# Patient Record
Sex: Male | Born: 1965
Health system: Southern US, Community
[De-identification: ages and names within clinical notes are randomized; demographics above are authoritative.]

## PROBLEM LIST (undated history)

## (undated) ENCOUNTER — Ambulatory Visit (HOSPITAL_COMMUNITY): Source: Home / Self Care

## (undated) DIAGNOSIS — I429 Cardiomyopathy, unspecified: Secondary | ICD-10-CM

## (undated) DIAGNOSIS — I219 Acute myocardial infarction, unspecified: Secondary | ICD-10-CM

## (undated) DIAGNOSIS — I671 Cerebral aneurysm, nonruptured: Secondary | ICD-10-CM

## (undated) DIAGNOSIS — I340 Nonrheumatic mitral (valve) insufficiency: Secondary | ICD-10-CM

## (undated) DIAGNOSIS — I428 Other cardiomyopathies: Secondary | ICD-10-CM

## (undated) DIAGNOSIS — R220 Localized swelling, mass and lump, head: Secondary | ICD-10-CM

## (undated) DIAGNOSIS — Q2112 Patent foramen ovale: Secondary | ICD-10-CM

## (undated) DIAGNOSIS — I639 Cerebral infarction, unspecified: Secondary | ICD-10-CM

## (undated) DIAGNOSIS — Q211 Atrial septal defect: Secondary | ICD-10-CM

## (undated) DIAGNOSIS — I63512 Cerebral infarction due to unspecified occlusion or stenosis of left middle cerebral artery: Secondary | ICD-10-CM

## (undated) DIAGNOSIS — Z720 Tobacco use: Secondary | ICD-10-CM

## (undated) DIAGNOSIS — R011 Cardiac murmur, unspecified: Secondary | ICD-10-CM

## (undated) DIAGNOSIS — I10 Essential (primary) hypertension: Secondary | ICD-10-CM

## (undated) DIAGNOSIS — I509 Heart failure, unspecified: Secondary | ICD-10-CM

## (undated) DIAGNOSIS — N182 Chronic kidney disease, stage 2 (mild): Secondary | ICD-10-CM

## (undated) HISTORY — PX: EXTERNAL EAR SURGERY: SHX627

## (undated) HISTORY — DX: Essential (primary) hypertension: I10

## (undated) HISTORY — DX: Cardiac murmur, unspecified: R01.1

## (undated) HISTORY — DX: Acute myocardial infarction, unspecified: I21.9

---

## 1997-11-13 ENCOUNTER — Emergency Department (HOSPITAL_COMMUNITY): Admission: EM | Admit: 1997-11-13 | Discharge: 1997-11-13 | Payer: Self-pay | Admitting: Emergency Medicine

## 1997-11-16 ENCOUNTER — Emergency Department (HOSPITAL_COMMUNITY): Admission: EM | Admit: 1997-11-16 | Discharge: 1997-11-16 | Payer: Self-pay | Admitting: *Deleted

## 1998-07-16 ENCOUNTER — Encounter: Payer: Self-pay | Admitting: Emergency Medicine

## 1998-07-16 ENCOUNTER — Emergency Department (HOSPITAL_COMMUNITY): Admission: EM | Admit: 1998-07-16 | Discharge: 1998-07-16 | Payer: Self-pay | Admitting: Emergency Medicine

## 2003-03-18 ENCOUNTER — Emergency Department (HOSPITAL_COMMUNITY): Admission: EM | Admit: 2003-03-18 | Discharge: 2003-03-18 | Payer: Self-pay | Admitting: *Deleted

## 2003-03-18 ENCOUNTER — Encounter: Payer: Self-pay | Admitting: *Deleted

## 2005-11-24 ENCOUNTER — Emergency Department (HOSPITAL_COMMUNITY): Admission: EM | Admit: 2005-11-24 | Discharge: 2005-11-24 | Payer: Self-pay | Admitting: Emergency Medicine

## 2013-04-12 ENCOUNTER — Encounter (HOSPITAL_COMMUNITY): Payer: Self-pay | Admitting: Emergency Medicine

## 2013-04-12 ENCOUNTER — Emergency Department (HOSPITAL_COMMUNITY)
Admission: EM | Admit: 2013-04-12 | Discharge: 2013-04-12 | Disposition: A | Discharge status: Home / Self Care | Payer: Self-pay | Attending: Emergency Medicine | Admitting: Emergency Medicine

## 2013-04-12 DIAGNOSIS — F17200 Nicotine dependence, unspecified, uncomplicated: Secondary | ICD-10-CM | POA: Insufficient documentation

## 2013-04-12 DIAGNOSIS — L0291 Cutaneous abscess, unspecified: Secondary | ICD-10-CM

## 2013-04-12 DIAGNOSIS — L0231 Cutaneous abscess of buttock: Secondary | ICD-10-CM | POA: Insufficient documentation

## 2013-04-12 MED ORDER — OXYCODONE-ACETAMINOPHEN 5-325 MG PO TABS: 1.0000 | ORAL_TABLET | Freq: Four times a day (QID) | ORAL | Status: DC | PRN

## 2013-04-12 MED ORDER — OXYCODONE-ACETAMINOPHEN 5-325 MG PO TABS
2.0000 | ORAL_TABLET | Freq: Once | ORAL | Status: AC
  Administered 2013-04-12: 2 via ORAL
  Filled 2013-04-12: qty 2

## 2013-04-12 MED ORDER — CLINDAMYCIN HCL 300 MG PO CAPS
300.0000 mg | ORAL_CAPSULE | Freq: Once | ORAL | Status: AC
  Administered 2013-04-12: 300 mg via ORAL
  Filled 2013-04-12 (×2): qty 1

## 2013-04-12 MED ORDER — CLINDAMYCIN HCL 150 MG PO CAPS: 150.0000 mg | ORAL_CAPSULE | Freq: Four times a day (QID) | ORAL | Status: DC

## 2013-04-12 MED ORDER — ONDANSETRON 4 MG PO TBDP
4.0000 mg | ORAL_TABLET | Freq: Once | ORAL | Status: AC
  Administered 2013-04-12: 4 mg via ORAL
  Filled 2013-04-12: qty 1

## 2013-04-12 NOTE — ED Provider Notes (Signed)
CSN: [id number]     Arrival date & time 04/12/13  1431 History   First MD Initiated Contact with Patient 04/12/13 1733     Chief Complaint  Patient presents with  . Abscess   (Consider location/radiation/quality/duration/timing/severity/associated sxs/prior Treatment) HPI  Lillard J Medlen is a 47 y.o.male without any significant PMH presents to the ER with complaints of abscess to left glutaeus.  He has had it for 1 week and delayed presentation because he does not know what it is or how he got it. The abscess is open and actively draining blood and puss. He says he is otherwise healthy without diabetes or HIV. He denies having fevers, nausea, vomiting, diarrhea.    History reviewed. No pertinent past medical history. History reviewed. No pertinent past surgical history. History reviewed. No pertinent family history. History  Substance Use Topics  . Smoking status: Current Every Day Smoker  . Smokeless tobacco: Not on file  . Alcohol Use: No    Review of Systems The patient denies anorexia, fever, weight loss,, vision loss, decreased hearing, hoarseness, chest pain, syncope, dyspnea on exertion, peripheral edema, balance deficits, hemoptysis, abdominal pain, melena, hematochezia, severe indigestion/heartburn, hematuria, incontinence, genital sores, muscle weakness, suspicious skin lesions, transient blindness, difficulty walking, depression, unusual weight change, abnormal bleeding, enlarged lymph nodes, angioedema, and breast masses.  Allergies  Review of patient's allergies indicates no known allergies.  Home Medications  No current outpatient prescriptions on file. BP 149/91  Pulse 85  Temp(Src) 98.3 F (36.8 C)  Resp 18  Ht 5\' 11"  (1.803 m)  Wt 160 lb (72.576 kg)  BMI 22.33 kg/m2  SpO2 100% Physical Exam  Nursing note and vitals reviewed. Constitutional: He appears well-developed and well-nourished. No distress.  HENT:  Head: Normocephalic and atraumatic.  Eyes:  Pupils are equal, round, and reactive to light.  Neck: Normal range of motion. Neck supple.  Cardiovascular: Normal rate and regular rhythm.   Pulmonary/Chest: Effort normal.  Abdominal: Soft.  Genitourinary:     Neurological: He is alert.  Skin: Skin is warm and dry.    ED Course  Procedures (including critical care time) Labs Review Labs Reviewed - No data to display Imaging Review No results found.  EKG Interpretation   None       MDM   1. Abscess    Puss expressed through open abscess. Will give Rx for clindamycin and percocet.  Patient is not having symptoms of being a diabetic, weight loss, fevers/chills, urinary frequency. He is not febrile.  47 y.o.Daud J Hendricks's evaluation in the Emergency Department is complete. It has been determined that no acute conditions requiring further emergency intervention are present at this time. The patient/guardian have been advised of the diagnosis and plan. We have discussed signs and symptoms that warrant return to the ED, such as changes or worsening in symptoms.  Vital signs are stable at discharge. Filed Vitals:   04/12/13 1727  BP: 149/91  Pulse: 85  Temp:   Resp:     Patient/guardian has voiced understanding and agreed to follow-up with the PCP or specialist.     Dorthula Matas, PA-C 04/12/13 1818

## 2013-04-12 NOTE — ED Notes (Signed)
Per pt sts abscess in rectal area that is draining and bleeding.

## 2013-04-16 NOTE — ED Provider Notes (Signed)
Medical screening examination/treatment/procedure(s) were performed by non-physician practitioner and as supervising physician I was immediately available for consultation/collaboration.  EKG Interpretation   None        Eiko Mcgowen, MD 04/16/13 0909 

## 2014-01-15 ENCOUNTER — Telehealth: Payer: Self-pay

## 2014-09-26 ENCOUNTER — Encounter (HOSPITAL_COMMUNITY): Payer: Self-pay | Admitting: *Deleted

## 2014-09-26 ENCOUNTER — Emergency Department (INDEPENDENT_AMBULATORY_CARE_PROVIDER_SITE_OTHER)
Admission: EM | Admit: 2014-09-26 | Discharge: 2014-09-26 | Disposition: A | Discharge status: Home / Self Care | Payer: Self-pay | Source: Home / Self Care | Attending: Family Medicine | Admitting: Family Medicine

## 2014-09-26 DIAGNOSIS — L0231 Cutaneous abscess of buttock: Secondary | ICD-10-CM

## 2014-09-26 MED ORDER — SULFAMETHOXAZOLE-TRIMETHOPRIM 800-160 MG PO TABS: 1.0000 | ORAL_TABLET | Freq: Two times a day (BID) | ORAL | Status: AC

## 2014-09-26 NOTE — Discharge Instructions (Signed)
Warm tub soak 1-2 times a day, leave packing in place, return on sun for removal. Take antibiotic.

## 2014-09-26 NOTE — ED Notes (Signed)
Pt is here with complaints of boil on buttocks.

## 2014-09-26 NOTE — ED Provider Notes (Signed)
CSN: [id number]     Arrival date & time 09/26/14  1018 History   First MD Initiated Contact with Patient 09/26/14 1035     Chief Complaint  Patient presents with  . Recurrent Skin Infections   (Consider location/radiation/quality/duration/timing/severity/associated sxs/prior Treatment) Patient is a 49 y.o. male presenting with abscess. The history is provided by the patient and a friend.  Abscess Location:  Ano-genital Ano-genital abscess location:  L buttock Abscess quality: fluctuance, induration and painful   Red streaking: no   Duration:  5 days Progression:  Worsening Pain details:    Quality:  Throbbing   Progression:  Worsening Chronicity:  Chronic Associated symptoms: fever and nausea     History reviewed. No pertinent past medical history. Past Surgical History  Procedure Laterality Date  . External ear surgery     History reviewed. No pertinent family history. History  Substance Use Topics  . Smoking status: Current Every Day Smoker  . Smokeless tobacco: Not on file  . Alcohol Use: No    Review of Systems  Constitutional: Positive for fever.  Gastrointestinal: Positive for nausea.  Skin: Positive for rash.    Allergies  Review of patient's allergies indicates no known allergies.  Home Medications   Prior to Admission medications   Medication Sig Start Date End Date Taking? Authorizing Provider  clindamycin (CLEOCIN) 150 MG capsule Take 1 capsule (150 mg total) by mouth every 6 (six) hours. 04/12/13   Marlon Pel, PA-C  oxyCODONE-acetaminophen (PERCOCET/ROXICET) 5-325 MG per tablet Take 1 tablet by mouth every 6 (six) hours as needed for severe pain. 04/12/13   Tiffany Neva Seat, PA-C  sulfamethoxazole-trimethoprim (BACTRIM DS,SEPTRA DS) 800-160 MG per tablet Take 1 tablet by mouth 2 (two) times daily. 09/26/14 10/03/14  Linna Hoff, MD   BP 135/88 mmHg  Pulse 99  Temp(Src) 98.6 F (37 C) (Oral)  Resp 16  SpO2 100% Physical Exam  Constitutional: He  appears well-developed and well-nourished. He appears distressed.  Neurological: He is alert.  Skin: Skin is warm and dry.  Tender left gluteal abscess  Nursing note and vitals reviewed.   ED Course  INCISION AND DRAINAGE Date/Time: 09/26/2014 11:08 AM Performed by: Linna Hoff Authorized by: Bradd Canary D Consent: Verbal consent obtained. Consent given by: patient Type: abscess Body area: anogenital Location details: perianal Local anesthetic: topical anesthetic Patient sedated: no Scalpel size: 11 Incision type: single straight Complexity: simple Drainage: purulent Drainage amount: copious Wound treatment: drain placed Packing material: 1/4 in iodoform gauze Patient tolerance: Patient tolerated the procedure well with no immediate complications Comments: Culture obtained.   (including critical care time) Labs Review Labs Reviewed  CULTURE, ROUTINE-ABSCESS    Imaging Review No results found.   MDM   1. Abscess, gluteal, left    Left gluteal abscess i+d with packing.    Linna Hoff, MD 09/26/14 1116

## 2014-09-28 ENCOUNTER — Emergency Department (INDEPENDENT_AMBULATORY_CARE_PROVIDER_SITE_OTHER)
Admission: EM | Admit: 2014-09-28 | Discharge: 2014-09-28 | Disposition: A | Discharge status: Home / Self Care | Payer: Self-pay | Source: Home / Self Care

## 2014-09-28 ENCOUNTER — Encounter (HOSPITAL_COMMUNITY): Payer: Self-pay | Admitting: *Deleted

## 2014-09-28 DIAGNOSIS — Z09 Encounter for follow-up examination after completed treatment for conditions other than malignant neoplasm: Secondary | ICD-10-CM

## 2014-09-28 NOTE — ED Notes (Signed)
Pt presents for follow-up S/P I&D buttock abscess 2 days ago.  States packing fell out.  Feels area is getting less swollen.  Has been taking abx as directed.

## 2014-09-28 NOTE — Discharge Instructions (Signed)
Continue care and treatment of incision as you have been.  It should heal nicely on its own.  If you develop any fever or foul smelling draining return for further evaluation.  Dressing Change A dressing is a material placed over wounds. It keeps the wound clean, dry, and protected from further injury. This provides an environment that favors wound healing.  BEFORE YOU BEGIN  Get your supplies together. Things you may need include:  Saline solution.  Flexible gauze dressing.  Medicated cream.  Tape.  Gloves.  Abdominal dressing pads.  Gauze squares.  Plastic bags.  Take pain medicine 30 minutes before the dressing change if you need it.  Take a shower before you do the first dressing change of the day. Use plastic wrap or a plastic bag to prevent the dressing from getting wet. REMOVING YOUR OLD DRESSING   Wash your hands with soap and water. Dry your hands with a clean towel.  Put on your gloves.  Remove any tape.  Carefully remove the old dressing. If the dressing sticks, you may dampen it with warm water to loosen it, or follow your caregiver's specific directions.  Remove any gauze or packing tape that is in your wound.  Take off your gloves.  Put the gloves, tape, gauze, or any packing tape into a plastic bag. CHANGING YOUR DRESSING  Open the supplies.  Take the cap off the saline solution.  Open the gauze package so that the gauze remains on the inside of the package.  Put on your gloves.  Clean your wound as told by your caregiver.  If you have been told to keep your wound dry, follow those instructions.  Your caregiver may tell you to do one or more of the following:  Pick up the gauze. Pour the saline solution over the gauze. Squeeze out the extra saline solution.  Put medicated cream or other medicine on your wound if you have been told to do so.  Put the solution soaked gauze only in your wound, not on the skin around it.  Pack your wound  loosely or as told by your caregiver.  Put dry gauze on your wound.  Put abdominal dressing pads over the dry gauze if your wet gauze soaks through.  Tape the abdominal dressing pads in place so they will not fall off. Do not wrap the tape completely around the affected part (arm, leg, abdomen).  Wrap the dressing pads with a flexible gauze dressing to secure it in place.  Take off your gloves. Put them in the plastic bag with the old dressing. Tie the bag shut and throw it away.  Keep the dressing clean and dry until your next dressing change.  Wash your hands. SEEK MEDICAL CARE IF:  Your skin around the wound looks red.  Your wound feels more tender or sore.  You see pus in the wound.  Your wound smells bad.  You have a fever.  Your skin around the wound has a rash that itches and burns.  You see black or yellow skin in your wound that was not there before.  You feel nauseous, throw up, and feel very tired. Document Released: 06/30/2004 Document Revised: 08/15/2011 Document Reviewed: 04/04/2011 [facility] Patient Information 2015 [place], [place]. This information is not intended to replace advice given to you by your health care provider. Make sure you discuss any questions you have with your health care provider.

## 2014-09-28 NOTE — ED Provider Notes (Signed)
CSN: [id number]     Arrival date & time 09/28/14  3202 History   None    Chief Complaint  Patient presents with  . Follow-up   (Consider location/radiation/quality/duration/timing/severity/associated sxs/prior Treatment)  HPI   The patient is a male presenting today for wound recheck following an incision and drainage of an abscess on his buttock 2 days ago by Dr. Penni Bombard. Patient states he's been taking his antibiotic does not believe he has had any fevers but occasionally says he gets the sweats. He states it feels "much better. Has been keeping some 4 x 4's between his gluteal cleft for any drainage as denied any further pus since initial drainage. The patient states the wound packing came out on its own.  History reviewed. No pertinent past medical history. Past Surgical History  Procedure Laterality Date  . External ear surgery     No family history on file. History  Substance Use Topics  . Smoking status: Current Every Day Smoker  . Smokeless tobacco: Not on file  . Alcohol Use: No    Review of Systems  Constitutional: Positive for diaphoresis. Negative for fever and fatigue.  HENT: Negative.   Eyes: Negative.   Respiratory: Negative.   Cardiovascular: Negative.   Gastrointestinal: Negative.   Endocrine: Negative.   Genitourinary: Negative.   Musculoskeletal: Negative.   Skin: Positive for wound. Negative for color change, pallor and rash.  Allergic/Immunologic: Negative.   Neurological: Negative.   Hematological: Negative.   Psychiatric/Behavioral: Negative.     Allergies  Review of patient's allergies indicates no known allergies.  Home Medications   Prior to Admission medications   Medication Sig Start Date End Date Taking? Authorizing Provider  sulfamethoxazole-trimethoprim (BACTRIM DS,SEPTRA DS) 800-160 MG per tablet Take 1 tablet by mouth 2 (two) times daily. 09/26/14 10/03/14 Yes Linna Hoff, MD  clindamycin (CLEOCIN) 150 MG capsule Take 1 capsule (150  mg total) by mouth every 6 (six) hours. 04/12/13   Marlon Pel, PA-C  oxyCODONE-acetaminophen (PERCOCET/ROXICET) 5-325 MG per tablet Take 1 tablet by mouth every 6 (six) hours as needed for severe pain. 04/12/13   Tiffany Neva Seat, PA-C   BP 120/81 mmHg  Pulse 114  Temp(Src) 98.4 F (36.9 C) (Oral)  Resp 16  SpO2 99%   Physical Exam  Constitutional: He appears well-developed and well-nourished. No distress.  Cardiovascular: Normal rate, normal heart sounds and intact distal pulses.  Exam reveals no gallop and no friction rub.   No murmur heard. Pulmonary/Chest: Effort normal and breath sounds normal. No respiratory distress. He has no wheezes. He has no rales. He exhibits no tenderness.  Skin: He is not diaphoretic.  Incisional area is healing nicely. Area is nonfluctuant and firm. Unable to express any purulent drainage.  A small amount of serosanguineous drainage still present and there is no evidence of wound packing present. No erythema or heat noticed in surrounding healing incision.  Nursing note and vitals reviewed.   ED Course  Procedures (including critical care time) Labs Review Labs Reviewed - No data to display  Imaging Review No results found.   MDM   1. Encounter for recheck of abscess following incision and drainage    The patient is to continue as he is doing with wound care and management. Patient advised return should any signs or symptoms of fever and infection develop. The patient verbalizes understanding and agrees to plan of care.        Servando Salina, NP 09/28/14 1134

## 2014-09-30 LAB — CULTURE, ROUTINE-ABSCESS: Special Requests: NORMAL

## 2015-09-01 ENCOUNTER — Encounter (HOSPITAL_COMMUNITY): Payer: Self-pay | Admitting: Emergency Medicine

## 2015-09-01 ENCOUNTER — Emergency Department (HOSPITAL_COMMUNITY)
Admission: EM | Admit: 2015-09-01 | Discharge: 2015-09-01 | Disposition: A | Discharge status: Home / Self Care | Payer: Self-pay | Attending: Emergency Medicine | Admitting: Emergency Medicine

## 2015-09-01 DIAGNOSIS — L0291 Cutaneous abscess, unspecified: Secondary | ICD-10-CM

## 2015-09-01 DIAGNOSIS — F17200 Nicotine dependence, unspecified, uncomplicated: Secondary | ICD-10-CM | POA: Insufficient documentation

## 2015-09-01 DIAGNOSIS — L0231 Cutaneous abscess of buttock: Secondary | ICD-10-CM | POA: Insufficient documentation

## 2015-09-01 DIAGNOSIS — Z792 Long term (current) use of antibiotics: Secondary | ICD-10-CM | POA: Insufficient documentation

## 2015-09-01 MED ORDER — SULFAMETHOXAZOLE-TRIMETHOPRIM 800-160 MG PO TABS: 1.0000 | ORAL_TABLET | Freq: Two times a day (BID) | ORAL | Status: AC

## 2015-09-01 MED ORDER — LIDOCAINE HCL (PF) 1 % IJ SOLN
20.0000 mL | Freq: Once | INTRAMUSCULAR | Status: AC
  Administered 2015-09-01: 20 mL via INTRADERMAL
  Filled 2015-09-01: qty 20

## 2015-09-01 NOTE — ED Notes (Signed)
Pt states "I have a boil on my butt, last time they squeezed it out, its been going on about a week".

## 2015-09-01 NOTE — ED Provider Notes (Signed)
CSN: [id number]     Arrival date & time 09/01/15  1603 History  By signing my name below, I, David [name], attest that this documentation has been prepared under the direction and in the presence of [name], PA-C.  Electronically Signed: [name], ED Scribe 09/01/2015 at 4:58 PM.  Chief Complaint  Patient presents with  . Abscess    The history is provided by the patient. No language interpreter was used.    HPI Comments: David [name] is a 50 y.o. male who presents to the Emergency Department complaining of gradual onset, abscess on his left buttock, onset about one week ago. Pt reports associated pain to the area. The pain is worsened when he sits or puts pressure on the area. No other worsening or alleviating factors noted. Pt denies fever, chills, nausea, vomiting, abdominal pain, drainage, or any other pertinent symptoms.   History reviewed. No pertinent past medical history. Past Surgical History  Procedure Laterality Date  . External ear surgery     No family history on file. Social History  Substance Use Topics  . Smoking status: Current Every Day Smoker  . Smokeless tobacco: None  . Alcohol Use: No      Review of Systems  Constitutional: Negative for fever and chills.  Gastrointestinal: Negative for nausea, vomiting and abdominal pain.  Skin:       Abscess    Allergies  Review of patient's allergies indicates no known allergies.  Home Medications   Prior to Admission medications   Medication Sig Start Date End Date Taking? Authorizing Provider  clindamycin (CLEOCIN) 150 MG capsule Take 1 capsule (150 mg total) by mouth every 6 (six) hours. 04/12/13   Marlon Pel, PA-C  oxyCODONE-acetaminophen (PERCOCET/ROXICET) 5-325 MG per tablet Take 1 tablet by mouth every 6 (six) hours as needed for severe pain. 04/12/13   Tiffany Neva Seat, PA-C    BP 157/89 mmHg  Pulse 88  Temp(Src) 99.1 F (37.3 C) (Oral)  Resp 18  SpO2 97% Physical Exam   Constitutional: He is oriented to person, place, and time. He appears well-developed and well-nourished. No distress.  HENT:  Head: Normocephalic and atraumatic.  Right Ear: External ear normal.  Left Ear: External ear normal.  Nose: Nose normal.  Eyes: Conjunctivae and EOM are normal. Right eye exhibits no discharge. Left eye exhibits no discharge. No scleral icterus.  Neck: Normal range of motion. Neck supple.  Cardiovascular: Normal rate and regular rhythm.   Pulmonary/Chest: Effort normal and breath sounds normal. No respiratory distress.  Genitourinary:     2 cm area of fluctuance to left buttock with surrounding induration.  Musculoskeletal: Normal range of motion. He exhibits no edema or tenderness.  Neurological: He is alert and oriented to person, place, and time.  Skin: Skin is warm and dry. He is not diaphoretic.  Psychiatric: He has a normal mood and affect. His behavior is normal.  Nursing note and vitals reviewed.   ED Course  Procedures (including critical care time)  DIAGNOSTIC STUDIES: Oxygen Saturation is 97% on RA, normal by my interpretation.    COORDINATION OF CARE: 4:35 PM-Discussed treatment plan which includes incision and drainage with pt at bedside and pt agreed to plan.   INCISION AND DRAINAGE Performed by: Dorise Hiss. Deontez Klinke, PA-C. Consent: verbal consent obtained. Risks and benefits: risks, benefits and alternatives were discussed Sterile Prep and Drape Type: abscess Body area: left buttock Anesthesia:  Local anesthetic: lidocaine 1% without epinephrine Anesthetic total: 20 ml Incision:  11 blade Complexity: complex Blunt dissection to breakup loculations Drainage: purulent  Drainage amount: moderate  Flushed with copious amount of sterile saline Patient tolerance: patient tolerated the procedure well with no immediate complications.   Labs Review Labs Reviewed - No data to display  Imaging Review No results found.    EKG  Interpretation None      MDM   Final diagnoses:  Abscess    50 year old male presents with abscess to his left buttock x 1 week. Reports pain. Denies drainage. Patient is afebrile. Vital signs stable. On exam, patient has a 2 cm area of fluctuance with surrounding induration to his left buttock. Incision and drainage performed in the ED, which the patient tolerated well. Will discharge with bactrim. Advised to use warm compresses. Patient to follow-up in 2 days for wound recheck. Return precautions discussed. Patient verbalizes his understanding and is in agreement with plan.  BP 157/89 mmHg  Pulse 88  Temp(Src) 99.1 F (37.3 C) (Oral)  Resp 18  SpO2 97%  I personally performed the services described in this documentation, which was scribed in my presence. The recorded information has been reviewed and is accurate.    [name], PA-C 09/01/15 1701  [name], MD 09/01/15 2018

## 2015-09-01 NOTE — Discharge Instructions (Signed)
1. Medications: bactrim, usual home medications 2. Treatment: rest, drink plenty of fluids, use warm compresses 3. Follow Up: please followup with this department in 2 days for wound recheck and for discussion of your diagnoses and further evaluation after today's visit; if you do not have a primary care doctor use the phone number listed in your discharge paperwork to find one; please return to the ER for fever, chills, increased pain/redness/swelling, new or worsening symptoms   Abscess An abscess (boil or furuncle) is an infected area on or under the skin. This area is filled with yellowish-white fluid (pus) and other material (debris). HOME CARE   Only take medicines as told by your doctor.  If you were given antibiotic medicine, take it as directed. Finish the medicine even if you start to feel better.  If gauze is used, follow your doctor's directions for changing the gauze.  To avoid spreading the infection:  Keep your abscess covered with a bandage.  Wash your hands well.  Do not share personal care items, towels, or whirlpools with others.  Avoid skin contact with others.  Keep your skin and clothes clean around the abscess.  Keep all doctor visits as told. GET HELP RIGHT AWAY IF:   You have more pain, puffiness (swelling), or redness in the wound site.  You have more fluid or blood coming from the wound site.  You have muscle aches, chills, or you feel sick.  You have a fever. MAKE SURE YOU:   Understand these instructions.  Will watch your condition.  Will get help right away if you are not doing well or get worse.   This information is not intended to replace advice given to you by your health care provider. Make sure you discuss any questions you have with your health care provider.   Document Released: 11/09/2007 Document Revised: 11/22/2011 Document Reviewed: 08/06/2011 Elsevier Interactive Patient Education [employer]

## 2016-08-27 ENCOUNTER — Encounter (HOSPITAL_COMMUNITY): Payer: Self-pay | Admitting: Emergency Medicine

## 2016-08-27 ENCOUNTER — Emergency Department (HOSPITAL_COMMUNITY)
Admission: EM | Admit: 2016-08-27 | Discharge: 2016-08-27 | Disposition: A | Discharge status: Home / Self Care | Payer: BLUE CROSS/BLUE SHIELD | Attending: Emergency Medicine | Admitting: Emergency Medicine

## 2016-08-27 DIAGNOSIS — Z79899 Other long term (current) drug therapy: Secondary | ICD-10-CM | POA: Diagnosis not present

## 2016-08-27 DIAGNOSIS — M791 Myalgia: Secondary | ICD-10-CM | POA: Diagnosis present

## 2016-08-27 DIAGNOSIS — F17200 Nicotine dependence, unspecified, uncomplicated: Secondary | ICD-10-CM | POA: Diagnosis not present

## 2016-08-27 DIAGNOSIS — L0501 Pilonidal cyst with abscess: Secondary | ICD-10-CM | POA: Diagnosis not present

## 2016-08-27 MED ORDER — HYDROCODONE-ACETAMINOPHEN 5-325 MG PO TABS: 1.0000 | ORAL_TABLET | ORAL | 0 refills | Status: DC | PRN

## 2016-08-27 MED ORDER — LIDOCAINE HCL 2 % IJ SOLN
10.0000 mL | Freq: Once | INTRAMUSCULAR | Status: AC
  Administered 2016-08-27: 200 mg
  Filled 2016-08-27: qty 20

## 2016-08-27 MED ORDER — CEPHALEXIN 500 MG PO CAPS: 500.0000 mg | ORAL_CAPSULE | Freq: Four times a day (QID) | ORAL | 0 refills | Status: DC

## 2016-08-27 NOTE — ED Triage Notes (Signed)
Onset 2-3 days ago developed a "boil" left buttocks. States pain increasing along with size. Currently 7/10 achy sore.

## 2016-08-27 NOTE — ED Notes (Signed)
Declined W/C at D/C and was escorted to lobby by RN. 

## 2016-08-27 NOTE — ED Triage Notes (Signed)
PT provided with warm blankets but refused.

## 2016-08-27 NOTE — ED Provider Notes (Signed)
[facility]EMERGENCY DEPT Provider Note   CSN: [id number] Arrival date & time: 08/27/16  0945   By signing my name below, I, Marnette Burgess Long, attest that this documentation has been prepared under the direction and in the presence of Burgess Amor, PA-C. Electronically Signed: Marnette Burgess Long, Scribe. 08/27/2016. 11:06 AM.   History   Chief Complaint Chief Complaint  Patient presents with  . Recurrent Skin Infections   The history is provided by the patient and medical records. No language interpreter was used.   HPI Comments: David [name] is a 51 y.o. male with no pertinent PMHx, who presents to the Emergency Department complaining of a moderate, gradually worsening area of 7/10, aching pain with swelling to the upper left buttocks onset about three to four days ago. He reports he has had this type of "boil" before in a similar area that was drained at that time. Per chart review, he has three past I&D's for abscesses in  the same area on 04/12/13, 09/26/14, and 09/01/15. Pt states pain is exacerbated while sitting and with palpation and direct pressure. He notes he is quite anxious at the moment as hospitals scare him. He tried soap, water, and epsom salt at home with no relief of the swelling. Denies fever, chills, drainage from the area. Pt is a current every day smoker. Pt does not currently have a PCP.   History reviewed. No pertinent past medical history.  There are no active problems to display for this patient.  Past Surgical History:  Procedure Laterality Date  . EXTERNAL EAR SURGERY      Home Medications    Prior to Admission medications   Medication Sig Start Date End Date Taking? Authorizing Provider  cephALEXin (KEFLEX) 500 MG capsule Take 1 capsule (500 mg total) by mouth 4 (four) times daily. 08/27/16   Burgess Amor, PA-C  clindamycin (CLEOCIN) 150 MG capsule Take 1 capsule (150 mg total) by mouth every 6 (six) hours. 04/12/13   Marlon Pel, PA-C  HYDROcodone-acetaminophen  (NORCO/VICODIN) 5-325 MG tablet Take 1 tablet by mouth every 4 (four) hours as needed for moderate pain. 08/27/16   Burgess Amor, PA-C  oxyCODONE-acetaminophen (PERCOCET/ROXICET) 5-325 MG per tablet Take 1 tablet by mouth every 6 (six) hours as needed for severe pain. 04/12/13   Marlon Pel, PA-C    Family History No family history on file.  Social History Social History  Substance Use Topics  . Smoking status: Current Every Day Smoker  . Smokeless tobacco: Never Used  . Alcohol use No     Allergies   Patient has no known allergies.   Review of Systems Review of Systems  Constitutional: Negative for chills and fever.  HENT: Negative.   Gastrointestinal: Negative for abdominal pain, nausea and vomiting.  Musculoskeletal: Negative for myalgias.  Skin:       Boil to left buttocks  Neurological: Negative for dizziness.     Physical Exam Updated Vital Signs BP (!) 147/75 (BP Location: Left Arm)   Pulse 69   Temp 98.3 F (36.8 C)   Resp 17   Ht 5\' 10"  (1.778 m)   Wt 77.1 kg   SpO2 100%   BMI 24.39 kg/m   Physical Exam  Constitutional: He is oriented to person, place, and time. He appears well-developed and well-nourished.  HENT:  Head: Normocephalic.  Eyes: Conjunctivae are normal.  Cardiovascular: Normal rate.   Pulmonary/Chest: Effort normal.  Abdominal: He exhibits no distension.  Musculoskeletal: Normal range of motion.  Neurological: He is alert and oriented to person, place, and time.  Skin: Skin is warm and dry.  2cm fluctuant abscess at his left upper buttock near the midline. There is no surrounding erythema or red streaking. No drainage.   Psychiatric: He has a normal mood and affect.  Nursing note and vitals reviewed.   ED Treatments / Results  DIAGNOSTIC STUDIES:  Oxygen Saturation is 99% on RA, normal by my interpretation.    COORDINATION OF CARE:  11:02 AM Discussed treatment plan with pt at bedside including I&D and pt agreed to  plan.  Labs (all labs ordered are listed, but only abnormal results are displayed) Labs Reviewed - No data to display  EKG  EKG Interpretation None       Radiology No results found.  Procedures Procedures  INCISION AND DRAINAGE PROCEDURE NOTE: Patient identification was confirmed and verbal consent was obtained. This procedure was performed by Burgess Amor, PA-C at 11:25 AM. Site: Upper left buttock near midline Sterile procedures observed with betadine Needle size: 25gauge Anesthetic used (type and amt): 2% Lidocaine without epi, 2cc Blade size: 11 Drainage: Copious amount of purulent drainage Complexity: Complex Site anesthetized, incision made over site, wound drained and explored loculations, rinsed with copious amounts of normal saline, covered with dry, sterile dressing.  Pt tolerated procedure well without complications.  Instructions for care discussed verbally and pt provided with additional written instructions for homecare and f/u.   Medications Ordered in ED Medications  lidocaine (XYLOCAINE) 2 % (with pres) injection 200 mg (200 mg Other Given 08/27/16 1111)     Initial Impression / Assessment and Plan / ED Course  I have reviewed the triage vital signs and the nursing notes.  Pertinent labs & imaging results that were available during my care of the patient were reviewed by me and considered in my medical decision making (see chart for details).     Patient with skin abscess. Incision and drainage performed in the ED today.  Abscess was not large enough to warrant packing or drain placement. Wound recheck in 2 days. Supportive care and return precautions discussed.  Pt sent home with keflex, hydrocodone. The patient appears reasonably screened and/or stabilized for discharge and I doubt any other emergent medical condition requiring further screening, evaluation, or treatment in the ED prior to discharge.    Final Clinical Impressions(s) / ED Diagnoses    Final diagnoses:  Pilonidal cyst with abscess    New Prescriptions Discharge Medication List as of 08/27/2016 11:57 AM    START taking these medications   Details  cephALEXin (KEFLEX) 500 MG capsule Take 1 capsule (500 mg total) by mouth 4 (four) times daily., Starting Sat 08/27/2016, Print    HYDROcodone-acetaminophen (NORCO/VICODIN) 5-325 MG tablet Take 1 tablet by mouth every 4 (four) hours as needed for moderate pain., Starting Sat 08/27/2016, Print       I personally performed the services described in this documentation, which was scribed in my presence. The recorded information has been reviewed and is accurate.     Burgess Amor, PA-C 08/27/16 1230    Canary Brim Tegeler, MD 08/27/16 2019

## 2016-08-27 NOTE — Discharge Instructions (Signed)
Continue to do a warm water soak at least twice daily for 10 minutes to keep this site open and draining.  Complete your entire course of antibiotics.  Get rechecked for any new or worsening symptoms.  You may take the hydrocodone prescribed for pain relief.  This will make you drowsy - do not drive within 4 hours of taking this medication.

## 2016-12-04 ENCOUNTER — Emergency Department (HOSPITAL_COMMUNITY)
Admission: EM | Admit: 2016-12-04 | Discharge: 2016-12-04 | Disposition: A | Discharge status: Home / Self Care | Payer: BLUE CROSS/BLUE SHIELD | Attending: Emergency Medicine | Admitting: Emergency Medicine

## 2016-12-04 ENCOUNTER — Encounter (HOSPITAL_COMMUNITY): Payer: Self-pay | Admitting: *Deleted

## 2016-12-04 DIAGNOSIS — L84 Corns and callosities: Secondary | ICD-10-CM | POA: Insufficient documentation

## 2016-12-04 DIAGNOSIS — M79671 Pain in right foot: Secondary | ICD-10-CM | POA: Insufficient documentation

## 2016-12-04 NOTE — ED Provider Notes (Signed)
[facility] Provider Note    By signing my name below, I, Earmon Phoenix, attest that this documentation has been prepared under the direction and in the presence of Graciella Freer, PA-C. Electronically Signed: Earmon Phoenix, ED Scribe. 12/04/16. 10:21 AM.    History   Chief Complaint Chief Complaint  Patient presents with  . Foot Pain    The history is provided by the patient and medical records. No language interpreter was used.    David [name] is a 51 y.o. male who presents to the Emergency Department complaining of lateral right foot pain that began several weeks ago. He states he used to wear steel toed boots that started his pain. He was told by a shoe salesman that he needed to wear wider shoes. He states he recently switched to wearing wide width shoes yesterday so he is unsure of any relief from that. He has been taking a previously prescribed pain reliever with moderate relief. He denies any trauma or injury. Walking and bearing weight increases the pain He denies numbness, tingling or weakness of the right foot, leg or toes, bruising, wounds, numbness, tingling or weakness. He denies trauma, injury or fall.    History reviewed. No pertinent past medical history.  There are no active problems to display for this patient.   Past Surgical History:  Procedure Laterality Date  . EXTERNAL EAR SURGERY         Home Medications    Prior to Admission medications   Medication Sig Start Date End Date Taking? Authorizing Provider  cephALEXin (KEFLEX) 500 MG capsule Take 1 capsule (500 mg total) by mouth 4 (four) times daily. 08/27/16   Burgess Amor, PA-C  clindamycin (CLEOCIN) 150 MG capsule Take 1 capsule (150 mg total) by mouth every 6 (six) hours. 04/12/13   Marlon Pel, PA-C  HYDROcodone-acetaminophen (NORCO/VICODIN) 5-325 MG tablet Take 1 tablet by mouth every 4 (four) hours as needed for moderate pain. 08/27/16   Burgess Amor, PA-C  oxyCODONE-acetaminophen  (PERCOCET/ROXICET) 5-325 MG per tablet Take 1 tablet by mouth every 6 (six) hours as needed for severe pain. 04/12/13   Marlon Pel, PA-C    Family History History reviewed. No pertinent family history.  Social History Social History  Substance Use Topics  . Smoking status: Current Every Day Smoker  . Smokeless tobacco: Never Used  . Alcohol use No     Allergies   Patient has no known allergies.   Review of Systems Review of Systems  Musculoskeletal: Positive for arthralgias.  Skin: Negative for color change and wound.  Neurological: Negative for weakness and numbness.     Physical Exam Updated Vital Signs BP (!) 154/93 (BP Location: Right Arm)   Pulse 71   Temp 98.4 F (36.9 C) (Oral)   Resp 18   SpO2 100%   Physical Exam  Constitutional: He appears well-developed and well-nourished.  Appears anxious but in no acute distress.   HENT:  Head: Normocephalic and atraumatic.  Eyes: Conjunctivae and EOM are normal. Right eye exhibits no discharge. Left eye exhibits no discharge. No scleral icterus.  Cardiovascular:  Dorsalis Pedis pulses 2+ bilaterally.  Pulmonary/Chest: Effort normal.  Musculoskeletal:  Full ROM of right ankle and toes of right foot. TTP to the lateral aspect just below 5th digit where there is an obvious callused area. Tenderness palpation overlying the callused area of the lateral foot. No surrounding warmth or erythema.  Neurological: He is alert.  Skin: Skin is warm and dry. Capillary refill  takes less than 2 seconds.  Right foot and ankle has no dusky appearance and is not cool to touch. No open wounds, abrasions or lacerations.   Psychiatric: He has a normal mood and affect. His speech is normal and behavior is normal.  Nursing note and vitals reviewed.    ED Treatments / Results  DIAGNOSTIC STUDIES: Oxygen Saturation is 100% on RA, normal by my interpretation.   COORDINATION OF CARE: 10:13 AM- Will refer to podiatry. Pt verbalizes  understanding and agrees to plan.  Medications - No data to display  Labs (all labs ordered are listed, but only abnormal results are displayed) Labs Reviewed - No data to display  EKG  EKG Interpretation None       Radiology No results found.  Procedures Procedures (including critical care time)  Medications Ordered in ED Medications - No data to display   Initial Impression / Assessment and Plan / ED Course  I have reviewed the triage vital signs and the nursing notes.  Pertinent labs & imaging results that were available during my care of the patient were reviewed by me and considered in my medical decision making (see chart for details).     51 year old male who presents with right foot pain. This pain has been ongoing for a few weeks. Patient is afebrile, non-toxic appearing, sitting comfortably on examination table. Vital signs reviewed. Patient is slightly hypertensive, likely secondary to pain. Patient is neurovascularly intact. Pain likely coming from the callus that is located on the lateral aspect of his foot. No evidence of deformity or crepitus. History/physical exam are not concerning for any fracture or dislocation. Last physical exam or not concerning for an acute arterial embolism or vascular cause of pain. Will plan to send patient to podiatry for evaluation of the callus and possible removal. Plan discussed with patient. Patient agrees with plan. Instructed patient to establish primary care doctor for evaluation of blood pressure. Strict return precautions discussed. Patient's breasts understanding and agreement plan.   Final Clinical Impressions(s) / ED Diagnoses   Final diagnoses:  Foot pain, right  Foot callus    New Prescriptions New Prescriptions   No medications on file    I personally performed the services described in this documentation, which was scribed in my presence. The recorded information has been reviewed and is accurate.       Maxwell Caul, PA-C 12/04/16 1028    Doug Sou, MD 12/04/16 1740

## 2016-12-04 NOTE — Discharge Instructions (Signed)
You can take Tylenol or Ibuprofen as directed for pain.  Continue wearing the wider shoes until you're able to be seen by the doctor.  Call the foot doctor and arrange for an appointment to be seen.  Return to the emergency department for any worsening pain, redness/swelling of the foot, fever or any other worsening or concerning symptoms.

## 2016-12-04 NOTE — ED Triage Notes (Signed)
Pt reports having knot on right side of foot. Has been seen for it already and was told he needed wider shoes. Pt still has pain, increases when walking.

## 2016-12-09 ENCOUNTER — Encounter (HOSPITAL_COMMUNITY): Payer: Self-pay | Admitting: Emergency Medicine

## 2016-12-09 ENCOUNTER — Emergency Department (HOSPITAL_COMMUNITY): Payer: BLUE CROSS/BLUE SHIELD

## 2016-12-09 ENCOUNTER — Emergency Department (HOSPITAL_COMMUNITY)
Admission: EM | Admit: 2016-12-09 | Discharge: 2016-12-09 | Disposition: A | Discharge status: Home / Self Care | Payer: BLUE CROSS/BLUE SHIELD | Attending: Emergency Medicine | Admitting: Emergency Medicine

## 2016-12-09 DIAGNOSIS — L84 Corns and callosities: Secondary | ICD-10-CM | POA: Diagnosis not present

## 2016-12-09 DIAGNOSIS — L03115 Cellulitis of right lower limb: Secondary | ICD-10-CM | POA: Diagnosis not present

## 2016-12-09 DIAGNOSIS — F17200 Nicotine dependence, unspecified, uncomplicated: Secondary | ICD-10-CM | POA: Diagnosis not present

## 2016-12-09 DIAGNOSIS — M79671 Pain in right foot: Secondary | ICD-10-CM | POA: Diagnosis present

## 2016-12-09 LAB — CBC WITH DIFFERENTIAL/PLATELET
BASOS PCT: 0 %
Basophils Absolute: 0 10*3/uL (ref 0.0–0.1)
EOS ABS: 0.3 10*3/uL (ref 0.0–0.7)
EOS PCT: 4 %
HCT: 43.5 % (ref 39.0–52.0)
HEMOGLOBIN: 14.6 g/dL (ref 13.0–17.0)
Lymphocytes Relative: 33 %
Lymphs Abs: 2.6 10*3/uL (ref 0.7–4.0)
MCH: 31.6 pg (ref 26.0–34.0)
MCHC: 33.6 g/dL (ref 30.0–36.0)
MCV: 94.2 fL (ref 78.0–100.0)
Monocytes Absolute: 0.6 10*3/uL (ref 0.1–1.0)
Monocytes Relative: 7 %
Neutro Abs: 4.3 10*3/uL (ref 1.7–7.7)
Neutrophils Relative %: 56 %
PLATELETS: 247 10*3/uL (ref 150–400)
RBC: 4.62 MIL/uL (ref 4.22–5.81)
RDW: 12.7 % (ref 11.5–15.5)
WBC: 7.8 10*3/uL (ref 4.0–10.5)

## 2016-12-09 LAB — BASIC METABOLIC PANEL
Anion gap: 11 (ref 5–15)
BUN: 11 mg/dL (ref 6–20)
CALCIUM: 8.9 mg/dL (ref 8.9–10.3)
CHLORIDE: 96 mmol/L — AB (ref 101–111)
CO2: 28 mmol/L (ref 22–32)
Creatinine, Ser: 1.09 mg/dL (ref 0.61–1.24)
GFR calc Af Amer: >60 mL/min (ref >60)
Glucose, Bld: 96 mg/dL (ref 65–99)
Potassium: 3.2 mmol/L — ABNORMAL LOW (ref 3.5–5.1)
Sodium: 135 mmol/L (ref 135–145)

## 2016-12-09 MED ORDER — SULFAMETHOXAZOLE-TRIMETHOPRIM 800-160 MG PO TABS: 1.0000 | ORAL_TABLET | Freq: Two times a day (BID) | ORAL | 0 refills | Status: DC

## 2016-12-09 MED ORDER — SULFAMETHOXAZOLE-TRIMETHOPRIM 800-160 MG PO TABS
1.0000 | ORAL_TABLET | Freq: Once | ORAL | Status: AC
  Administered 2016-12-09: 1 via ORAL
  Filled 2016-12-09: qty 1

## 2016-12-09 MED ORDER — HYDROCODONE-ACETAMINOPHEN 5-325 MG PO TABS
1.0000 | ORAL_TABLET | Freq: Once | ORAL | Status: AC
  Administered 2016-12-09: 1 via ORAL
  Filled 2016-12-09: qty 1

## 2016-12-09 MED ORDER — HYDROCODONE-ACETAMINOPHEN 5-325 MG PO TABS: ORAL_TABLET | ORAL | 0 refills | Status: DC

## 2016-12-09 NOTE — ED Provider Notes (Signed)
[facility] Provider Note   CSN: [id number] Arrival date & time: 12/09/16  1216     History   Chief Complaint Chief Complaint  Patient presents with  . Foot Pain    HPI   Blood pressure 138/86, pulse 84, temperature 98.8 F (37.1 C), temperature source Oral, resp. rate 17, height 5\' 11"  (1.803 m), weight 74.8 kg (165 lb), SpO2 100 %.  David [name] is a 51 y.o. male complaining of persistent pain to right foot. He was seen approximately one week ago and diagnosed with callus. He set up an appointment with a podiatrist but they can't see him until he end of the month. He'll continue using over-the-counter callus removal pads. He states the pain has worsened recently. He denies fever, chills, recent trauma, swelling, streaking up the leg. He does note and erythema around the callus which is new for him. He states that there has been discharge from the area but cannot describe it.  History reviewed. No pertinent past medical history.  There are no active problems to display for this patient.   Past Surgical History:  Procedure Laterality Date  . EXTERNAL EAR SURGERY         Home Medications    Prior to Admission medications   Medication Sig Start Date End Date Taking? Authorizing Provider  HYDROcodone-acetaminophen (NORCO/VICODIN) 5-325 MG tablet Take 1-2 tablets by mouth every 6 hours as needed for pain and/or cough. 12/09/16   Deforrest Bogle, Joni Reining, PA-C  sulfamethoxazole-trimethoprim (BACTRIM DS) 800-160 MG tablet Take 1 tablet by mouth 2 (two) times daily. 12/09/16   Eliette Drumwright, Mardella Layman    Family History History reviewed. No pertinent family history.  Social History Social History  Substance Use Topics  . Smoking status: Current Every Day Smoker  . Smokeless tobacco: Never Used  . Alcohol use No     Allergies   Patient has no known allergies.   Review of Systems Review of Systems  A complete review of systems was obtained and all systems are  negative except as noted in the HPI and PMH.    Physical Exam Updated Vital Signs BP 123/75 (BP Location: Left Arm)   Pulse 68   Temp 98.8 F (37.1 C) (Oral)   Resp 18   Ht 5\' 11"  (1.803 m)   Wt 74.8 kg (165 lb)   SpO2 100%   BMI 23.01 kg/m   Physical Exam  Constitutional: He is oriented to person, place, and time. He appears well-developed and well-nourished. No distress.  HENT:  Head: Normocephalic and atraumatic.  Mouth/Throat: Oropharynx is clear and moist.  Eyes: Conjunctivae and EOM are normal. Pupils are equal, round, and reactive to light.  Neck: Normal range of motion.  Cardiovascular: Normal rate, regular rhythm and intact distal pulses.   Pulmonary/Chest: Effort normal and breath sounds normal.  Abdominal: Soft. There is no tenderness.  Musculoskeletal: Normal range of motion.  Neurological: He is alert and oriented to person, place, and time.  Skin: He is not diaphoretic.  Right foot with callus on the ulnar aspect of the fifth MTP. There is discharge from the center. Trace surrounding cellulitis no edema or streaking up the leg  Psychiatric: He has a normal mood and affect.  Nursing note and vitals reviewed.    ED Treatments / Results  Labs (all labs ordered are listed, but only abnormal results are displayed) Labs Reviewed  BASIC METABOLIC PANEL - Abnormal; Notable for the following:       Result Value  Potassium 3.2 (*)    Chloride 96 (*)    All other components within normal limits  CBC WITH DIFFERENTIAL/PLATELET    EKG  EKG Interpretation None       Radiology Dg Foot Complete Right  Result Date: 12/09/2016 CLINICAL DATA:  51 year old male with lateral foot pain and swelling for the past 2- 3 weeks EXAM: RIGHT FOOT COMPLETE - 3+ VIEW COMPARISON:  None. FINDINGS: Focal soft tissue swelling lateral to the fifth MTP joint. There is a solitary lucency just deep to the skin surface which is best seen on the obliques view and likely represents a  small skin ulceration. No radiopaque retained foreign body. No evidence of fracture or malalignment. No conventional radiographic evidence of osteomyelitis. IMPRESSION: Focal soft tissue swelling lateral to the fifth MTP joint with an associated focal skin ulceration or eschar. No evidence of underlying osseous involvement. Electronically Signed   By: Malachy Moan M.D.   On: 12/09/2016 15:07    Procedures Procedures (including critical care time)  Medications Ordered in ED Medications  HYDROcodone-acetaminophen (NORCO/VICODIN) 5-325 MG per tablet 1 tablet (1 tablet Oral Given 12/09/16 1313)  sulfamethoxazole-trimethoprim (BACTRIM DS,SEPTRA DS) 800-160 MG per tablet 1 tablet (1 tablet Oral Given 12/09/16 1536)     Initial Impression / Assessment and Plan / ED Course  I have reviewed the triage vital signs and the nursing notes.  Pertinent labs & imaging results that were available during my care of the patient were reviewed by me and considered in my medical decision making (see chart for details).    Vitals:   12/09/16 1219 12/09/16 1532  BP: 138/86 123/75  Pulse: 84 68  Resp: 17 18  Temp: 98.8 F (37.1 C)   TempSrc: Oral   SpO2: 100% 100%  Weight: 74.8 kg (165 lb)   Height: 5\' 11"  (1.803 m)     Medications  HYDROcodone-acetaminophen (NORCO/VICODIN) 5-325 MG per tablet 1 tablet (1 tablet Oral Given 12/09/16 1313)  sulfamethoxazole-trimethoprim (BACTRIM DS,SEPTRA DS) 800-160 MG per tablet 1 tablet (1 tablet Oral Given 12/09/16 1536)    David [name] is 51 y.o. male presenting with Callus to right great foot on the ulnar side he's been using a callus remover and there is some discharge from the center. There is a very scant amount of trace surrounding cellulitis. Patient will be started on Bactrim. Blood work reassuring with no leukocytosis. He will be given crutches and advised to follow very closely with orthopedist with had an extensive discussion of return precautions and  patient verbalizes understanding  Counseled patient on smoking cessation for approximately 10 min.  Evaluation does not show pathology that would require ongoing emergent intervention or inpatient treatment. Pt is hemodynamically stable and mentating appropriately. Discussed findings and plan with patient/guardian, who agrees with care plan. All questions answered. Return precautions discussed and outpatient follow up given.      Final Clinical Impressions(s) / ED Diagnoses   Final diagnoses:  Cellulitis of right lower extremity  Callus of foot    New Prescriptions New Prescriptions   HYDROCODONE-ACETAMINOPHEN (NORCO/VICODIN) 5-325 MG TABLET    Take 1-2 tablets by mouth every 6 hours as needed for pain and/or cough.   SULFAMETHOXAZOLE-TRIMETHOPRIM (BACTRIM DS) 800-160 MG TABLET    Take 1 tablet by mouth 2 (two) times daily.     Kaylyn Lim 12/09/16 1557    Loren Racer, MD 12/13/16 [phone]

## 2016-12-09 NOTE — ED Notes (Signed)
Pt staets he understands instructions. Home stable with crutches.

## 2016-12-09 NOTE — Discharge Instructions (Addendum)
Clean the area with soap and water in the morning and night, cover with a nonadherent dressing.  Do not soak the area, do not apply any over-the-counter medications to the area. Do not use rubbing alcohol or hydrogen peroxide. Take your antibiotics as directed. Follow with the podiatrist as soon as possible. If you have any worsening signs of infection including warmth, worsening discharge, swelling return to the emergency department immediately for recheck.

## 2016-12-09 NOTE — ED Notes (Signed)
Non adherent pad applied to R lateral foot and secured with kirlix wrap

## 2016-12-09 NOTE — Progress Notes (Signed)
Orthopedic Tech Progress Note Patient Details:  WOODLEY LANDT [birth year] [id number]  Ortho Devices Type of Ortho Device: Crutches Ortho Device/Splint Interventions: Application   Saul Fordyce 12/09/2016, 3:48 PM

## 2016-12-09 NOTE — ED Triage Notes (Signed)
Pt here for foot pain from  calluses on foot

## 2016-12-28 ENCOUNTER — Ambulatory Visit: Payer: BLUE CROSS/BLUE SHIELD | Admitting: Podiatry

## 2017-01-05 ENCOUNTER — Ambulatory Visit (INDEPENDENT_AMBULATORY_CARE_PROVIDER_SITE_OTHER): Payer: BLUE CROSS/BLUE SHIELD | Admitting: Podiatry

## 2017-01-05 ENCOUNTER — Encounter: Payer: Self-pay | Admitting: Podiatry

## 2017-01-05 VITALS — BP 154/82 | HR 71 | Resp 18

## 2017-01-05 DIAGNOSIS — M21621 Bunionette of right foot: Secondary | ICD-10-CM | POA: Diagnosis not present

## 2017-01-05 DIAGNOSIS — Q828 Other specified congenital malformations of skin: Secondary | ICD-10-CM | POA: Diagnosis not present

## 2017-01-05 DIAGNOSIS — B351 Tinea unguium: Secondary | ICD-10-CM | POA: Diagnosis not present

## 2017-01-05 DIAGNOSIS — L84 Corns and callosities: Secondary | ICD-10-CM | POA: Insufficient documentation

## 2017-01-05 DIAGNOSIS — S90859A Superficial foreign body, unspecified foot, initial encounter: Secondary | ICD-10-CM | POA: Insufficient documentation

## 2017-01-05 DIAGNOSIS — M21622 Bunionette of left foot: Secondary | ICD-10-CM

## 2017-01-05 DIAGNOSIS — S90851A Superficial foreign body, right foot, initial encounter: Secondary | ICD-10-CM

## 2017-01-05 NOTE — Progress Notes (Signed)
   Subjective:    Patient ID: David [name], male    DOB: [birth year], 51 y.o.   MRN: [id number]  HPI  David [name] Presents the office for painful calcinosis of the right foot and he points to submetatarsal 5. He states he is on the emergency room a couple times for this. He has placed on antibiotics which did help earlier in July. He states the areas painful pressure in shoes. Denies that any form. He is unsure if he is actually done a knot. He states that over the area that was concerned for infection and the emergency room is still swollen and still gets pain to the area. Denies any redness or red streaks denies any drainage or open sores the area. He also states his nails are thick and discolored and is requesting treatment options for nail fungus..   Review of Systems  Skin: Positive for color change.  All other systems reviewed and are negative.      Objective:   Physical Exam General: AAO x3, NAD; appears to be nervous and states that he is  Dermatological: Small annular hyperkeratotic lesion right submetatarsal 5. Upon debridement there is no definitive open ulceration, drainage or any signs of infection. There is no evidence of foreign body identified however does appear to be deep lesion there is swelling around the area. Is no erythema or increase in warmth. There is no fluctuance or crepitus. There is no malodor. Small hyperkeratotic lesion second metatarsal of the left foot without any ulceration, swelling or signs of infection. Nails are very discolored, dystrophic, hypertrophic with yellow-brown discoloration. No pain in the nails no surrounding redness or drainage.  Vascular: Dorsalis Pedis artery and Posterior Tibial artery pedal pulses are 2/4 bilateral with immedate capillary fill time. There is no pain with calf compression, swelling, warmth, erythema.   Neruologic: Grossly intact via light touch bilateral. Protective threshold with Semmes Wienstein monofilament intact to all  pedal sites bilateral.   Musculoskeletal: Taylor's bunions are present bilaterally. There is tenderness right symmetric 5 on the hyperkeratotic lesion. No other areas of tenderness. Muscular strength 5/5 in all groups tested bilateral.  Gait: Unassisted, Nonantalgic.     Assessment & Plan:  51 year old male with right foot porokeratosis, rule out foreign body; Taylor's bunions; onychomycosis -Treatment options discussed including all alternatives, risks, and complications -Etiology of symptoms were discussed -Sharply  debrided hyperkeratotic lesions 2 without, occasions or bleeding submetatarsal 5. Upon debridement right side unable to identify a foreign body or any signs of infection or other does continue to be swelling of pain to the area. He's had infections this area. Previous x-rays were reviewed which were negative. We will order an ultrasound to rule out any underlying foreign body to this area. Spoke with David James, RN and ultrasounds is to be ordered today.  -Offloading for the tailors bunions -Compound cream for onychomycosis sent to [facility] pharmacy.  -RTC after ultrasound.  -We'll hold off on further antibiotic as this point.  David [name], DPM

## 2017-01-05 NOTE — Patient Instructions (Signed)
I have ordered an ultrasound of your right foot. If you don't hear from anyone within 1 week about scheduling this, please give [place] a call at [phone] or sooner if you need anything.  I have ordered an ointment for your toenail fungus. A pharmacy from [place] will call you and have it mailed to your house.   Have a good rest of your week.

## 2017-01-06 ENCOUNTER — Other Ambulatory Visit: Payer: Self-pay

## 2017-01-06 DIAGNOSIS — M21621 Bunionette of right foot: Secondary | ICD-10-CM | POA: Insufficient documentation

## 2017-01-06 DIAGNOSIS — M21622 Bunionette of left foot: Secondary | ICD-10-CM

## 2017-01-06 DIAGNOSIS — B351 Tinea unguium: Secondary | ICD-10-CM | POA: Insufficient documentation

## 2017-01-06 MED ORDER — NONFORMULARY OR COMPOUNDED ITEM: 1.0000 g | Freq: Every day | 11 refills | Status: DC

## 2017-01-09 ENCOUNTER — Telehealth: Payer: Self-pay | Admitting: *Deleted

## 2017-01-09 NOTE — Telephone Encounter (Signed)
Faxed [place] orders to [facility].

## 2017-01-11 ENCOUNTER — Other Ambulatory Visit: Payer: BLUE CROSS/BLUE SHIELD

## 2017-01-12 ENCOUNTER — Ambulatory Visit
Admission: RE | Admit: 2017-01-12 | Discharge: 2017-01-12 | Disposition: A | Discharge status: Home / Self Care | Payer: BLUE CROSS/BLUE SHIELD | Source: Ambulatory Visit | Attending: Podiatry | Admitting: Podiatry

## 2017-01-12 DIAGNOSIS — S90851A Superficial foreign body, right foot, initial encounter: Secondary | ICD-10-CM

## 2017-01-18 ENCOUNTER — Telehealth: Payer: Self-pay | Admitting: Podiatry

## 2017-01-18 NOTE — Telephone Encounter (Signed)
Left vm for pt to call office to schedule appt °

## 2017-01-25 ENCOUNTER — Telehealth: Payer: Self-pay | Admitting: Podiatry

## 2017-01-25 NOTE — Telephone Encounter (Signed)
Left another vm for pt to call office to schedule appt.David [name]

## 2017-02-13 ENCOUNTER — Ambulatory Visit (INDEPENDENT_AMBULATORY_CARE_PROVIDER_SITE_OTHER): Payer: BLUE CROSS/BLUE SHIELD | Admitting: Podiatry

## 2017-02-13 ENCOUNTER — Encounter: Payer: Self-pay | Admitting: Podiatry

## 2017-02-13 ENCOUNTER — Ambulatory Visit (INDEPENDENT_AMBULATORY_CARE_PROVIDER_SITE_OTHER): Payer: BLUE CROSS/BLUE SHIELD

## 2017-02-13 DIAGNOSIS — L02619 Cutaneous abscess of unspecified foot: Secondary | ICD-10-CM | POA: Diagnosis not present

## 2017-02-13 DIAGNOSIS — L03119 Cellulitis of unspecified part of limb: Secondary | ICD-10-CM | POA: Diagnosis not present

## 2017-02-13 DIAGNOSIS — M21629 Bunionette of unspecified foot: Secondary | ICD-10-CM | POA: Diagnosis not present

## 2017-02-13 DIAGNOSIS — Q828 Other specified congenital malformations of skin: Secondary | ICD-10-CM | POA: Diagnosis not present

## 2017-02-16 NOTE — Progress Notes (Signed)
Subjective: Saabir presents the office today for follow-up evaluation of swelling, callus the right foot as well as his left foot. He had an ultrasound in his right foot he presents today for follow-up evaluation of this. He states that his foot feels "much better" than it did after last appointment he is very thankful. He is also stated the swelling to the right foot has almost resolved. He is able to wear regular shoe. Denies any recent injury or trauma. Denies any systemic complaints such as fevers, chills, nausea, vomiting. No acute changes since last appointment, and no other complaints at this time.   Objective: AAO x3, NAD DP/PT pulses palpable bilaterally, CRT less than 3 seconds Hyperkeratotic lesion bilateral submetatarsal 5. After debridement there is no underlying ulceration, drainage or any signs of infection. On the right foot there is no evidence of foreign body. There is no smoking swelling to the area there is no erythema or increase in warmth. There is no pain to the area today. There is no fluctuation or crepitation. There is no clinical signs of infection present. Tailors bunion present.  Dry skin is present bilaterally No open lesions or pre-ulcerative lesions.  No pain with calf compression, swelling, warmth, erythema  Assessment: Symptomatic hyperkeratotic lesions  Plan: -All treatment options discussed with the patient including all alternatives, risks, complications.  -Ultrasound results were discussed with the patient. Ultrasounds concerning for possible gas in the soft tissue and the right foot there is no abscess. Clinically there is no evidence of any infection of his foot. I debrided a hyperkeratotic lesion sooner any complications or bleeding. -Continue moisturizer to the feet daily. -Follow-up as needed. -Patient encouraged to call the office with any questions, concerns, change in symptoms.   Ovid Curd, DPM

## 2017-04-01 ENCOUNTER — Encounter (HOSPITAL_COMMUNITY): Payer: Self-pay | Admitting: *Deleted

## 2017-04-01 ENCOUNTER — Emergency Department (HOSPITAL_COMMUNITY)
Admission: EM | Admit: 2017-04-01 | Discharge: 2017-04-01 | Disposition: A | Discharge status: Home / Self Care | Payer: BLUE CROSS/BLUE SHIELD | Attending: Emergency Medicine | Admitting: Emergency Medicine

## 2017-04-01 DIAGNOSIS — R110 Nausea: Secondary | ICD-10-CM | POA: Insufficient documentation

## 2017-04-01 DIAGNOSIS — F17210 Nicotine dependence, cigarettes, uncomplicated: Secondary | ICD-10-CM | POA: Insufficient documentation

## 2017-04-01 DIAGNOSIS — K5900 Constipation, unspecified: Secondary | ICD-10-CM | POA: Diagnosis not present

## 2017-04-01 DIAGNOSIS — R1030 Lower abdominal pain, unspecified: Secondary | ICD-10-CM | POA: Insufficient documentation

## 2017-04-01 LAB — URINALYSIS, ROUTINE W REFLEX MICROSCOPIC
BILIRUBIN URINE: NEGATIVE
Glucose, UA: NEGATIVE mg/dL
Hgb urine dipstick: NEGATIVE
Ketones, ur: 5 mg/dL — AB
Leukocytes, UA: NEGATIVE
NITRITE: NEGATIVE
Protein, ur: NEGATIVE mg/dL
SPECIFIC GRAVITY, URINE: 1.024 (ref 1.005–1.030)
pH: 7 (ref 5.0–8.0)

## 2017-04-01 LAB — COMPREHENSIVE METABOLIC PANEL
ALBUMIN: 4 g/dL (ref 3.5–5.0)
ALT: 9 U/L — ABNORMAL LOW (ref 17–63)
AST: 18 U/L (ref 15–41)
Alkaline Phosphatase: 73 U/L (ref 38–126)
Anion gap: 8 (ref 5–15)
BUN: 7 mg/dL (ref 6–20)
CALCIUM: 9.1 mg/dL (ref 8.9–10.3)
CO2: 28 mmol/L (ref 22–32)
Chloride: 103 mmol/L (ref 101–111)
Creatinine, Ser: 1.18 mg/dL (ref 0.61–1.24)
GFR calc non Af Amer: >60 mL/min (ref >60)
GLUCOSE: 97 mg/dL (ref 65–99)
POTASSIUM: 3.7 mmol/L (ref 3.5–5.1)
SODIUM: 139 mmol/L (ref 135–145)
Total Bilirubin: 1 mg/dL (ref 0.3–1.2)
Total Protein: 7.2 g/dL (ref 6.5–8.1)

## 2017-04-01 LAB — LIPASE, BLOOD: Lipase: 21 U/L (ref 11–51)

## 2017-04-01 LAB — CBC
HEMATOCRIT: 44.5 % (ref 39.0–52.0)
HEMOGLOBIN: 15.1 g/dL (ref 13.0–17.0)
MCH: 32.1 pg (ref 26.0–34.0)
MCHC: 33.9 g/dL (ref 30.0–36.0)
MCV: 94.5 fL (ref 78.0–100.0)
Platelets: 205 10*3/uL (ref 150–400)
RBC: 4.71 MIL/uL (ref 4.22–5.81)
RDW: 12.6 % (ref 11.5–15.5)
WBC: 7.3 10*3/uL (ref 4.0–10.5)

## 2017-04-01 MED ORDER — ONDANSETRON 4 MG PO TBDP
4.0000 mg | ORAL_TABLET | Freq: Once | ORAL | Status: AC
  Administered 2017-04-01: 4 mg via ORAL
  Filled 2017-04-01: qty 1

## 2017-04-01 MED ORDER — POLYETHYLENE GLYCOL 3350 17 G PO PACK: 17.0000 g | PACK | Freq: Every day | ORAL | 0 refills | Status: DC

## 2017-04-01 NOTE — ED Provider Notes (Signed)
[facility] EMERGENCY DEPARTMENT Provider Note   CSN: [id number] Arrival date & time: 04/01/17  1202     History   Chief Complaint Chief Complaint  Patient presents with  . Abdominal Pain    HPI David [name] is a 51 y.o. male.  HPI Patient presents with lower abdominal pain.  Began around 5 days ago.  Dull.  Has had some nausea with a decreased appetite.  Also has been constipated.  No dysuria.  No fevers.  No sick contacts.  Has not really had symptoms like this before but does have a history of constipation.  Pain is dull.  Not initially worse with eating.   History reviewed. No pertinent past medical history.  Patient Active Problem List   Diagnosis Date Noted  . Tailor's bunion of both feet 01/06/2017  . Onychomycosis 01/06/2017  . Foot callus 01/05/2017  . Foreign body in foot 01/05/2017    Past Surgical History:  Procedure Laterality Date  . EXTERNAL EAR SURGERY         Home Medications    Prior to Admission medications   Medication Sig Start Date End Date Taking? Authorizing Provider  NONFORMULARY OR COMPOUNDED ITEM Apply 1-2 g topically daily. [employer] Nail lacquer: Fluconazole 2%, Terbinafine 1%, DMSO 01/06/17   Vivi Barrack, DPM    Family History No family history on file.  Social History Social History  Substance Use Topics  . Smoking status: Current Every Day Smoker    Packs/day: 0.50    Types: Cigarettes  . Smokeless tobacco: Never Used  . Alcohol use No     Allergies   Patient has no known allergies.   Review of Systems Review of Systems  Constitutional: Positive for appetite change. Negative for chills and fatigue.  HENT: Negative for congestion.   Respiratory: Negative for choking.   Cardiovascular: Negative for chest pain.  Gastrointestinal: Positive for abdominal pain, constipation, nausea and vomiting.  Genitourinary: Negative for hematuria and penile pain.  Musculoskeletal: Negative for back pain.    Neurological: Negative for seizures.  Hematological: Negative for adenopathy.     Physical Exam Updated Vital Signs BP (!) 162/90   Pulse 77   Temp 98.3 F (36.8 C) (Oral)   Resp 14   SpO2 97%   Physical Exam  Constitutional: He appears well-developed.  HENT:  Head: Atraumatic.  Eyes: Pupils are equal, round, and reactive to light.  Neck: Neck supple.  Cardiovascular: Normal rate.   Pulmonary/Chest: Effort normal.  Abdominal: Soft. No hernia.  Mild suprapubic tenderness without rebound or guarding.  Musculoskeletal: He exhibits no edema.  Neurological: He is alert.  Skin: Skin is warm. Capillary refill takes less than 2 seconds.     ED Treatments / Results  Labs (all labs ordered are listed, but only abnormal results are displayed) Labs Reviewed  COMPREHENSIVE METABOLIC PANEL - Abnormal; Notable for the following:       Result Value   ALT 9 (*)    All other components within normal limits  LIPASE, BLOOD  CBC  URINALYSIS, ROUTINE W REFLEX MICROSCOPIC    EKG  EKG Interpretation None       Radiology No results found.  Procedures Procedures (including critical care time)  Medications Ordered in ED Medications  ondansetron (ZOFRAN-ODT) disintegrating tablet 4 mg (4 mg Oral Given 04/01/17 1446)     Initial Impression / Assessment and Plan / ED Course  I have reviewed the triage vital signs and the nursing notes.  Pertinent labs & imaging results that were available during my care of the patient were reviewed by me and considered in my medical decision making (see chart for details).     Patient with abdominal pain.  Has had some constipation with it.  Lab work reassuring.  Urine still pending however.  Likely be able to discharge after it.  Discussed with patient that this may be more than the constipation but with labs reassuring is reasonable to give a trial of some laxatives and see if it will help.  Care turned over to Dr. Jodi MourningZavitz.  Final Clinical  Impressions(s) / ED Diagnoses   Final diagnoses:  Lower abdominal pain  Constipation, unspecified constipation type    New Prescriptions New Prescriptions   No medications on file     Benjiman CorePickering, Gwenyth Dingee, MD 04/01/17 [phone]

## 2017-04-01 NOTE — Discharge Instructions (Addendum)
If you were given medicines take as directed.  If you are on coumadin or contraceptives realize their levels and effectiveness is altered by many different medicines.  If you have any reaction (rash, tongues swelling, other) to the medicines stop taking and see a physician.    If your blood pressure was elevated in the ER make sure you follow up for management with a primary doctor or return for chest pain, shortness of breath or stroke symptoms.  Please follow up as directed and return to the ER or see a physician for new or worsening symptoms.  Thank you. Vitals:   04/01/17 1445 04/01/17 1500 04/01/17 1530 04/01/17 1615  BP: (!) 150/80 (!) 158/93 (!) 162/90 (!) 153/91  Pulse: 69 63 77 62  Resp:      Temp:      TempSrc:      SpO2: 99% 98% 97% 99%

## 2017-04-01 NOTE — ED Notes (Signed)
Pt given ginger ale to do PO fluid challenge once he feels like nausea medications has helped.

## 2017-04-01 NOTE — ED Triage Notes (Signed)
Pt c/o peri umbilical pain onset x 5 days, pt c/o nausea, pt reports no appetite, pt constipated, pt vomited once today, A&O x4

## 2017-09-02 IMAGING — US US EXTREM LOW*R* LIMITED
1 series · 14 of 22 positions shown · non-contrast
Comparison: Radiographs 12/09/2016

CLINICAL DATA: Lateral soft tissue swelling of the right foot for 2
months.

EXAM:
ULTRASOUND right LOWER EXTREMITY LIMITED
TECHNIQUE: Ultrasound examination of the lower extremity soft tissues was
performed in the area of clinical concern.

[Series 1: us extrem low*right* limited · 0.05mm/px · 14 of 22 slices shown]
[im 1/22]
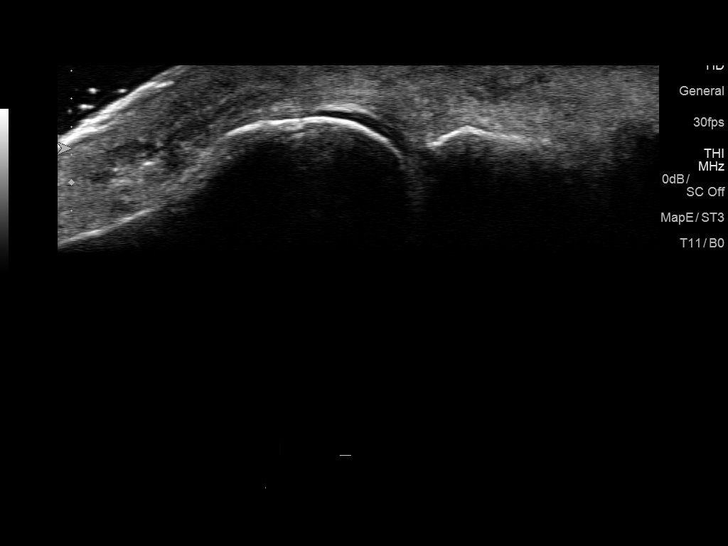
[im 3/22]
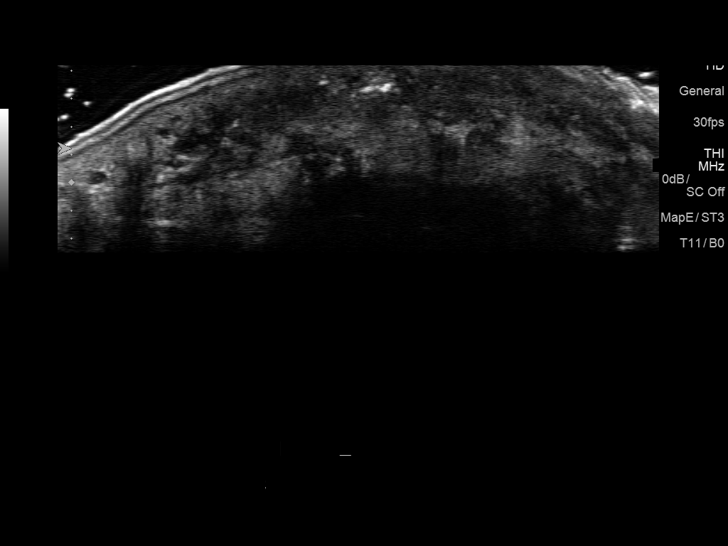
[im 4/22]
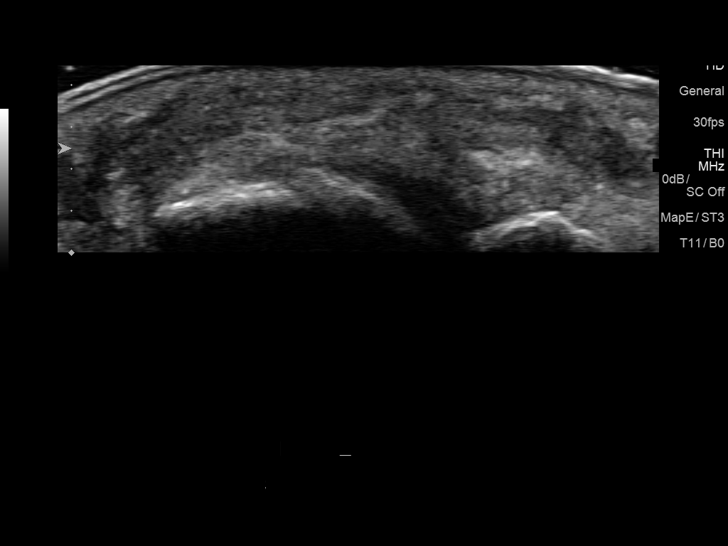
[im 6/22]
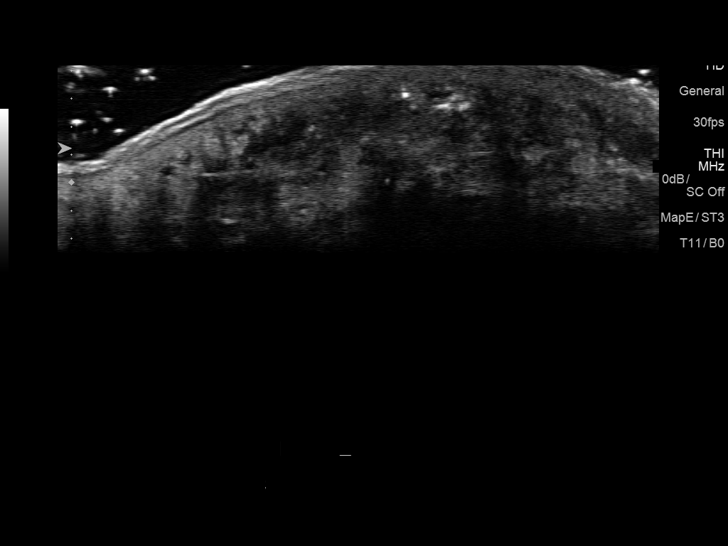
[im 8/22]
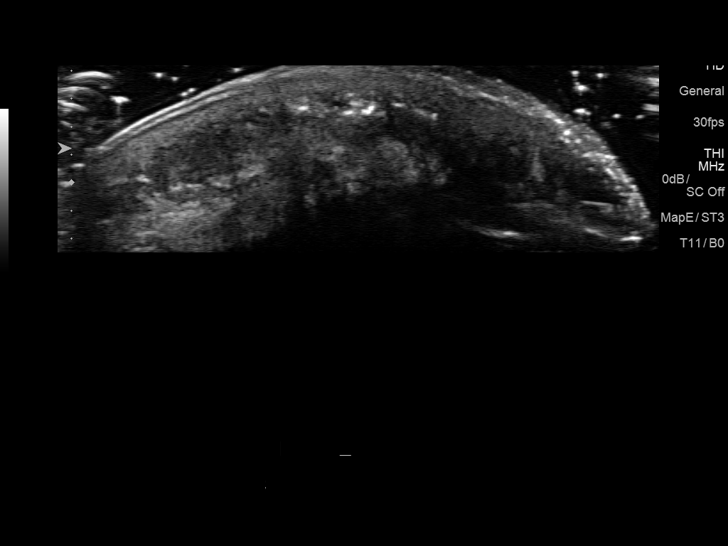
[im 9/22]
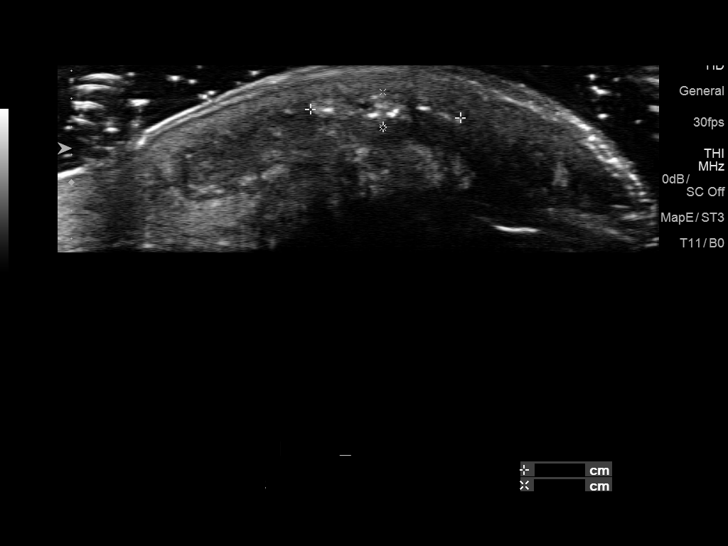
[im 11/22]
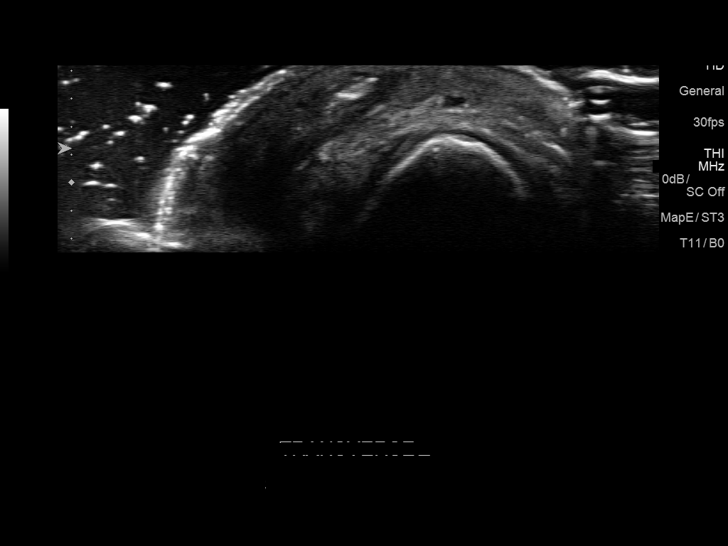
[im 12/22]
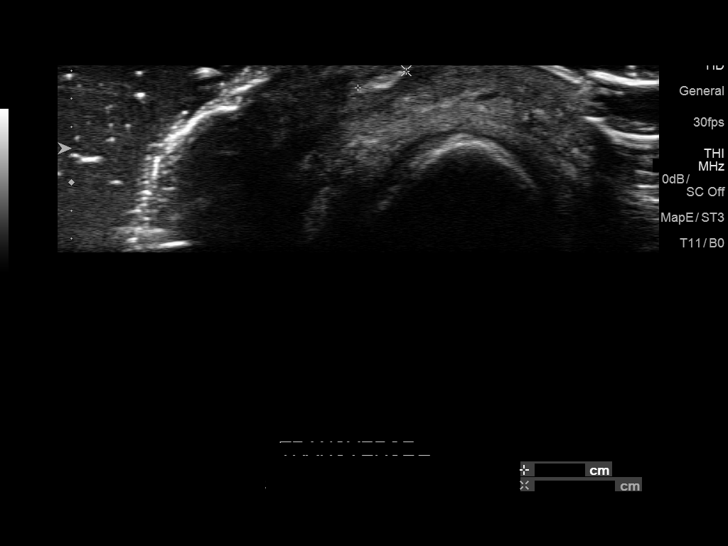
[im 14/22]
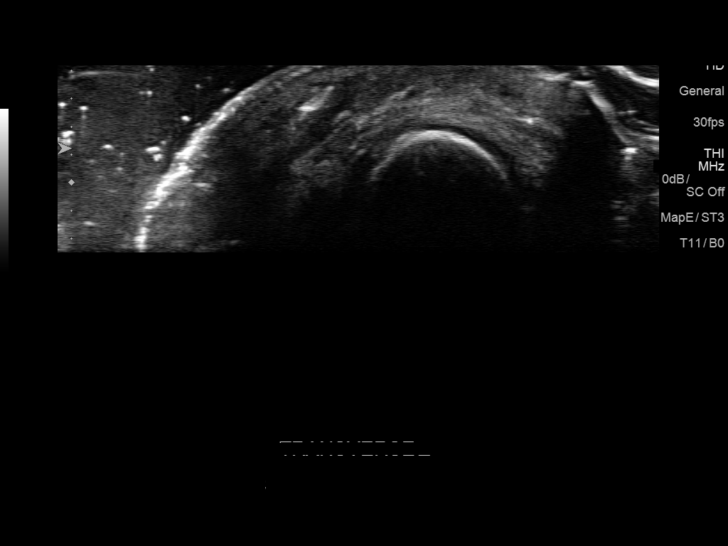
[im 15/22]
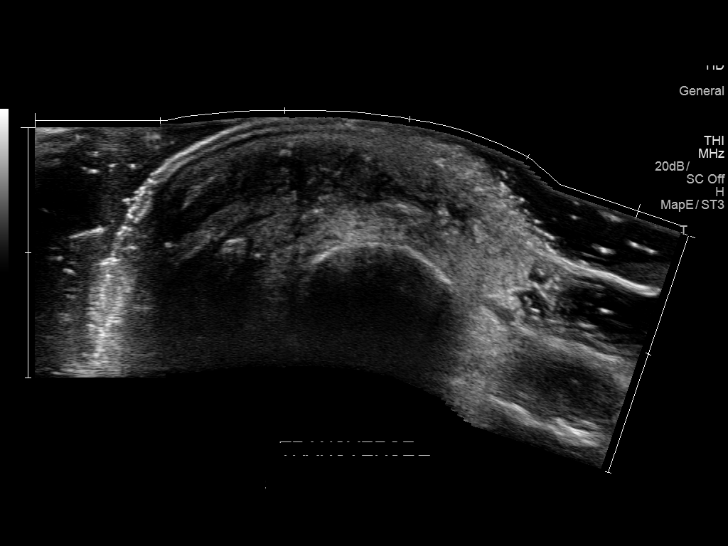
[im 17/22]
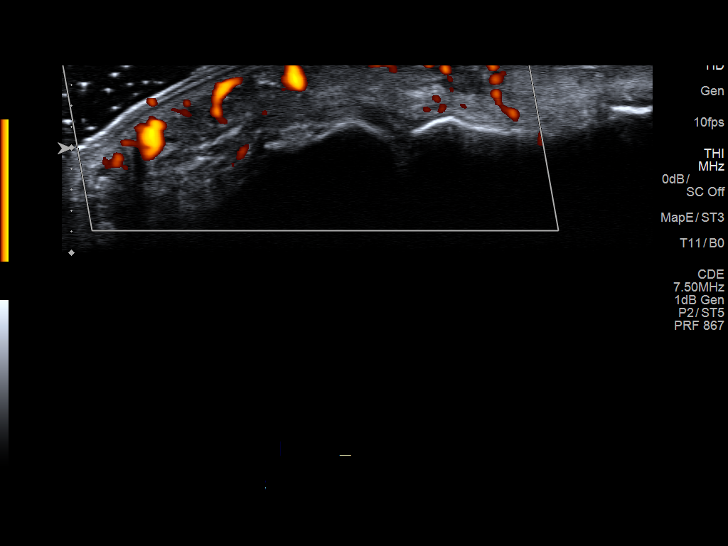
[im 19/22]
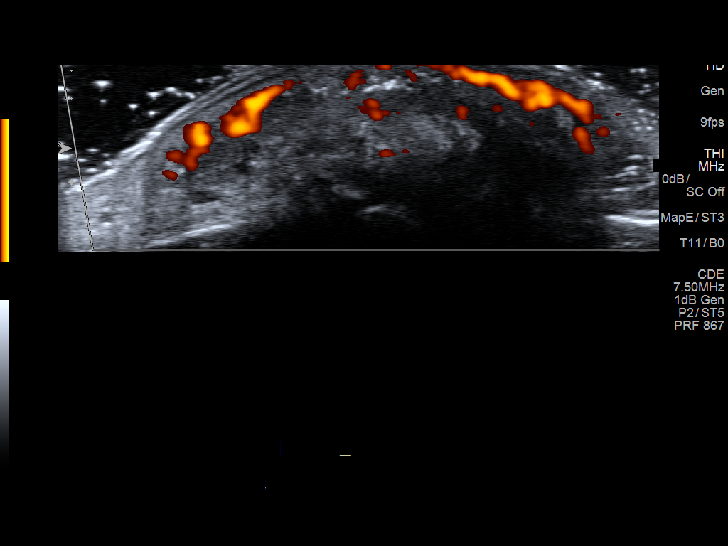
[im 20/22]
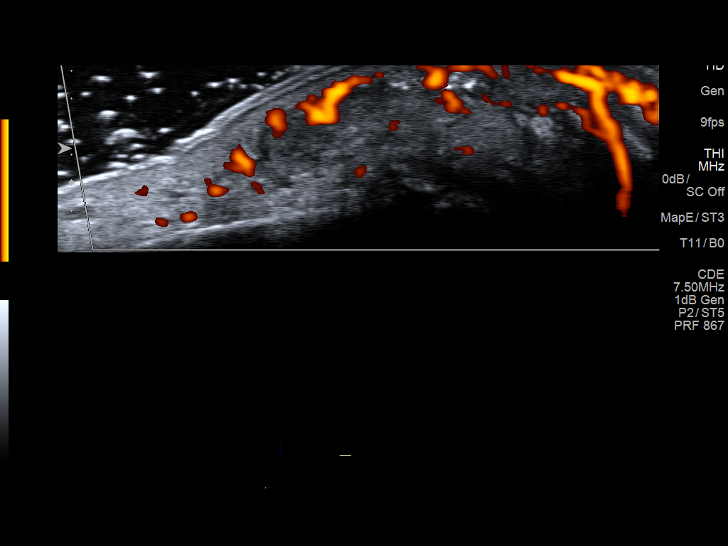
[im 22/22]
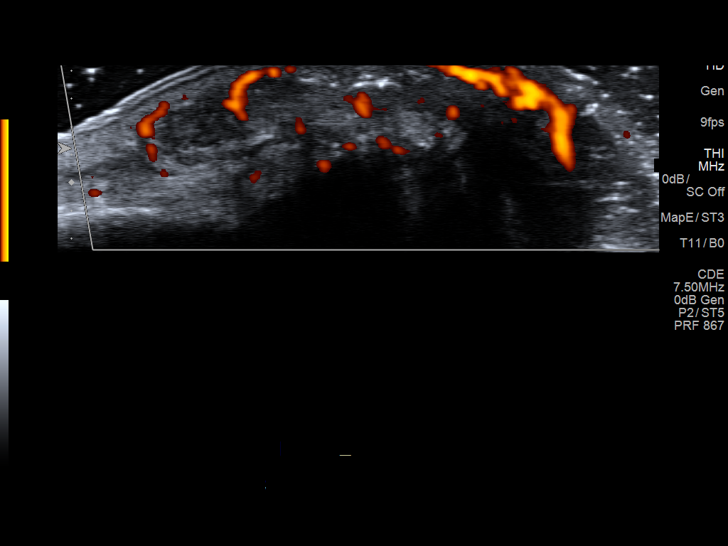

[14 of 22 positions shown; findings below may reference images not displayed]

FINDINGS: Diffuse soft tissue swelling/ edema is noted along the lateral
aspect of the fifth metatarsal and fifth MTP joint. Central
echogenic foci are noted which could reflect air/gas. No discrete
drainable fluid collection to suggest an abscess. Increased blood
flow this area suggesting inflammation/infection. No discrete
definable foreign body.

No obvious joint effusion at the fifth MTP joint.
IMPRESSION: Diffuse soft tissue swelling/edema and probable gas in the soft
tissues but no discrete drainable fluid collection.

## 2017-09-09 ENCOUNTER — Encounter (HOSPITAL_COMMUNITY): Payer: Self-pay | Admitting: Emergency Medicine

## 2017-09-09 ENCOUNTER — Emergency Department (HOSPITAL_COMMUNITY)
Admission: EM | Admit: 2017-09-09 | Discharge: 2017-09-09 | Disposition: A | Discharge status: Home / Self Care | Payer: BLUE CROSS/BLUE SHIELD | Attending: Emergency Medicine | Admitting: Emergency Medicine

## 2017-09-09 ENCOUNTER — Other Ambulatory Visit: Payer: Self-pay

## 2017-09-09 DIAGNOSIS — Z79899 Other long term (current) drug therapy: Secondary | ICD-10-CM | POA: Insufficient documentation

## 2017-09-09 DIAGNOSIS — R1033 Periumbilical pain: Secondary | ICD-10-CM

## 2017-09-09 DIAGNOSIS — F17210 Nicotine dependence, cigarettes, uncomplicated: Secondary | ICD-10-CM | POA: Insufficient documentation

## 2017-09-09 LAB — URINALYSIS, ROUTINE W REFLEX MICROSCOPIC
BACTERIA UA: NONE SEEN
BILIRUBIN URINE: NEGATIVE
Glucose, UA: NEGATIVE mg/dL
KETONES UR: 5 mg/dL — AB
LEUKOCYTES UA: NEGATIVE
Nitrite: NEGATIVE
Protein, ur: NEGATIVE mg/dL
Specific Gravity, Urine: 1.027 (ref 1.005–1.030)
pH: 5 (ref 5.0–8.0)

## 2017-09-09 LAB — COMPREHENSIVE METABOLIC PANEL
ALT: 10 U/L — AB (ref 17–63)
AST: 17 U/L (ref 15–41)
Albumin: 3.9 g/dL (ref 3.5–5.0)
Alkaline Phosphatase: 70 U/L (ref 38–126)
Anion gap: 10 (ref 5–15)
BUN: 16 mg/dL (ref 6–20)
CHLORIDE: 102 mmol/L (ref 101–111)
CO2: 26 mmol/L (ref 22–32)
CREATININE: 1.22 mg/dL (ref 0.61–1.24)
Calcium: 8.9 mg/dL (ref 8.9–10.3)
Glucose, Bld: 94 mg/dL (ref 65–99)
POTASSIUM: 3.5 mmol/L (ref 3.5–5.1)
SODIUM: 138 mmol/L (ref 135–145)
Total Bilirubin: 1.1 mg/dL (ref 0.3–1.2)
Total Protein: 7.4 g/dL (ref 6.5–8.1)

## 2017-09-09 LAB — CBC
HEMATOCRIT: 46.2 % (ref 39.0–52.0)
Hemoglobin: 15.7 g/dL (ref 13.0–17.0)
MCH: 32 pg (ref 26.0–34.0)
MCHC: 34 g/dL (ref 30.0–36.0)
MCV: 94.3 fL (ref 78.0–100.0)
PLATELETS: 248 10*3/uL (ref 150–400)
RBC: 4.9 MIL/uL (ref 4.22–5.81)
RDW: 12.1 % (ref 11.5–15.5)
WBC: 8.4 10*3/uL (ref 4.0–10.5)

## 2017-09-09 LAB — LIPASE, BLOOD: LIPASE: 31 U/L (ref 11–51)

## 2017-09-09 MED ORDER — GI COCKTAIL ~~LOC~~
30.0000 mL | Freq: Once | ORAL | Status: AC
  Administered 2017-09-09: 30 mL via ORAL
  Filled 2017-09-09: qty 30

## 2017-09-09 MED ORDER — ONDANSETRON 4 MG PO TBDP: 4.0000 mg | ORAL_TABLET | Freq: Three times a day (TID) | ORAL | 0 refills | Status: DC | PRN

## 2017-09-09 NOTE — ED Provider Notes (Signed)
I have personally seen and examined the patient. I have reviewed the documentation on PMH/FH/Soc Hx. I have discussed the plan of care with the resident and patient.  I have reviewed and agree with the resident's documentation. Please see associated encounter note.  Briefly, the patient is a 52 y.o. male here with mid abd pain, intermittent. With N/V/D. Subjective fevers. No sick contact, suspicious food intake. abd benign. Labs reassuring. Tolerating PO challenge. No indication for advance imaging at this time. The patient appears reasonably screened and/or stabilized for discharge and I doubt any other medical condition or other [facility] requiring further screening, evaluation, or treatment in the ED at this time prior to discharge.    EKG Interpretation None         Horrace Hanak, Amadeo Garnet, MD 09/09/17 [phone]

## 2017-09-09 NOTE — ED Triage Notes (Signed)
Pt. Stated, David [name] had stomach pain for 3-4 days . IT comes and goes.

## 2017-09-09 NOTE — Discharge Instructions (Addendum)
For nausea, you can take one Zofran tablet up to every 8 hours as needed. For abdominal pain, you can take ibuprofen or Tylenol as needed. It is important to stay well-hydrated. Try taking small sips every 15 minutes rather than large gulps.

## 2017-09-09 NOTE — ED Provider Notes (Signed)
[facility] EMERGENCY DEPARTMENT Provider Note   CSN: [id number] Arrival date & time: 09/09/17  1320  History   Chief Complaint Chief Complaint  Patient presents with  . Abdominal Pain    HPI David [name] is a 52 y.o. male presenting with abdominal pain. No significant PMH.   HPI Patient presents with abd pain x3-4d. States that pain is located in middle of his abd. Pain is sharp and comes and goes. Endorses N/V/D, however is able to tolerate PO. Last episode of vomiting yesterday, last episode of diarrhea 2d ago. Has taken Alka Seltzer and Rolaids with no improvement in sx. Pain has been keeping him up at night. Endorses subjective fevers. Denies sick contacts. Denies recent abx use, travel/camping, suspicious foods. Cannot identify any alleviating or worsening factors. States that he is currently hungry.    History reviewed. No pertinent past medical history.  Patient Active Problem List   Diagnosis Date Noted  . Tailor's bunion of both feet 01/06/2017  . Onychomycosis 01/06/2017  . Foot callus 01/05/2017  . Foreign body in foot 01/05/2017    Past Surgical History:  Procedure Laterality Date  . EXTERNAL EAR SURGERY        Home Medications    Prior to Admission medications   Medication Sig Start Date End Date Taking? Authorizing Provider  NONFORMULARY OR COMPOUNDED ITEM Apply 1-2 g topically daily. [employer] Nail lacquer: Fluconazole 2%, Terbinafine 1%, DMSO 01/06/17   Vivi Barrack, DPM  ondansetron (ZOFRAN-ODT) 4 MG disintegrating tablet Take 1 tablet (4 mg total) by mouth every 8 (eight) hours as needed for nausea or vomiting. 09/09/17   Marquette Saa, MD  polyethylene glycol [facility] Ethelene Hal) packet Take 17 g by mouth daily. 04/01/17   Blane Ohara, MD    Family History No family history on file.  Social History Social History   Tobacco Use  . Smoking status: Current Every Day Smoker    Packs/day: 0.50    Types:  Cigarettes  . Smokeless tobacco: Never Used  Substance Use Topics  . Alcohol use: No  . Drug use: Yes    Types: Marijuana     Allergies   Patient has no known allergies.   Review of Systems Review of Systems  Constitutional: Positive for fever (Subjective).  Respiratory: Negative for shortness of breath.   Cardiovascular: Negative for chest pain.  Gastrointestinal: Positive for abdominal pain, diarrhea, nausea and vomiting. Negative for constipation.  Genitourinary: Negative for dysuria and hematuria.  Musculoskeletal: Negative for back pain.  Neurological: Negative for dizziness and headaches.    Physical Exam Updated Vital Signs BP (!) 147/90   Pulse 67   Temp 98.2 F (36.8 C) (Oral)   Resp 16   Ht 5\' 11"  (1.803 m)   Wt 78 kg (172 lb)   SpO2 99%   BMI 23.99 kg/m   Physical Exam  Constitutional: He is oriented to person, place, and time. He appears well-developed and well-nourished. He does not appear ill. No distress.  HENT:  Head: Normocephalic and atraumatic.  Mouth/Throat: Oropharynx is clear and moist. No oropharyngeal exudate.  Eyes: Pupils are equal, round, and reactive to light. EOM are normal. No scleral icterus.  Cardiovascular: Normal rate, regular rhythm, normal heart sounds and intact distal pulses.  No murmur heard. Pulmonary/Chest: Effort normal and breath sounds normal. No respiratory distress. He has no wheezes.  Abdominal: Soft. Normal appearance and bowel sounds are normal. There is no tenderness. There is no CVA  tenderness.  Neurological: He is alert and oriented to person, place, and time.  Skin: Skin is warm and dry.  Psychiatric: He has a normal mood and affect. His behavior is normal.    ED Treatments / Results  Labs (all labs ordered are listed, but only abnormal results are displayed) Labs Reviewed  COMPREHENSIVE METABOLIC PANEL - Abnormal; Notable for the following components:      Result Value   ALT 10 (*)    All other components  within normal limits  URINALYSIS, ROUTINE W REFLEX MICROSCOPIC - Abnormal; Notable for the following components:   Hgb urine dipstick SMALL (*)    Ketones, ur 5 (*)    Squamous Epithelial / LPF 0-5 (*)    All other components within normal limits  LIPASE, BLOOD  CBC    EKG None  Radiology No results found.  Procedures Procedures (including critical care time)  Medications Ordered in ED Medications  gi cocktail (Maalox,Lidocaine,Donnatal) (30 mLs Oral Given 09/09/17 1721)     Initial Impression / Assessment and Plan / ED Course  I have reviewed the triage vital signs and the nursing notes.  Pertinent labs & imaging results that were available during my care of the patient were reviewed by me and considered in my medical decision making (see chart for details).     1701 Most likely viral etiology. Less likely appendicitis as no WBC and no TTP. Lipase WNL so less likely pancreatitis. UA without signs of infection, so less likely UTI. Small blood which could be suggestive of nephrolithiasis however periumbilical location of pain not typical of nephrolithiasis, and denies hematuria. Will give GI cocktail and PO challenge.   1733 Patient received GI cocktail and says he is feeling better. Will give PO challenge and okay for discharge if tolerates.   1742 Patient tolerated fluid well. Asking to go home. Stable for discharge.   Final Clinical Impressions(s) / ED Diagnoses   Final diagnoses:  Periumbilical abdominal pain    ED Discharge Orders        Ordered    ondansetron (ZOFRAN-ODT) 4 MG disintegrating tablet  Every 8 hours PRN     09/09/17 1735     Tarri Abernethy, MD, MPH PGY-3 [facility] Pager [phone]    Marquette Saa, MD 09/09/17 1743

## 2017-09-09 NOTE — ED Notes (Signed)
Pt states that he has had multiple episodes of vomiting for the past couple of days; pt states that he has not vomited today

## 2017-09-12 ENCOUNTER — Observation Stay (HOSPITAL_COMMUNITY)
Admission: EM | Admit: 2017-09-12 | Discharge: 2017-09-13 | Disposition: A | Discharge status: Home / Self Care | Payer: BLUE CROSS/BLUE SHIELD | Attending: Surgery | Admitting: Surgery

## 2017-09-12 ENCOUNTER — Encounter (HOSPITAL_COMMUNITY): Payer: Self-pay

## 2017-09-12 ENCOUNTER — Emergency Department (HOSPITAL_COMMUNITY): Payer: BLUE CROSS/BLUE SHIELD

## 2017-09-12 DIAGNOSIS — R1084 Generalized abdominal pain: Secondary | ICD-10-CM | POA: Diagnosis present

## 2017-09-12 DIAGNOSIS — F17210 Nicotine dependence, cigarettes, uncomplicated: Secondary | ICD-10-CM | POA: Insufficient documentation

## 2017-09-12 DIAGNOSIS — K561 Intussusception: Secondary | ICD-10-CM | POA: Diagnosis not present

## 2017-09-12 LAB — URINALYSIS, ROUTINE W REFLEX MICROSCOPIC
BILIRUBIN URINE: NEGATIVE
Bacteria, UA: NONE SEEN
GLUCOSE, UA: NEGATIVE mg/dL
Ketones, ur: NEGATIVE mg/dL
LEUKOCYTES UA: NEGATIVE
NITRITE: NEGATIVE
PH: 5 (ref 5.0–8.0)
Protein, ur: NEGATIVE mg/dL
SPECIFIC GRAVITY, URINE: 1.028 (ref 1.005–1.030)

## 2017-09-12 LAB — COMPREHENSIVE METABOLIC PANEL
ALK PHOS: 74 U/L (ref 38–126)
ALT: 9 U/L — AB (ref 17–63)
ANION GAP: 9 (ref 5–15)
AST: 22 U/L (ref 15–41)
Albumin: 4.2 g/dL (ref 3.5–5.0)
BILIRUBIN TOTAL: 0.9 mg/dL (ref 0.3–1.2)
BUN: 12 mg/dL (ref 6–20)
CALCIUM: 9.3 mg/dL (ref 8.9–10.3)
CO2: 23 mmol/L (ref 22–32)
Chloride: 104 mmol/L (ref 101–111)
Creatinine, Ser: 1.2 mg/dL (ref 0.61–1.24)
Glucose, Bld: 100 mg/dL — ABNORMAL HIGH (ref 65–99)
Potassium: 3.7 mmol/L (ref 3.5–5.1)
Sodium: 136 mmol/L (ref 135–145)
TOTAL PROTEIN: 6.8 g/dL (ref 6.5–8.1)

## 2017-09-12 LAB — CBC
HCT: 47.8 % (ref 39.0–52.0)
Hemoglobin: 16.4 g/dL (ref 13.0–17.0)
MCH: 32.2 pg (ref 26.0–34.0)
MCHC: 34.3 g/dL (ref 30.0–36.0)
MCV: 93.9 fL (ref 78.0–100.0)
PLATELETS: 241 10*3/uL (ref 150–400)
RBC: 5.09 MIL/uL (ref 4.22–5.81)
RDW: 12 % (ref 11.5–15.5)
WBC: 6.1 10*3/uL (ref 4.0–10.5)

## 2017-09-12 LAB — LIPASE, BLOOD: Lipase: 23 U/L (ref 11–51)

## 2017-09-12 MED ORDER — ENOXAPARIN SODIUM 40 MG/0.4ML ~~LOC~~ SOLN
40.0000 mg | SUBCUTANEOUS | Status: DC
  Administered 2017-09-12: 40 mg via SUBCUTANEOUS
  Filled 2017-09-12: qty 0.4

## 2017-09-12 MED ORDER — KCL IN DEXTROSE-NACL 20-5-0.45 MEQ/L-%-% IV SOLN
INTRAVENOUS | Status: DC
  Administered 2017-09-12 – 2017-09-13 (×3): via INTRAVENOUS
  Filled 2017-09-12 (×4): qty 1000

## 2017-09-12 MED ORDER — MORPHINE SULFATE (PF) 4 MG/ML IV SOLN
2.0000 mg | Freq: Once | INTRAVENOUS | Status: AC
  Administered 2017-09-12: 2 mg via INTRAVENOUS
  Filled 2017-09-12: qty 1

## 2017-09-12 MED ORDER — DIPHENHYDRAMINE HCL 25 MG PO CAPS: 25.0000 mg | ORAL_CAPSULE | Freq: Four times a day (QID) | ORAL | Status: DC | PRN

## 2017-09-12 MED ORDER — ZOLPIDEM TARTRATE 5 MG PO TABS: 5.0000 mg | ORAL_TABLET | Freq: Every evening | ORAL | Status: DC | PRN

## 2017-09-12 MED ORDER — SODIUM CHLORIDE 0.9 % IV BOLUS
1000.0000 mL | Freq: Once | INTRAVENOUS | Status: AC
  Administered 2017-09-12: 1000 mL via INTRAVENOUS

## 2017-09-12 MED ORDER — ONDANSETRON 4 MG PO TBDP: 4.0000 mg | ORAL_TABLET | Freq: Four times a day (QID) | ORAL | Status: DC | PRN

## 2017-09-12 MED ORDER — IOPAMIDOL (ISOVUE-300) INJECTION 61%
100.0000 mL | Freq: Once | INTRAVENOUS | Status: AC
  Administered 2017-09-12: 100 mL via INTRAVENOUS

## 2017-09-12 MED ORDER — HYDRALAZINE HCL 20 MG/ML IJ SOLN: 10.0000 mg | INTRAMUSCULAR | Status: DC | PRN

## 2017-09-12 MED ORDER — DIPHENHYDRAMINE HCL 50 MG/ML IJ SOLN: 25.0000 mg | Freq: Four times a day (QID) | INTRAMUSCULAR | Status: DC | PRN

## 2017-09-12 MED ORDER — ONDANSETRON HCL 4 MG/2ML IJ SOLN: 4.0000 mg | Freq: Four times a day (QID) | INTRAMUSCULAR | Status: DC | PRN

## 2017-09-12 MED ORDER — ACETAMINOPHEN 325 MG PO TABS
650.0000 mg | ORAL_TABLET | Freq: Four times a day (QID) | ORAL | Status: DC | PRN
Start: 2017-09-12 — End: 2017-09-13

## 2017-09-12 MED ORDER — PANTOPRAZOLE SODIUM 40 MG IV SOLR
40.0000 mg | Freq: Every day | INTRAVENOUS | Status: DC
  Administered 2017-09-12: 40 mg via INTRAVENOUS
  Filled 2017-09-12: qty 40

## 2017-09-12 MED ORDER — TRAMADOL HCL 50 MG PO TABS: 50.0000 mg | ORAL_TABLET | Freq: Four times a day (QID) | ORAL | Status: DC | PRN

## 2017-09-12 MED ORDER — SIMETHICONE 80 MG PO CHEW
40.0000 mg | CHEWABLE_TABLET | Freq: Four times a day (QID) | ORAL | Status: DC | PRN
  Filled 2017-09-12: qty 1

## 2017-09-12 MED ORDER — METOPROLOL TARTRATE 5 MG/5ML IV SOLN: 5.0000 mg | Freq: Four times a day (QID) | INTRAVENOUS | Status: DC | PRN

## 2017-09-12 MED ORDER — NICOTINE 21 MG/24HR TD PT24
21.0000 mg | MEDICATED_PATCH | Freq: Every day | TRANSDERMAL | Status: DC
  Filled 2017-09-12: qty 1

## 2017-09-12 MED ORDER — ACETAMINOPHEN 650 MG RE SUPP: 650.0000 mg | Freq: Four times a day (QID) | RECTAL | Status: DC | PRN

## 2017-09-12 MED ORDER — IOPAMIDOL (ISOVUE-300) INJECTION 61%
INTRAVENOUS | Status: AC
  Filled 2017-09-12: qty 100

## 2017-09-12 MED ORDER — MORPHINE SULFATE (PF) 4 MG/ML IV SOLN
1.0000 mg | INTRAVENOUS | Status: DC | PRN
  Administered 2017-09-12: 2 mg via INTRAVENOUS
  Filled 2017-09-12: qty 1

## 2017-09-12 NOTE — ED Triage Notes (Signed)
Patient complains of abdominal cramping and pain with intermittent vomiting and diarrhea x 1 week. Patient alert and oriented, no vomiting on arrival. Reports decreased appetite with same

## 2017-09-12 NOTE — ED Provider Notes (Signed)
[facility] EMERGENCY DEPARTMENT Provider Note   CSN: [id number] Arrival date & time: 09/12/17  0631     History   Chief Complaint Chief Complaint  Patient presents with  . Abdominal Pain    HPI David [name] is a 52 y.o. male presenting to the ED for subsumed visit regarding intermittent abdominal cramping times 1 week.  Patient was evaluated in this ED on 09/09/2017 for similar complaint, diagnosed with viral gastroenteritis and discharge with symptomatic management.  Patient presents today for persistent symptoms which include generalized intermittent abdominal cramping.  States the pain is severe at times, and worse at night which causes him to sweat.  Patient states he has had intermittent nonbilious nonbloody emesis throughout the week, and is actively vomiting today.  States he has not had diarrhea in a few days, and has had some small formed bowel movements yesterday.  Has been taking OTC antacid medications for symptoms, and states the Zofran he was prescribed has not provided any relief of nausea or vomiting.  Denies history of abdominal surgeries.  Denies fever, chills, urinary symptoms, or other complaints.  The history is provided by the patient.    History reviewed. No pertinent past medical history.  Patient Active Problem List   Diagnosis Date Noted  . Intussusception of jejunum (HCC) 09/12/2017  . Tailor's bunion of both feet 01/06/2017  . Onychomycosis 01/06/2017  . Foot callus 01/05/2017  . Foreign body in foot 01/05/2017    Past Surgical History:  Procedure Laterality Date  . EXTERNAL EAR SURGERY          Home Medications    Prior to Admission medications   Medication Sig Start Date End Date Taking? Authorizing Provider  NONFORMULARY OR COMPOUNDED ITEM Apply 1-2 g topically daily. [employer] Nail lacquer: Fluconazole 2%, Terbinafine 1%, DMSO 01/06/17   Vivi Barrack, DPM  ondansetron (ZOFRAN-ODT) 4 MG disintegrating tablet Take 1  tablet (4 mg total) by mouth every 8 (eight) hours as needed for nausea or vomiting. 09/09/17   Marquette Saa, MD  polyethylene glycol [facility] / Ethelene Hal) packet Take 17 g by mouth daily. 04/01/17   Blane Ohara, MD    Family History History reviewed. No pertinent family history.  Social History Social History   Tobacco Use  . Smoking status: Current Every Day Smoker    Packs/day: 0.50    Types: Cigarettes  . Smokeless tobacco: Never Used  Substance Use Topics  . Alcohol use: No  . Drug use: Yes    Types: Marijuana     Allergies   Patient has no known allergies.   Review of Systems Review of Systems  Constitutional: Positive for appetite change. Negative for chills and fever.  Gastrointestinal: Positive for abdominal pain, nausea and vomiting. Negative for blood in stool and constipation.  Genitourinary: Negative for dysuria and frequency.  All other systems reviewed and are negative.    Physical Exam Updated Vital Signs BP (!) 145/90   Pulse 63   Temp 98.6 F (37 C) (Oral)   Resp 19   Ht 5\' 11"  (1.803 m)   Wt 77.1 kg (170 lb)   SpO2 96%   BMI 23.71 kg/m   Physical Exam  Constitutional: He appears well-developed and well-nourished.  Non-toxic appearance. He does not appear ill. No distress.  HENT:  Head: Normocephalic and atraumatic.  Mouth/Throat: Oropharynx is clear and moist.  Eyes: Conjunctivae are normal.  Cardiovascular: Normal rate, regular rhythm and intact distal pulses.  Pulmonary/Chest:  Effort normal and breath sounds normal. No respiratory distress.  Abdominal: Soft. Normal appearance and bowel sounds are normal. He exhibits no distension and no mass. There is generalized tenderness. There is no rebound.  Neurological: He is alert.  Skin: Skin is warm.  Psychiatric: He has a normal mood and affect. His behavior is normal.  Nursing note and vitals reviewed.    ED Treatments / Results  Labs (all labs ordered are listed, but only  abnormal results are displayed) Labs Reviewed  COMPREHENSIVE METABOLIC PANEL - Abnormal; Notable for the following components:      Result Value   Glucose, Bld 100 (*)    ALT 9 (*)    All other components within normal limits  URINALYSIS, ROUTINE W REFLEX MICROSCOPIC - Abnormal; Notable for the following components:   Color, Urine AMBER (*)    APPearance HAZY (*)    Hgb urine dipstick SMALL (*)    Squamous Epithelial / LPF 0-5 (*)    All other components within normal limits  LIPASE, BLOOD  CBC    EKG None  Radiology Ct Abdomen Pelvis W Contrast  Result Date: 09/12/2017 CLINICAL DATA:  Acute generalized abdominal pain. EXAM: CT ABDOMEN AND PELVIS WITH CONTRAST TECHNIQUE: Multidetector CT imaging of the abdomen and pelvis was performed using the standard protocol following bolus administration of intravenous contrast. CONTRAST:  ISOVUE-300 IOPAMIDOL (ISOVUE-300) INJECTION 61% COMPARISON:  None. FINDINGS: Lower chest: No acute abnormality. Hepatobiliary: No focal liver abnormality is seen. No gallstones, gallbladder wall thickening, or biliary dilatation. Pancreas: Unremarkable. No pancreatic ductal dilatation or surrounding inflammatory changes. Spleen: Normal in size without focal abnormality. Adrenals/Urinary Tract: Adrenal glands appear normal. Right renal cyst is noted. No hydronephrosis or renal obstruction is noted. No renal or ureteral calculi are noted. Urinary bladder is unremarkable. Stomach/Bowel: The stomach appears normal. The appendix appears normal. There is no evidence of bowel obstruction or inflammation. Probable jejunal intussusception is noted in left upper quadrant measuring 4 cm in length. Vascular/Lymphatic: No significant vascular findings are present. No enlarged abdominal or pelvic lymph nodes. Reproductive: Prostate is unremarkable. Other: No abdominal wall hernia or abnormality. No abdominopelvic ascites. Musculoskeletal: No acute or significant osseous  findings. IMPRESSION: Jejunal intussusception is noted in the left upper quadrant measuring 4 cm in length. No definite bowel dilatation or inflammation is noted. Electronically Signed   By: Lupita Raider, M.D.   On: 09/12/2017 12:12    Procedures Procedures (including critical care time)  Medications Ordered in ED Medications  iopamidol (ISOVUE-300) 61 % injection (has no administration in time range)  morphine 4 MG/ML injection 1-2 mg (has no administration in time range)  sodium chloride 0.9 % bolus 1,000 mL (0 mLs Intravenous Stopped 09/12/17 1442)  morphine 4 MG/ML injection 2 mg (2 mg Intravenous Given 09/12/17 1046)  iopamidol (ISOVUE-300) 61 % injection 100 mL (100 mLs Intravenous Contrast Given 09/12/17 1145)     Initial Impression / Assessment and Plan / ED Course  I have reviewed the triage vital signs and the nursing notes.  Pertinent labs & imaging results that were available during my care of the patient were reviewed by me and considered in my medical decision making (see chart for details).  Clinical Course as of Sep 12 1448  Tue Sep 12, 2017  1405 Gen surgery to admit   [JR]    Clinical Course User Index [JR] Daaiel Starlin, [place], PA-C    Patient presenting to the ED for 1 week of abdominal  pain with nausea vomiting and diarrhea.  Patient was evaluated in the ED on 09/09/2017, and diagnosed with viral gastroenteritis, discharged with symptomatic management.  Patient states his symptoms persisted but the past few days.  Nausea was unrelieved by Zofran.  Abdominal pain worse at night and is intermittent.  On exam, abdomen is soft, however with diffuse tenderness.  Afebrile, hemodynamically stable.  Normal white blood cell count.  CMP unremarkable.  UA negative for infection.  Given patient's persistent symptoms without improvement and tenderness on exam, CT ordered.   CT with findings showing jejunal intussusception in the left upper quadrant measuring about 4 cm.  General  surgery consulted, who evaluated patient and will admit for medical management and observation.  Patient aware and agreeable to plan.  The patient appears reasonably stabilized for admission considering the current resources, flow, and capabilities available in the ED at this time, and I doubt any other [facility] requiring further screening and/or treatment in the ED prior to admission.  Final Clinical Impressions(s) / ED Diagnoses   Final diagnoses:  Intussusception of jejunum [facility])    ED Discharge Orders    None       Nicholi Ghuman, [place] N, PA-C 09/12/17 1450    Linwood Dibbles, MD 09/13/17 [phone]

## 2017-09-12 NOTE — H&P (Signed)
David [name] 03/22/1966  [id number].    Chief Complaint/Reason for Consult: Jejunal intussusception  HPI:  This is an otherwise healthy 52 year old black male who presented to the emergency department approximately 4 days ago secondary to epigastric abdominal pain.  He was having some nausea and vomiting at that time.  Upon initial evaluation, he was felt to have gastroenteritis and was discharged home with Zofran as needed for nausea.  He states he has been essentially unable to eat much in the way of solid food for the last week.  He has been able to  drink some liquids.  However, he is still intermittently having abdominal pain with nausea and vomiting.  He is passing some flatus.  He last had a bowel movement around 3 days ago.  He admits to subjective fevers with sweating but nothing definitive.  Nothing has made his pain or other symptoms any better.  He presents back to the emergency department today for further evaluation.  Today he was found to have normal labs.  He did have a CT scan of his abdomen and pelvis which revealed a jejunal intussusception.  We have been asked to evaluate the patient for further recommendations.  He currently is not having any abdominal pain at this time.  His last episode of emesis was around 0600 a.m. this morning.  He denies any blood in his vomitus or his stool.  ROS: ROS : Please see HPI, otherwise all other systems have been reviewed and are negative.  He does wear Band-Aids over his left ear as he does not like the appearance of this year.  He has had plastic surgery before.  History reviewed. No pertinent family history.  History reviewed. No pertinent past medical history.  Past Surgical History:  Procedure Laterality Date  . EXTERNAL EAR SURGERY      Social History:  reports that he has been smoking cigarettes.  He has been smoking about 0.50 packs per day. He has never used smokeless tobacco. He reports that he has current or past drug  history. Drug: Marijuana. He reports that he does not drink alcohol.  Allergies: No Known Allergies   (Not in a hospital admission)   Physical Exam: Blood pressure (!) 163/96, pulse 86, temperature 98.6 F (37 C), temperature source Oral, resp. rate 17, height _0  (1.803 m), weight 77.1 kg (170 lb), SpO2 94 %. General: pleasant, WD, WN black male who is laying in bed in NAD HEENT: head is normocephalic, atraumatic.  Sclera are noninjected.  PERRL.  Ears and nose without any masses or lesions.  Left ear with band-aids present.  Mouth is pink and moist Heart: regular, rate, and rhythm.  Normal s1,s2. No obvious murmurs, gallops, or rubs noted.  Palpable radial and pedal pulses bilaterally Lungs: CTAB, no wheezes, rhonchi, or rales noted.  Respiratory effort nonlabored Abd: soft, NT, ND, +BS, no masses, hernias, or organomegaly MS: all 4 extremities are symmetrical with no cyanosis, clubbing, or edema. Skin: warm and dry with no masses, lesions, or rashes Psych: A&Ox3 with an appropriate affect.   Results for orders placed or performed during the hospital encounter of 09/12/17 (from the past 48 hour(s))  Lipase, blood     Status: None   Collection Time: 09/12/17  7:28 AM  Result Value Ref Range   Lipase 23 11 - 51 U/L    Comment: Performed at [facility], [place] [address], [place], [place] [address]  Comprehensive metabolic panel  Status: Abnormal   Collection Time: 09/12/17  7:28 AM  Result Value Ref Range   Sodium 136 135 - 145 mmol/L   Potassium 3.7 3.5 - 5.1 mmol/L   Chloride 104 101 - 111 mmol/L   CO2 23 22 - 32 mmol/L   Glucose, Bld 100 (H) 65 - 99 mg/dL   BUN 12 6 - 20 mg/dL   Creatinine, Ser 1.20 0.61 - 1.24 mg/dL   Calcium 9.3 8.9 - 10.3 mg/dL   Total Protein 6.8 6.5 - 8.1 g/dL   Albumin 4.2 3.5 - 5.0 g/dL   AST 22 15 - 41 U/L   ALT 9 (L) 17 - 63 U/L   Alkaline Phosphatase 74 38 - 126 U/L   Total Bilirubin 0.9 0.3 - 1.2 mg/dL   GFR calc non Af Amer >60 >60  mL/min   GFR calc Af Amer >60 >60 mL/min    Comment: (NOTE) The eGFR has been calculated using the CKD EPI equation. This calculation has not been validated in all clinical situations. eGFR's persistently <60 mL/min signify possible Chronic Kidney Disease.    Anion gap 9 5 - 15    Comment: Performed at [facility] [address] [place] [address]  CBC     Status: None   Collection Time: 09/12/17  7:28 AM  Result Value Ref Range   WBC 6.1 4.0 - 10.5 K/uL   RBC 5.09 4.22 - 5.81 MIL/uL   Hemoglobin 16.4 13.0 - 17.0 g/dL   HCT 47.8 39.0 - 52.0 %   MCV 93.9 78.0 - 100.0 fL   MCH 32.2 26.0 - 34.0 pg   MCHC 34.3 30.0 - 36.0 g/dL   RDW 12.0 11.5 - 15.5 %   Platelets 241 150 - 400 K/uL    Comment: Performed at [facility], [place] [address], [place], [place] [address]  Urinalysis, Routine w reflex microscopic     Status: Abnormal   Collection Time: 09/12/17  7:28 AM  Result Value Ref Range   Color, Urine AMBER (A) YELLOW    Comment: BIOCHEMICALS MAY BE AFFECTED BY COLOR   APPearance HAZY (A) CLEAR   Specific Gravity, Urine 1.028 1.005 - 1.030   pH 5.0 5.0 - 8.0   Glucose, UA NEGATIVE NEGATIVE mg/dL   Hgb urine dipstick SMALL (A) NEGATIVE   Bilirubin Urine NEGATIVE NEGATIVE   Ketones, ur NEGATIVE NEGATIVE mg/dL   Protein, ur NEGATIVE NEGATIVE mg/dL   Nitrite NEGATIVE NEGATIVE   Leukocytes, UA NEGATIVE NEGATIVE   RBC / HPF 0-5 0 - 5 RBC/hpf   WBC, UA 0-5 0 - 5 WBC/hpf   Bacteria, UA NONE SEEN NONE SEEN   Squamous Epithelial / LPF 0-5 (A) NONE SEEN   Mucus PRESENT    Hyaline Casts, UA PRESENT     Comment: Performed at [facility], [place] [address], [place], [place] [address]   Ct Abdomen Pelvis W Contrast  Result Date: 09/12/2017 CLINICAL DATA:  Acute generalized abdominal pain. EXAM: CT ABDOMEN AND PELVIS WITH CONTRAST TECHNIQUE: Multidetector CT imaging of the abdomen and pelvis was performed using the standard protocol following bolus administration of  intravenous contrast. CONTRAST:  162m ISOVUE-300 IOPAMIDOL (ISOVUE-300) INJECTION 61% COMPARISON:  None. FINDINGS: Lower chest: No acute abnormality. Hepatobiliary: No focal liver abnormality is seen. No gallstones, gallbladder wall thickening, or biliary dilatation. Pancreas: Unremarkable. No pancreatic ductal dilatation or surrounding inflammatory changes. Spleen: Normal in size without focal abnormality. Adrenals/Urinary Tract: Adrenal glands  appear normal. Right renal cyst is noted. No hydronephrosis or renal obstruction is noted. No renal or ureteral calculi are noted. Urinary bladder is unremarkable. Stomach/Bowel: The stomach appears normal. The appendix appears normal. There is no evidence of bowel obstruction or inflammation. Probable jejunal intussusception is noted in left upper quadrant measuring 4 cm in length. Vascular/Lymphatic: No significant vascular findings are present. No enlarged abdominal or pelvic lymph nodes. Reproductive: Prostate is unremarkable. Other: No abdominal wall hernia or abnormality. No abdominopelvic ascites. Musculoskeletal: No acute or significant osseous findings. IMPRESSION: Jejunal intussusception is noted in the left upper quadrant measuring 4 cm in length. No definite bowel dilatation or inflammation is noted. Electronically Signed   By: Marijo Conception, M.D.   On: 09/12/2017 12:12      Assessment/Plan Jejunal intussusception The patient has been having intermittent abdominal symptoms such as pain, nausea, and vomiting for the last week.  He has had minimal oral intake secondary to the symptoms.  His symptoms are currently improving; however, because of the previous symptoms we will plan on admission for observation.  He will be started on ice chips and sips of clear liquids.  If he tolerates this then we will advance his diet as he tolerates with the hope of avoiding surgical intervention.  However if he is unable to tolerate a diet, he still may require surgical  intervention while he is here.    Tobacco abuse Nicotine patch   FEN - IVFs 125cc/hr/sips and chips VTE - Lovenox/SCDs ID - none  Henreitta Cea, [facility] 09/12/2017, 1:36 PM Pager: [phone]

## 2017-09-12 NOTE — ED Notes (Signed)
ED Provider at bedside. 

## 2017-09-13 ENCOUNTER — Encounter (HOSPITAL_COMMUNITY): Payer: Self-pay | Admitting: *Deleted

## 2017-09-13 ENCOUNTER — Other Ambulatory Visit: Payer: Self-pay

## 2017-09-13 LAB — BASIC METABOLIC PANEL
Anion gap: 9 (ref 5–15)
BUN: 9 mg/dL (ref 6–20)
CO2: 24 mmol/L (ref 22–32)
CREATININE: 1.08 mg/dL (ref 0.61–1.24)
Calcium: 8.6 mg/dL — ABNORMAL LOW (ref 8.9–10.3)
Chloride: 102 mmol/L (ref 101–111)
Glucose, Bld: 95 mg/dL (ref 65–99)
POTASSIUM: 3.9 mmol/L (ref 3.5–5.1)
SODIUM: 135 mmol/L (ref 135–145)

## 2017-09-13 LAB — CBC
HEMATOCRIT: 43.2 % (ref 39.0–52.0)
Hemoglobin: 14.5 g/dL (ref 13.0–17.0)
MCH: 31.4 pg (ref 26.0–34.0)
MCHC: 33.6 g/dL (ref 30.0–36.0)
MCV: 93.5 fL (ref 78.0–100.0)
PLATELETS: 223 10*3/uL (ref 150–400)
RBC: 4.62 MIL/uL (ref 4.22–5.81)
RDW: 12.1 % (ref 11.5–15.5)
WBC: 5.8 10*3/uL (ref 4.0–10.5)

## 2017-09-13 LAB — HIV ANTIBODY (ROUTINE TESTING W REFLEX): HIV SCREEN 4TH GENERATION: NONREACTIVE

## 2017-09-13 MED ORDER — ACETAMINOPHEN 325 MG PO TABS: 650.0000 mg | ORAL_TABLET | Freq: Four times a day (QID) | ORAL | Status: DC | PRN

## 2017-09-13 NOTE — Progress Notes (Signed)
Patient given discharge instructions. Patient verbalized understanding and left unit in stable condition.  

## 2017-09-13 NOTE — Progress Notes (Signed)
[facility] Progress Note     Subjective: CC:  Denies abdominal pain. Denies nausea or vomiting since yesterday morning. Tolerated clears for dinner/breakfast without recurrence of symptoms. Endorses large amounts of flatus but denies BM in 3-4 days.   Objective: Vital signs in last 24 hours: Temp:  [98.6 F (37 C)] 98.6 F (37 C) (04/10 0512) Pulse Rate:  [55-105] 66 (04/10 0512) Resp:  [16-21] 18 (04/10 0512) BP: (117-177)/(73-102) 117/80 (04/10 0512) SpO2:  [94 %-100 %] 100 % (04/10 0512) Weight:  [78 kg (172 lb)] 78 kg (172 lb) (04/10 0512) Last BM Date: 09/10/17(Pt don't know exaclty)  Intake/Output from previous day: 04/09 0701 - 04/10 0700 In: 1104.2 [I.V.:1104.2] Out: -  Intake/Output this shift: No intake/output data recorded.  PE: Gen:  Alert, NAD, pleasant Card:  Regular rate and rhythm, pedal pulses 2+ BL Pulm:  Normal effort, clear to auscultation bilaterally Abd: Soft, non-tender, non-distended, bowel sounds present in all 4 quadrants Skin: warm and dry, no rashes  Psych: A&Ox3   Lab Results:  Recent Labs    09/12/17 0728 09/13/17 0535  WBC 6.1 5.8  HGB 16.4 14.5  HCT 47.8 43.2  PLT 241 223   BMET Recent Labs    09/12/17 0728 09/13/17 0535  NA 136 135  K 3.7 3.9  CL 104 102  CO2 23 24  GLUCOSE 100* 95  BUN 12 9  CREATININE 1.20 1.08  CALCIUM 9.3 8.6*   PT/INR No results for input(s): LABPROT, INR in the last 72 hours. CMP     Component Value Date/Time   NA 135 09/13/2017 0535   K 3.9 09/13/2017 0535   CL 102 09/13/2017 0535   CO2 24 09/13/2017 0535   GLUCOSE 95 09/13/2017 0535   BUN 9 09/13/2017 0535   CREATININE 1.08 09/13/2017 0535   CALCIUM 8.6 (L) 09/13/2017 0535   PROT 6.8 09/12/2017 0728   ALBUMIN 4.2 09/12/2017 0728   AST 22 09/12/2017 0728   ALT 9 (L) 09/12/2017 0728   ALKPHOS 74 09/12/2017 0728   BILITOT 0.9 09/12/2017 0728   GFRNONAA >60 09/13/2017 0535   GFRAA >60 09/13/2017 0535   Lipase      Component Value Date/Time   LIPASE 23 09/12/2017 0728       Studies/Results: Ct Abdomen Pelvis W Contrast  Result Date: 09/12/2017 CLINICAL DATA:  Acute generalized abdominal pain. EXAM: CT ABDOMEN AND PELVIS WITH CONTRAST TECHNIQUE: Multidetector CT imaging of the abdomen and pelvis was performed using the standard protocol following bolus administration of intravenous contrast. CONTRAST:  ISOVUE-300 IOPAMIDOL (ISOVUE-300) INJECTION 61% COMPARISON:  None. FINDINGS: Lower chest: No acute abnormality. Hepatobiliary: No focal liver abnormality is seen. No gallstones, gallbladder wall thickening, or biliary dilatation. Pancreas: Unremarkable. No pancreatic ductal dilatation or surrounding inflammatory changes. Spleen: Normal in size without focal abnormality. Adrenals/Urinary Tract: Adrenal glands appear normal. Right renal cyst is noted. No hydronephrosis or renal obstruction is noted. No renal or ureteral calculi are noted. Urinary bladder is unremarkable. Stomach/Bowel: The stomach appears normal. The appendix appears normal. There is no evidence of bowel obstruction or inflammation. Probable jejunal intussusception is noted in left upper quadrant measuring 4 cm in length. Vascular/Lymphatic: No significant vascular findings are present. No enlarged abdominal or pelvic lymph nodes. Reproductive: Prostate is unremarkable. Other: No abdominal wall hernia or abnormality. No abdominopelvic ascites. Musculoskeletal: No acute or significant osseous findings. IMPRESSION: Jejunal intussusception is noted in the left upper quadrant measuring 4 cm in length. No definite  bowel dilatation or inflammation is noted. Electronically Signed   By: Lupita Raider, M.D.   On: 09/12/2017 12:12    Anti-infectives: Anti-infectives (From admission, onward)   None     Assessment/Plan Abdominal pain - resolved  Nausea/Vomiting - resolved  Abnormal CT scan of the abdomen suggesting jejunal intussusception  -  abdominal pain, nausea, vomiting resolved. Tolerating clear liquids.  - advance diet as tolerated and re-check this afternoon; if patient symptoms remain improved he may be able to be discharged home this afternoon. If symptoms recur, may have to discuss role of diagnostic laparoscopy or further workup.  FEN: Clear liquids, advance to SOFT VTE: SCD's, Lovenox ID: none   LOS: 0 days    David [name] , [facility] 09/13/2017, 9:29 AM Pager: [phone] Consults: [phone] Mon-Fri 7:00 am-4:30 pm Sat-Sun 7:00 am-11:30 am

## 2017-09-20 NOTE — Discharge Summary (Signed)
[facility] Discharge Summary   Patient ID: MYKAH BARDIN MRN: [id number] DOB/AGE: [birth year] 52 y.o.  Admit date: 09/12/2017 Discharge date: 09/20/2017  Discharge Diagnosis Patient Active Problem List   Diagnosis Date Noted  . Intussusception of jejunum (HCC) 09/12/2017  . Tailor's bunion of both feet 01/06/2017  . Onychomycosis 01/06/2017  . Foot callus 01/05/2017  . Foreign body in foot 01/05/2017    Consultants N/A   Imaging: CT ABD PELVIS (09/12/17) - IMPRESSION: Jejunal intussusception is noted in the left upper quadrant measuring 4 cm in length. No definite bowel dilatation or inflammation is noted.  Procedures none  Hospital Course:  52 y/o AA male who presented to [facility] with a 4-day history of epigastric abdominal pain associated with vomiting. ED workup revealed normal labs but a CT scan as above concerning for possible intussusception. The patients symptoms started to improved in the ED but he was admitted to the hospital overnight for observation and judicious advancement of diet. On hospital day #1 the patients pain and nausea resolved, he tolerated a liquid followed by a solid diet, vitals were stable, mobilizing, and stable for discharge home.   Allergies as of 09/13/2017   No Known Allergies     Medication List    TAKE these medications   acetaminophen 325 MG tablet Commonly known as:  TYLENOL Take 2 tablets (650 mg total) by mouth every 6 (six) hours as needed for mild pain (or temp > 100).         Signed: Hosie Spangle, [facility] 09/20/2017, 3:44 PM Pager: [phone] Consults: [phone] Mon-Fri 7:00 am-4:30 pm Sat-Sun 7:00 am-11:30 am

## 2018-01-02 ENCOUNTER — Other Ambulatory Visit: Payer: Self-pay

## 2018-01-02 ENCOUNTER — Encounter (HOSPITAL_COMMUNITY): Payer: Self-pay | Admitting: Emergency Medicine

## 2018-01-02 ENCOUNTER — Emergency Department (HOSPITAL_COMMUNITY): Payer: BLUE CROSS/BLUE SHIELD

## 2018-01-02 ENCOUNTER — Emergency Department (HOSPITAL_COMMUNITY)
Admission: EM | Admit: 2018-01-02 | Discharge: 2018-01-02 | Disposition: A | Discharge status: Home / Self Care | Payer: BLUE CROSS/BLUE SHIELD | Attending: Emergency Medicine | Admitting: Emergency Medicine

## 2018-01-02 DIAGNOSIS — R1033 Periumbilical pain: Secondary | ICD-10-CM | POA: Diagnosis present

## 2018-01-02 DIAGNOSIS — F17210 Nicotine dependence, cigarettes, uncomplicated: Secondary | ICD-10-CM | POA: Diagnosis not present

## 2018-01-02 DIAGNOSIS — Z79899 Other long term (current) drug therapy: Secondary | ICD-10-CM | POA: Insufficient documentation

## 2018-01-02 LAB — COMPREHENSIVE METABOLIC PANEL
ALBUMIN: 4.4 g/dL (ref 3.5–5.0)
ALT: 9 U/L (ref 0–44)
AST: 21 U/L (ref 15–41)
Alkaline Phosphatase: 75 U/L (ref 38–126)
Anion gap: 12 (ref 5–15)
BUN: 17 mg/dL (ref 6–20)
CO2: 25 mmol/L (ref 22–32)
CREATININE: 1.34 mg/dL — AB (ref 0.61–1.24)
Calcium: 9.3 mg/dL (ref 8.9–10.3)
Chloride: 101 mmol/L (ref 98–111)
GFR calc Af Amer: >60 mL/min (ref >60)
GFR, EST NON AFRICAN AMERICAN: 59 mL/min — AB (ref >60)
GLUCOSE: 104 mg/dL — AB (ref 70–99)
POTASSIUM: 4 mmol/L (ref 3.5–5.1)
SODIUM: 138 mmol/L (ref 135–145)
Total Bilirubin: 1.4 mg/dL — ABNORMAL HIGH (ref 0.3–1.2)
Total Protein: 7.5 g/dL (ref 6.5–8.1)

## 2018-01-02 LAB — URINALYSIS, ROUTINE W REFLEX MICROSCOPIC
Bilirubin Urine: NEGATIVE
Glucose, UA: NEGATIVE mg/dL
Ketones, ur: 20 mg/dL — AB
LEUKOCYTES UA: NEGATIVE
Nitrite: NEGATIVE
PROTEIN: 30 mg/dL — AB
Specific Gravity, Urine: 1.03 (ref 1.005–1.030)
pH: 5 (ref 5.0–8.0)

## 2018-01-02 LAB — CBC
HEMATOCRIT: 44.5 % (ref 39.0–52.0)
Hemoglobin: 15.2 g/dL (ref 13.0–17.0)
MCH: 31.9 pg (ref 26.0–34.0)
MCHC: 34.2 g/dL (ref 30.0–36.0)
MCV: 93.5 fL (ref 78.0–100.0)
PLATELETS: 250 10*3/uL (ref 150–400)
RBC: 4.76 MIL/uL (ref 4.22–5.81)
RDW: 11.7 % (ref 11.5–15.5)
WBC: 6.3 10*3/uL (ref 4.0–10.5)

## 2018-01-02 LAB — LIPASE, BLOOD: LIPASE: 31 U/L (ref 11–51)

## 2018-01-02 MED ORDER — OXYCODONE-ACETAMINOPHEN 5-325 MG PO TABS
1.0000 | ORAL_TABLET | Freq: Once | ORAL | Status: AC
  Administered 2018-01-02: 1 via ORAL
  Filled 2018-01-02: qty 1

## 2018-01-02 MED ORDER — OXYCODONE-ACETAMINOPHEN 5-325 MG PO TABS: 1.0000 | ORAL_TABLET | Freq: Four times a day (QID) | ORAL | 0 refills | Status: DC | PRN

## 2018-01-02 MED ORDER — ONDANSETRON HCL 4 MG PO TABS
4.0000 mg | ORAL_TABLET | Freq: Four times a day (QID) | ORAL | 0 refills | Status: DC
Start: 2018-01-02 — End: 2019-08-14

## 2018-01-02 MED ORDER — IOHEXOL 300 MG/ML  SOLN
100.0000 mL | Freq: Once | INTRAMUSCULAR | Status: AC | PRN
  Administered 2018-01-02: 100 mL via INTRAVENOUS

## 2018-01-02 MED ORDER — ONDANSETRON 4 MG PO TBDP
4.0000 mg | ORAL_TABLET | Freq: Once | ORAL | Status: AC
  Administered 2018-01-02: 4 mg via ORAL
  Filled 2018-01-02: qty 1

## 2018-01-02 NOTE — ED Notes (Signed)
Patient transported to CT 

## 2018-01-02 NOTE — ED Notes (Signed)
Pt verbalizes understanding of d/c instructions. Pt received prescriptions. Pt ambulatory at d/c with all belongings and with family.   

## 2018-01-02 NOTE — ED Notes (Signed)
ED Provider at bedside. 

## 2018-01-02 NOTE — ED Provider Notes (Signed)
[name] EMERGENCY DEPARTMENT Provider Note   CSN: [id number] Arrival date & time: 01/02/18  1624     History   Chief Complaint Chief Complaint  Patient presents with  . Abdominal Pain    HPI [name] is a 52 y.o. male.  HPI   52 year old male presents today with complaints of abdominal pain. Patient notes waxing and waning abdominal pain over the last week. He notes this is periumbilical. Patient notes some diarrhea and constipation over the last week. Denies any nausea or vomiting. He denies any fever. Patient notes this feels similar to previous episodes of abdominal pain and history of intussusception.   History reviewed. No pertinent past medical history.  Patient Active Problem List   Diagnosis Date Noted  . Intussusception of jejunum (HCC) 09/12/2017  . Tailor's bunion of both feet 01/06/2017  . Onychomycosis 01/06/2017  . Foot callus 01/05/2017  . Foreign body in foot 01/05/2017    Past Surgical History:  Procedure Laterality Date  . EXTERNAL EAR SURGERY          Home Medications    Prior to Admission medications   Medication Sig Start Date End Date Taking? Authorizing Provider  acetaminophen (TYLENOL) 325 MG tablet Take 2 tablets (650 mg total) by mouth every 6 (six) hours as needed for mild pain (or temp > 100). 09/13/17  Yes Simaan, Francine Graven, PA-C  ibuprofen (ADVIL,MOTRIN) 200 MG tablet Take 200 mg by mouth every 8 (eight) hours as needed for mild pain or moderate pain.   Yes [provider]  ondansetron (ZOFRAN) 4 MG tablet Take 1 tablet (4 mg total) by mouth every 6 (six) hours. 01/02/18   Camillo Quadros, Tinnie Gens, PA-C  oxyCODONE-acetaminophen (PERCOCET/ROXICET) 5-325 MG tablet Take 1 tablet by mouth every 6 (six) hours as needed for severe pain. 01/02/18   Eyvonne Mechanic, PA-C    Family History History reviewed. No pertinent family history.  Social History Social History   Tobacco Use  . Smoking status: Current  Every Day Smoker    Packs/day: 0.50    Types: Cigarettes  . Smokeless tobacco: Never Used  Substance Use Topics  . Alcohol use: No  . Drug use: Yes    Types: Marijuana     Allergies   Patient has no known allergies.   Review of Systems Review of Systems  All other systems reviewed and are negative.   Physical Exam Updated Vital Signs BP (!) 150/73 (BP Location: Left Arm)   Pulse 73   Temp 98.3 F (36.8 C) (Oral)   Resp 16   Ht 5\' 11"  (1.803 m)   Wt 77.1 kg (170 lb)   SpO2 98%   BMI 23.71 kg/m   Physical Exam  Constitutional: He is oriented to person, place, and time. He appears well-developed and well-nourished.  HENT:  Head: Normocephalic and atraumatic.  Eyes: Pupils are equal, round, and reactive to light. Conjunctivae are normal. Right eye exhibits no discharge. Left eye exhibits no discharge. No scleral icterus.  Neck: Normal range of motion. No JVD present. No tracheal deviation present.  Pulmonary/Chest: Effort normal. No stridor.  Abdominal:  Tenderness palpation of periumbilical region remainder of abdomen soft nontender no rebound or guarding  Neurological: He is alert and oriented to person, place, and time. Coordination normal.  Psychiatric: He has a normal mood and affect. His behavior is normal. Judgment and thought content normal.  Nursing note and vitals reviewed.   ED Treatments / Results  Labs (all  labs ordered are listed, but only abnormal results are displayed) Labs Reviewed  COMPREHENSIVE METABOLIC PANEL - Abnormal; Notable for the following components:      Result Value   Glucose, Bld 104 (*)    Creatinine, Ser 1.34 (*)    Total Bilirubin 1.4 (*)    GFR calc non Af Amer 59 (*)    All other components within normal limits  URINALYSIS, ROUTINE W REFLEX MICROSCOPIC - Abnormal; Notable for the following components:   APPearance HAZY (*)    Hgb urine dipstick SMALL (*)    Ketones, ur 20 (*)    Protein, ur 30 (*)    Bacteria, UA RARE (*)     All other components within normal limits  LIPASE, BLOOD  CBC    EKG None  Radiology Ct Abdomen Pelvis W Contrast  Result Date: 01/02/2018 CLINICAL DATA:  Abdominal pain, vomiting EXAM: CT ABDOMEN AND PELVIS WITH CONTRAST TECHNIQUE: Multidetector CT imaging of the abdomen and pelvis was performed using the standard protocol following bolus administration of intravenous contrast. CONTRAST:  OMNIPAQUE IOHEXOL 300 MG/ML  SOLN COMPARISON:  09/12/2017 FINDINGS: Lower chest: Lung bases are clear. Hepatobiliary: Liver is within normal limits. Gallbladder is unremarkable. No intrahepatic or extrahepatic ductal dilatation. Pancreas: Within normal limits. Spleen: Within normal limits. Adrenals/Urinary Tract: Adrenal glands are within normal limits. 14 mm right lower pole renal cyst, benign (Bosniak I). Left kidney is within normal limits. No hydronephrosis. Bladder is mildly thick-walled although underdistended. Stomach/Bowel: Stomach is within normal limits. No evidence of bowel obstruction. Appendix is not discretely visualized.  No evidence of appendicitis. No colonic wall thickening or inflammatory changes. Vascular/Lymphatic: No evidence of abdominal aortic aneurysm. No suspicious abdominopelvic lymphadenopathy. Reproductive: Prostate is unremarkable. Other: No abdominopelvic ascites. Musculoskeletal: Mild degenerative changes of the lumbar spine. IMPRESSION: No evidence of bowel obstruction or appendicitis. No colonic wall thickening or inflammatory changes. No CT findings to account for the patient's abdominal pain. Electronically Signed   By: Charline Bills M.D.   On: 01/02/2018 19:55    Procedures Procedures (including critical care time)  Medications Ordered in ED Medications  iohexol (OMNIPAQUE) 300 MG/ML solution 100 mL (100 mLs Intravenous Contrast Given 01/02/18 1942)  oxyCODONE-acetaminophen (PERCOCET/ROXICET) 5-325 MG per tablet 1 tablet (1 tablet Oral Given 01/02/18 2058)    ondansetron (ZOFRAN-ODT) disintegrating tablet 4 mg (4 mg Oral Given 01/02/18 2058)     Initial Impression / Assessment and Plan / ED Course  I have reviewed the triage vital signs and the nursing notes.  Pertinent labs & imaging results that were available during my care of the patient were reviewed by me and considered in my medical decision making (see chart for details).     Labs: UA, lipase, CMP, CBC   Imaging: CT abd /pelvis with contrast   Consults:  Therapeutics: Percocet, Zofran  Discharge Meds: Zofran, Percocet  Assessment/Plan: 52 year old male presents today with complaints of abdominal pain.  He is very reassuring work-up with no elevation white count reassuring CT.  Patient does have a history of intussusception, low suspicion for this today.  She will be given short course of pain medication, encouraged follow-up with gastroneurology if symptoms persist return immediately if they worsen.  He verbalized understanding and agreement to today's plan had no further questions or concerns.    Final Clinical Impressions(s) / ED Diagnoses   Final diagnoses:  Periumbilical abdominal pain    ED Discharge Orders        Ordered  oxyCODONE-acetaminophen (PERCOCET/ROXICET) 5-325 MG tablet  Every 6 hours PRN     01/02/18 2118    ondansetron (ZOFRAN) 4 MG tablet  Every 6 hours     01/02/18 2118       Eyvonne Mechanic, PA-C 01/03/18 2136    Arby Barrette, MD 01/08/18 [phone]

## 2018-01-02 NOTE — ED Triage Notes (Signed)
Pt states he has been having abd pain for several days. Pt states he vomited a small amount of blood this morning. Only one episode.

## 2018-01-02 NOTE — Discharge Instructions (Addendum)
Please read attached information. If you experience any new or worsening signs or symptoms please return to the emergency room for evaluation. Please follow-up with your primary care provider or specialist as discussed. Please use medication prescribed only as directed and discontinue taking if you have any concerning signs or symptoms.   °

## 2018-05-13 ENCOUNTER — Emergency Department (HOSPITAL_COMMUNITY)
Admission: EM | Admit: 2018-05-13 | Discharge: 2018-05-13 | Disposition: A | Discharge status: Home / Self Care | Payer: Self-pay | Attending: Emergency Medicine | Admitting: Emergency Medicine

## 2018-05-13 ENCOUNTER — Emergency Department (HOSPITAL_COMMUNITY): Payer: Self-pay

## 2018-05-13 ENCOUNTER — Encounter (HOSPITAL_COMMUNITY): Payer: Self-pay | Admitting: Emergency Medicine

## 2018-05-13 ENCOUNTER — Other Ambulatory Visit: Payer: Self-pay

## 2018-05-13 DIAGNOSIS — K610 Anal abscess: Secondary | ICD-10-CM

## 2018-05-13 DIAGNOSIS — F1290 Cannabis use, unspecified, uncomplicated: Secondary | ICD-10-CM | POA: Insufficient documentation

## 2018-05-13 DIAGNOSIS — F17210 Nicotine dependence, cigarettes, uncomplicated: Secondary | ICD-10-CM | POA: Insufficient documentation

## 2018-05-13 DIAGNOSIS — K611 Rectal abscess: Secondary | ICD-10-CM | POA: Insufficient documentation

## 2018-05-13 LAB — CBC WITH DIFFERENTIAL/PLATELET
Abs Immature Granulocytes: 0.04 10*3/uL (ref 0.00–0.07)
Basophils Absolute: 0 10*3/uL (ref 0.0–0.1)
Basophils Relative: 0 %
Eosinophils Absolute: 0.3 10*3/uL (ref 0.0–0.5)
Eosinophils Relative: 2 %
HCT: 46.4 % (ref 39.0–52.0)
Hemoglobin: 15 g/dL (ref 13.0–17.0)
Immature Granulocytes: 0 %
Lymphocytes Relative: 18 %
Lymphs Abs: 2.2 10*3/uL (ref 0.7–4.0)
MCH: 30.9 pg (ref 26.0–34.0)
MCHC: 32.3 g/dL (ref 30.0–36.0)
MCV: 95.5 fL (ref 80.0–100.0)
Monocytes Absolute: 0.9 10*3/uL (ref 0.1–1.0)
Monocytes Relative: 8 %
Neutro Abs: 8.6 10*3/uL — ABNORMAL HIGH (ref 1.7–7.7)
Neutrophils Relative %: 72 %
Platelets: 235 10*3/uL (ref 150–400)
RBC: 4.86 MIL/uL (ref 4.22–5.81)
RDW: 11.9 % (ref 11.5–15.5)
WBC: 12.1 10*3/uL — ABNORMAL HIGH (ref 4.0–10.5)
nRBC: 0 % (ref 0.0–0.2)

## 2018-05-13 LAB — I-STAT CHEM 8, ED
BUN: 7 mg/dL (ref 6–20)
Calcium, Ion: 1.1 mmol/L — ABNORMAL LOW (ref 1.15–1.40)
Chloride: 107 mmol/L (ref 98–111)
Creatinine, Ser: 0.9 mg/dL (ref 0.61–1.24)
Glucose, Bld: 100 mg/dL — ABNORMAL HIGH (ref 70–99)
HCT: 47 % (ref 39.0–52.0)
Hemoglobin: 16 g/dL (ref 13.0–17.0)
Potassium: 3.6 mmol/L (ref 3.5–5.1)
Sodium: 140 mmol/L (ref 135–145)
TCO2: 25 mmol/L (ref 22–32)

## 2018-05-13 MED ORDER — LIDOCAINE-EPINEPHRINE (PF) 2 %-1:200000 IJ SOLN
10.0000 mL | Freq: Once | INTRAMUSCULAR | Status: AC
  Administered 2018-05-13: 10 mL via INTRADERMAL
  Filled 2018-05-13: qty 10

## 2018-05-13 MED ORDER — IOHEXOL 300 MG/ML  SOLN: 75.0000 mL | Freq: Once | INTRAMUSCULAR | Status: DC | PRN

## 2018-05-13 MED ORDER — SODIUM CHLORIDE 0.9 % IV BOLUS: 1000.0000 mL | Freq: Once | INTRAVENOUS | Status: DC

## 2018-05-13 MED ORDER — IOHEXOL 300 MG/ML  SOLN
100.0000 mL | Freq: Once | INTRAMUSCULAR | Status: AC
Start: 2018-05-13 — End: 2018-05-13
  Administered 2018-05-13: 100 mL via INTRAVENOUS

## 2018-05-13 MED ORDER — IBUPROFEN 600 MG PO TABS: 600.0000 mg | ORAL_TABLET | Freq: Four times a day (QID) | ORAL | 0 refills | Status: DC | PRN

## 2018-05-13 NOTE — Discharge Instructions (Addendum)
Keep wound clean and dry. Apply warm compresses throughout the day. You can take a sitz bath as well.  Talk to the pharmacist about how to take a sitz bath.  Alternate 600 mg of ibuprofen and [phone] mg of Tylenol every 3 hours as needed for pain. Do not exceed 4000 mg of Tylenol daily.  Followup with [facility] in 2 days for wound recheck if your symptoms are not improving.  Return to emergency department for emergent changing or worsening symptoms such as fevers, vomiting, abdominal pain, pain with bowel movements per

## 2018-05-13 NOTE — ED Notes (Signed)
Patient transported to CT 

## 2018-05-13 NOTE — ED Provider Notes (Signed)
[facility] EMERGENCY DEPARTMENT Provider Note   CSN: [id number] Arrival date & time: 05/13/18  0102     History   Chief Complaint Chief Complaint  Patient presents with  . Abscess    HPI [name] is a 52 y.o. male presents today for evaluation of acute onset, progressively worsening area of swelling and tenderness to the left buttock for 3 days.  Reports pain is localized to the left buttock, does not radiate.  Denies pain with bowel movements, abdominal pain, nausea, vomiting, testicular pain or swelling, scrotal swelling, or urinary symptoms.  He has been applying a topical balm and onion without relief.  Ports he has experienced this in the past and has required I&D.  The history is provided by the patient.    History reviewed. No pertinent past medical history.  Patient Active Problem List   Diagnosis Date Noted  . Intussusception of jejunum (HCC) 09/12/2017  . Tailor's bunion of both feet 01/06/2017  . Onychomycosis 01/06/2017  . Foot callus 01/05/2017  . Foreign body in foot 01/05/2017    Past Surgical History:  Procedure Laterality Date  . EXTERNAL EAR SURGERY          Home Medications    Prior to Admission medications   Medication Sig Start Date End Date Taking? Authorizing Provider  acetaminophen (TYLENOL) 325 MG tablet Take 2 tablets (650 mg total) by mouth every 6 (six) hours as needed for mild pain (or temp > 100). 09/13/17   Adam Phenix, PA-C  ibuprofen (ADVIL,MOTRIN) 600 MG tablet Take 1 tablet (600 mg total) by mouth every 6 (six) hours as needed. 05/13/18   Sharad Vaneaton A, PA-C  ondansetron (ZOFRAN) 4 MG tablet Take 1 tablet (4 mg total) by mouth every 6 (six) hours. 01/02/18   Hedges, Tinnie Gens, PA-C  oxyCODONE-acetaminophen (PERCOCET/ROXICET) 5-325 MG tablet Take 1 tablet by mouth every 6 (six) hours as needed for severe pain. 01/02/18   Eyvonne Mechanic, PA-C    Family History No family history on file.  Social  History Social History   Tobacco Use  . Smoking status: Current Every Day Smoker    Packs/day: 0.50    Types: Cigarettes  . Smokeless tobacco: Never Used  Substance Use Topics  . Alcohol use: No  . Drug use: Yes    Types: Marijuana     Allergies   Patient has no known allergies.   Review of Systems Review of Systems  Gastrointestinal: Negative for abdominal pain, constipation, diarrhea, nausea and vomiting.  Skin: Positive for color change.     Physical Exam Updated Vital Signs BP 110/90 (BP Location: Right Arm)   Pulse 99   Temp 98.3 F (36.8 C) (Oral)   Resp 20   Ht 5\' 11"  (1.803 m)   Wt 74.8 kg   SpO2 100%   BMI 23.01 kg/m   Physical Exam  Constitutional: He appears well-developed and well-nourished. No distress.  HENT:  Head: Normocephalic and atraumatic.  Eyes: Conjunctivae are normal. Right eye exhibits no discharge. Left eye exhibits no discharge.  Neck: No JVD present. No tracheal deviation present.  Cardiovascular: Normal rate.  Pulmonary/Chest: Effort normal.  Abdominal: He exhibits no distension.  Genitourinary:     Genitourinary Comments: Examination performed in the presence of chaperone.  There is a 4 cm area of fluctuance and tenderness in the perianal region on the left. No drainage.   Musculoskeletal: He exhibits no edema.  Neurological: He is alert.  Skin: No  erythema.  Psychiatric: He has a normal mood and affect. His behavior is normal.  Nursing note and vitals reviewed.    ED Treatments / Results  Labs (all labs ordered are listed, but only abnormal results are displayed) Labs Reviewed  CBC WITH DIFFERENTIAL/PLATELET - Abnormal; Notable for the following components:      Result Value   WBC 12.1 (*)    Neutro Abs 8.6 (*)    All other components within normal limits  I-STAT CHEM 8, ED - Abnormal; Notable for the following components:   Glucose, Bld 100 (*)    Calcium, Ion 1.10 (*)    All other components within normal limits     EKG None  Radiology Ct Pelvis W Contrast  Result Date: 05/13/2018 CLINICAL DATA:  Anorectal pain and abscess. EXAM: CT PELVIS WITH CONTRAST TECHNIQUE: Multidetector CT imaging of the pelvis was performed using the standard protocol following the bolus administration of intravenous contrast. CONTRAST:  OMNIPAQUE IOHEXOL 300 MG/ML  SOLN COMPARISON:  01/02/2018 FINDINGS: Urinary Tract: Unremarkable urinary bladder. Bowel: Rim enhancing fluid collection is seen in the left perianal soft tissues which measures 3.0 x 1.7 cm, and is consistent with a perianal abscess. This is new since previous study. No other abscess identified. Vascular/Lymphatic: No pathologically enlarged lymph nodes or other significant abnormality. Reproductive:  Unremarkable prostate and seminal vesicles. Other: None. Musculoskeletal: No significant abnormality identified. IMPRESSION: 3.0 cm left-sided perianal abscess. Electronically Signed   By: Myles Rosenthal M.D.   On: 05/13/2018 09:46    Procedures .Marland KitchenIncision and Drainage Date/Time: 05/13/2018 11:27 AM Performed by: Jeanie Sewer, PA-C Authorized by: Jeanie Sewer, PA-C   Consent:    Consent obtained:  Verbal   Consent given by:  Patient   Risks discussed:  Bleeding, incomplete drainage, pain and damage to other organs   Alternatives discussed:  No treatment Universal protocol:    Procedure explained and questions answered to patient or proxy's satisfaction: yes     Relevant documents present and verified: yes     Test results available and properly labeled: yes     Imaging studies available: yes     Required blood products, implants, devices, and special equipment available: yes     Site/side marked: yes     Immediately prior to procedure a time out was called: yes     Patient identity confirmed:  Verbally with patient Location:    Type:  Abscess   Size:  3cm   Location:  Anogenital   Anogenital location:  Perianal Pre-procedure details:    Skin  preparation:  Betadine Anesthesia (see MAR for exact dosages):    Anesthesia method:  Local infiltration   Local anesthetic:  Lidocaine 2% WITH epi Procedure type:    Complexity:  Complex Procedure details:    Needle aspiration: no     Incision types:  Single straight   Incision depth:  Subcutaneous   Scalpel blade:  11   Wound management:  Probed and deloculated, irrigated with saline and extensive cleaning   Drainage:  Purulent   Drainage amount:  Copious   Wound treatment:  Wound left open   Packing materials:  None Post-procedure details:    Patient tolerance of procedure:  Tolerated well, no immediate complications   (including critical care time)  Medications Ordered in ED Medications  iohexol (OMNIPAQUE) 300 MG/ML solution 75 mL (has no administration in time range)  iohexol (OMNIPAQUE) 300 MG/ML solution 100 mL (100 mLs Intravenous Contrast Given 05/13/18  0800)  lidocaine-EPINEPHrine (XYLOCAINE W/EPI) 2 %-1:200000 (PF) injection 10 mL (10 mLs Intradermal Given 05/13/18 1114)     Initial Impression / Assessment and Plan / ED Course  I have reviewed the triage vital signs and the nursing notes.  Pertinent labs & imaging results that were available during my care of the patient were reviewed by me and considered in my medical decision making (see chart for details).    Patient presenting for evaluation of left perianal abscess.  He is afebrile, vital signs are stable.  He is nontoxic in appearance.  Given the proximity of the abscess, we will obtain lab work and CT of the pelvis to determine the extent of the abscess and rule out fistula.  Labs reviewed by me showed nonspecific leukocytosis, no metabolic derangements or renal insufficiency.  CT of the pelvis shows a 3 cm left-sided perianal abscess with no associated complication.  No evidence of Fournier's gangrene.  Patient consented to I&D in the ED which he tolerated without difficulty.  Copious purulent drainage expressed.   Abscess was not large enough to warrant packing or drain,  wound recheck in 2 days. Encouraged home warm soaks/sitz bath and flushing.  Mild signs of cellulitis is surrounding skin.  No antibiotic therapy is indicated.  Recommend follow-up with general surgery if symptoms persist as he has been seen multiple times for recurrent abscesses per discussed indications for return to the ED sooner. Pt verbalized understanding of and agreement with plan and is safe for discharge home at this time.    Final Clinical Impressions(s) / ED Diagnoses   Final diagnoses:  Perianal abscess    ED Discharge Orders         Ordered    ibuprofen (ADVIL,MOTRIN) 600 MG tablet  Every 6 hours PRN     05/13/18 1125           Jeanie Sewer, PA-C 05/13/18 1135    [name], MD 05/15/18 1818

## 2018-05-13 NOTE — ED Triage Notes (Signed)
Pt presents with abscess to buttock x 3 days.

## 2019-08-09 ENCOUNTER — Inpatient Hospital Stay (HOSPITAL_COMMUNITY)
Admission: EM | Admit: 2019-08-09 | Discharge: 2019-08-14 | DRG: 065 | Disposition: A | Discharge status: Home / Self Care | Payer: Managed Care, Other (non HMO) | Attending: Internal Medicine | Admitting: Internal Medicine

## 2019-08-09 ENCOUNTER — Emergency Department (HOSPITAL_COMMUNITY): Payer: Managed Care, Other (non HMO)

## 2019-08-09 ENCOUNTER — Other Ambulatory Visit: Payer: Self-pay

## 2019-08-09 ENCOUNTER — Encounter (HOSPITAL_COMMUNITY): Payer: Self-pay | Admitting: Emergency Medicine

## 2019-08-09 DIAGNOSIS — G8191 Hemiplegia, unspecified affecting right dominant side: Secondary | ICD-10-CM | POA: Diagnosis present

## 2019-08-09 DIAGNOSIS — M7989 Other specified soft tissue disorders: Secondary | ICD-10-CM | POA: Diagnosis present

## 2019-08-09 DIAGNOSIS — Q211 Atrial septal defect: Secondary | ICD-10-CM | POA: Diagnosis not present

## 2019-08-09 DIAGNOSIS — Z20822 Contact with and (suspected) exposure to covid-19: Secondary | ICD-10-CM | POA: Diagnosis present

## 2019-08-09 DIAGNOSIS — R4701 Aphasia: Secondary | ICD-10-CM | POA: Diagnosis present

## 2019-08-09 DIAGNOSIS — Z823 Family history of stroke: Secondary | ICD-10-CM | POA: Diagnosis not present

## 2019-08-09 DIAGNOSIS — Z79899 Other long term (current) drug therapy: Secondary | ICD-10-CM

## 2019-08-09 DIAGNOSIS — F1290 Cannabis use, unspecified, uncomplicated: Secondary | ICD-10-CM | POA: Diagnosis present

## 2019-08-09 DIAGNOSIS — I639 Cerebral infarction, unspecified: Secondary | ICD-10-CM | POA: Diagnosis not present

## 2019-08-09 DIAGNOSIS — I340 Nonrheumatic mitral (valve) insufficiency: Secondary | ICD-10-CM | POA: Diagnosis present

## 2019-08-09 DIAGNOSIS — G9389 Other specified disorders of brain: Secondary | ICD-10-CM | POA: Diagnosis not present

## 2019-08-09 DIAGNOSIS — I63412 Cerebral infarction due to embolism of left middle cerebral artery: Principal | ICD-10-CM | POA: Diagnosis present

## 2019-08-09 DIAGNOSIS — I428 Other cardiomyopathies: Secondary | ICD-10-CM | POA: Diagnosis present

## 2019-08-09 DIAGNOSIS — I472 Ventricular tachycardia: Secondary | ICD-10-CM | POA: Diagnosis not present

## 2019-08-09 DIAGNOSIS — F17210 Nicotine dependence, cigarettes, uncomplicated: Secondary | ICD-10-CM | POA: Diagnosis present

## 2019-08-09 DIAGNOSIS — R471 Dysarthria and anarthria: Secondary | ICD-10-CM | POA: Diagnosis present

## 2019-08-09 DIAGNOSIS — R29703 NIHSS score 3: Secondary | ICD-10-CM | POA: Diagnosis present

## 2019-08-09 DIAGNOSIS — I110 Hypertensive heart disease with heart failure: Secondary | ICD-10-CM | POA: Diagnosis present

## 2019-08-09 DIAGNOSIS — Z8249 Family history of ischemic heart disease and other diseases of the circulatory system: Secondary | ICD-10-CM

## 2019-08-09 DIAGNOSIS — R29810 Facial weakness: Secondary | ICD-10-CM | POA: Diagnosis present

## 2019-08-09 DIAGNOSIS — Z716 Tobacco abuse counseling: Secondary | ICD-10-CM | POA: Diagnosis not present

## 2019-08-09 DIAGNOSIS — I429 Cardiomyopathy, unspecified: Secondary | ICD-10-CM | POA: Diagnosis not present

## 2019-08-09 DIAGNOSIS — I420 Dilated cardiomyopathy: Secondary | ICD-10-CM | POA: Diagnosis not present

## 2019-08-09 DIAGNOSIS — I5022 Chronic systolic (congestive) heart failure: Secondary | ICD-10-CM | POA: Diagnosis present

## 2019-08-09 DIAGNOSIS — I672 Cerebral atherosclerosis: Secondary | ICD-10-CM | POA: Diagnosis present

## 2019-08-09 DIAGNOSIS — Z8673 Personal history of transient ischemic attack (TIA), and cerebral infarction without residual deficits: Secondary | ICD-10-CM | POA: Diagnosis not present

## 2019-08-09 DIAGNOSIS — I671 Cerebral aneurysm, nonruptured: Secondary | ICD-10-CM | POA: Diagnosis present

## 2019-08-09 DIAGNOSIS — I5021 Acute systolic (congestive) heart failure: Secondary | ICD-10-CM | POA: Diagnosis not present

## 2019-08-09 DIAGNOSIS — I252 Old myocardial infarction: Secondary | ICD-10-CM

## 2019-08-09 DIAGNOSIS — Z720 Tobacco use: Secondary | ICD-10-CM | POA: Diagnosis not present

## 2019-08-09 DIAGNOSIS — E785 Hyperlipidemia, unspecified: Secondary | ICD-10-CM | POA: Diagnosis present

## 2019-08-09 HISTORY — DX: Cerebral aneurysm, nonruptured: I67.1

## 2019-08-09 HISTORY — DX: Localized swelling, mass and lump, head: R22.0

## 2019-08-09 HISTORY — DX: Nonrheumatic mitral (valve) insufficiency: I34.0

## 2019-08-09 HISTORY — DX: Cardiomyopathy, unspecified: I42.9

## 2019-08-09 HISTORY — DX: Cerebral infarction due to unspecified occlusion or stenosis of left middle cerebral artery: I63.512

## 2019-08-09 HISTORY — DX: Tobacco use: Z72.0

## 2019-08-09 LAB — HIV ANTIBODY (ROUTINE TESTING W REFLEX): HIV Screen 4th Generation wRfx: NONREACTIVE

## 2019-08-09 LAB — DIFFERENTIAL
Abs Immature Granulocytes: 0.02 10*3/uL (ref 0.00–0.07)
Basophils Absolute: 0 10*3/uL (ref 0.0–0.1)
Basophils Relative: 0 %
Eosinophils Absolute: 0.2 10*3/uL (ref 0.0–0.5)
Eosinophils Relative: 2 %
Immature Granulocytes: 0 %
Lymphocytes Relative: 28 %
Lymphs Abs: 2 10*3/uL (ref 0.7–4.0)
Monocytes Absolute: 0.6 10*3/uL (ref 0.1–1.0)
Monocytes Relative: 9 %
Neutro Abs: 4.3 10*3/uL (ref 1.7–7.7)
Neutrophils Relative %: 61 %

## 2019-08-09 LAB — COMPREHENSIVE METABOLIC PANEL
ALT: 13 U/L (ref 0–44)
AST: 23 U/L (ref 15–41)
Albumin: 4 g/dL (ref 3.5–5.0)
Alkaline Phosphatase: 84 U/L (ref 38–126)
Anion gap: 9 (ref 5–15)
BUN: 10 mg/dL (ref 6–20)
CO2: 26 mmol/L (ref 22–32)
Calcium: 9 mg/dL (ref 8.9–10.3)
Chloride: 104 mmol/L (ref 98–111)
Creatinine, Ser: 1.19 mg/dL (ref 0.61–1.24)
GFR calc Af Amer: >60 mL/min (ref >60)
GFR calc non Af Amer: >60 mL/min (ref >60)
Glucose, Bld: 122 mg/dL — ABNORMAL HIGH (ref 70–99)
Potassium: 3.6 mmol/L (ref 3.5–5.1)
Sodium: 139 mmol/L (ref 135–145)
Total Bilirubin: 0.9 mg/dL (ref 0.3–1.2)
Total Protein: 7.3 g/dL (ref 6.5–8.1)

## 2019-08-09 LAB — URINALYSIS, ROUTINE W REFLEX MICROSCOPIC
Bacteria, UA: NONE SEEN
Bilirubin Urine: NEGATIVE
Glucose, UA: NEGATIVE mg/dL
Hgb urine dipstick: NEGATIVE
Ketones, ur: NEGATIVE mg/dL
Nitrite: NEGATIVE
Protein, ur: NEGATIVE mg/dL
Specific Gravity, Urine: 1.034 — ABNORMAL HIGH (ref 1.005–1.030)
pH: 6 (ref 5.0–8.0)

## 2019-08-09 LAB — CBC
HCT: 42.7 % (ref 39.0–52.0)
Hemoglobin: 14.6 g/dL (ref 13.0–17.0)
MCH: 32.2 pg (ref 26.0–34.0)
MCHC: 34.2 g/dL (ref 30.0–36.0)
MCV: 94.1 fL (ref 80.0–100.0)
Platelets: 213 10*3/uL (ref 150–400)
RBC: 4.54 MIL/uL (ref 4.22–5.81)
RDW: 12.2 % (ref 11.5–15.5)
WBC: 7.1 10*3/uL (ref 4.0–10.5)
nRBC: 0 % (ref 0.0–0.2)

## 2019-08-09 LAB — RAPID URINE DRUG SCREEN, HOSP PERFORMED
Amphetamines: NOT DETECTED
Barbiturates: NOT DETECTED
Benzodiazepines: NOT DETECTED
Cocaine: NOT DETECTED
Opiates: NOT DETECTED
Tetrahydrocannabinol: POSITIVE — AB

## 2019-08-09 LAB — I-STAT CHEM 8, ED
BUN: 11 mg/dL (ref 6–20)
Calcium, Ion: 1.09 mmol/L — ABNORMAL LOW (ref 1.15–1.40)
Chloride: 103 mmol/L (ref 98–111)
Creatinine, Ser: 1.1 mg/dL (ref 0.61–1.24)
Glucose, Bld: 118 mg/dL — ABNORMAL HIGH (ref 70–99)
HCT: 43 % (ref 39.0–52.0)
Hemoglobin: 14.6 g/dL (ref 13.0–17.0)
Potassium: 3.5 mmol/L (ref 3.5–5.1)
Sodium: 141 mmol/L (ref 135–145)
TCO2: 27 mmol/L (ref 22–32)

## 2019-08-09 LAB — APTT: aPTT: 24 seconds (ref 24–36)

## 2019-08-09 LAB — PROTIME-INR
INR: 1.1 (ref 0.8–1.2)
Prothrombin Time: 14.1 seconds (ref 11.4–15.2)

## 2019-08-09 LAB — ETHANOL: Alcohol, Ethyl (B): <10 mg/dL (ref <10)

## 2019-08-09 MED ORDER — CLOPIDOGREL BISULFATE 75 MG PO TABS
75.0000 mg | ORAL_TABLET | Freq: Every day | ORAL | Status: DC
  Administered 2019-08-09 – 2019-08-12 (×4): 75 mg via ORAL
  Filled 2019-08-09 (×4): qty 1

## 2019-08-09 MED ORDER — ATORVASTATIN CALCIUM 40 MG PO TABS
40.0000 mg | ORAL_TABLET | Freq: Every day | ORAL | Status: DC
  Administered 2019-08-10: 40 mg via ORAL
  Filled 2019-08-09: qty 1

## 2019-08-09 MED ORDER — ACETAMINOPHEN 650 MG RE SUPP: 650.0000 mg | RECTAL | Status: DC | PRN

## 2019-08-09 MED ORDER — ENOXAPARIN SODIUM 40 MG/0.4ML ~~LOC~~ SOLN
40.0000 mg | SUBCUTANEOUS | Status: DC
  Administered 2019-08-09 – 2019-08-11 (×3): 40 mg via SUBCUTANEOUS
  Filled 2019-08-09 (×3): qty 0.4

## 2019-08-09 MED ORDER — SENNOSIDES-DOCUSATE SODIUM 8.6-50 MG PO TABS: 1.0000 | ORAL_TABLET | Freq: Every evening | ORAL | Status: DC | PRN

## 2019-08-09 MED ORDER — STROKE: EARLY STAGES OF RECOVERY BOOK
Freq: Once | Status: AC
  Filled 2019-08-09: qty 1

## 2019-08-09 MED ORDER — ACETAMINOPHEN 325 MG PO TABS: 650.0000 mg | ORAL_TABLET | ORAL | Status: DC | PRN

## 2019-08-09 MED ORDER — ASPIRIN EC 81 MG PO TBEC
81.0000 mg | DELAYED_RELEASE_TABLET | Freq: Every day | ORAL | Status: DC
  Administered 2019-08-09 – 2019-08-12 (×4): 81 mg via ORAL
  Filled 2019-08-09 (×4): qty 1

## 2019-08-09 MED ORDER — ACETAMINOPHEN 160 MG/5ML PO SOLN: 650.0000 mg | ORAL | Status: DC | PRN

## 2019-08-09 MED ORDER — IOHEXOL 350 MG/ML SOLN
80.0000 mL | Freq: Once | INTRAVENOUS | Status: AC | PRN
  Administered 2019-08-09: 80 mL via INTRAVENOUS

## 2019-08-09 NOTE — Consult Note (Addendum)
NEURO HOSPITALIST  CONSULT   Requesting Physician: Dr. Rhunette Croft    Chief Complaint:  Slurred speech. Right facial droop   History obtained from:  Patient   / EMS  HPI:                                                                                                                                         David [name] is an 54 y.o. male  With no known PMH presented to [facility] ED as a code stroke for slurred speech and  Facial droop.    per EMS patient was last seen normal last night when he went to bed. He woke up and went to work. Came home and wife noted facial droop and slurred speech. EMS was called. Per patient he woke up this morning feeling completely normal, but he can't verbalize when he felt different or what happened. Denies numbness, tingling visual changes ETOH. Endorses smoking 2 packs of cigarettes per day, and  Smoking marijuana. Denies HTN, DM or any previous strokes, blood thinners.  ED course:  CTH: negative for hemorrhage; ASPECTS 9, acute/subacute infarction in the mid left frontal lobe and left frontal operculum.  BP: 180/110 BG: 149 CTA: no LVO; left  ICA 32mm annular    Date last known well:08/08/2019 Time last known well: 2100 tPA Given: {no; outside of TPA window Modified Rankin: Rankin Score=0 NIHSS: 3    History reviewed. No pertinent past medical history.  Past Surgical History:  Procedure Laterality Date  . EXTERNAL EAR SURGERY      History reviewed. No pertinent family history.      Social History:  reports that he has been smoking cigarettes. He has been smoking about 0.50 packs per day. He has never used smokeless tobacco. He reports current drug use. Drug: Marijuana. He reports that he does not drink alcohol.  Allergies: No Known Allergies  Medications:                                                                                                                           No current  facility-administered medications for this encounter.   Current Outpatient Medications  Medication Sig Dispense Refill  . acetaminophen (TYLENOL) 325 MG tablet Take 2 tablets (650 mg total) by mouth every 6 (six) hours as needed for mild pain (or temp > 100).    [name] ibuprofen (ADVIL,MOTRIN) 600 MG tablet Take 1 tablet (600 mg total) by mouth every 6 (six) hours as needed. 30 tablet 0  . ondansetron (ZOFRAN) 4 MG tablet Take 1 tablet (4 mg total) by mouth every 6 (six) hours. 12 tablet 0  . oxyCODONE-acetaminophen (PERCOCET/ROXICET) 5-325 MG tablet Take 1 tablet by mouth every 6 (six) hours as needed for severe pain. 3 tablet 0     ROS:                                                                                                                                       ROS was performed and is negative except as noted in HPI    General Examination:                                                                                                      There were no vitals taken for this visit.  Physical Exam  Constitutional: Appears well-developed and well-nourished.  Psych: Affect appropriate to situation Eyes: Normal external eye and conjunctiva. HENT: Normocephalic, no lesions, without obvious abnormality. Patch over left ear  Musculoskeletal-no joint tenderness, deformity or swelling Cardiovascular: Normal rate and regular rhythm.  Respiratory: Effort normal, non-labored breathing saturations WNL GI: Soft.  No distension. There is no tenderness.  Skin: WDI  Neurological Examination Mental Status: Alert, oriented name ( goes by middle name Awanda Mink), age/ month, thought content appropriate.   Some aphasia noted ( word finding difficulty).   Able to follow commands without difficulty. Cranial Nerves: TD:VVOHYW fields grossly normal, blinks to threat bilaterally, 50% of the time would state incorrect number of fingers I was holding up. III,IV, VI: ptosis not present, extra-ocular motions  intact bilaterally, pupils equal, round, reactive to light and accommodation V,VII: smile symmetric, facial light touch sensation normal bilaterally VIII: hearing normal bilaterally IX,X: uvula rises midline XI: bilateral shoulder shrug XII: midline tongue extension Motor: Right : Upper extremity   5/5  Left:     Upper extremity   5/5  Lower extremity   5/5   Lower extremity   5/5 Tone and bulk:normal tone throughout; no atrophy noted Sensory:  light touch intact throughout, bilaterally Deep Tendon Reflexes: 2+ and symmetric  biceps and patella Cerebellar: No ataxia noted Gait:deferred   Lab Results: Basic Metabolic Panel: Recent Labs  Lab 08/09/19 1738  NA 141  K 3.5  CL 103  GLUCOSE 118*  BUN 11  CREATININE 1.10    CBC: Recent Labs  Lab 08/09/19 1738  HGB 14.6  HCT 43.0    Imaging: CT Code Stroke CTA Neck W/WO contrast  Result Date: 08/09/2019 CLINICAL DATA:  Stroke code. Focal neuro deficit, greater than 6 hours, stroke suspected. EXAM: CT ANGIOGRAPHY HEAD AND NECK CT PERFUSION BRAIN TECHNIQUE: Multidetector CT imaging of the head and neck was performed using the standard protocol during bolus administration of intravenous contrast. Multiplanar CT image reconstructions and MIPs were obtained to evaluate the vascular anatomy. Carotid stenosis measurements (when applicable) are obtained utilizing NASCET criteria, using the distal internal carotid diameter as the denominator. Multiphase CT imaging of the brain was performed following IV bolus contrast injection. Subsequent parametric perfusion maps were calculated using RAPID software. CONTRAST:  Administered contrast not known at this time COMPARISON:  Noncontrast head CT performed earlier the same day. FINDINGS: CTA NECK FINDINGS Aortic arch: Standard aortic branching. No significant innominate or proximal subclavian artery stenosis Right carotid system: CCA and ICA patent within the neck without stenosis. Mild mixed plaque  within the carotid bulb. Left carotid system: CCA and ICA patent within the neck without stenosis. Vertebral arteries: Codominant. Patent within the neck without stenosis Skeleton: No acute bony abnormality. Cervical spondylosis with multilevel posterior disc osteophytes, uncovertebral and facet hypertrophy. Other neck: No neck mass or cervical lymphadenopathy. Upper chest: No consolidation within the imaged lung apices. Bullous emphysema. Review of the MIP images confirms the above findings CTA HEAD FINDINGS Anterior circulation: The intracranial internal carotid arteries are patent without high-grade stenosis. 7 mm annular is arising from the cavernous left ICA (series 5, image 124). The M1 right middle cerebral artery is patent without significant stenosis. No right M2 proximal branch occlusion or high-grade proximal stenosis is identified. The M1 left middle cerebral artery is patent without significant stenosis. No definite M2 proximal branch occlusion is identified. There is a high-grade focal stenosis within a proximal inferior division left M2 branch (series 10, image 12). There is also a high-grade stenosis within a mid to distal superior division M2 branch (series 10, image 12). The anterior cerebral arteries are patent without high-grade proximal stenosis. Posterior circulation: The intracranial right vertebral artery is dominant and patent without significant stenosis. The intracranial left vertebral artery is developmentally diminutive and markedly irregular, but patent. The basilar artery is patent without significant stenosis. The bilateral posterior cerebral arteries are patent without significant proximal stenosis. Posterior communicating arteries are poorly delineated and may be hypoplastic or absent bilaterally. Venous sinuses: Within limitations of contrast timing, no convincing thrombus. Anatomic variants: As described Review of the MIP images confirms the above findings CT Brain Perfusion  Findings: ASPECTS: 9 CBF (<30%) Volume: 19mL Perfusion (Tmax>6.0s) volume: 30mL Mismatch Volume: 94mL IMPRESSION: CTA neck: 1. The bilateral common and internal carotid arteries are patent within the neck without significant stenosis. 2. The vertebral arteries are patent within the neck without stenosis. CTA head: 1. No intracranial large vessel occlusion. No definite proximal M2 left MCA branch occlusion is identified. 2. There is a high-grade stenosis within an inferior division proximal left MCA M2 branch. Additional high-grade stenosis within the mid to distal superior division left MCA M2 branch. 3. 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 CT perfusion head: The perfusion software  identifies no core infarct. The perfusion software identifies no critically hypoperfused parenchyma utilizing a Tmax>6 seconds threshold. However, there were clear changes of ischemic infarction within the mid left frontal lobe and left frontal operculum on noncontrast head CT performed immediately prior. Of note, there is a 9 mL region of hypoperfused parenchyma in this region when utilizing a Tmax > 4 seconds threshold. Electronically Signed   By: Jackey Loge DO   On: 08/09/2019 18:12   CT HEAD CODE STROKE WO CONTRAST  Addendum Date: 08/09/2019   ADDENDUM REPORT: 08/09/2019 17:57 ADDENDUM: These results were called by telephone at the time of interpretation on 08/09/2019 at 5:54 pm to provider Dr. Laurence Slate, who verbally acknowledged these results. Electronically Signed   By: Jackey Loge DO   On: 08/09/2019 17:57   Result Date: 08/09/2019 CLINICAL DATA:  Code stroke.  Aphasia EXAM: CT HEAD WITHOUT CONTRAST TECHNIQUE: Contiguous axial images were obtained from the base of the skull through the vertex without intravenous contrast. COMPARISON:  No pertinent prior studies available for comparison. FINDINGS: Brain: There is no evidence of acute intracranial hemorrhage. There is abnormal hypodensity consistent with acute/early  subacute infarction involving the mid left frontal lobe and left frontal operculum. There is no significant mass effect. No evidence of intracranial mass. No midline shift or extra-axial fluid collection. Vascular: No hyperdense vessel is identified. Skull: Normal. Negative for fracture or focal lesion. Sinuses/Orbits: Visualized orbits demonstrate no acute abnormality. Paranasal sinus disease. Most notably, there is near complete opacification left maxillary sinus. Right maxillary sinus mucous retention cyst. Scattered opacification of bilateral ethmoid air cells. No significant mastoid effusion. Other: There is a cutaneous/subcutaneous mass along the lateral aspect of the left temporal bone and involving the external ear which measures 7.0 x 2.3 cm in transaxial dimensions, likely reflecting malignancy (series 3, image 9) ASPECTS (Alberta Stroke Program Early CT Score) - Ganglionic level infarction (caudate, lentiform nuclei, internal capsule, insula, M1-M3 cortex): Seven - Supraganglionic infarction (M4-M6 cortex): 2 (point adducted for involvement of the left M5 territory) Total score (0-10 with 10 being normal):  9 IMPRESSION: Acute/early subacute ischemic infarction within the mid left frontal lobe and left frontal operculum. No evidence of hemorrhagic conversion. No significant mass effect. ASPECTS 9 Electronically Signed: By: Jackey Loge DO On: 08/09/2019 17:48       Valentina Lucks, MSN, NP-C [facility] [phone]  08/09/2019, 5:48 PM   Attending physician note to follow with Assessment and plan .   Assessment: 54 y.o. male with no significant PMH presented to [facility] ED as a code stroke for slurred speech and facial droop. LSN was 2100 last night. TPA not given d/t patient presenting outside of the window. Patient was not a candidate for thrombectomy given CTA did not show and LVO. Patient has an established infarction in the mid left frontal lobe and left frontal operculum. Needs  admission for Full stroke work-up.  Will consult neur IR non- emergently for left ICA 71mm  Stroke Risk Factors - smoking   Left frontal acute ischemic infarction Intracranial atherosclerotic disease Left ICA aneurysm  Etiology: Atheroembolic versus cardioembolic  Recommendations: -- BP goal : Permissive HTN upto 220/110 mmHg (for 24-48 post admission )  --MRI Brain  --Echocardiogram -- start ASA and Plavix -- High intensity Statin if LDL > 70  -- HgbA1c, fasting lipid panel -- PT consult, OT consult, Speech consult --Telemetry monitoring --Frequent neuro checks --Stroke swallow screen    --please page stroke NP  Or  PA  Or MD from 8am -4 pm  as this patient from this time will be  followed by the stroke.   You can look them up on [email or web address]  Password [email or web address]  NEUROHOSPITALIST ADDENDUM Performed a face to face diagnostic evaluation.   I have reviewed the contents of history and physical exam as documented by PA/ARNP/Resident and agree with above documentation.  I have discussed and formulated the above plan as documented. Edits to the note have been made as needed.  54 year old male presents with aphasia:  last known normal was yesterday.  Arrived as a code stroke as he was LVO positive-for aphasia.  On arrival, NIH stroke scale of 3 for mild aphasia, dysarthria and facial droop.  No visual or sensory neglect.  CT head showed acute left frontal infarct, CTA negative for LVO however did show focal stenosis in the left M2 as on the right ACA.    Start patient on dual antiplatelet for intracranial atherosclerotic disease as well as acute stroke with low NIH stroke scale. May possibly need TEE if TTE is negative.  Stroke team to follow  Georgiana Spinner Geoffrey Mankin MD [facility] [phone]   If 7pm to 7am, please call on call as listed on AMION.

## 2019-08-09 NOTE — ED Triage Notes (Signed)
Per [facility] pt LSW 2100 last night. Pt went to work, did not talk to anyone today. Pt has R facial droop and expressive aphasia.

## 2019-08-09 NOTE — ED Provider Notes (Signed)
[facility] EMERGENCY DEPARTMENT Provider Note   CSN: [id number] Arrival date & time: 08/09/19  1740     History Chief Complaint  Patient presents with  . Code Stroke    David [name] is a 54 y.o. male.  The history is provided by the patient, the EMS personnel and medical records.   The patient is a 54 year old male with a past medical history of jejunal intussusception who presents to the ED for code stroke.  Patient's last known normal was 2100 on 08/08/2019, today his wife noticed right facial droop and slurred speech after he came home from work, the patient is unable to express what he believes his symptoms may have started.  His symptoms are constant, symptoms are moderate, symptoms are unchanged, nothing makes them worse or better.  He denies any weakness in his arms or legs, any numbness, visual changes, no fever or chills, headaches, chest pain, shortness of breath.          History reviewed. No pertinent past medical history.  Patient Active Problem List   Diagnosis Date Noted  . Tobacco abuse 08/10/2019  . Acute ischemic stroke (HCC) 08/09/2019  . Intussusception of jejunum (HCC) 09/12/2017  . Tailor's bunion of both feet 01/06/2017  . Onychomycosis 01/06/2017  . Foot callus 01/05/2017  . Foreign body in foot 01/05/2017    Past Surgical History:  Procedure Laterality Date  . EXTERNAL EAR SURGERY         No family history on file.  Social History   Tobacco Use  . Smoking status: Current Every Day Smoker    Packs/day: 0.50    Types: Cigarettes  . Smokeless tobacco: Never Used  Substance Use Topics  . Alcohol use: No  . Drug use: Yes    Types: Marijuana    Home Medications Prior to Admission medications   Medication Sig Start Date End Date Taking? Authorizing Provider  acetaminophen (TYLENOL) 325 MG tablet Take 2 tablets (650 mg total) by mouth every 6 (six) hours as needed for mild pain (or temp > 100). 09/13/17   Adam Phenix, PA-C  ibuprofen (ADVIL,MOTRIN) 600 MG tablet Take 1 tablet (600 mg total) by mouth every 6 (six) hours as needed. 05/13/18   Fawze, Mina A, PA-C  ondansetron (ZOFRAN) 4 MG tablet Take 1 tablet (4 mg total) by mouth every 6 (six) hours. 01/02/18   Hedges, Tinnie Gens, PA-C  oxyCODONE-acetaminophen (PERCOCET/ROXICET) 5-325 MG tablet Take 1 tablet by mouth every 6 (six) hours as needed for severe pain. 01/02/18   Eyvonne Mechanic, PA-C    Allergies    Patient has no known allergies.  Review of Systems   Review of Systems  Constitutional: Negative for chills and fever.  Eyes: Negative for visual disturbance.  Cardiovascular: Negative for chest pain.  Neurological: Positive for facial asymmetry and speech difficulty. Negative for weakness and numbness.  All other systems reviewed and are negative.   Physical Exam Updated Vital Signs There were no vitals taken for this visit.  Physical Exam Vitals and nursing note reviewed.  Constitutional:      Appearance: He is well-developed. He is not toxic-appearing.  HENT:     Head: Normocephalic and atraumatic.     Mouth/Throat:     Mouth: Mucous membranes are moist.     Pharynx: Oropharynx is clear.  Eyes:     Extraocular Movements: Extraocular movements intact.     Conjunctiva/sclera: Conjunctivae normal.     Pupils: Pupils are equal,  round, and reactive to light.  Cardiovascular:     Rate and Rhythm: Normal rate and regular rhythm.     Pulses: Normal pulses.     Heart sounds: No murmur.  Pulmonary:     Effort: Pulmonary effort is normal. No respiratory distress.     Breath sounds: Normal breath sounds.  Abdominal:     Palpations: Abdomen is soft.     Tenderness: There is no abdominal tenderness.  Musculoskeletal:     Cervical back: Neck supple. No rigidity.     Right lower leg: No edema.     Left lower leg: No edema.  Skin:    General: Skin is warm and dry.  Neurological:     Mental Status: He is alert and oriented to  person, place, and time.     GCS: GCS eye subscore is 4. GCS verbal subscore is 5. GCS motor subscore is 6.     Cranial Nerves: Cranial nerve deficit (right facial droop, left tongue deviation) and facial asymmetry present.     Sensory: No sensory deficit.     Motor: No weakness or pronator drift.     Coordination: Finger-Nose-Finger Test normal.     Deep Tendon Reflexes: Reflexes normal.     ED Results / Procedures / Treatments   Labs (all labs ordered are listed, but only abnormal results are displayed) Labs Reviewed  COMPREHENSIVE METABOLIC PANEL - Abnormal; Notable for the following components:      Result Value   Glucose, Bld 122 (*)    All other components within normal limits  RAPID URINE DRUG SCREEN, HOSP PERFORMED - Abnormal; Notable for the following components:   Tetrahydrocannabinol POSITIVE (*)    All other components within normal limits  URINALYSIS, ROUTINE W REFLEX MICROSCOPIC - Abnormal; Notable for the following components:   Color, Urine STRAW (*)    Specific Gravity, Urine 1.034 (*)    Leukocytes,Ua TRACE (*)    All other components within normal limits  LIPID PANEL - Abnormal; Notable for the following components:   HDL 34 (*)    All other components within normal limits  I-STAT CHEM 8, ED - Abnormal; Notable for the following components:   Glucose, Bld 118 (*)    Calcium, Ion 1.09 (*)    All other components within normal limits  SARS CORONAVIRUS 2 (TAT 6-24 HRS)  CULTURE, BLOOD (ROUTINE X 2)  CULTURE, BLOOD (ROUTINE X 2)  ETHANOL  CBC  DIFFERENTIAL  APTT  PROTIME-INR  HIV ANTIBODY (ROUTINE TESTING W REFLEX)  HEMOGLOBIN A1C    EKG None  Radiology CT Code Stroke CTA Head W/WO contrast  Addendum Date: 08/09/2019   ADDENDUM REPORT: 08/09/2019 18:31 ADDENDUM: Please note there is also a moderate to severe stenosis within the A1 right anterior cerebral artery (series 8, image 21) (series 9, image 17). These results were called by telephone at the  time of interpretation on 08/09/2019 at 6:31 pm to provider Dr. Laurence Slate, who verbally acknowledged these results. Electronically Signed   By: Jackey Loge DO   On: 08/09/2019 18:31   Result Date: 08/09/2019 CLINICAL DATA:  Stroke code. EXAM: CT ANGIOGRAPHY HEAD TECHNIQUE: Multidetector CT imaging of the head was performed using the standard protocol during bolus administration of intravenous contrast. Multiplanar CT image reconstructions and MIPs were obtained to evaluate the vascular anatomy. CONTRAST:  31mL OMNIPAQUE IOHEXOL 350 MG/ML SOLN COMPARISON:  Noncontrast head CT performed earlier the same day. FINDINGS: Please refer to full dictation of the CTA  head/neck and CT perfusion under the CT angiogram neck examination. IMPRESSION: Please refer to full dictation of the CTA head/neck and CT perfusion under the CT angiogram neck examination. Electronically Signed: By: Kellie Simmering DO On: 08/09/2019 18:18   CT Code Stroke CTA Neck W/WO contrast  Addendum Date: 08/09/2019   ADDENDUM REPORT: 08/09/2019 22:14 ADDENDUM: Please note there is also a moderate to severe stenosis within the A1 right anterior cerebral artery (series 8, image 21) (series 9, image 17). These results were called by telephone at the time of interpretation on 08/09/2019 at 6:31 pm to provider Dr. Lorraine Lax, who verbally acknowledged these results. Electronically Signed   By: Kellie Simmering DO   On: 08/09/2019 22:14   Result Date: 08/09/2019 CLINICAL DATA:  Stroke code. Focal neuro deficit, greater than 6 hours, stroke suspected. EXAM: CT ANGIOGRAPHY HEAD AND NECK CT PERFUSION BRAIN TECHNIQUE: Multidetector CT imaging of the head and neck was performed using the standard protocol during bolus administration of intravenous contrast. Multiplanar CT image reconstructions and MIPs were obtained to evaluate the vascular anatomy. Carotid stenosis measurements (when applicable) are obtained utilizing NASCET criteria, using the distal internal carotid diameter as  the denominator. Multiphase CT imaging of the brain was performed following IV bolus contrast injection. Subsequent parametric perfusion maps were calculated using RAPID software. CONTRAST:  Administered contrast not known at this time COMPARISON:  Noncontrast head CT performed earlier the same day. FINDINGS: CTA NECK FINDINGS Aortic arch: Standard aortic branching. No significant innominate or proximal subclavian artery stenosis Right carotid system: CCA and ICA patent within the neck without stenosis. Mild mixed plaque within the carotid bulb. Left carotid system: CCA and ICA patent within the neck without stenosis. Vertebral arteries: Codominant. Patent within the neck without stenosis Skeleton: No acute bony abnormality. Cervical spondylosis with multilevel posterior disc osteophytes, uncovertebral and facet hypertrophy. Other neck: No neck mass or cervical lymphadenopathy. Upper chest: No consolidation within the imaged lung apices. Bullous emphysema. Review of the MIP images confirms the above findings CTA HEAD FINDINGS Anterior circulation: The intracranial internal carotid arteries are patent without high-grade stenosis. 7 mm annular is arising from the cavernous left ICA (series 5, image 124). The M1 right middle cerebral artery is patent without significant stenosis. No right M2 proximal branch occlusion or high-grade proximal stenosis is identified. The M1 left middle cerebral artery is patent without significant stenosis. No definite M2 proximal branch occlusion is identified. There is a high-grade focal stenosis within a proximal inferior division left M2 branch (series 10, image 12). There is also a high-grade stenosis within a mid to distal superior division M2 branch (series 10, image 12). The anterior cerebral arteries are patent without high-grade proximal stenosis. Posterior circulation: The intracranial right vertebral artery is dominant and patent without significant stenosis. The intracranial  left vertebral artery is developmentally diminutive and markedly irregular, but patent. The basilar artery is patent without significant stenosis. The bilateral posterior cerebral arteries are patent without significant proximal stenosis. Posterior communicating arteries are poorly delineated and may be hypoplastic or absent bilaterally. Venous sinuses: Within limitations of contrast timing, no convincing thrombus. Anatomic variants: As described Review of the MIP images confirms the above findings CT Brain Perfusion Findings: ASPECTS: 9 CBF (<30%) Volume: 54mL Perfusion (Tmax>6.0s) volume: 87mL Mismatch Volume: 44mL IMPRESSION: CTA neck: 1. The bilateral common and internal carotid arteries are patent within the neck without significant stenosis. 2. The vertebral arteries are patent within the neck without stenosis. CTA head: 1. No intracranial  large vessel occlusion. No definite proximal M2 left MCA branch occlusion is identified. 2. There is a high-grade stenosis within an inferior division proximal left MCA M2 branch. Additional high-grade stenosis within the mid to distal superior division left MCA M2 branch. 3. 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 CT perfusion head: The perfusion software identifies no core infarct. The perfusion software identifies no critically hypoperfused parenchyma utilizing a Tmax>6 seconds threshold. However, there were clear changes of ischemic infarction within the mid left frontal lobe and left frontal operculum on noncontrast head CT performed immediately prior. Of note, there is a 9 mL region of hypoperfused parenchyma in this region when utilizing a Tmax > 4 seconds threshold. Electronically Signed: By: Jackey Loge DO On: 08/09/2019 18:12   CT Code Stroke Cerebral Perfusion with contrast  Result Date: 08/09/2019 CLINICAL DATA:  Stroke code. EXAM: CT PERFUSION BRAIN TECHNIQUE: Multiphase CT imaging of the brain was performed following IV bolus contrast injection.  Subsequent parametric perfusion maps were calculated using RAPID software. CONTRAST:  83mL OMNIPAQUE IOHEXOL 350 MG/ML SOLN COMPARISON:  Noncontrast head CT performed earlier the same day. FINDINGS: Please refer to complete dictation for the CTA head/neck and CT perfusion under the CTA neck dictation. IMPRESSION: Please refer to complete dictation for the CTA head/neck and CT perfusion under the CTA neck dictation. Electronically Signed   By: Jackey Loge DO   On: 08/09/2019 18:19   CT HEAD CODE STROKE WO CONTRAST  Addendum Date: 08/09/2019   ADDENDUM REPORT: 08/09/2019 22:25 ADDENDUM: Findings omitted from the impression. 7.0 x 2.3 cm cutaneous/subcutaneous soft tissue mass along the lateral aspect of the left temporal bone, involving the left external ear, highly suspicious for malignancy. This finding was discussed with Dr. Laurence Slate at the time of the original dictation at 5:54 p.m. on 08/09/19. Electronically Signed   By: Jackey Loge DO   On: 08/09/2019 22:25   Addendum Date: 08/09/2019   ADDENDUM REPORT: 08/09/2019 17:57 ADDENDUM: These results were called by telephone at the time of interpretation on 08/09/2019 at 5:54 pm to provider Dr. Laurence Slate, who verbally acknowledged these results. Electronically Signed   By: Jackey Loge DO   On: 08/09/2019 17:57   Result Date: 08/09/2019 CLINICAL DATA:  Code stroke.  Aphasia EXAM: CT HEAD WITHOUT CONTRAST TECHNIQUE: Contiguous axial images were obtained from the base of the skull through the vertex without intravenous contrast. COMPARISON:  No pertinent prior studies available for comparison. FINDINGS: Brain: There is no evidence of acute intracranial hemorrhage. There is abnormal hypodensity consistent with acute/early subacute infarction involving the mid left frontal lobe and left frontal operculum. There is no significant mass effect. No evidence of intracranial mass. No midline shift or extra-axial fluid collection. Vascular: No hyperdense vessel is identified. Skull:  Normal. Negative for fracture or focal lesion. Sinuses/Orbits: Visualized orbits demonstrate no acute abnormality. Paranasal sinus disease. Most notably, there is near complete opacification left maxillary sinus. Right maxillary sinus mucous retention cyst. Scattered opacification of bilateral ethmoid air cells. No significant mastoid effusion. Other: There is a cutaneous/subcutaneous mass along the lateral aspect of the left temporal bone and involving the external ear which measures 7.0 x 2.3 cm in transaxial dimensions, likely reflecting malignancy (series 3, image 9) ASPECTS ([place]berta Stroke Program Early CT Score) - Ganglionic level infarction (caudate, lentiform nuclei, internal capsule, insula, M1-M3 cortex): Seven - Supraganglionic infarction (M4-M6 cortex): 2 (point adducted for involvement of the left M5 territory) Total score (0-10 with 10 being  normal):  9 IMPRESSION: Acute/early subacute ischemic infarction within the mid left frontal lobe and left frontal operculum. No evidence of hemorrhagic conversion. No significant mass effect. ASPECTS 9 Electronically Signed: By: Jackey Loge DO On: 08/09/2019 17:48   Procedures Procedures (including critical care time)  Medications Ordered in ED Medications - No data to display  ED Course  I have reviewed the triage vital signs and the nursing notes.  Pertinent labs & imaging results that were available during my care of the patient were reviewed by me and considered in my medical decision making (see chart for details).    MDM Rules/Calculators/A&P                      Here for code stroke, evaluated by neurology immediately upon arrival, airway intact.  Evidence of stroke on CT head, no large vessel occlusion.  Out of TPA window.  Neurology recommends admission for stroke work-up and further treatment.  Admitted to hospital medicine.  Final Clinical Impression(s) / ED Diagnoses Final diagnoses:  Stroke (cerebrum) [facility])    Rx / DC  Orders ED Discharge Orders    None       Huntington Leverich, [place], MD 08/10/19 1432    Derwood Kaplan, MD 08/10/19 1520

## 2019-08-09 NOTE — H&P (Signed)
History and Physical    David [name]:[id number] DOB: 1965-12-01 DOA: 08/09/2019  PCP: Patient, No Pcp Per  Patient coming from: Home  I have personally briefly reviewed patient's old medical records in [place]  Chief Complaint: Speech abnormality, facial droop  HPI: David [name] is a 54 y.o. male with medical history significant for tobacco use who presents to the ED for evaluation of speech abnormality and right facial droop.  History limited from patient due to expressive aphasia and supplemented by EDP, chart review, and significant other and sister by phone.  Patient was reportedly last seen normal around 2100 on 08/08/2019.  Sometime during work on 08/09/2019 he began to have expressive aphasia and was noticed to have right facial droop my significant other when he returned home.  He reports having some weakness in his right hand.  He denies any numbness, tingling, chest pain, dyspnea.  He denies any prior history of stroke.  He is not currently taking any medications.  He reports smoking a half pack per day.  He reports marijuana use.  He denies any alcohol, cocaine, or IV drug use.  He is unaware of any medical conditions in his family.  ED Course:  Initial vitals showed BP 147/82, pulse 68, RR 26, temp 98.3 Fahrenheit, SPO2 100% on room air.  Labs notable for WBC 7.1, hemoglobin 14.6, platelets 213,000, sodium 139, potassium 3.6, bicarb 26, BUN 10, creatinine 1.19, serum glucose 122, LFTs within normal limits, serum ethanol level undetectable.  CT head without contrast showed an acute/early subacute ischemic infarction within the mid left frontal lobe and left frontal operculum without evidence of hemorrhagic conversion or mass-effect.  CTA head/neck and cerebral perfusion studies were obtained which showed patent common and internal carotid arteries bilaterally within the neck.  No definite large vessel occlusion seen.  High-grade stenosis within the inferior division  proximal left MCA M2 branch and mid to distal superior division left MCA M2 branch are noted.  A 7 mm aneurysm arising from the cavernous left ICA also noted.  Neurology were consulted and recommended medical admission for further stroke work-up.  The hospitalist service was consulted to me for further evaluation and management.  Review of Systems: All systems reviewed and are negative except as documented in history of present illness above.   History reviewed. No pertinent past medical history.  Past Surgical History:  Procedure Laterality Date  . EXTERNAL EAR SURGERY      Social History:  reports that he has been smoking cigarettes. He has been smoking about 0.50 packs per day. He has never used smokeless tobacco. He reports current drug use. Drug: Marijuana. He reports that he does not drink alcohol.  No Known Allergies  Family History  Family history unknown: Yes     Prior to Admission medications   Medication Sig Start Date End Date Taking? Authorizing Provider  acetaminophen (TYLENOL) 325 MG tablet Take 2 tablets (650 mg total) by mouth every 6 (six) hours as needed for mild pain (or temp > 100). Patient not taking: Reported on 08/09/2019 09/13/17   Jill Alexanders, PA-C  ibuprofen (ADVIL,MOTRIN) 600 MG tablet Take 1 tablet (600 mg total) by mouth every 6 (six) hours as needed. Patient not taking: Reported on 08/09/2019 05/13/18   Rodell Perna A, PA-C  ondansetron (ZOFRAN) 4 MG tablet Take 1 tablet (4 mg total) by mouth every 6 (six) hours. Patient not taking: Reported on 08/09/2019 01/02/18   Okey Regal, PA-C  oxyCODONE-acetaminophen (  PERCOCET/ROXICET) 5-325 MG tablet Take 1 tablet by mouth every 6 (six) hours as needed for severe pain. Patient not taking: Reported on 08/09/2019 01/02/18   Eyvonne Mechanic, PA-C    Physical Exam: Vitals:   08/09/19 1803 08/09/19 1830 08/09/19 1900 08/09/19 1930  BP: (!) 163/91 (!) 152/96 (!) 147/82 (!) 143/83  Pulse: 77 69 61 60  Resp: 17  (!) 26 (!) 26 (!) 22  Temp: 98.3 F (36.8 C)     TempSrc: Oral     SpO2: 99% 98% 100% 94%   Constitutional: Resting in bed with head elevated, NAD, calm, comfortable Eyes: PERRL, EOMI, lids and conjunctivae normal ENMT: Mucous membranes are moist. Posterior pharynx clear of any exudate or lesions.Normal dentition.  Neck: normal, supple, no masses. Respiratory: clear to auscultation bilaterally, no wheezing, no crackles. Normal respiratory effort. No accessory muscle use.  Cardiovascular: Regular rate and rhythm, no murmurs / rubs / gallops. No extremity edema. 2+ pedal pulses. Abdomen: no tenderness, no masses palpated. No hepatosplenomegaly. Bowel sounds positive.  Musculoskeletal: no clubbing / cyanosis. No joint deformity upper and lower extremities. Good ROM, no contractures. Normal muscle tone.  Skin: no rashes, lesions, ulcers. No induration Neurologic: Expressive aphasia otherwise CN 2-12 grossly intact. Sensation intact, Strength 4/5 RUE otherwise 5/5 in all other extremities.[name]  Psychiatric: Awake and alert.    Labs on Admission: I have personally reviewed following labs and imaging studies  CBC: Recent Labs  Lab 08/09/19 1737 08/09/19 1738  WBC 7.1  --   NEUTROABS 4.3  --   HGB 14.6 14.6  HCT 42.7 43.0  MCV 94.1  --   PLT 213  --    Basic Metabolic Panel: Recent Labs  Lab 08/09/19 1737 08/09/19 1738  NA 139 141  K 3.6 3.5  CL 104 103  CO2 26  --   GLUCOSE 122* 118*  BUN 10 11  CREATININE 1.19 1.10  CALCIUM 9.0  --    GFR: CrCl cannot be calculated (Unknown ideal weight.). Liver Function Tests: Recent Labs  Lab 08/09/19 1737  AST 23  ALT 13  ALKPHOS 84  BILITOT 0.9  PROT 7.3  ALBUMIN 4.0   No results for input(s): LIPASE, AMYLASE in the last 168 hours. No results for input(s): AMMONIA in the last 168 hours. Coagulation Profile: Recent Labs  Lab 08/09/19 1812  INR 1.1   Cardiac Enzymes: No results for input(s): CKTOTAL, CKMB, CKMBINDEX,  TROPONINI in the last 168 hours. BNP (last 3 results) No results for input(s): PROBNP in the last 8760 hours. HbA1C: No results for input(s): HGBA1C in the last 72 hours. CBG: No results for input(s): GLUCAP in the last 168 hours. Lipid Profile: No results for input(s): CHOL, HDL, LDLCALC, TRIG, CHOLHDL, LDLDIRECT in the last 72 hours. Thyroid Function Tests: No results for input(s): TSH, T4TOTAL, FREET4, T3FREE, THYROIDAB in the last 72 hours. Anemia Panel: No results for input(s): VITAMINB12, FOLATE, FERRITIN, TIBC, IRON, RETICCTPCT in the last 72 hours. Urine analysis:    Component Value Date/Time   COLORURINE YELLOW 01/02/2018 1744   APPEARANCEUR HAZY (A) 01/02/2018 1744   LABSPEC 1.030 01/02/2018 1744   PHURINE 5.0 01/02/2018 1744   GLUCOSEU NEGATIVE 01/02/2018 1744   HGBUR SMALL (A) 01/02/2018 1744   BILIRUBINUR NEGATIVE 01/02/2018 1744   KETONESUR 20 (A) 01/02/2018 1744   PROTEINUR 30 (A) 01/02/2018 1744   NITRITE NEGATIVE 01/02/2018 1744   LEUKOCYTESUR NEGATIVE 01/02/2018 1744    Radiological Exams on Admission: CT Code Stroke CTA  Head W/WO contrast  Addendum Date: 08/09/2019   ADDENDUM REPORT: 08/09/2019 18:31 ADDENDUM: Please note there is also a moderate to severe stenosis within the A1 right anterior cerebral artery (series 8, image 21) (series 9, image 17). These results were called by telephone at the time of interpretation on 08/09/2019 at 6:31 pm to provider Dr. Laurence Slate, who verbally acknowledged these results. Electronically Signed   By: Jackey Loge DO   On: 08/09/2019 18:31   Result Date: 08/09/2019 CLINICAL DATA:  Stroke code. EXAM: CT ANGIOGRAPHY HEAD TECHNIQUE: Multidetector CT imaging of the head was performed using the standard protocol during bolus administration of intravenous contrast. Multiplanar CT image reconstructions and MIPs were obtained to evaluate the vascular anatomy. CONTRAST:  32mL OMNIPAQUE IOHEXOL 350 MG/ML SOLN COMPARISON:  Noncontrast head CT  performed earlier the same day. FINDINGS: Please refer to full dictation of the CTA head/neck and CT perfusion under the CT angiogram neck examination. IMPRESSION: Please refer to full dictation of the CTA head/neck and CT perfusion under the CT angiogram neck examination. Electronically Signed: By: Jackey Loge DO On: 08/09/2019 18:18   CT Code Stroke CTA Neck W/WO contrast  Result Date: 08/09/2019 CLINICAL DATA:  Stroke code. Focal neuro deficit, greater than 6 hours, stroke suspected. EXAM: CT ANGIOGRAPHY HEAD AND NECK CT PERFUSION BRAIN TECHNIQUE: Multidetector CT imaging of the head and neck was performed using the standard protocol during bolus administration of intravenous contrast. Multiplanar CT image reconstructions and MIPs were obtained to evaluate the vascular anatomy. Carotid stenosis measurements (when applicable) are obtained utilizing NASCET criteria, using the distal internal carotid diameter as the denominator. Multiphase CT imaging of the brain was performed following IV bolus contrast injection. Subsequent parametric perfusion maps were calculated using RAPID software. CONTRAST:  Administered contrast not known at this time COMPARISON:  Noncontrast head CT performed earlier the same day. FINDINGS: CTA NECK FINDINGS Aortic arch: Standard aortic branching. No significant innominate or proximal subclavian artery stenosis Right carotid system: CCA and ICA patent within the neck without stenosis. Mild mixed plaque within the carotid bulb. Left carotid system: CCA and ICA patent within the neck without stenosis. Vertebral arteries: Codominant. Patent within the neck without stenosis Skeleton: No acute bony abnormality. Cervical spondylosis with multilevel posterior disc osteophytes, uncovertebral and facet hypertrophy. Other neck: No neck mass or cervical lymphadenopathy. Upper chest: No consolidation within the imaged lung apices. Bullous emphysema. Review of the MIP images confirms the above  findings CTA HEAD FINDINGS Anterior circulation: The intracranial internal carotid arteries are patent without high-grade stenosis. 7 mm annular is arising from the cavernous left ICA (series 5, image 124). The M1 right middle cerebral artery is patent without significant stenosis. No right M2 proximal branch occlusion or high-grade proximal stenosis is identified. The M1 left middle cerebral artery is patent without significant stenosis. No definite M2 proximal branch occlusion is identified. There is a high-grade focal stenosis within a proximal inferior division left M2 branch (series 10, image 12). There is also a high-grade stenosis within a mid to distal superior division M2 branch (series 10, image 12). The anterior cerebral arteries are patent without high-grade proximal stenosis. Posterior circulation: The intracranial right vertebral artery is dominant and patent without significant stenosis. The intracranial left vertebral artery is developmentally diminutive and markedly irregular, but patent. The basilar artery is patent without significant stenosis. The bilateral posterior cerebral arteries are patent without significant proximal stenosis. Posterior communicating arteries are poorly delineated and may be hypoplastic or absent bilaterally.  Venous sinuses: Within limitations of contrast timing, no convincing thrombus. Anatomic variants: As described Review of the MIP images confirms the above findings CT Brain Perfusion Findings: ASPECTS: 9 CBF (<30%) Volume: 87mL Perfusion (Tmax>6.0s) volume: 58mL Mismatch Volume: 54mL IMPRESSION: CTA neck: 1. The bilateral common and internal carotid arteries are patent within the neck without significant stenosis. 2. The vertebral arteries are patent within the neck without stenosis. CTA head: 1. No intracranial large vessel occlusion. No definite proximal M2 left MCA branch occlusion is identified. 2. There is a high-grade stenosis within an inferior division proximal  left MCA M2 branch. Additional high-grade stenosis within the mid to distal superior division left MCA M2 branch. 3. 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 CT perfusion head: The perfusion software identifies no core infarct. The perfusion software identifies no critically hypoperfused parenchyma utilizing a Tmax>6 seconds threshold. However, there were clear changes of ischemic infarction within the mid left frontal lobe and left frontal operculum on noncontrast head CT performed immediately prior. Of note, there is a 9 mL region of hypoperfused parenchyma in this region when utilizing a Tmax > 4 seconds threshold. Electronically Signed   By: Jackey Loge DO   On: 08/09/2019 18:12   CT Code Stroke Cerebral Perfusion with contrast  Result Date: 08/09/2019 CLINICAL DATA:  Stroke code. EXAM: CT PERFUSION BRAIN TECHNIQUE: Multiphase CT imaging of the brain was performed following IV bolus contrast injection. Subsequent parametric perfusion maps were calculated using RAPID software. CONTRAST:  10mL OMNIPAQUE IOHEXOL 350 MG/ML SOLN COMPARISON:  Noncontrast head CT performed earlier the same day. FINDINGS: Please refer to complete dictation for the CTA head/neck and CT perfusion under the CTA neck dictation. IMPRESSION: Please refer to complete dictation for the CTA head/neck and CT perfusion under the CTA neck dictation. Electronically Signed   By: Jackey Loge DO   On: 08/09/2019 18:19   CT HEAD CODE STROKE WO CONTRAST  Addendum Date: 08/09/2019   ADDENDUM REPORT: 08/09/2019 17:57 ADDENDUM: These results were called by telephone at the time of interpretation on 08/09/2019 at 5:54 pm to provider Dr. Laurence Slate, who verbally acknowledged these results. Electronically Signed   By: Jackey Loge DO   On: 08/09/2019 17:57   Result Date: 08/09/2019 CLINICAL DATA:  Code stroke.  Aphasia EXAM: CT HEAD WITHOUT CONTRAST TECHNIQUE: Contiguous axial images were obtained from the base of the skull through the vertex  without intravenous contrast. COMPARISON:  No pertinent prior studies available for comparison. FINDINGS: Brain: There is no evidence of acute intracranial hemorrhage. There is abnormal hypodensity consistent with acute/early subacute infarction involving the mid left frontal lobe and left frontal operculum. There is no significant mass effect. No evidence of intracranial mass. No midline shift or extra-axial fluid collection. Vascular: No hyperdense vessel is identified. Skull: Normal. Negative for fracture or focal lesion. Sinuses/Orbits: Visualized orbits demonstrate no acute abnormality. Paranasal sinus disease. Most notably, there is near complete opacification left maxillary sinus. Right maxillary sinus mucous retention cyst. Scattered opacification of bilateral ethmoid air cells. No significant mastoid effusion. Other: There is a cutaneous/subcutaneous mass along the lateral aspect of the left temporal bone and involving the external ear which measures 7.0 x 2.3 cm in transaxial dimensions, likely reflecting malignancy (series 3, image 9) ASPECTS (Alberta Stroke Program Early CT Score) - Ganglionic level infarction (caudate, lentiform nuclei, internal capsule, insula, M1-M3 cortex): Seven - Supraganglionic infarction (M4-M6 cortex): 2 (point adducted for involvement of the left M5 territory) Total score (0-10  with 10 being normal):  9 IMPRESSION: Acute/early subacute ischemic infarction within the mid left frontal lobe and left frontal operculum. No evidence of hemorrhagic conversion. No significant mass effect. ASPECTS 9 Electronically Signed: By: Jackey Loge DO On: 08/09/2019 17:48    EKG: Independently reviewed. Normal sinus rhythm, QTC 484.  No prior for comparison.  Assessment/Plan Active Problems:   Acute ischemic stroke (HCC)  Aarnav J Simkin is a 54 y.o. male with medical history significant for tobacco use who is admitted with acute ischemic stroke.  Acute ischemic stroke: Presented with  expressive aphasia and right facial droop.  Ischemic infarct of the mid left frontal lobe and left frontal operculum noted on CT imaging.  Neurology consulted and following. -MRI brain -Echocardiogram -Start aspirin 81 mg daily, Plavix 75 mg daily -Start atorvastatin 40 mg daily -Allow for permissive hypertension -Continue neurochecks, monitor on telemetry -PT/OT/SLP eval -Check lipid panel and A1c  Tobacco use: Reports smoking half a pack per day.  He is advised on cessation.  Nicotine patch ordered.  DVT prophylaxis: Lovenox Code Status: Full code, confirmed with patient Family Communication: Discussed with patient significant other and sister by phone Disposition Plan: From home, disposition pending further stroke work-up, stroke team recommendations, PT/OT/SLP evaluations. Consults called: Neurology Admission status: Inpatient for management of acute ischemic stroke.   Darreld Mclean MD [facility]  If 7PM-7AM, please contact night-coverage [email or web address]  08/09/2019, 9:11 PM

## 2019-08-09 NOTE — ED Notes (Signed)
Pt sister called requesting update on pt. Pt states RN can notify his sister. Pt sister notified and pt also on phone with family.

## 2019-08-10 ENCOUNTER — Inpatient Hospital Stay (HOSPITAL_COMMUNITY): Payer: Managed Care, Other (non HMO)

## 2019-08-10 DIAGNOSIS — I340 Nonrheumatic mitral (valve) insufficiency: Secondary | ICD-10-CM

## 2019-08-10 DIAGNOSIS — Z720 Tobacco use: Secondary | ICD-10-CM

## 2019-08-10 LAB — LIPID PANEL
Cholesterol: 149 mg/dL (ref 0–200)
HDL: 34 mg/dL — ABNORMAL LOW (ref >40)
LDL Cholesterol: 97 mg/dL (ref 0–99)
Total CHOL/HDL Ratio: 4.4 RATIO
Triglycerides: 91 mg/dL (ref <150)
VLDL: 18 mg/dL (ref 0–40)

## 2019-08-10 LAB — HEMOGLOBIN A1C
Hgb A1c MFr Bld: 5.1 % (ref 4.8–5.6)
Mean Plasma Glucose: 99.67 mg/dL

## 2019-08-10 LAB — ECHOCARDIOGRAM COMPLETE

## 2019-08-10 LAB — SARS CORONAVIRUS 2 (TAT 6-24 HRS): SARS Coronavirus 2: NEGATIVE

## 2019-08-10 MED ORDER — CLOPIDOGREL BISULFATE 75 MG PO TABS: 75.0000 mg | ORAL_TABLET | Freq: Every day | ORAL | Status: DC

## 2019-08-10 MED ORDER — ATORVASTATIN CALCIUM 40 MG PO TABS: 40.0000 mg | ORAL_TABLET | Freq: Every day | ORAL | Status: DC

## 2019-08-10 MED ORDER — ASPIRIN 81 MG PO TBEC: 81.0000 mg | DELAYED_RELEASE_TABLET | Freq: Every day | ORAL | Status: DC

## 2019-08-10 MED ORDER — LORAZEPAM 2 MG/ML IJ SOLN
1.0000 mg | Freq: Once | INTRAMUSCULAR | Status: AC
  Administered 2019-08-13: 1 mg via INTRAVENOUS
  Filled 2019-08-10: qty 1

## 2019-08-10 NOTE — Progress Notes (Signed)
MRI called pt will be returning. Unable to do the MRI because pt will not allow them to. No orders were obtained from MD to give the pt. Will pass on in shift change.

## 2019-08-10 NOTE — Evaluation (Signed)
Physical Therapy Evaluation Patient Details Name: David [name] MRN: [id number] DOB: 21-Jun-1965 Today's Date: 08/10/2019   History of Present Illness  Pt is a 54 y.o. male who presented to the ED for evaluation of speech abnormality, aphasia,  and right facial droop. CT abnormal hypodensity consistent with acute/early subacute infarction involving the mid left frontal lobe and left frontal operculum. Pt demonstrates impaired safety and mobility likely due to acute branch vessel infarctions in L MCA as revealed by MRI.        Clinical Impression  David [name] demonstrates some expressive aphasia but is able to understand commands and properly sequence activities. He is mod I for bed mobility and min guard for transfers. He was able to ambulate ~300 ft w/ min guard for balance safety. He demonstrates narrow BOS and feeling like he is "weaving" L and R. Higher level of balance activities showed slowed gait velocity with vertical head turns and 1x slight LOB R during R head turn, otherwise unremarkable. He was able to navigate 2 stairs w/o rails w/min guard/supervision. Patient feels he is back to baseline in regards to functional mobility. No further acute PT recommended, DC home when appropriate.       Follow Up Recommendations No PT follow up;Supervision - Intermittent    Equipment Recommendations       Recommendations for Other Services Speech consult     Precautions / Restrictions Precautions Precautions: Fall Restrictions Weight Bearing Restrictions: No      Mobility  Bed Mobility Overal bed mobility: Modified Independent                Transfers Overall transfer level: Modified independent Equipment used: None                Ambulation/Gait Ambulation/Gait assistance: Min guard;Supervision Gait Distance (Feet): 200 Feet Assistive device: None Gait Pattern/deviations: Narrow base of support;Drifts right/left     General Gait Details: LOB x2 to R, min assist to  regain balance  Stairs Stairs: Yes Stairs assistance: Supervision;Min guard Stair Management: No rails Number of Stairs: 2    Wheelchair Mobility    Modified Rankin (Stroke Patients Only) Modified Rankin (Stroke Patients Only) Pre-Morbid Rankin Score: No symptoms Modified Rankin: Slight disability     Balance Overall balance assessment: Needs assistance Sitting-balance support: No upper extremity supported;Feet supported Sitting balance-Leahy Scale: Good     Standing balance support: During functional activity Standing balance-Leahy Scale: Good                               Pertinent Vitals/Pain Pain Assessment: No/denies pain    Home Living Family/patient expects to be discharged to:: Private residence Living Arrangements: Spouse/significant other(David [name]) Available Help at Discharge: Family Type of Home: House Home Access: Stairs to enter Entrance Stairs-Rails: Can reach both Entrance Stairs-Number of Steps: 2 Home Layout: One level Home Equipment: Crutches Additional Comments: work: Location manager     Prior Function Level of Independence: Independent               Hand Dominance   Dominant Hand: Right    Extremity/Trunk Assessment   Upper Extremity Assessment Upper Extremity Assessment: Overall WFL for tasks assessed    Lower Extremity Assessment Lower Extremity Assessment: Overall WFL for tasks assessed RLE Deficits / Details: ROM and Strength WNL RLE Sensation: WNL LLE Deficits / Details: ROM and Strength WNL LLE Sensation: WNL       Communication  Communication: Expressive difficulties  Cognition Arousal/Alertness: Awake/alert Behavior During Therapy: WFL for tasks assessed/performed Overall Cognitive Status: Within Functional Limits for tasks assessed                                 General Comments: A&O x4      General Comments General comments (skin integrity, edema, etc.): BP 152/93 (pre-session),  148/77 (post session)    Exercises     Assessment/Plan    PT Assessment Patent does not need any further PT services  PT Problem List         PT Treatment Interventions      PT Goals (Current goals can be found in the Care Plan section)  Acute Rehab PT Goals Patient Stated Goal: "return to normal" PT Goal Formulation: With patient Time For Goal Achievement: 08/24/19 Potential to Achieve Goals: Good    Frequency     Barriers to discharge        Co-evaluation               AM-PAC PT "6 Clicks" Mobility  Outcome Measure Help needed turning from your back to your side while in a flat bed without using bedrails?: None Help needed moving from lying on your back to sitting on the side of a flat bed without using bedrails?: None Help needed moving to and from a bed to a chair (including a wheelchair)?: A Little Help needed standing up from a chair using your arms (e.g., wheelchair or bedside chair)?: None Help needed to walk in hospital room?: A Little Help needed climbing 3-5 steps with a railing? : A Little 6 Click Score: 21    End of Session Equipment Utilized During Treatment: Gait belt Activity Tolerance: Patient tolerated treatment well Patient left: in bed;with call bell/phone within reach;with family/visitor present;with bed alarm set Nurse Communication: Mobility status PT Visit Diagnosis: Other abnormalities of gait and mobility (R26.89)    Time: 0355-9741 PT Time Calculation (min) (ACUTE ONLY): 23 min   Charges:   PT Evaluation $PT Eval Low Complexity: 1 Low PT Treatments $Gait Training: 8-22 mins        [name], SPT Acute Rehab  [phone]  [name] 08/10/2019, 1:49 PM

## 2019-08-10 NOTE — Progress Notes (Signed)
PROGRESS NOTE    David [name]  JKD:[id number] DOB: [birth year] DOA: 08/09/2019 PCP: Patient, No Pcp Per     Brief Narrative:  54 year old man admitted from home on 3/5 with complaints of a speech abnormality and a facial droop.  His past medical history significant for tobacco abuse.  He has significant expressive aphasia.  He was admitted as a stroke outside of window for TPA and admission was requested for further evaluation and management.   Assessment & Plan:   Active Problems:   Acute ischemic stroke (HCC)   Tobacco abuse   Acute ischemic CVA -CT head showed an acute/early subacute ischemic infarction within the mid left frontal lobe and left frontal operculum without evidence of hemorrhagic conversion. -MRI showed acute branch vessel infarctions in the left MCA territory.  Other punctate foci of acute infarction are visible. -2D echo and carotid Dopplers are pending. -Plan is for dual antiplatelet therapy for 3 weeks and then aspirin alone. -PT/OT/ST recommendations are pending. -LDL is 97, goal less than 70, started on Lipitor 40 mg. -Will arrange for 30-day cardiac monitor upon discharge.  Tobacco abuse -Counseled on cessation.   DVT prophylaxis: Lovenox Code Status: Full code Family Communication: Fianc at bedside updated on plan of care and all questions answered Disposition Plan: Home pending completion of stroke work-up, likely 24 hours.  Consultants:   Neurology  Procedures:   None  Antimicrobials:  Anti-infectives (From admission, onward)   None       Subjective: Lying in bed, significant expressive aphasia is evident.  He has no complaints.  Objective: Vitals:   08/10/19 0500 08/10/19 0700 08/10/19 0814 08/10/19 1218  BP: (!) 141/86 (!) 143/89 (!) 146/83 (!) 146/85  Pulse: 60 65 78 76  Resp: (!) 21 (!) 22 16 19   Temp: 98.3 F (36.8 C) 98.4 F (36.9 C) 98.4 F (36.9 C) 98.6 F (37 C)  TempSrc: Oral Oral Oral Oral  SpO2: 96% 97% 99%  100%    Intake/Output Summary (Last 24 hours) at 08/10/2019 1404 Last data filed at 08/10/2019 1217 Gross per 24 hour  Intake 120 ml  Output 200 ml  Net -80 ml   There were no vitals filed for this visit.  Examination:  General exam: Alert, awake, oriented x 3 Respiratory system: Clear to auscultation. Respiratory effort normal. Cardiovascular system:RRR. No murmurs, rubs, gallops. Gastrointestinal system: Abdomen is nondistended, soft and nontender. No organomegaly or masses felt. Normal bowel sounds heard. Central nervous system: Alert and oriented. No focal neurological deficits. Extremities: No C/C/E, +pedal pulses Skin: No rashes, lesions or ulcers Psychiatry: Judgement and insight appear normal. Mood & affect appropriate.     Data Reviewed: I have personally reviewed following labs and imaging studies  CBC: Recent Labs  Lab 08/09/19 1737 08/09/19 1738  WBC 7.1  --   NEUTROABS 4.3  --   HGB 14.6 14.6  HCT 42.7 43.0  MCV 94.1  --   PLT 213  --    Basic Metabolic Panel: Recent Labs  Lab 08/09/19 1737 08/09/19 1738  NA 139 141  K 3.6 3.5  CL 104 103  CO2 26  --   GLUCOSE 122* 118*  BUN 10 11  CREATININE 1.19 1.10  CALCIUM 9.0  --    GFR: CrCl cannot be calculated (Unknown ideal weight.). Liver Function Tests: Recent Labs  Lab 08/09/19 1737  AST 23  ALT 13  ALKPHOS 84  BILITOT 0.9  PROT 7.3  ALBUMIN 4.0   No results  for input(s): LIPASE, AMYLASE in the last 168 hours. No results for input(s): AMMONIA in the last 168 hours. Coagulation Profile: Recent Labs  Lab 08/09/19 1812  INR 1.1   Cardiac Enzymes: No results for input(s): CKTOTAL, CKMB, CKMBINDEX, TROPONINI in the last 168 hours. BNP (last 3 results) No results for input(s): PROBNP in the last 8760 hours. HbA1C: Recent Labs    08/10/19 0439  HGBA1C 5.1   CBG: No results for input(s): GLUCAP in the last 168 hours. Lipid Profile: Recent Labs    08/10/19 0439  CHOL 149  HDL 34*    LDLCALC 97  TRIG 91  CHOLHDL 4.4   Thyroid Function Tests: No results for input(s): TSH, T4TOTAL, FREET4, T3FREE, THYROIDAB in the last 72 hours. Anemia Panel: No results for input(s): VITAMINB12, FOLATE, FERRITIN, TIBC, IRON, RETICCTPCT in the last 72 hours. Urine analysis:    Component Value Date/Time   COLORURINE STRAW (A) 08/09/2019 2117   APPEARANCEUR CLEAR 08/09/2019 2117   LABSPEC 1.034 (H) 08/09/2019 2117   PHURINE 6.0 08/09/2019 2117   GLUCOSEU NEGATIVE 08/09/2019 2117   HGBUR NEGATIVE 08/09/2019 2117   BILIRUBINUR NEGATIVE 08/09/2019 2117   KETONESUR NEGATIVE 08/09/2019 2117   PROTEINUR NEGATIVE 08/09/2019 2117   NITRITE NEGATIVE 08/09/2019 2117   LEUKOCYTESUR TRACE (A) 08/09/2019 2117   Sepsis Labs: @LABRCNTIP (procalcitonin:4,lacticidven:4)  ) Recent Results (from the past 240 hour(s))  SARS CORONAVIRUS 2 (TAT 6-24 HRS) Nasopharyngeal     Status: None   Collection Time: 08/09/19  9:09 PM   Specimen: Nasopharyngeal  Result Value Ref Range Status   SARS Coronavirus 2 NEGATIVE NEGATIVE Final    Comment: (NOTE) SARS-CoV-2 target nucleic acids are NOT DETECTED. The SARS-CoV-2 RNA is generally detectable in upper and lower respiratory specimens during the acute phase of infection. Negative results do not preclude SARS-CoV-2 infection, do not rule out co-infections with other pathogens, and should not be used as the sole basis for treatment or other patient management decisions. Negative results must be combined with clinical observations, patient history, and epidemiological information. The expected result is Negative. Fact Sheet for Patients: HairSlick.no Fact Sheet for Healthcare Providers: quierodirigir.com This test is not yet approved or cleared by the [place] FDA and  has been authorized for detection and/or diagnosis of SARS-CoV-2 by FDA under an Emergency Use Authorization (EUA). This EUA will  remain  in effect (meaning this test can be used) for the duration of the COVID-19 declaration under Section 56 4(b)(1) of the Act, 21 U.S.C. section 360bbb-3(b)(1), unless the authorization is terminated or revoked sooner. Performed at [facility], [address], [place], [place] [address]          Radiology Studies: CT Code Stroke CTA Head W/WO contrast  Addendum Date: 08/09/2019   ADDENDUM REPORT: 08/09/2019 18:31 ADDENDUM: Please note there is also a moderate to severe stenosis within the A1 right anterior cerebral artery (series 8, image 21) (series 9, image 17). These results were called by telephone at the time of interpretation on 08/09/2019 at 6:31 pm to provider Dr. Laurence Slate, who verbally acknowledged these results. Electronically Signed   By: Jackey Loge DO   On: 08/09/2019 18:31   Result Date: 08/09/2019 CLINICAL DATA:  Stroke code. EXAM: CT ANGIOGRAPHY HEAD TECHNIQUE: Multidetector CT imaging of the head was performed using the standard protocol during bolus administration of intravenous contrast. Multiplanar CT image reconstructions and MIPs were obtained to evaluate the vascular anatomy. CONTRAST:  27mL OMNIPAQUE IOHEXOL 350 MG/ML SOLN  COMPARISON:  Noncontrast head CT performed earlier the same day. FINDINGS: Please refer to full dictation of the CTA head/neck and CT perfusion under the CT angiogram neck examination. IMPRESSION: Please refer to full dictation of the CTA head/neck and CT perfusion under the CT angiogram neck examination. Electronically Signed: By: Kellie Simmering DO On: 08/09/2019 18:18   CT Code Stroke CTA Neck W/WO contrast  Addendum Date: 08/09/2019   ADDENDUM REPORT: 08/09/2019 22:14 ADDENDUM: Please note there is also a moderate to severe stenosis within the A1 right anterior cerebral artery (series 8, image 21) (series 9, image 17). These results were called by telephone at the time of interpretation on 08/09/2019 at 6:31 pm to provider Dr. Lorraine Lax, who verbally  acknowledged these results. Electronically Signed   By: Kellie Simmering DO   On: 08/09/2019 22:14   Result Date: 08/09/2019 CLINICAL DATA:  Stroke code. Focal neuro deficit, greater than 6 hours, stroke suspected. EXAM: CT ANGIOGRAPHY HEAD AND NECK CT PERFUSION BRAIN TECHNIQUE: Multidetector CT imaging of the head and neck was performed using the standard protocol during bolus administration of intravenous contrast. Multiplanar CT image reconstructions and MIPs were obtained to evaluate the vascular anatomy. Carotid stenosis measurements (when applicable) are obtained utilizing NASCET criteria, using the distal internal carotid diameter as the denominator. Multiphase CT imaging of the brain was performed following IV bolus contrast injection. Subsequent parametric perfusion maps were calculated using RAPID software. CONTRAST:  Administered contrast not known at this time COMPARISON:  Noncontrast head CT performed earlier the same day. FINDINGS: CTA NECK FINDINGS Aortic arch: Standard aortic branching. No significant innominate or proximal subclavian artery stenosis Right carotid system: CCA and ICA patent within the neck without stenosis. Mild mixed plaque within the carotid bulb. Left carotid system: CCA and ICA patent within the neck without stenosis. Vertebral arteries: Codominant. Patent within the neck without stenosis Skeleton: No acute bony abnormality. Cervical spondylosis with multilevel posterior disc osteophytes, uncovertebral and facet hypertrophy. Other neck: No neck mass or cervical lymphadenopathy. Upper chest: No consolidation within the imaged lung apices. Bullous emphysema. Review of the MIP images confirms the above findings CTA HEAD FINDINGS Anterior circulation: The intracranial internal carotid arteries are patent without high-grade stenosis. 7 mm annular is arising from the cavernous left ICA (series 5, image 124). The M1 right middle cerebral artery is patent without significant stenosis. No  right M2 proximal branch occlusion or high-grade proximal stenosis is identified. The M1 left middle cerebral artery is patent without significant stenosis. No definite M2 proximal branch occlusion is identified. There is a high-grade focal stenosis within a proximal inferior division left M2 branch (series 10, image 12). There is also a high-grade stenosis within a mid to distal superior division M2 branch (series 10, image 12). The anterior cerebral arteries are patent without high-grade proximal stenosis. Posterior circulation: The intracranial right vertebral artery is dominant and patent without significant stenosis. The intracranial left vertebral artery is developmentally diminutive and markedly irregular, but patent. The basilar artery is patent without significant stenosis. The bilateral posterior cerebral arteries are patent without significant proximal stenosis. Posterior communicating arteries are poorly delineated and may be hypoplastic or absent bilaterally. Venous sinuses: Within limitations of contrast timing, no convincing thrombus. Anatomic variants: As described Review of the MIP images confirms the above findings CT Brain Perfusion Findings: ASPECTS: 9 CBF (<30%) Volume: 49mL Perfusion (Tmax>6.0s) volume: 31mL Mismatch Volume: 72mL IMPRESSION: CTA neck: 1. The bilateral common and internal carotid arteries are patent within the neck  without significant stenosis. 2. The vertebral arteries are patent within the neck without stenosis. CTA head: 1. No intracranial large vessel occlusion. No definite proximal M2 left MCA branch occlusion is identified. 2. There is a high-grade stenosis within an inferior division proximal left MCA M2 branch. Additional high-grade stenosis within the mid to distal superior division left MCA M2 branch. 3. 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 CT perfusion head: The perfusion software identifies no core infarct. The perfusion software identifies no critically  hypoperfused parenchyma utilizing a Tmax>6 seconds threshold. However, there were clear changes of ischemic infarction within the mid left frontal lobe and left frontal operculum on noncontrast head CT performed immediately prior. Of note, there is a 9 mL region of hypoperfused parenchyma in this region when utilizing a Tmax > 4 seconds threshold. Electronically Signed: By: Jackey Loge DO On: 08/09/2019 18:12   MR BRAIN WO CONTRAST  Result Date: 08/10/2019 CLINICAL DATA:  Right facial droop.  Follow-up acute stroke. EXAM: MRI HEAD WITHOUT CONTRAST TECHNIQUE: Multiplanar, multiecho pulse sequences of the brain and surrounding structures were obtained without intravenous contrast. COMPARISON:  CT studies 08/09/2019 FINDINGS: Brain: No abnormality seen affecting the brainstem, cerebellum or right cerebral hemisphere. In the left cerebral hemisphere, there is a confluent acute infarction measuring 3 x 3 x 1.5 cm within the left frontal operculum. Few other punctate foci of acute infarction posterior to that in the left posterior frontal and frontoparietal junction regions. No evidence of mass effect or hemorrhage. Findings are consistent with left MCA territory embolic infarctions. Vascular: Major vessels at the base of the brain show flow. Skull and upper cervical spine: Negative Sinuses/Orbits: Inflammatory rhinosinusitis, most pronounced affecting the left maxillary sinus. Other: None IMPRESSION: Acute branch vessel infarctions in the left MCA territory. Largest infarction is a 3 x 3 x 1.5 cm left frontal operculum cortical and subcortical infarction. Few other punctate foci of acute infarction posterior to that at the left posterior frontal vertex and frontoparietal vertex. No mass effect. No blood products. Remainder of the brain is normal. Electronically Signed   By: Paulina Fusi M.D.   On: 08/10/2019 02:55   CT Code Stroke Cerebral Perfusion with contrast  Result Date: 08/09/2019 CLINICAL DATA:  Stroke  code. EXAM: CT PERFUSION BRAIN TECHNIQUE: Multiphase CT imaging of the brain was performed following IV bolus contrast injection. Subsequent parametric perfusion maps were calculated using RAPID software. CONTRAST:  47mL OMNIPAQUE IOHEXOL 350 MG/ML SOLN COMPARISON:  Noncontrast head CT performed earlier the same day. FINDINGS: Please refer to complete dictation for the CTA head/neck and CT perfusion under the CTA neck dictation. IMPRESSION: Please refer to complete dictation for the CTA head/neck and CT perfusion under the CTA neck dictation. Electronically Signed   By: Jackey Loge DO   On: 08/09/2019 18:19   CT HEAD CODE STROKE WO CONTRAST  Addendum Date: 08/09/2019   ADDENDUM REPORT: 08/09/2019 22:25 ADDENDUM: Findings omitted from the impression. 7.0 x 2.3 cm cutaneous/subcutaneous soft tissue mass along the lateral aspect of the left temporal bone, involving the left external ear, highly suspicious for malignancy. This finding was discussed with Dr. Laurence Slate at the time of the original dictation at 5:54 p.m. on 08/09/19. Electronically Signed   By: Jackey Loge DO   On: 08/09/2019 22:25   Addendum Date: 08/09/2019   ADDENDUM REPORT: 08/09/2019 17:57 ADDENDUM: These results were called by telephone at the time of interpretation on 08/09/2019 at 5:54 pm to provider Dr. Laurence Slate, who  verbally acknowledged these results. Electronically Signed   By: Jackey Loge DO   On: 08/09/2019 17:57   Result Date: 08/09/2019 CLINICAL DATA:  Code stroke.  Aphasia EXAM: CT HEAD WITHOUT CONTRAST TECHNIQUE: Contiguous axial images were obtained from the base of the skull through the vertex without intravenous contrast. COMPARISON:  No pertinent prior studies available for comparison. FINDINGS: Brain: There is no evidence of acute intracranial hemorrhage. There is abnormal hypodensity consistent with acute/early subacute infarction involving the mid left frontal lobe and left frontal operculum. There is no significant mass effect. No  evidence of intracranial mass. No midline shift or extra-axial fluid collection. Vascular: No hyperdense vessel is identified. Skull: Normal. Negative for fracture or focal lesion. Sinuses/Orbits: Visualized orbits demonstrate no acute abnormality. Paranasal sinus disease. Most notably, there is near complete opacification left maxillary sinus. Right maxillary sinus mucous retention cyst. Scattered opacification of bilateral ethmoid air cells. No significant mastoid effusion. Other: There is a cutaneous/subcutaneous mass along the lateral aspect of the left temporal bone and involving the external ear which measures 7.0 x 2.3 cm in transaxial dimensions, likely reflecting malignancy (series 3, image 9) ASPECTS ([place] Stroke Program Early CT Score) - Ganglionic level infarction (caudate, lentiform nuclei, internal capsule, insula, M1-M3 cortex): Seven - Supraganglionic infarction (M4-M6 cortex): 2 (point adducted for involvement of the left M5 territory) Total score (0-10 with 10 being normal):  9 IMPRESSION: Acute/early subacute ischemic infarction within the mid left frontal lobe and left frontal operculum. No evidence of hemorrhagic conversion. No significant mass effect. ASPECTS 9 Electronically Signed: By: Jackey Loge DO On: 08/09/2019 17:48        Scheduled Meds: . aspirin EC  81 mg Oral Daily  . atorvastatin  40 mg Oral q1800  . clopidogrel  75 mg Oral Daily  . enoxaparin (LOVENOX) injection  40 mg Subcutaneous Q24H  . LORazepam  1 mg Intravenous Once   Continuous Infusions:   LOS: 1 day    Time spent: 35 minutes. Greater than 50% of this time was spent in direct contact with the patient, coordinating care and discussing relevant ongoing clinical issues.     Chaya Jan, MD [facility] Pager [phone]  If 7PM-7AM, please contact night-coverage [email or web address] Password [facility] 08/10/2019, 2:04 PM

## 2019-08-10 NOTE — Progress Notes (Signed)
PT Progress Note for Charges    08/10/19 1300  PT Visit Information  Last PT Received On 08/10/19  PT General Charges  $$ ACUTE PT VISIT 1 Visit  PT Evaluation  $PT Eval Low Complexity 1 Low  PT Treatments  $Gait Training 8-22 mins  Arletta Bale, DPT  Acute Rehabilitation Services Pager [phone] Office [phone]

## 2019-08-10 NOTE — Progress Notes (Signed)
STROKE TEAM PROGRESS NOTE   HISTORY OF PRESENT ILLNESS (per record) Abdulwahab J Hem is an 54 y.o. male  With no known PMH presented to [facility] ED as a code stroke for slurred speech and  facial droop.  Per EMS patient was last seen normal last night when he went to bed. He woke up and went to work. Came home and wife noted facial droop and slurred speech. EMS was called. Per patient he woke up this morning feeling completely normal, but he can't verbalize when he felt different or what happened. Denies numbness, tingling visual changes ETOH. Endorses smoking 2 packs of cigarettes per day, and smoking marijuana. Denies HTN, DM or any previous strokes, blood thinners. ED course:  CTH: negative for hemorrhage; ASPECTS 9, acute/subacute infarction in the mid left frontal lobe and left frontal operculum.  BP: 180/110 BG: 149 CTA: no LVO; left  ICA 70mm annular  Date last known well:08/08/2019 Time last known well: 2100 tPA Given: {no; outside of TPA window Modified Rankin: Rankin Score=0 NIHSS: 3   INTERVAL HISTORY I have personally reviewed history of presenting illness with the patient, reviewed electronic medical records and imaging films in PACS.  He presented with slurred speech and right facial droop and MRI scan shows a left frontal MCA branch infarct with CT angiogram showing distal M2 M3 high-grade stenosis likely from embolus which has partially broken up.  He denies any prior history of strokes TIAs significant neurological problems.    OBJECTIVE Vitals:   08/10/19 0108 08/10/19 0300 08/10/19 0500 08/10/19 0700  BP:  133/79 (!) 141/86 (!) 143/89  Pulse: 82 61 60 65  Resp: 18 (!) 24 (!) 21 (!) 22  Temp: 98.3 F (36.8 C) 98.4 F (36.9 C) 98.3 F (36.8 C) 98.4 F (36.9 C)  TempSrc: Oral Oral Oral Oral  SpO2: 99% 97% 96% 97%    CBC:  Recent Labs  Lab 08/09/19 1737 08/09/19 1738  WBC 7.1  --   NEUTROABS 4.3  --   HGB 14.6 14.6  HCT 42.7 43.0  MCV 94.1  --   PLT 213  --      Basic Metabolic Panel:  Recent Labs  Lab 08/09/19 1737 08/09/19 1738  NA 139 141  K 3.6 3.5  CL 104 103  CO2 26  --   GLUCOSE 122* 118*  BUN 10 11  CREATININE 1.19 1.10  CALCIUM 9.0  --     Lipid Panel:     Component Value Date/Time   CHOL 149 08/10/2019 0439   TRIG 91 08/10/2019 0439   HDL 34 (L) 08/10/2019 0439   CHOLHDL 4.4 08/10/2019 0439   VLDL 18 08/10/2019 0439   LDLCALC 97 08/10/2019 0439   HgbA1c:  Lab Results  Component Value Date   HGBA1C 5.1 08/10/2019   Urine Drug Screen:     Component Value Date/Time   LABOPIA NONE DETECTED 08/09/2019 2118   COCAINSCRNUR NONE DETECTED 08/09/2019 2118   LABBENZ NONE DETECTED 08/09/2019 2118   AMPHETMU NONE DETECTED 08/09/2019 2118   THCU POSITIVE (A) 08/09/2019 2118   LABBARB NONE DETECTED 08/09/2019 2118    Alcohol Level     Component Value Date/Time   ETH <10 08/09/2019 1737    IMAGING  CT Code Stroke CTA Head W/WO contrast CT Code Stroke CTA Neck W/WO contrast CT Code Stroke Cerebral Perfusion with contrast IMPRESSION:   CTA neck:  1. The bilateral common and internal carotid arteries are patent within the neck without significant  stenosis.  2. The vertebral arteries are patent within the neck without stenosis.   CTA head:  1. No intracranial large vessel occlusion. No definite proximal M2 left MCA branch occlusion is identified.  2. There is a high-grade stenosis within an inferior division proximal left MCA M2 branch. Additional high-grade stenosis within the mid to distal superior division left MCA M2 branch.  3. 7 mm aneurysm arising from the cavernous left ICA  CT perfusion head:  The perfusion software identifies no core infarct. The perfusion software identifies no critically hypoperfused parenchyma utilizing a Tmax>6 seconds threshold. However, there were clear changes of ischemic infarction within the mid left frontal lobe and left frontal operculum on noncontrast head CT performed  immediately prior. Of note, there is a 9 mL region of hypoperfused parenchyma in this region when utilizing a Tmax > 4 seconds threshold.   MR BRAIN WO CONTRAST 08/10/2019 IMPRESSION:  Acute branch vessel infarctions in the left MCA territory. Largest infarction is a 3 x 3 x 1.5 cm left frontal operculum cortical and subcortical infarction. Few other punctate foci of acute infarction posterior to that at the left posterior frontal vertex and frontoparietal vertex. No mass effect. No blood products. Remainder of the brain is normal.   CT HEAD CODE STROKE WO CONTRAST 08/09/2019   ADDENDUM:  Findings omitted from the impression. 7.0 x 2.3 cm cutaneous/subcutaneous soft tissue mass along the lateral aspect of the left temporal bone, involving the left external ear, highly suspicious for malignancy.   08/09/2019 IMPRESSION:  Acute/early subacute ischemic infarction within the mid left frontal lobe and left frontal operculum. No evidence of hemorrhagic conversion. No significant mass effect. ASPECTS 9       Transthoracic Echocardiogram  00/00/2021 Pending    Bilateral Carotid Dopplers  00/00/2021 Pending   ECG - SR rate 80 BPM. (See cardiology reading for complete details)   PHYSICAL EXAM Blood pressure (!) 143/89, pulse 65, temperature 98.4 F (36.9 C), temperature source Oral, resp. rate (!) 22, SpO2 97 %. Pleasant middle-aged African-American male not in distress. . Afebrile. Head is nontraumatic. Neck is supple without bruit.    Cardiac exam no murmur or gallop. Lungs are clear to auscultation. Distal pulses are well felt. Neurological Exam ;  Awake  Alert oriented x 3. Normal speech and language except mild word finding difficulty but no dysarthria.Marland [place] movements full without nystagmus.fundi were not visualized. Vision acuity and fields appear normal. Hearing is normal. Palatal movements are normal. Face symmetric. Tongue midline. Normal strength, tone, reflexes and coordination.  Normal sensation. Gait deferred.      ASSESSMENT/PLAN Mr. KIEFFER BLATZ is a 54 y.o. male with history of tobacco and marijuana use presenting with slurred speech and  facial droop . He did not receive IV t-PA due to late presentation (>4.5 hours from time of onset)  Stroke - left MCA infarcts -likely cryptogenic high-grade stenosis within the left MCA M2 branch likely represents subtotally resolved embolus  Resultant mild speech difficulty  Code Stroke CT Head - Acute/early subacute ischemic infarction within the mid left frontal lobe and left frontal operculum. No evidence of hemorrhagic conversion. No significant mass effect. ASPECTS 9   CT head - not ordered  MRI head - Acute branch vessel infarctions in the left MCA territory. Largest infarction is a 3 x 3 x 1.5 cm left frontal operculum cortical and subcortical infarction. Few other punctate foci of acute infarction posterior to that at the left posterior frontal vertex and frontoparietal vertex.  MRA head - not ordered  CTA H&N - There is a high-grade stenosis within an inferior division proximal left MCA M2 branch. Additional high-grade stenosis within the mid to distal superior division left MCA M2 branch.   CT Perfusion - The perfusion software identifies no core infarct.  Carotid Doppler - CTA neck performed - carotid dopplers not indicated.  2D Echo - pending  Lacey Jensen Virus 2 - negative  LDL - 97  HgbA1c - 5.1  UDS - positive for THCU  VTE prophylaxis - Lovenox Diet  Diet Order            Diet Heart Room service appropriate? Yes; Fluid consistency: Thin  Diet effective now              No antithrombotic prior to admission, now on aspirin 81 mg daily and clopidogrel 75 mg daily  Patient counseled to be compliant with his antithrombotic medications  Ongoing aggressive stroke risk factor management  Therapy recommendations:  pending  Disposition:  Pending  Hypertension  Home BP meds: none    Current BP meds: none   Stable . Permissive hypertension (OK if < 220/120) but gradually normalize in 5-7 days  . Long-term BP goal normotensive  Hyperlipidemia  Home Lipid lowering medication: none   LDL 97, goal < 70  Current lipid lowering medication: Lipitor 40 mg daily   Continue statin at discharge  Other Stroke Risk Factors  Cigarette smoker - advised to stop smoking  Family hx stroke - no history on file  Substance Abuse - marijuana  Other Active Problems  Code status - Full Code  7 mm aneurysm arising from the cavernous left ICA  7.0 x 2.3 cm cutaneous/subcutaneous soft tissue mass along the lateral aspect of the left temporal bone, involving the left external ear, highly suspicious for malignancy.     Hospital day # 1 He presented with speech difficulties and right facial droop secondary to left MCA branch infarct of cryptogenic etiology.  Recommend aspirin Plavix followed by aspirin alone.  Check loop recorder and TEE which may be scheduled as an outpatient.  Patient counseled to quit smoking cigarettes and marijuana and is agreeable.  Continue ongoing stroke work-up.  Aggressive risk factor modification.  Discussed with Dr. Jerilee Hoh.  Greater than 50% time during this 35-minute visit was spent on counseling and coordination of care about his stroke and answering questions. Antony Contras, MD To contact Stroke Continuity provider, please refer to [email or web address] After hours, contact General Neurology

## 2019-08-10 NOTE — Evaluation (Signed)
Speech Language Pathology Evaluation Patient Details Name: David [name] MRN: [id number] DOB: 03-24-1966 Today's Date: 08/10/2019 Time: 1010-1035 SLP Time Calculation (min) (ACUTE ONLY): 25 min  Problem List:  Patient Active Problem List   Diagnosis Date Noted  . Acute ischemic stroke (HCC) 08/09/2019  . Intussusception of jejunum (HCC) 09/12/2017  . Tailor's bunion of both feet 01/06/2017  . Onychomycosis 01/06/2017  . Foot callus 01/05/2017  . Foreign body in foot 01/05/2017   Past Medical History: History reviewed. No pertinent past medical history. Past Surgical History:  Past Surgical History:  Procedure Laterality Date  . EXTERNAL EAR SURGERY     HPI:  54 y.o. male with medical history significant for tobacco use who presented to the ED for evaluation of speech abnormality, aphasia,  and right facial droop. CT abnormal hypodensity consistent with acute/early subacute infarction involving the mid left frontal lobe and left frontal operculum.    Assessment / Plan / Recommendation Clinical Impression  Pt presents with a mild expressive>receptive aphasia with mild dysfluency at conversational levels. There are notable hesitations due to deficits in word-retrieval and there is difficulty generating sentences to describe himself or verbally sequence basic steps to a task.  Basic confrontational naming, repetition of high and low-frequency words/sentences, responsive naming, three-step command-following are all WNL.  Break-down in comprehension is evident when answering questions after listening to structured short paragraphs.  Speech clarity is normal (no dysarthria).  Recommend ongoing SLP intervention while in acute care to address increasing length of utterance and word retrieval.  Pt educated regarding aphasia and its impact on communication. Disposition TBA.     SLP Assessment  SLP Recommendation/Assessment: Patient needs continued Speech Lanaguage Pathology Services SLP Visit  Diagnosis: Aphasia (R47.01)    Follow Up Recommendations  Other (comment)(tba)    Frequency and Duration min 3x week  2 weeks      SLP Evaluation Cognition  Arousal/Alertness: Awake/alert Orientation Level: Oriented X4 Attention: Sustained Sustained Attention: Appears intact       Comprehension  Auditory Comprehension Overall Auditory Comprehension: Impaired Yes/No Questions: Within Functional Limits Commands: Within Functional Limits Conversation: Complex Interfering Components: Processing speed EffectiveTechniques: Extra processing time Reading Comprehension Reading Status: Not tested    Expression Expression Primary Mode of Expression: Verbal Verbal Expression Overall Verbal Expression: Impaired Initiation: No impairment Automatic Speech: Name;Social Response Level of Generative/Spontaneous Verbalization: Sentence Repetition: No impairment Naming: Impairment Responsive: 76-100% accurate Confrontation: Within functional limits Divergent: 50-74% accurate   Oral / Motor  Oral Motor/Sensory Function Overall Oral Motor/Sensory Function: Within functional limits Motor Speech Overall Motor Speech: Appears within functional limits for tasks assessed   GO                    Blenda Mounts Laurice 08/10/2019, 10:47 AM  Marchelle Folks L. Samson Frederic, MA CCC/SLP Acute Rehabilitation Services Office number [phone] Pager [phone]

## 2019-08-10 NOTE — Progress Notes (Signed)
  Echocardiogram 2D Echocardiogram has been performed.  Prophet Renwick A Nannette Zill 08/10/2019, 3:18 PM

## 2019-08-10 NOTE — Progress Notes (Signed)
Pt went down for MRI

## 2019-08-10 NOTE — Progress Notes (Signed)
MRI called back and the pt is willing to try. Pt has been in and out with his moods. One moment everything is no and he has a attitude. Then, the next he would be pleasant and cooperating.

## 2019-08-10 NOTE — Evaluation (Signed)
Occupational Therapy Evaluation Patient Details Name: David [name] MRN: [id number] DOB: 07-22-1965 Today's Date: 08/10/2019    History of Present Illness Pt is a 55 y.o. male who presented to the ED for evaluation of speech abnormality, aphasia,  and right facial droop. CT abnormal hypodensity consistent with acute/early subacute infarction involving the mid left frontal lobe and left frontal operculum. Pt demonstrates impaired safety and mobility likely due to acute branch vessel infarctions in L MCA as revealed by MRI.       Clinical Impression   Patient evaluated by Occupational Therapy with no further acute OT needs identified. All education has been completed and the patient has no further questions. Pt is independent with ADLs, cognition and vision appear intact during complex path finding task.   See below for any follow-up Occupational Therapy or equipment needs. OT is signing off. Thank you for this referral.      Follow Up Recommendations  No OT follow up    Equipment Recommendations  None recommended by OT    Recommendations for Other Services       Precautions / Restrictions Precautions Precautions: Fall Restrictions Weight Bearing Restrictions: No      Mobility Bed Mobility Overal bed mobility: Independent                Transfers Overall transfer level: Independent Equipment used: None                  Balance Overall balance assessment: Needs assistance;No apparent balance deficits (not formally assessed)                                         ADL either performed or assessed with clinical judgement   ADL Overall ADL's : Independent                                             Vision Baseline Vision/History: No visual deficits Patient Visual Report: No change from baseline Vision Assessment?: Yes Eye Alignment: Within Functional Limits Ocular Range of Motion: Within Functional  Limits Alignment/Gaze Preference: Within Defined Limits Saccades: Within functional limits Visual Fields: No apparent deficits Additional Comments: able to scan environment quickly and efficiently during path finding exercise      Perception Perception Perception Tested?: Yes   Praxis Praxis Praxis tested?: Within functional limits    Pertinent Vitals/Pain Pain Assessment: No/denies pain     Hand Dominance Right   Extremity/Trunk Assessment Upper Extremity Assessment Upper Extremity Assessment: Overall WFL for tasks assessed   Lower Extremity Assessment Lower Extremity Assessment: Overall WFL for tasks assessed   Cervical / Trunk Assessment Cervical / Trunk Assessment: Normal   Communication Communication Communication: Expressive difficulties   Cognition Arousal/Alertness: Awake/alert Behavior During Therapy: WFL for tasks assessed/performed Overall Cognitive Status: Within Functional Limits for tasks assessed                                 General Comments: Pt able to follow a 4 step command, locate signage in hallway to perform path finding activity.  He was able to read signage in hallways quickly while ambulating.  He demonstrated good problem solving when he couldn't locate appropriate sign, and was able to  find his way back to his room independently    General Comments  VSS     Exercises     Shoulder Instructions      Home Living Family/patient expects to be discharged to:: Private residence Living Arrangements: Spouse/significant other(David [name]) Available Help at Discharge: Family Type of Home: House Home Access: Stairs to enter Secretary/administrator of Steps: 2 Entrance Stairs-Rails: Can reach both Home Layout: One level     Bathroom Shower/Tub: Chief Strategy Officer: Standard     Home Equipment: Crutches   Additional 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         OT Problem List: Decreased strength      OT Treatment/Interventions:      OT Goals(Current goals can be found in the care plan section) Acute Rehab OT Goals Patient Stated Goal: Pt didn't state  OT Goal Formulation: All assessment and education complete, DC therapy  OT Frequency:     Barriers to D/C:            Co-evaluation              AM-PAC OT "6 Clicks" Daily Activity     Outcome Measure Help from another person eating meals?: None Help from another person taking care of personal grooming?: None Help from another person toileting, which includes using toliet, bedpan, or urinal?: None Help from another person bathing (including washing, rinsing, drying)?: None Help from another person to put on and taking off regular upper body clothing?: None Help from another person to put on and taking off regular lower body clothing?: None 6 Click Score: 24   End of Session    Activity Tolerance: Patient tolerated treatment well Patient left: in bed;with call bell/phone within reach;with family/visitor present  OT Visit Diagnosis: Cognitive communication deficit (R41.841) Symptoms and signs involving cognitive functions: Cerebral infarction                Time: 7322-0254 OT Time Calculation (min): 27 min Charges:  OT General Charges $OT Visit: 1 Visit OT Evaluation $OT Eval Low Complexity: 1 Low OT Treatments $Therapeutic Activity: 8-22 mins  Eber Jones., OTR/L Acute Rehabilitation Services Pager [phone] Office [phone]   Jeani Hawking M 08/10/2019, 4:48 PM

## 2019-08-11 ENCOUNTER — Encounter (HOSPITAL_COMMUNITY): Payer: Self-pay | Admitting: Internal Medicine

## 2019-08-11 DIAGNOSIS — I420 Dilated cardiomyopathy: Secondary | ICD-10-CM

## 2019-08-11 DIAGNOSIS — G9389 Other specified disorders of brain: Secondary | ICD-10-CM

## 2019-08-11 DIAGNOSIS — I5022 Chronic systolic (congestive) heart failure: Secondary | ICD-10-CM

## 2019-08-11 LAB — TROPONIN I (HIGH SENSITIVITY): Troponin I (High Sensitivity): 7 ng/L (ref <18)

## 2019-08-11 LAB — CBC
HCT: 45.5 % (ref 39.0–52.0)
Hemoglobin: 15.3 g/dL (ref 13.0–17.0)
MCH: 31.3 pg (ref 26.0–34.0)
MCHC: 33.6 g/dL (ref 30.0–36.0)
MCV: 93 fL (ref 80.0–100.0)
Platelets: 210 10*3/uL (ref 150–400)
RBC: 4.89 MIL/uL (ref 4.22–5.81)
RDW: 12 % (ref 11.5–15.5)
WBC: 5.6 10*3/uL (ref 4.0–10.5)
nRBC: 0 % (ref 0.0–0.2)

## 2019-08-11 LAB — BASIC METABOLIC PANEL
Anion gap: 8 (ref 5–15)
BUN: 9 mg/dL (ref 6–20)
CO2: 25 mmol/L (ref 22–32)
Calcium: 8.9 mg/dL (ref 8.9–10.3)
Chloride: 105 mmol/L (ref 98–111)
Creatinine, Ser: 1.17 mg/dL (ref 0.61–1.24)
GFR calc Af Amer: >60 mL/min (ref >60)
GFR calc non Af Amer: >60 mL/min (ref >60)
Glucose, Bld: 107 mg/dL — ABNORMAL HIGH (ref 70–99)
Potassium: 3.6 mmol/L (ref 3.5–5.1)
Sodium: 138 mmol/L (ref 135–145)

## 2019-08-11 MED ORDER — LOSARTAN POTASSIUM 25 MG PO TABS
25.0000 mg | ORAL_TABLET | Freq: Every day | ORAL | Status: DC
  Administered 2019-08-11 – 2019-08-14 (×4): 25 mg via ORAL
  Filled 2019-08-11 (×4): qty 1

## 2019-08-11 MED ORDER — ATORVASTATIN CALCIUM 80 MG PO TABS
80.0000 mg | ORAL_TABLET | Freq: Every day | ORAL | Status: DC
  Administered 2019-08-11 – 2019-08-13 (×3): 80 mg via ORAL
  Filled 2019-08-11 (×3): qty 1

## 2019-08-11 MED ORDER — CARVEDILOL 3.125 MG PO TABS
3.1250 mg | ORAL_TABLET | Freq: Two times a day (BID) | ORAL | Status: DC
  Administered 2019-08-11 – 2019-08-14 (×6): 3.125 mg via ORAL
  Filled 2019-08-11 (×6): qty 1

## 2019-08-11 NOTE — Progress Notes (Addendum)
PROGRESS NOTE    David [name]  EGB:[id number] DOB: 1966/01/22 DOA: 08/09/2019 PCP: Patient, No Pcp Per     Brief Narrative:  54 year old man admitted from home on 3/5 with complaints of a speech abnormality and a facial droop.  His past medical history significant for tobacco abuse.  He has significant expressive aphasia.  He was admitted as a stroke outside of window for TPA and admission was requested for further evaluation and management.   Assessment & Plan:   Active Problems:   Acute ischemic stroke (HCC)   Tobacco abuse   Chronic systolic CHF (congestive heart failure) (HCC)   Mass of left temporal lobe   Acute ischemic CVA -CT head showed an acute/early subacute ischemic infarction within the mid left frontal lobe and left frontal operculum without evidence of hemorrhagic conversion. -MRI showed acute branch vessel infarctions in the left MCA territory.  Other punctate foci of acute infarction are visible. -Dopplers pending. -2D ECHO: EF 20-25% with global hypokinesis. Cardiology has been consulted. Plans are for a TEE. -Plan is for dual antiplatelet therapy for 3 weeks and then aspirin alone. -PT/OT/ST recommendations are pending. -LDL is 97, goal less than 70, started on Lipitor 40 mg. -Loop recorder to be arranged for.  Tobacco abuse -Counseled on cessation.  Chronic Systolic CHF -2D ECHO: EF 20-25% with global hypokinesis.  -Started on carvedilol and losartan. -Plans for ischemic work up as an OP once he has recovered from his acute CVA. -Appreciate cardiology input and recommendations.  Left Temporal Mass -Noted on CT scan. -Per patient and fiancee he suffered trauma to his ear years ago and recently had reconstructive surgery.   DVT prophylaxis: Lovenox Code Status: Full code Family Communication: Fianc at bedside updated on plan of care and all questions answered on 3/6. Disposition Plan: Home pending completed work up for stroke and new diagnosed  systolic heart failure. Anticipate 24-28 hours.  Consultants:   Neurology  Procedures:   None  Antimicrobials:  Anti-infectives (From admission, onward)   None       Subjective: In bed, awake, no chest pain, no SOB.  Objective: Vitals:   08/11/19 0000 08/11/19 0433 08/11/19 0814 08/11/19 1155  BP: 120/65 140/77 (!) 145/83 135/87  Pulse: (!) 53 69 74 68  Resp: 19 18 19 18   Temp: 98 F (36.7 C) 98.6 F (37 C) 98.2 F (36.8 C) 98.2 F (36.8 C)  TempSrc: Oral Oral Axillary Oral  SpO2: 98% 97% 94% 97%   No intake or output data in the 24 hours ending 08/11/19 1420 There were no vitals filed for this visit.  Examination:  General exam: Alert, awake, oriented x 3, some expressive aphasia noted Respiratory system: Clear to auscultation. Respiratory effort normal. Cardiovascular system:RRR. No murmurs, rubs, gallops. Gastrointestinal system: Abdomen is nondistended, soft and nontender. No organomegaly or masses felt. Normal bowel sounds heard. Central nervous system: Alert and oriented. No focal neurological deficits. Extremities: No C/C/E, +pedal pulses Skin: No rashes, lesions or ulcers Psychiatry: Judgement and insight appear normal. Mood & affect appropriate.     Data Reviewed: I have personally reviewed following labs and imaging studies  CBC: Recent Labs  Lab 08/09/19 1737 08/09/19 1738 08/11/19 0243  WBC 7.1  --  5.6  NEUTROABS 4.3  --   --   HGB 14.6 14.6 15.3  HCT 42.7 43.0 45.5  MCV 94.1  --  93.0  PLT 213  --  371   Basic Metabolic Panel: Recent Labs  Lab  08/09/19 1737 08/09/19 1738 08/11/19 0243  NA 139 141 138  K 3.6 3.5 3.6  CL 104 103 105  CO2 26  --  25  GLUCOSE 122* 118* 107*  BUN CREATININE 1.19 1.10 1.17  CALCIUM 9.0  --  8.9   GFR: CrCl cannot be calculated (Unknown ideal weight.). Liver Function Tests: Recent Labs  Lab 08/09/19 1737  AST 23  ALT 13  ALKPHOS 84  BILITOT 0.9  PROT 7.3  ALBUMIN 4.0   No  results for input(s): LIPASE, AMYLASE in the last 168 hours. No results for input(s): AMMONIA in the last 168 hours. Coagulation Profile: Recent Labs  Lab 08/09/19 1812  INR 1.1   Cardiac Enzymes: No results for input(s): CKTOTAL, CKMB, CKMBINDEX, TROPONINI in the last 168 hours. BNP (last 3 results) No results for input(s): PROBNP in the last 8760 hours. HbA1C: Recent Labs    08/10/19 0439  HGBA1C 5.1   CBG: No results for input(s): GLUCAP in the last 168 hours. Lipid Profile: Recent Labs    08/10/19 0439  CHOL 149  HDL 34*  LDLCALC 97  TRIG 91  CHOLHDL 4.4   Thyroid Function Tests: No results for input(s): TSH, T4TOTAL, FREET4, T3FREE, THYROIDAB in the last 72 hours. Anemia Panel: No results for input(s): VITAMINB12, FOLATE, FERRITIN, TIBC, IRON, RETICCTPCT in the last 72 hours. Urine analysis:    Component Value Date/Time   COLORURINE STRAW (A) 08/09/2019 2117   APPEARANCEUR CLEAR 08/09/2019 2117   LABSPEC 1.034 (H) 08/09/2019 2117   PHURINE 6.0 08/09/2019 2117   GLUCOSEU NEGATIVE 08/09/2019 2117   HGBUR NEGATIVE 08/09/2019 2117   BILIRUBINUR NEGATIVE 08/09/2019 2117   KETONESUR NEGATIVE 08/09/2019 2117   PROTEINUR NEGATIVE 08/09/2019 2117   NITRITE NEGATIVE 08/09/2019 2117   LEUKOCYTESUR TRACE (A) 08/09/2019 2117   Sepsis Labs: (procalcitonin:4,lacticidven:4)  ) Recent Results (from the past 240 hour(s))  SARS CORONAVIRUS 2 (TAT 6-24 HRS) Nasopharyngeal     Status: None   Collection Time: 08/09/19  9:09 PM   Specimen: Nasopharyngeal  Result Value Ref Range Status   SARS Coronavirus 2 NEGATIVE NEGATIVE Final    Comment: (NOTE) SARS-CoV-2 target nucleic acids are NOT DETECTED. The SARS-CoV-2 RNA is generally detectable in upper and lower respiratory specimens during the acute phase of infection. Negative results do not preclude SARS-CoV-2 infection, do not rule out co-infections with other pathogens, and should not be used as the sole basis  for treatment or other patient management decisions. Negative results must be combined with clinical observations, patient history, and epidemiological information. The expected result is Negative. Fact Sheet for Patients: HairSlick.no Fact Sheet for Healthcare Providers: quierodirigir.com This test is not yet approved or cleared by the [place] FDA and  has been authorized for detection and/or diagnosis of SARS-CoV-2 by FDA under an Emergency Use Authorization (EUA). This EUA will remain  in effect (meaning this test can be used) for the duration of the COVID-19 declaration under Section 56 4(b)(1) of the Act, 21 U.S.C. section 360bbb-3(b)(1), unless the authorization is terminated or revoked sooner. Performed at [facility], [address], [place], [place] [address]   Culture, blood (Routine X 2) w Reflex to ID Panel     Status: None (Preliminary result)   Collection Time: 08/10/19  9:55 AM   Specimen: BLOOD LEFT ARM  Result Value Ref Range Status   Specimen Description BLOOD LEFT ARM  Final   Special Requests   Final  BOTTLES DRAWN AEROBIC AND ANAEROBIC Blood Culture adequate volume   Culture   Final    NO GROWTH < 24 HOURS Performed at [facility], [address] [place], [place] [address]    Report Status PENDING  Incomplete  Culture, blood (Routine X 2) w Reflex to ID Panel     Status: None (Preliminary result)   Collection Time: 08/10/19 10:03 AM   Specimen: BLOOD RIGHT ARM  Result Value Ref Range Status   Specimen Description BLOOD RIGHT ARM  Final   Special Requests   Final    BOTTLES DRAWN AEROBIC AND ANAEROBIC Blood Culture adequate volume   Culture   Final    NO GROWTH < 24 HOURS Performed at [facility], [address], [place], [place] [address]    Report Status PENDING  Incomplete         Radiology Studies: CT Code Stroke CTA Head W/WO contrast  Addendum Date: 08/09/2019    ADDENDUM REPORT: 08/09/2019 18:31 ADDENDUM: Please note there is also a moderate to severe stenosis within the A1 right anterior cerebral artery (series 8, image 21) (series 9, image 17). These results were called by telephone at the time of interpretation on 08/09/2019 at 6:31 pm to provider Dr. Laurence Slate, who verbally acknowledged these results. Electronically Signed   By: Jackey Loge DO   On: 08/09/2019 18:31   Result Date: 08/09/2019 CLINICAL DATA:  Stroke code. EXAM: CT ANGIOGRAPHY HEAD TECHNIQUE: Multidetector CT imaging of the head was performed using the standard protocol during bolus administration of intravenous contrast. Multiplanar CT image reconstructions and MIPs were obtained to evaluate the vascular anatomy. CONTRAST:  52mL OMNIPAQUE IOHEXOL 350 MG/ML SOLN COMPARISON:  Noncontrast head CT performed earlier the same day. FINDINGS: Please refer to full dictation of the CTA head/neck and CT perfusion under the CT angiogram neck examination. IMPRESSION: Please refer to full dictation of the CTA head/neck and CT perfusion under the CT angiogram neck examination. Electronically Signed: By: Jackey Loge DO On: 08/09/2019 18:18   CT Code Stroke CTA Neck W/WO contrast  Addendum Date: 08/09/2019   ADDENDUM REPORT: 08/09/2019 22:14 ADDENDUM: Please note there is also a moderate to severe stenosis within the A1 right anterior cerebral artery (series 8, image 21) (series 9, image 17). These results were called by telephone at the time of interpretation on 08/09/2019 at 6:31 pm to provider Dr. Laurence Slate, who verbally acknowledged these results. Electronically Signed   By: Jackey Loge DO   On: 08/09/2019 22:14   Result Date: 08/09/2019 CLINICAL DATA:  Stroke code. Focal neuro deficit, greater than 6 hours, stroke suspected. EXAM: CT ANGIOGRAPHY HEAD AND NECK CT PERFUSION BRAIN TECHNIQUE: Multidetector CT imaging of the head and neck was performed using the standard protocol during bolus administration of intravenous  contrast. Multiplanar CT image reconstructions and MIPs were obtained to evaluate the vascular anatomy. Carotid stenosis measurements (when applicable) are obtained utilizing NASCET criteria, using the distal internal carotid diameter as the denominator. Multiphase CT imaging of the brain was performed following IV bolus contrast injection. Subsequent parametric perfusion maps were calculated using RAPID software. CONTRAST:  Administered contrast not known at this time COMPARISON:  Noncontrast head CT performed earlier the same day. FINDINGS: CTA NECK FINDINGS Aortic arch: Standard aortic branching. No significant innominate or proximal subclavian artery stenosis Right carotid system: CCA and ICA patent within the neck without stenosis. Mild mixed plaque within the carotid bulb. Left carotid system: CCA and ICA patent  within the neck without stenosis. Vertebral arteries: Codominant. Patent within the neck without stenosis Skeleton: No acute bony abnormality. Cervical spondylosis with multilevel posterior disc osteophytes, uncovertebral and facet hypertrophy. Other neck: No neck mass or cervical lymphadenopathy. Upper chest: No consolidation within the imaged lung apices. Bullous emphysema. Review of the MIP images confirms the above findings CTA HEAD FINDINGS Anterior circulation: The intracranial internal carotid arteries are patent without high-grade stenosis. 7 mm annular is arising from the cavernous left ICA (series 5, image 124). The M1 right middle cerebral artery is patent without significant stenosis. No right M2 proximal branch occlusion or high-grade proximal stenosis is identified. The M1 left middle cerebral artery is patent without significant stenosis. No definite M2 proximal branch occlusion is identified. There is a high-grade focal stenosis within a proximal inferior division left M2 branch (series 10, image 12). There is also a high-grade stenosis within a mid to distal superior division M2 branch  (series 10, image 12). The anterior cerebral arteries are patent without high-grade proximal stenosis. Posterior circulation: The intracranial right vertebral artery is dominant and patent without significant stenosis. The intracranial left vertebral artery is developmentally diminutive and markedly irregular, but patent. The basilar artery is patent without significant stenosis. The bilateral posterior cerebral arteries are patent without significant proximal stenosis. Posterior communicating arteries are poorly delineated and may be hypoplastic or absent bilaterally. Venous sinuses: Within limitations of contrast timing, no convincing thrombus. Anatomic variants: As described Review of the MIP images confirms the above findings CT Brain Perfusion Findings: ASPECTS: 9 CBF (<30%) Volume: 13mL Perfusion (Tmax>6.0s) volume: 79mL Mismatch Volume: 78mL IMPRESSION: CTA neck: 1. The bilateral common and internal carotid arteries are patent within the neck without significant stenosis. 2. The vertebral arteries are patent within the neck without stenosis. CTA head: 1. No intracranial large vessel occlusion. No definite proximal M2 left MCA branch occlusion is identified. 2. There is a high-grade stenosis within an inferior division proximal left MCA M2 branch. Additional high-grade stenosis within the mid to distal superior division left MCA M2 branch. 3. 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 CT perfusion head: The perfusion software identifies no core infarct. The perfusion software identifies no critically hypoperfused parenchyma utilizing a Tmax>6 seconds threshold. However, there were clear changes of ischemic infarction within the mid left frontal lobe and left frontal operculum on noncontrast head CT performed immediately prior. Of note, there is a 9 mL region of hypoperfused parenchyma in this region when utilizing a Tmax > 4 seconds threshold. Electronically Signed: By: Jackey Loge DO On: 08/09/2019  18:12   MR BRAIN WO CONTRAST  Result Date: 08/10/2019 CLINICAL DATA:  Right facial droop.  Follow-up acute stroke. EXAM: MRI HEAD WITHOUT CONTRAST TECHNIQUE: Multiplanar, multiecho pulse sequences of the brain and surrounding structures were obtained without intravenous contrast. COMPARISON:  CT studies 08/09/2019 FINDINGS: Brain: No abnormality seen affecting the brainstem, cerebellum or right cerebral hemisphere. In the left cerebral hemisphere, there is a confluent acute infarction measuring 3 x 3 x 1.5 cm within the left frontal operculum. Few other punctate foci of acute infarction posterior to that in the left posterior frontal and frontoparietal junction regions. No evidence of mass effect or hemorrhage. Findings are consistent with left MCA territory embolic infarctions. Vascular: Major vessels at the base of the brain show flow. Skull and upper cervical spine: Negative Sinuses/Orbits: Inflammatory rhinosinusitis, most pronounced affecting the left maxillary sinus. Other: None IMPRESSION: Acute branch vessel infarctions in the left MCA territory.  Largest infarction is a 3 x 3 x 1.5 cm left frontal operculum cortical and subcortical infarction. Few other punctate foci of acute infarction posterior to that at the left posterior frontal vertex and frontoparietal vertex. No mass effect. No blood products. Remainder of the brain is normal. Electronically Signed   By: Paulina Fusi M.D.   On: 08/10/2019 02:55   CT Code Stroke Cerebral Perfusion with contrast  Result Date: 08/09/2019 CLINICAL DATA:  Stroke code. EXAM: CT PERFUSION BRAIN TECHNIQUE: Multiphase CT imaging of the brain was performed following IV bolus contrast injection. Subsequent parametric perfusion maps were calculated using RAPID software. CONTRAST:  41mL OMNIPAQUE IOHEXOL 350 MG/ML SOLN COMPARISON:  Noncontrast head CT performed earlier the same day. FINDINGS: Please refer to complete dictation for the CTA head/neck and CT perfusion under  the CTA neck dictation. IMPRESSION: Please refer to complete dictation for the CTA head/neck and CT perfusion under the CTA neck dictation. Electronically Signed   By: Jackey Loge DO   On: 08/09/2019 18:19   ECHOCARDIOGRAM COMPLETE  Result Date: 08/10/2019    ECHOCARDIOGRAM REPORT   Patient Name:   David [name] [facility] Date of Exam: 08/10/2019 Medical Rec #:  [id number]       Height:       71.0 in Accession #:    [id number]      Weight:       165.0 lb Date of Birth:  04-12-1966       BSA:          1.943 m Patient Age:    53 years        BP:           146/85 mmHg Patient Gender: M               HR:           61 bpm. Exam Location:  Inpatient Procedure: 2D Echo Indications:    Stroke 434.91 / I163.9  History:        Patient has no prior history of Echocardiogram examinations.                 Risk Factors:Tobacco abuse.  Sonographer:    Leeroy Bock Turrentine Referring Phys: [id number] VISHAL R PATEL IMPRESSIONS  1. Left ventricular ejection fraction, by estimation, is 20 to 25%. The left ventricle has severely decreased function. The left ventricle demonstrates global hypokinesis. The left ventricular internal cavity size was moderately dilated. Left ventricular diastolic parameters are consistent with Grade II diastolic dysfunction (pseudonormalization). Elevated left atrial pressure.  2. Right ventricular systolic function is normal. The right ventricular size is normal. There is mildly elevated pulmonary artery systolic pressure. The estimated right ventricular systolic pressure is 30.2 mmHg.  3. Left atrial size was mildly dilated.  4. The mitral valve is normal in structure and function. Moderate mitral valve regurgitation.  5. The aortic valve is normal in structure and function. Aortic valve regurgitation is not visualized. Comparison(s): No prior Echocardiogram. Conclusion(s)/Recommendation(s): No intracardiac source of embolism detected on this transthoracic study. Specifically, no left ventricular thrombus is seen, but  the left ventricular syst olic function is severely decreased, thereby increasing the risk of intracardiac thrombus. FINDINGS  Left Ventricle: Left ventricular ejection fraction, by estimation, is 20 to 25%. The left ventricle has severely decreased function. The left ventricle demonstrates global hypokinesis. The left ventricular internal cavity size was moderately dilated. There is no left ventricular hypertrophy. Left ventricular diastolic parameters are consistent with Grade II diastolic  dysfunction (pseudonormalization). Elevated left atrial pressure. Right Ventricle: The right ventricular size is normal. No increase in right ventricular wall thickness. Right ventricular systolic function is normal. There is mildly elevated pulmonary artery systolic pressure. The tricuspid regurgitant velocity is 2.61  m/s, and with an assumed right atrial pressure of 3 mmHg, the estimated right ventricular systolic pressure is 30.2 mmHg. Left Atrium: Left atrial size was mildly dilated. Right Atrium: Right atrial size was normal in size. Pericardium: There is no evidence of pericardial effusion. Mitral Valve: The mitral valve is normal in structure and function. Moderate mitral valve regurgitation, with centrally-directed jet. Tricuspid Valve: The tricuspid valve is normal in structure. Tricuspid valve regurgitation is trivial. Aortic Valve: The aortic valve is normal in structure and function. Aortic valve regurgitation is not visualized. Pulmonic Valve: The pulmonic valve was normal in structure. Pulmonic valve regurgitation is not visualized. Aorta: The aortic root and ascending aorta are structurally normal, with no evidence of dilitation. IAS/Shunts: No atrial level shunt detected by color flow Doppler.  LEFT VENTRICLE PLAX 2D LVIDd:         6.87 cm      Diastology LVIDs:         6.09 cm      LV e' lateral:   4.62 cm/s LV PW:         0.96 cm      LV E/e' lateral: 14.6 LV IVS:        0.98 cm      LV e' medial:    5.11 cm/s  LVOT diam:     1.90 cm      LV E/e' medial:  13.2 LV SV:         36 LV SV Index:   19 LVOT Area:     2.84 cm  LV Volumes (MOD) LV vol d, MOD A2C: 139.0 ml LV vol d, MOD A4C: 248.0 ml LV vol s, MOD A2C: 94.5 ml LV vol s, MOD A4C: 186.0 ml LV SV MOD A2C:     44.5 ml LV SV MOD A4C:     248.0 ml LV SV MOD BP:      57.3 ml RIGHT VENTRICLE RV S prime:     15.20 cm/s TAPSE (M-mode): 2.2 cm LEFT ATRIUM             Index       RIGHT ATRIUM           Index LA diam:        4.20 cm 2.16 cm/m  RA Area:     16.30 cm LA Vol (A2C):   43.6 ml 22.44 ml/m RA Volume:   40.90 ml  21.05 ml/m LA Vol (A4C):   74.7 ml 38.45 ml/m LA Biplane Vol: 59.7 ml 30.73 ml/m  AORTIC VALVE LVOT Vmax:   76.40 cm/s LVOT Vmean:  54.400 cm/s LVOT VTI:    0.128 m  AORTA Ao Root diam: 3.10 cm MITRAL VALVE               TRICUSPID VALVE MV Area (PHT): 3.31 cm    TR Peak grad:   27.2 mmHg MV Decel Time: 229 msec    TR Vmax:        261.00 cm/s MV E velocity: 67.30 cm/s MV A velocity: 57.80 cm/s  SHUNTS MV E/A ratio:  1.16        Systemic VTI:  0.13 m  Systemic Diam: 1.90 cm Thurmon Fair MD Electronically signed by Thurmon Fair MD Signature Date/Time: 08/10/2019/3:56:16 PM    Final    CT HEAD CODE STROKE WO CONTRAST  Addendum Date: 08/09/2019   ADDENDUM REPORT: 08/09/2019 22:25 ADDENDUM: Findings omitted from the impression. 7.0 x 2.3 cm cutaneous/subcutaneous soft tissue mass along the lateral aspect of the left temporal bone, involving the left external ear, highly suspicious for malignancy. This finding was discussed with Dr. Laurence Slate at the time of the original dictation at 5:54 p.m. on 08/09/19. Electronically Signed   By: Jackey Loge DO   On: 08/09/2019 22:25   Addendum Date: 08/09/2019   ADDENDUM REPORT: 08/09/2019 17:57 ADDENDUM: These results were called by telephone at the time of interpretation on 08/09/2019 at 5:54 pm to provider Dr. Laurence Slate, who verbally acknowledged these results. Electronically Signed   By: Jackey Loge DO   On: 08/09/2019 17:57   Result Date: 08/09/2019 CLINICAL DATA:  Code stroke.  Aphasia EXAM: CT HEAD WITHOUT CONTRAST TECHNIQUE: Contiguous axial images were obtained from the base of the skull through the vertex without intravenous contrast. COMPARISON:  No pertinent prior studies available for comparison. FINDINGS: Brain: There is no evidence of acute intracranial hemorrhage. There is abnormal hypodensity consistent with acute/early subacute infarction involving the mid left frontal lobe and left frontal operculum. There is no significant mass effect. No evidence of intracranial mass. No midline shift or extra-axial fluid collection. Vascular: No hyperdense vessel is identified. Skull: Normal. Negative for fracture or focal lesion. Sinuses/Orbits: Visualized orbits demonstrate no acute abnormality. Paranasal sinus disease. Most notably, there is near complete opacification left maxillary sinus. Right maxillary sinus mucous retention cyst. Scattered opacification of bilateral ethmoid air cells. No significant mastoid effusion. Other: There is a cutaneous/subcutaneous mass along the lateral aspect of the left temporal bone and involving the external ear which measures 7.0 x 2.3 cm in transaxial dimensions, likely reflecting malignancy (series 3, image 9) ASPECTS ([place] Stroke Program Early CT Score) - Ganglionic level infarction (caudate, lentiform nuclei, internal capsule, insula, M1-M3 cortex): Seven - Supraganglionic infarction (M4-M6 cortex): 2 (point adducted for involvement of the left M5 territory) Total score (0-10 with 10 being normal):  9 IMPRESSION: Acute/early subacute ischemic infarction within the mid left frontal lobe and left frontal operculum. No evidence of hemorrhagic conversion. No significant mass effect. ASPECTS 9 Electronically Signed: By: Jackey Loge DO On: 08/09/2019 17:48        Scheduled Meds: . aspirin EC  81 mg Oral Daily  . atorvastatin  40 mg Oral q1800  .  carvedilol  3.125 mg Oral BID WC  . clopidogrel  75 mg Oral Daily  . enoxaparin (LOVENOX) injection  40 mg Subcutaneous Q24H  . LORazepam  1 mg Intravenous Once  . losartan  25 mg Oral Daily   Continuous Infusions:   LOS: 2 days    Time spent: 35 minutes. Greater than 50% of this time was spent in direct contact with the patient, coordinating care and discussing relevant ongoing clinical issues.     Chaya Jan, MD [facility] Pager [phone]  If 7PM-7AM, please contact night-coverage [email or web address] Password [email or web address] 08/11/2019, 2:20 PM

## 2019-08-11 NOTE — Progress Notes (Signed)
STROKE TEAM PROGRESS NOTE      INTERVAL HISTORY Patient is sitting up in bed.  Is a lady friend/significant other at the bedside.  Patient denies any prior history of cardiac disease, shortness of breath or palpitations.  2D echo shows cardiomyopathy with diminished ejection fraction of 20 to 25%.  Cardiology has been consulted   OBJECTIVE Vitals:   08/10/19 1600 08/10/19 2001 08/11/19 0000 08/11/19 0433  BP: 128/79 (!) 141/88 120/65 140/77  Pulse: 74 66 (!) 53 69  Resp: (!) 23 20 19 18   Temp: 97.7 F (36.5 C) 98.3 F (36.8 C) 98 F (36.7 C) 98.6 F (37 C)  TempSrc: Oral Oral Oral Oral  SpO2: 100% 97% 98% 97%    CBC:  Recent Labs  Lab 08/09/19 1737 08/09/19 1737 08/09/19 1738 08/11/19 0243  WBC 7.1  --   --  5.6  NEUTROABS 4.3  --   --   --   HGB 14.6   < > 14.6 15.3  HCT 42.7   < > 43.0 45.5  MCV 94.1  --   --  93.0  PLT 213  --   --  210   < > = values in this interval not displayed.    Basic Metabolic Panel:  Recent Labs  Lab 08/09/19 1737 08/09/19 1737 08/09/19 1738 08/11/19 0243  NA 139   < > 141 138  K 3.6   < > 3.5 3.6  CL 104   < > 103 105  CO2 26  --   --  25  GLUCOSE 122*   < > 118* 107*  BUN 10   < > 11 9  CREATININE 1.19   < > 1.10 1.17  CALCIUM 9.0  --   --  8.9   < > = values in this interval not displayed.    Lipid Panel:     Component Value Date/Time   CHOL 149 08/10/2019 0439   TRIG 91 08/10/2019 0439   HDL 34 (L) 08/10/2019 0439   CHOLHDL 4.4 08/10/2019 0439   VLDL 18 08/10/2019 0439   LDLCALC 97 08/10/2019 0439   HgbA1c:  Lab Results  Component Value Date   HGBA1C 5.1 08/10/2019   Urine Drug Screen:     Component Value Date/Time   LABOPIA NONE DETECTED 08/09/2019 2118   COCAINSCRNUR NONE DETECTED 08/09/2019 2118   LABBENZ NONE DETECTED 08/09/2019 2118   AMPHETMU NONE DETECTED 08/09/2019 2118   THCU POSITIVE (A) 08/09/2019 2118   LABBARB NONE DETECTED 08/09/2019 2118    Alcohol Level     Component Value Date/Time    ETH <10 08/09/2019 1737    IMAGING  CT Code Stroke CTA Head W/WO contrast CT Code Stroke CTA Neck W/WO contrast CT Code Stroke Cerebral Perfusion with contrast IMPRESSION:   CTA neck:  1. The bilateral common and internal carotid arteries are patent within the neck without significant stenosis.  2. The vertebral arteries are patent within the neck without stenosis.   CTA head:  1. No intracranial large vessel occlusion. No definite proximal M2 left MCA branch occlusion is identified.  2. There is a high-grade stenosis within an inferior division proximal left MCA M2 branch. Additional high-grade stenosis within the mid to distal superior division left MCA M2 branch.  3. 7 mm aneurysm arising from the cavernous left ICA  CT perfusion head:  The perfusion software identifies no core infarct. The perfusion software identifies no critically hypoperfused parenchyma utilizing a Tmax>6 seconds threshold. However,  there were clear changes of ischemic infarction within the mid left frontal lobe and left frontal operculum on noncontrast head CT performed immediately prior. Of note, there is a 9 mL region of hypoperfused parenchyma in this region when utilizing a Tmax > 4 seconds threshold.   MR BRAIN WO CONTRAST 08/10/2019 IMPRESSION:  Acute branch vessel infarctions in the left MCA territory. Largest infarction is a 3 x 3 x 1.5 cm left frontal operculum cortical and subcortical infarction. Few other punctate foci of acute infarction posterior to that at the left posterior frontal vertex and frontoparietal vertex. No mass effect. No blood products. Remainder of the brain is normal.   CT HEAD CODE STROKE WO CONTRAST 08/09/2019   ADDENDUM:  Findings omitted from the impression. 7.0 x 2.3 cm cutaneous/subcutaneous soft tissue mass along the lateral aspect of the left temporal bone, involving the left external ear, highly suspicious for malignancy.   08/09/2019 IMPRESSION:  Acute/early subacute ischemic  infarction within the mid left frontal lobe and left frontal operculum. No evidence of hemorrhagic conversion. No significant mass effect. ASPECTS 9       Transthoracic Echocardiogram  Diminished ejection fraction 20 to 25%.  Global hypokinesis.    Bilateral Carotid Dopplers  00/00/2021 Pending  Transcranial Doppler pending ECG - SR rate 80 BPM. (See cardiology reading for complete details)   PHYSICAL EXAM Blood pressure 140/77, pulse 69, temperature 98.6 F (37 C), temperature source Oral, resp. rate 18, SpO2 97 %. Pleasant middle-aged African-American male not in distress. . Afebrile. Head is nontraumatic. Neck is supple without bruit.    Cardiac exam no murmur or gallop. Lungs are clear to auscultation. Distal pulses are well felt. Neurological Exam ;  Awake  Alert oriented x 3. Normal speech and language except mild word finding difficulty but no dysarthria.[place] movements full without nystagmus.fundi were not visualized. Vision acuity and fields appear normal. Hearing is normal. Palatal movements are normal. Face symmetric. Tongue midline. Normal strength, tone, reflexes and coordination. Normal sensation. Gait deferred.      ASSESSMENT/PLAN David [name] is a 54 y.o. male with history of tobacco and marijuana use presenting with slurred speech and  facial droop . He did not receive IV t-PA due to late presentation (>4.5 hours from time of onset)  Stroke - left MCA infarcts -likely cryptogenic high-grade stenosis within the left MCA M2 branch likely represents subtotally resolved embolus likely from new onset cardiomyopathy  Resultant mild speech difficulty  Code Stroke CT Head - Acute/early subacute ischemic infarction within the mid left frontal lobe and left frontal operculum. No evidence of hemorrhagic conversion. No significant mass effect. ASPECTS 9   CT head - not ordered  MRI head - Acute branch vessel infarctions in the left MCA territory. Largest  infarction is a 3 x 3 x 1.5 cm left frontal operculum cortical and subcortical infarction. Few other punctate foci of acute infarction posterior to that at the left posterior frontal vertex and frontoparietal vertex.  MRA head - not ordered  CTA H&N - There is a high-grade stenosis within an inferior division proximal left MCA M2 branch. Additional high-grade stenosis within the mid to distal superior division left MCA M2 branch.   CT Perfusion - The perfusion software identifies no core infarct.  Carotid Doppler - CTA neck performed - carotid dopplers not indicated.  2D Echo -diminished ejection fraction 20 to 25%.  Sars Corona Virus 2 - negative  LDL - 97  HgbA1c - 5.1  UDS -  positive for THCU  VTE prophylaxis - Lovenox Diet  Diet Order            Diet - low sodium heart healthy        Diet Heart Room service appropriate? Yes; Fluid consistency: Thin  Diet effective now              No antithrombotic prior to admission, now on aspirin 81 mg daily and clopidogrel 75 mg daily  Patient counseled to be compliant with his antithrombotic medications  Ongoing aggressive stroke risk factor management  Therapy recommendations:  pending  Disposition:  Pending  Hypertension  Home BP meds: none   Current BP meds: none   Stable . Permissive hypertension (OK if < 220/120) but gradually normalize in 5-7 days  . Long-term BP goal normotensive  Hyperlipidemia  Home Lipid lowering medication: none   LDL 97, goal < 70  Current lipid lowering medication: Lipitor 40 mg daily   Continue statin at discharge  Other Stroke Risk Factors  Cigarette smoker - advised to stop smoking  Family hx stroke - no history on file  Substance Abuse - marijuana  Other Active Problems  Code status - Full Code  7 mm aneurysm arising from the cavernous left ICA  7.0 x 2.3 cm cutaneous/subcutaneous soft tissue mass along the lateral aspect of the left temporal bone, involving the left  external ear, highly suspicious for malignancy.     Hospital day # 2 He presented with speech difficulties and right facial droop secondary to left MCA branch infarct of cryptogenic etiology.  Recommend aspirin Plavix followed by aspirin alone.  Agree with cardiology consult and check loop recorder and TEE    Patient counseled to quit smoking cigarettes and marijuana and is agreeable.  Continue ongoing stroke work-up.  Aggressive risk factor modification.  Discussed with Dr. Ardyth Harps.  Greater than 50% time during this 25-minute visit was spent on counseling and coordination of care about his stroke and answering questions. Delia Heady, MD To contact Stroke Continuity provider, please refer to [email or web address]. After hours, contact General Neurology

## 2019-08-11 NOTE — Progress Notes (Signed)
    [facility] has been requested to perform a transesophageal echocardiogram on Damier J Lawrance for embolic stroke.  After careful review of history and examination, the risks and benefits of transesophageal echocardiogram have been explained including risks of esophageal damage, perforation (1:10,000 risk), bleeding, pharyngeal hematoma as well as other potential complications associated with conscious sedation including aspiration, arrhythmia, respiratory failure and death. Alternatives to treatment were discussed, questions were answered. Patient is willing to proceed.   Nicolasa Ducking, NP  08/11/2019 12:44 PM

## 2019-08-11 NOTE — Consult Note (Addendum)
Cardiology Consult    Patient ID: JUDAH CARCHI MRN: [id number], DOB/AGE: 02/20/1966   Admit date: 08/09/2019 Date of Consult: 08/11/2019  Primary Physician: Patient, No Pcp Per Primary Cardiologist: Olga Millers, MD Requesting Provider: Jeanella Flattery, MD  Patient Profile    David [name] is a 54 y.o. male with a history of tobacco abuse and probable untreated hypertension, who is being seen today for the evaluation of newly diagnosed cardiomyopathy and chest pain at the request of Dr. Philip Aspen.  Past Medical History   Past Medical History:  Diagnosis Date  . Acute ischemic left MCA stroke (HCC)    a. 08/2019 MRI: Acute L MCA branch vessel infarctions; b. 08/2019 CTA head/neck: Patent bilat carotids/vertebrals. High grade stenosis - inf division of prox L MCA M2 branch. additional high-grade mid-dist superior L MCA M2 branch occlusion. 7mm aneurysm arising from cavernous L ICA.  [name] Cardiomyopathy (HCC)    a. 08/2019 Echo: EF 20-25%, global HK. Gr2 DD. Nl RV fxn. RVSP 30.67mmHg.  Mildly dil LA. Mod MR.  . Cerebral aneurysm    a. 08/2019 CTA Head: 7mm aneurysm arising from the cavernous L ICA.  [name] Facial mass    a. CT head: 7.0 x 2.3 cm soft tissue mass along lat aspect of L temporal bone, involving L ext ear - highly suscpicious for malignancy.  . Moderate mitral regurgitation   . Tobacco abuse     Past Surgical History:  Procedure Laterality Date  . EXTERNAL EAR SURGERY       Allergies  No Known Allergies  History of Present Illness    54 year old male with a history of tobacco abuse, smoking a half a pack a day for most of his adult life.  He also smokes marijuana on the weekends.  He has not seen a healthcare provider regularly in many years.  He lives locally with his fiance.  He works 68 hours a week and has done so for about 20 years.  His work is somewhat physical and he does not generally experience any limitations in his activity.  Approximately 1 week  ago, he had an episode of chest discomfort lasting about 15 minutes and resolving spontaneously.  He mentioned this to his fiance but did not seek medical care.  On March 5, when he returned home from work, he was noted to have a right facial droop and slurred speech.  He also noted difficulty manipulating objects with his right hand.  EMS was called and he was taken to the [facility], ED where code stroke was activated.  CT of the head was performed and was negative for hemorrhage though acute/subacute infarction in the mid left frontal lobe and left frontal operculum were noted.  He was mildly hypertensive on arrival.  He was also incidentally noted to have a 7 x 2.3 cm soft tissue mass along the lateral aspect of his left temporal bone involving the left external ear, which is highly suspicious for malignancy.  He was admitted and subsequently underwent CTA of the head and neck which showed high-grade stenosis in the inferior division of the proximal left MCA M2 branch and high-grade stenosis in the mid to distal superior left MCA M2 branch.  He was also noted to have a 7 mm aneurysm arising from the cavernous left ICA.  MRI showed acute left MCA branch vessel infarctions consistent with embolic stroke.  Echocardiogram was performed March 6 and has shown an EF of 20 to 25% with  global hypokinesis and grade 2 diastolic dysfunction.  Moderate mitral vegetation was also noted.  In the setting of newly diagnosed cardiomyopathy, we have been consulted.  Patient denies any prior history of dyspnea on exertion, PND, orthopnea, edema, or early satiety.  He has no family history of premature coronary artery disease or heart failure.  Inpatient Medications    . aspirin EC  81 mg Oral Daily  . atorvastatin  40 mg Oral q1800  . carvedilol  3.125 mg Oral BID WC  . clopidogrel  75 mg Oral Daily  . enoxaparin (LOVENOX) injection  40 mg Subcutaneous Q24H  . LORazepam  1 mg Intravenous Once  . losartan  25 mg Oral  Daily    Family History    Family History  Problem Relation Age of Onset  . Cancer Mother        died @ 25  . Other Father        died of CO poisoning as a young man.  . Stroke Sister    He indicated that his mother is deceased. He indicated that his father is deceased. He indicated that his sister is alive.   Social History    Social History   Socioeconomic History  . Marital status: Single    Spouse name: Not on file  . Number of children: Not on file  . Years of education: Not on file  . Highest education level: Not on file  Occupational History  . Not on file  Tobacco Use  . Smoking status: Current Every Day Smoker    Packs/day: 0.50    Years: 35.00    Pack years: 17.50    Types: Cigarettes  . Smokeless tobacco: Never Used  Substance and Sexual Activity  . Alcohol use: No  . Drug use: Yes    Types: Marijuana    Comment: smokes several blunts on the weekend.  . Sexual activity: Yes  Other Topics Concern  . Not on file  Social History Narrative   Lives in [place] w/ fiancee.  Does not routinely exercise but has very busy work-life, working M-F 12 hrs shifts and another 8 hrs on Saturdays.  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   Social Determinants of Health   Financial Resource Strain:   . Difficulty of Paying Living Expenses: Not on file  Food Insecurity:   . Worried About Programme researcher, broadcasting/film/video in the Last Year: Not on file  . Ran Out of Food in the Last Year: Not on file  Transportation Needs:   . Lack of Transportation (Medical): Not on file  . Lack of Transportation (Non-Medical): Not on file  Physical Activity:   . Days of Exercise per Week: Not on file  . Minutes of Exercise per Session: Not on file  Stress:   . Feeling of Stress : Not on file  Social Connections:   . Frequency of Communication with Friends and Family: Not on file  . Frequency of Social Gatherings with Friends and Family: Not on file  . Attends Religious  Services: Not on file  . Active Member of Clubs or Organizations: Not on file  . Attends Banker Meetings: Not on file  . Marital Status: Not on file  Intimate Partner Violence:   . Fear of Current or Ex-Partner: Not on file  . Emotionally Abused: Not on file  . Physically Abused: Not on file  . Sexually Abused: Not on file  Review of Systems    General:  No chills, fever, night sweats or weight changes.  Cardiovascular:  +++ episode of chest pain x 15 mins 1 wks ago.  No dyspnea on exertion, edema, orthopnea, palpitations, paroxysmal nocturnal dyspnea. Dermatological: No rash, lesions/masses Respiratory: No cough, dyspnea Urologic: No hematuria, dysuria Abdominal:   No nausea, vomiting, diarrhea, bright red blood per rectum, melena, or hematemesis Neurologic:  No visual changes, +++ RUE wkns, +++ R facial droop, +++ slurred speech prior to admission.  No changes in mental status. All other systems reviewed and are otherwise negative except as noted above.  Physical Exam    Blood pressure 135/87, pulse 68, temperature 98.2 F (36.8 C), temperature source Oral, resp. rate 18, SpO2 97 %.  General: Pleasant, NAD Psych: Flat affect. Neuro: Alert and oriented X 3. Moves all extremities spontaneously. HEENT: Normal  Neck: Supple without bruits or JVD. Lungs:  Resp regular and unlabored, CTA. Heart: RRR no s3, s4, or murmurs. Abdomen: Soft, non-tender, non-distended, BS + x 4.  Extremities: No clubbing, cyanosis or edema. DP/PT/Radials 2+ and equal bilaterally.  Labs    Cardiac Enzymes HsTrop pending    Lab Results  Component Value Date   WBC 5.6 08/11/2019   HGB 15.3 08/11/2019   HCT 45.5 08/11/2019   MCV 93.0 08/11/2019   PLT 210 08/11/2019    Recent Labs  Lab 08/09/19 1737 08/09/19 1738 08/11/19 0243  NA 139   < > 138  K 3.6   < > 3.6  CL 104   < > 105  CO2 26   < > 25  BUN 10   < > 9  CREATININE 1.19   < > 1.17  CALCIUM 9.0   < > 8.9  PROT 7.3   --   --   BILITOT 0.9  --   --   ALKPHOS 84  --   --   ALT 13  --   --   AST 23  --   --   GLUCOSE 122*   < > 107*   < > = values in this interval not displayed.   Lab Results  Component Value Date   CHOL 149 08/10/2019   HDL 34 (L) 08/10/2019   LDLCALC 97 08/10/2019   TRIG 91 08/10/2019     Radiology Studies    CT Code Stroke CTA Head W/WO contrast  Addendum Date: 08/09/2019   ADDENDUM REPORT: 08/09/2019 18:31 ADDENDUM: Please note there is also a moderate to severe stenosis within the A1 right anterior cerebral artery (series 8, image 21) (series 9, image 17). These results were called by telephone at the time of interpretation on 08/09/2019 at 6:31 pm to provider Dr. Laurence Slate, who verbally acknowledged these results. Electronically Signed   By: Jackey Loge DO   On: 08/09/2019 18:31   Result Date: 08/09/2019 CLINICAL DATA:  Stroke code. EXAM: CT ANGIOGRAPHY HEAD TECHNIQUE: Multidetector CT imaging of the head was performed using the standard protocol during bolus administration of intravenous contrast. Multiplanar CT image reconstructions and MIPs were obtained to evaluate the vascular anatomy. CONTRAST:  80mL OMNIPAQUE IOHEXOL 350 MG/ML SOLN COMPARISON:  Noncontrast head CT performed earlier the same day. FINDINGS: Please refer to full dictation of the CTA head/neck and CT perfusion under the CT angiogram neck examination. IMPRESSION: Please refer to full dictation of the CTA head/neck and CT perfusion under the CT angiogram neck examination. Electronically Signed: By: Jackey Loge DO On: 08/09/2019 18:18  CT Code Stroke CTA Neck W/WO contrast  Addendum Date: 08/09/2019   ADDENDUM REPORT: 08/09/2019 22:14 ADDENDUM: Please note there is also a moderate to severe stenosis within the A1 right anterior cerebral artery (series 8, image 21) (series 9, image 17). These results were called by telephone at the time of interpretation on 08/09/2019 at 6:31 pm to provider Dr. Laurence Slate, who verbally  acknowledged these results. Electronically Signed   By: Jackey Loge DO   On: 08/09/2019 22:14   Result Date: 08/09/2019 CLINICAL DATA:  Stroke code. Focal neuro deficit, greater than 6 hours, stroke suspected. EXAM: CT ANGIOGRAPHY HEAD AND NECK CT PERFUSION BRAIN TECHNIQUE: Multidetector CT imaging of the head and neck was performed using the standard protocol during bolus administration of intravenous contrast. Multiplanar CT image reconstructions and MIPs were obtained to evaluate the vascular anatomy. Carotid stenosis measurements (when applicable) are obtained utilizing NASCET criteria, using the distal internal carotid diameter as the denominator. Multiphase CT imaging of the brain was performed following IV bolus contrast injection. Subsequent parametric perfusion maps were calculated using RAPID software. CONTRAST:  Administered contrast not known at this time COMPARISON:  Noncontrast head CT performed earlier the same day. FINDINGS: CTA NECK FINDINGS Aortic arch: Standard aortic branching. No significant innominate or proximal subclavian artery stenosis Right carotid system: CCA and ICA patent within the neck without stenosis. Mild mixed plaque within the carotid bulb. Left carotid system: CCA and ICA patent within the neck without stenosis. Vertebral arteries: Codominant. Patent within the neck without stenosis Skeleton: No acute bony abnormality. Cervical spondylosis with multilevel posterior disc osteophytes, uncovertebral and facet hypertrophy. Other neck: No neck mass or cervical lymphadenopathy. Upper chest: No consolidation within the imaged lung apices. Bullous emphysema. Review of the MIP images confirms the above findings CTA HEAD FINDINGS Anterior circulation: The intracranial internal carotid arteries are patent without high-grade stenosis. 7 mm annular is arising from the cavernous left ICA (series 5, image 124). The M1 right middle cerebral artery is patent without significant stenosis. No  right M2 proximal branch occlusion or high-grade proximal stenosis is identified. The M1 left middle cerebral artery is patent without significant stenosis. No definite M2 proximal branch occlusion is identified. There is a high-grade focal stenosis within a proximal inferior division left M2 branch (series 10, image 12). There is also a high-grade stenosis within a mid to distal superior division M2 branch (series 10, image 12). The anterior cerebral arteries are patent without high-grade proximal stenosis. Posterior circulation: The intracranial right vertebral artery is dominant and patent without significant stenosis. The intracranial left vertebral artery is developmentally diminutive and markedly irregular, but patent. The basilar artery is patent without significant stenosis. The bilateral posterior cerebral arteries are patent without significant proximal stenosis. Posterior communicating arteries are poorly delineated and may be hypoplastic or absent bilaterally. Venous sinuses: Within limitations of contrast timing, no convincing thrombus. Anatomic variants: As described Review of the MIP images confirms the above findings CT Brain Perfusion Findings: ASPECTS: 9 CBF (<30%) Volume: 42mL Perfusion (Tmax>6.0s) volume: 66mL Mismatch Volume: 88mL IMPRESSION: CTA neck: 1. The bilateral common and internal carotid arteries are patent within the neck without significant stenosis. 2. The vertebral arteries are patent within the neck without stenosis. CTA head: 1. No intracranial large vessel occlusion. No definite proximal M2 left MCA branch occlusion is identified. 2. There is a high-grade stenosis within an inferior division proximal left MCA M2 branch. Additional high-grade stenosis within the mid to distal superior division left MCA M2  branch. 3. 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 CT perfusion head: The perfusion software identifies no core infarct. The perfusion software identifies no critically  hypoperfused parenchyma utilizing a Tmax>6 seconds threshold. However, there were clear changes of ischemic infarction within the mid left frontal lobe and left frontal operculum on noncontrast head CT performed immediately prior. Of note, there is a 9 mL region of hypoperfused parenchyma in this region when utilizing a Tmax > 4 seconds threshold. Electronically Signed: By: Jackey Loge DO On: 08/09/2019 18:12   MR BRAIN WO CONTRAST  Result Date: 08/10/2019 CLINICAL DATA:  Right facial droop.  Follow-up acute stroke. EXAM: MRI HEAD WITHOUT CONTRAST TECHNIQUE: Multiplanar, multiecho pulse sequences of the brain and surrounding structures were obtained without intravenous contrast. COMPARISON:  CT studies 08/09/2019 FINDINGS: Brain: No abnormality seen affecting the brainstem, cerebellum or right cerebral hemisphere. In the left cerebral hemisphere, there is a confluent acute infarction measuring 3 x 3 x 1.5 cm within the left frontal operculum. Few other punctate foci of acute infarction posterior to that in the left posterior frontal and frontoparietal junction regions. No evidence of mass effect or hemorrhage. Findings are consistent with left MCA territory embolic infarctions. Vascular: Major vessels at the base of the brain show flow. Skull and upper cervical spine: Negative Sinuses/Orbits: Inflammatory rhinosinusitis, most pronounced affecting the left maxillary sinus. Other: None IMPRESSION: Acute branch vessel infarctions in the left MCA territory. Largest infarction is a 3 x 3 x 1.5 cm left frontal operculum cortical and subcortical infarction. Few other punctate foci of acute infarction posterior to that at the left posterior frontal vertex and frontoparietal vertex. No mass effect. No blood products. Remainder of the brain is normal. Electronically Signed   By: Paulina Fusi M.D.   On: 08/10/2019 02:55   CT Code Stroke Cerebral Perfusion with contrast  Result Date: 08/09/2019 CLINICAL DATA:  Stroke  code. EXAM: CT PERFUSION BRAIN TECHNIQUE: Multiphase CT imaging of the brain was performed following IV bolus contrast injection. Subsequent parametric perfusion maps were calculated using RAPID software. CONTRAST:  76mL OMNIPAQUE IOHEXOL 350 MG/ML SOLN COMPARISON:  Noncontrast head CT performed earlier the same day. FINDINGS: Please refer to complete dictation for the CTA head/neck and CT perfusion under the CTA neck dictation. IMPRESSION: Please refer to complete dictation for the CTA head/neck and CT perfusion under the CTA neck dictation. Electronically Signed   By: Jackey Loge DO   On: 08/09/2019 18:19   CT HEAD CODE STROKE WO CONTRAST  Addendum Date: 08/09/2019   ADDENDUM REPORT: 08/09/2019 22:25 ADDENDUM: Findings omitted from the impression. 7.0 x 2.3 cm cutaneous/subcutaneous soft tissue mass along the lateral aspect of the left temporal bone, involving the left external ear, highly suspicious for malignancy. This finding was discussed with Dr. Laurence Slate at the time of the original dictation at 5:54 p.m. on 08/09/19. Electronically Signed   By: Jackey Loge DO   On: 08/09/2019 22:25   Addendum Date: 08/09/2019   ADDENDUM REPORT: 08/09/2019 17:57 ADDENDUM: These results were called by telephone at the time of interpretation on 08/09/2019 at 5:54 pm to provider Dr. Laurence Slate, who verbally acknowledged these results. Electronically Signed   By: Jackey Loge DO   On: 08/09/2019 17:57   Result Date: 08/09/2019 CLINICAL DATA:  Code stroke.  Aphasia EXAM: CT HEAD WITHOUT CONTRAST TECHNIQUE: Contiguous axial images were obtained from the base of the skull through the vertex without intravenous contrast. COMPARISON:  No pertinent prior studies available  for comparison. FINDINGS: Brain: There is no evidence of acute intracranial hemorrhage. There is abnormal hypodensity consistent with acute/early subacute infarction involving the mid left frontal lobe and left frontal operculum. There is no significant mass effect. No  evidence of intracranial mass. No midline shift or extra-axial fluid collection. Vascular: No hyperdense vessel is identified. Skull: Normal. Negative for fracture or focal lesion. Sinuses/Orbits: Visualized orbits demonstrate no acute abnormality. Paranasal sinus disease. Most notably, there is near complete opacification left maxillary sinus. Right maxillary sinus mucous retention cyst. Scattered opacification of bilateral ethmoid air cells. No significant mastoid effusion. Other: There is a cutaneous/subcutaneous mass along the lateral aspect of the left temporal bone and involving the external ear which measures 7.0 x 2.3 cm in transaxial dimensions, likely reflecting malignancy (series 3, image 9) ASPECTS (Alberta Stroke Program Early CT Score) - Ganglionic level infarction (caudate, lentiform nuclei, internal capsule, insula, M1-M3 cortex): Seven - Supraganglionic infarction (M4-M6 cortex): 2 (point adducted for involvement of the left M5 territory) Total score (0-10 with 10 being normal):  9 IMPRESSION: Acute/early subacute ischemic infarction within the mid left frontal lobe and left frontal operculum. No evidence of hemorrhagic conversion. No significant mass effect. ASPECTS 9 Electronically Signed: By: Jackey Loge DO On: 08/09/2019 17:48    ECG & Cardiac Imaging    RSR, 80, leftward axis, inf TWI, borderline LVH - personally reviewed.  Assessment & Plan    1.  Left MCA territory embolic stroke: Patient admitted March 5 with right-sided weakness, facial droop, and slurred speech.  CT of the head and neck has shown high-grade stenosis in the inferior division of the proximal left MCA M2 branch and high-grade stenosis in the mid to distal superior left MCA M2 branch.  He was also noted to have a 7 mm aneurysm arising from the cavernous left ICA.  MRI showed acute left MCA branch vessel infarctions consistent with embolic stroke.  He has been seen by the neurology team and has been on aspirin and  Plavix.  2D echocardiogram has been performed and shows newly diagnosed cardiomyopathy with an EF of 20-25% and moderate mitral regurgitation..  We will arrange for a transesophageal echocardiogram and ask our electrophysiology team to evaluate for loop monitor placement in the a.m.  2.  Newly diagnosed cardiomyopathy: Patient without prior history of cardiomyopathy or symptoms such as dyspnea, orthopnea, PND, or early satiety.  He did have an episode of chest pain lasting about 15 minutes 1 week ago.  ECG with inferior T wave inversion and borderline LVH.  As noted above, echocardiogram showed an EF of 20-25% with moderate mitral regurgitation.  No evidence of LV thrombus.  We will arrange for TEE in the setting of embolic stroke as outlined above.  He is euvolemic on examination.  We have added low-dose carvedilol and losartan therapy.  Given reported history of chest pain, I will send off a high-sensitivity troponin.  He will require ischemic evaluation and this can most likely be performed in the outpatient setting following stroke recovery.  He will need heart failure education.  3.  Essential hypertension: Not previously diagnosed.  Blood pressure trending 130s 140s during hospitalization.  As above, in the setting of cardiomyopathy, adding low-dose beta-blocker and ARB therapy.  4.  Hyperlipidemia: Statin therapy started.  LDL 97.  5.  Tobacco abuse: Smoking half pack a day for most of his adult life.  Cessation advised.  6.  7 mm aneurysm arising from the cavernous left ICA: Noted on  CTA of the head.  Neurology following.  Blood pressure stable.  7.  Left facial/cranial mass: 7 x 2.3 cm cutaneous/subcutaneous soft tissue mass along the lateral aspect of left temporal bone involving the left external ear noted on CT.  Per radiology, suspicious for malignancy.  Will need further outpatient evaluation.  Signed, Murray Hodgkins, NP 08/11/2019, 12:25 PM  For questions or updates, please contact    Please consult [email or web address] for contact info under Cardiology/STEMI. As above, patient seen and examined.  Briefly he is a 54 year old male with past medical history only of tobacco abuse admitted with CVA for evaluation of cardiomyopathy.  Patient describes brief episode of chest pain 2 days ago that was substernal without radiation.  Lasted approximately 15 minutes and resolved.  History difficult as he has an expressive aphasia.  He then developed difficulty speaking and right upper extremity weakness.  CT shows acute/early subacute ischemic infarction in the left frontal lobe and left frontal operculum.  There is also a left temporal mass suspicious for malignancy.  CTA showed high-grade stenosis involving the inferior division of the proximal left MCA M2 branch and mid to distal superior division left MCA M2 branch.  7 mm aneurysm from the left internal carotid artery.  MRI shows acute left MCA branch vessel infarctions.  Echocardiogram shows severe LV dysfunction and moderate mitral regurgitation.  Electrocardiogram shows sinus rhythm with no ST changes.  1 CVA-continue aspirin and Plavix.  Echocardiogram shows cardiomyopathy with ejection fraction 20 to 25%.  However there is no clear apical thrombus.  We will arrange transesophageal echocardiogram to rule out left atrial appendage thrombus or other cardiac embolic sources.  If not we will arrange loop monitor to rule out atrial fibrillation.  2 cardiomyopathy-etiology unclear.  No history of alcohol abuse.  Patient did have a recent episode of chest pain so we will cycle enzymes.  However LV dysfunction appears to be global.  We will add low-dose carvedilol and losartan and titrate as tolerated.  Once he recovers from CVA he will need an ischemia evaluation.  He will also need a repeat echocardiogram once medications are fully titrated to see if LV function has improved.  3 hypertension-we will add above medications for cardiomyopathy.  Permissive  hypertension for now given recent CVA.  As he recovers we will advance regimen.  4 tobacco abuse-patient counseled on discontinuing.  5 left temporal mass-further evaluation for primary care.  Kirk Ruths

## 2019-08-12 ENCOUNTER — Inpatient Hospital Stay (HOSPITAL_COMMUNITY): Payer: Managed Care, Other (non HMO)

## 2019-08-12 ENCOUNTER — Other Ambulatory Visit: Payer: Self-pay | Admitting: Student

## 2019-08-12 ENCOUNTER — Inpatient Hospital Stay (HOSPITAL_COMMUNITY): Payer: Managed Care, Other (non HMO) | Admitting: Anesthesiology

## 2019-08-12 ENCOUNTER — Encounter (HOSPITAL_COMMUNITY): Payer: Self-pay | Admitting: Internal Medicine

## 2019-08-12 ENCOUNTER — Encounter (HOSPITAL_COMMUNITY)
Admission: EM | Disposition: A | Discharge status: Home / Self Care | Payer: Self-pay | Source: Home / Self Care | Attending: Internal Medicine

## 2019-08-12 DIAGNOSIS — I5022 Chronic systolic (congestive) heart failure: Secondary | ICD-10-CM

## 2019-08-12 DIAGNOSIS — I63412 Cerebral infarction due to embolism of left middle cerebral artery: Principal | ICD-10-CM

## 2019-08-12 DIAGNOSIS — I639 Cerebral infarction, unspecified: Secondary | ICD-10-CM

## 2019-08-12 DIAGNOSIS — Z720 Tobacco use: Secondary | ICD-10-CM

## 2019-08-12 DIAGNOSIS — I340 Nonrheumatic mitral (valve) insufficiency: Secondary | ICD-10-CM

## 2019-08-12 DIAGNOSIS — G9389 Other specified disorders of brain: Secondary | ICD-10-CM

## 2019-08-12 HISTORY — PX: BUBBLE STUDY: SHX6837

## 2019-08-12 HISTORY — PX: TEE WITHOUT CARDIOVERSION: SHX5443

## 2019-08-12 LAB — CBC
HCT: 46.6 % (ref 39.0–52.0)
Hemoglobin: 15.8 g/dL (ref 13.0–17.0)
MCH: 31.7 pg (ref 26.0–34.0)
MCHC: 33.9 g/dL (ref 30.0–36.0)
MCV: 93.6 fL (ref 80.0–100.0)
Platelets: 227 10*3/uL (ref 150–400)
RBC: 4.98 MIL/uL (ref 4.22–5.81)
RDW: 12 % (ref 11.5–15.5)
WBC: 6.9 10*3/uL (ref 4.0–10.5)
nRBC: 0 % (ref 0.0–0.2)

## 2019-08-12 LAB — BASIC METABOLIC PANEL
Anion gap: 9 (ref 5–15)
BUN: 14 mg/dL (ref 6–20)
CO2: 26 mmol/L (ref 22–32)
Calcium: 9.1 mg/dL (ref 8.9–10.3)
Chloride: 105 mmol/L (ref 98–111)
Creatinine, Ser: 1.2 mg/dL (ref 0.61–1.24)
GFR calc Af Amer: >60 mL/min (ref >60)
GFR calc non Af Amer: >60 mL/min (ref >60)
Glucose, Bld: 101 mg/dL — ABNORMAL HIGH (ref 70–99)
Potassium: 4 mmol/L (ref 3.5–5.1)
Sodium: 140 mmol/L (ref 135–145)

## 2019-08-12 LAB — GLUCOSE, CAPILLARY: Glucose-Capillary: 130 mg/dL — ABNORMAL HIGH (ref 70–99)

## 2019-08-12 SURGERY — ECHOCARDIOGRAM, TRANSESOPHAGEAL
Anesthesia: Monitor Anesthesia Care

## 2019-08-12 MED ORDER — SODIUM CHLORIDE 0.9 % IV SOLN: INTRAVENOUS | Status: DC

## 2019-08-12 MED ORDER — LIDOCAINE 2% (20 MG/ML) 5 ML SYRINGE
INTRAMUSCULAR | Status: DC | PRN
  Administered 2019-08-12: 60 mg via INTRAVENOUS

## 2019-08-12 MED ORDER — APIXABAN 5 MG PO TABS
5.0000 mg | ORAL_TABLET | Freq: Two times a day (BID) | ORAL | Status: DC
  Administered 2019-08-12 – 2019-08-14 (×4): 5 mg via ORAL
  Filled 2019-08-12 (×4): qty 1

## 2019-08-12 MED ORDER — PROPOFOL 500 MG/50ML IV EMUL
INTRAVENOUS | Status: DC | PRN
  Administered 2019-08-12: 100 ug/kg/min via INTRAVENOUS

## 2019-08-12 NOTE — Anesthesia Preprocedure Evaluation (Addendum)
Anesthesia Evaluation  Patient identified by MRN, date of birth, ID band Patient awake    Reviewed: Allergy & Precautions, NPO status , Patient's Chart, lab work & pertinent test results  Airway Mallampati: II  TM Distance: >3 FB Neck ROM: Full    Dental no notable dental hx. (+) Dental Advisory Given, Poor Dentition   Pulmonary Current Smoker,  18 pack year history   Pulmonary exam normal breath sounds clear to auscultation       Cardiovascular +CHF  Normal cardiovascular exam+ Valvular Problems/Murmurs MR  Rhythm:Regular Rate:Normal  Echo 08/10/2019: 1. Left ventricular ejection fraction, by estimation, is 20 to 25%. The left ventricle has severely decreased function. The left ventricle demonstrates global hypokinesis. The left ventricular internal cavity size was moderately dilated. Left ventricular diastolic parameters are consistent with Grade II diastolic dysfunction (pseudonormalization). Elevated left atrial pressure.  2. Right ventricular systolic function is normal. The right ventricular size is normal. There is mildly elevated pulmonary artery systolic pressure. The estimated right ventricular systolic pressure is 82.9 mmHg.  3. Left atrial size was mildly dilated.  4. The mitral valve is normal in structure and function. Moderate mitral valve regurgitation.  5. The aortic valve is normal in structure and function. Aortic valve regurgitation is not visualized.    Neuro/Psych 08/2019 MRI: Acute L MCA branch vessel infarctions; b. 08/2019 CTA head/neck: Patent bilat carotids/vertebrals. High grade stenosis - inf division of prox L MCA M2 branch. additional high-grade mid-dist superior L MCA M2 branch occlusion. 2m aneurysm arising from cavernous L ICA. CVA negative psych ROS   GI/Hepatic negative GI ROS, (+)     substance abuse  marijuana use,   Endo/Other  negative endocrine ROS  Renal/GU negative Renal ROS  negative  genitourinary   Musculoskeletal negative musculoskeletal ROS (+)   Abdominal Normal abdominal exam  (+)   Peds negative pediatric ROS (+)  Hematology negative hematology ROS (+)   Anesthesia Other Findings HLD  Reproductive/Obstetrics negative OB ROS                           Anesthesia Physical Anesthesia Plan  ASA: III  Anesthesia Plan: MAC   Post-op Pain Management:    Induction:   PONV Risk Score and Plan: Propofol infusion and TIVA  Airway Management Planned: Natural Airway and Nasal Cannula  Additional Equipment: None  Intra-op Plan:   Post-operative Plan:   Informed Consent: I have reviewed the patients History and Physical, chart, labs and discussed the procedure including the risks, benefits and alternatives for the proposed anesthesia with the patient or authorized representative who has indicated his/her understanding and acceptance.       Plan Discussed with: CRNA  Anesthesia Plan Comments:         Anesthesia Quick Evaluation

## 2019-08-12 NOTE — Transfer of Care (Signed)
Immediate Anesthesia Transfer of Care Note  Patient: David [name]  Procedure(s) Performed: TRANSESOPHAGEAL ECHOCARDIOGRAM (TEE) (N/A ) BUBBLE STUDY  Patient Location: Endoscopy Unit  Anesthesia Type:MAC  Level of Consciousness: awake, alert  and oriented  Airway & Oxygen Therapy: Patient Spontanous Breathing and Patient connected to nasal cannula oxygen  Post-op Assessment: Report given to RN, Post -op Vital signs reviewed and stable and Patient moving all extremities X 4  Post vital signs: Reviewed and stable  Last Vitals:  Vitals Value Taken Time  BP 111/76 08/12/19 1419  Temp 36.5 C 08/12/19 1419  Pulse 106 08/12/19 1420  Resp 27 08/12/19 1420  SpO2 97 % 08/12/19 1420  Vitals shown include unvalidated device data.  Last Pain:  Vitals:   08/12/19 1419  TempSrc: Axillary  PainSc:          Complications: No apparent anesthesia complications

## 2019-08-12 NOTE — TOC Transition Note (Signed)
Transition of Care [facility]) - CM/SW Discharge Note   Patient Details  Name: LYNN SISSEL MRN: [id number] Date of Birth: Feb 07, 1966  Transition of Care [facility]) CM/SW Contact:  Kermit Balo, RN Phone Number: 08/12/2019, 1:50 PM   Clinical Narrative:    Pt with no f/u recommendations per PT/OT and no DME needs.  Girlfriend at the bedside states she can provide needed supervision at home.  Pt without a PCP. CM was able to get him an appt at [facility]. Information on the AVS. CM provided him resources for inpatient/ outpatient drug counseling.  Pt has transport home when d/ced.    Final next level of care: Home/Self Care Barriers to Discharge: No Barriers Identified   Patient Goals and CMS Choice        Discharge Placement                       Discharge Plan and Services                                     Social Determinants of Health (SDOH) Interventions     Readmission Risk Interventions No flowsheet data found.

## 2019-08-12 NOTE — Progress Notes (Addendum)
Progress Note  Patient Name: David [name] Date of Encounter: 08/12/2019  Primary Cardiologist: Olga Millers, MD   Subjective   Denies any SOB. Had a episode of chest pain a week ago  Inpatient Medications    Scheduled Meds: . aspirin EC  81 mg Oral Daily  . atorvastatin  80 mg Oral q1800  . carvedilol  3.125 mg Oral BID WC  . clopidogrel  75 mg Oral Daily  . enoxaparin (LOVENOX) injection  40 mg Subcutaneous Q24H  . LORazepam  1 mg Intravenous Once  . losartan  25 mg Oral Daily   Continuous Infusions:  PRN Meds: acetaminophen **OR** acetaminophen (TYLENOL) oral liquid 160 mg/5 mL **OR** acetaminophen, senna-docusate   Vital Signs    Vitals:   08/11/19 2033 08/12/19 0022 08/12/19 0316 08/12/19 0835  BP: 128/81 126/85 (!) 144/88 124/78  Pulse: (!) 59 61 63 71  Resp: 17 18 18 18   Temp: 98.3 F (36.8 C) 98.2 F (36.8 C) 97.9 F (36.6 C) 98 F (36.7 C)  TempSrc: Oral Oral Oral Oral  SpO2: 97% 100% 99% 99%   No intake or output data in the 24 hours ending 08/12/19 0918 Last 3 Weights 05/13/2018 01/02/2018 09/13/2017  Weight (lbs) 165 lb 170 lb 172 lb  Weight (kg) 74.844 kg 77.111 kg 78.019 kg      Telemetry    NSR without significant ventricular ectopy - Personally Reviewed  ECG    NSR without significant ST-T wave changes - Personally Reviewed  Physical Exam   GEN: No acute distress.   Neck: No JVD Cardiac: RRR, no murmurs, rubs, or gallops.  Respiratory: Clear to auscultation bilaterally. GI: Soft, nontender, non-distended  MS: No edema; No deformity. Neuro:  Nonfocal  Psych: Normal affect   Labs    High Sensitivity Troponin:   Recent Labs  Lab 08/11/19 1226  TROPONINIHS 7     Chemistry Recent Labs  Lab 08/09/19 1737 08/09/19 1737 08/09/19 1738 08/11/19 0243 08/12/19 0435  NA 139   < > 141 138 140  K 3.6   < > 3.5 3.6 4.0  CL 104   < > 103 105 105  CO2 26  --   --  25 26  GLUCOSE 122*   < > 118* 107* 101*  BUN 10   < > 11 9 14     CREATININE 1.19   < > 1.10 1.17 1.20  CALCIUM 9.0  --   --  8.9 9.1  PROT 7.3  --   --   --   --   ALBUMIN 4.0  --   --   --   --   AST 23  --   --   --   --   ALT 13  --   --   --   --   ALKPHOS 84  --   --   --   --   BILITOT 0.9  --   --   --   --   GFRNONAA >60  --   --  >60 >60  GFRAA >60  --   --  >60 >60  ANIONGAP 9  --   --  8 9   < > = values in this interval not displayed.    Hematology Recent Labs  Lab 08/09/19 1737 08/09/19 1737 08/09/19 1738 08/11/19 0243 08/12/19 0435  WBC 7.1  --   --  5.6 6.9  RBC 4.54  --   --  4.89 4.98  HGB 14.6   < > 14.6 15.3 15.8  HCT 42.7   < > 43.0 45.5 46.6  MCV 94.1  --   --  93.0 93.6  MCH 32.2  --   --  31.3 31.7  MCHC 34.2  --   --  33.6 33.9  RDW 12.2  --   --  12.0 12.0  PLT 213  --   --  210 227   < > = values in this interval not displayed.   BNPNo results for input(s): BNP, PROBNP in the last 168 hours.   DDimer No results for input(s): DDIMER in the last 168 hours.   Radiology    ECHOCARDIOGRAM COMPLETE  Result Date: 08/10/2019    ECHOCARDIOGRAM REPORT   Patient Name:   David [name] [facility] Date of Exam: 08/10/2019 Medical Rec #:  [id number]       Height:       71.0 in Accession #:    [id number]      Weight:       165.0 lb Date of Birth:  [birth year]       BSA:          1.943 m Patient Age:    54 years        BP:           146/85 mmHg Patient Gender: M               HR:           61 bpm. Exam Location:  Inpatient Procedure: 2D Echo Indications:    Stroke 434.91 / I163.9  History:        Patient has no prior history of Echocardiogram examinations.                 Risk Factors:Tobacco abuse.  Sonographer:    Vikki Ports Turrentine Referring Phys: [id number]  1. Left ventricular ejection fraction, by estimation, is 20 to 25%. The left ventricle has severely decreased function. The left ventricle demonstrates global hypokinesis. The left ventricular internal cavity size was moderately dilated. Left ventricular  diastolic parameters are consistent with Grade II diastolic dysfunction (pseudonormalization). Elevated left atrial pressure.  2. Right ventricular systolic function is normal. The right ventricular size is normal. There is mildly elevated pulmonary artery systolic pressure. The estimated right ventricular systolic pressure is 92.1 mmHg.  3. Left atrial size was mildly dilated.  4. The mitral valve is normal in structure and function. Moderate mitral valve regurgitation.  5. The aortic valve is normal in structure and function. Aortic valve regurgitation is not visualized. Comparison(s): No prior Echocardiogram. Conclusion(s)/Recommendation(s): No intracardiac source of embolism detected on this transthoracic study. Specifically, no left ventricular thrombus is seen, but the left ventricular syst olic function is severely decreased, thereby increasing the risk of intracardiac thrombus. FINDINGS  Left Ventricle: Left ventricular ejection fraction, by estimation, is 20 to 25%. The left ventricle has severely decreased function. The left ventricle demonstrates global hypokinesis. The left ventricular internal cavity size was moderately dilated. There is no left ventricular hypertrophy. Left ventricular diastolic parameters are consistent with Grade II diastolic dysfunction (pseudonormalization). Elevated left atrial pressure. Right Ventricle: The right ventricular size is normal. No increase in right ventricular wall thickness. Right ventricular systolic function is normal. There is mildly elevated pulmonary artery systolic pressure. The tricuspid regurgitant velocity is 2.61  m/s, and with an assumed right atrial pressure of 3 mmHg, the estimated right ventricular systolic pressure is 30.2  mmHg. Left Atrium: Left atrial size was mildly dilated. Right Atrium: Right atrial size was normal in size. Pericardium: There is no evidence of pericardial effusion. Mitral Valve: The mitral valve is normal in structure and  function. Moderate mitral valve regurgitation, with centrally-directed jet. Tricuspid Valve: The tricuspid valve is normal in structure. Tricuspid valve regurgitation is trivial. Aortic Valve: The aortic valve is normal in structure and function. Aortic valve regurgitation is not visualized. Pulmonic Valve: The pulmonic valve was normal in structure. Pulmonic valve regurgitation is not visualized. Aorta: The aortic root and ascending aorta are structurally normal, with no evidence of dilitation. IAS/Shunts: No atrial level shunt detected by color flow Doppler.  LEFT VENTRICLE PLAX 2D LVIDd:         6.87 cm      Diastology LVIDs:         6.09 cm      LV e' lateral:   4.62 cm/s LV PW:         0.96 cm      LV E/e' lateral: 14.6 LV IVS:        0.98 cm      LV e' medial:    5.11 cm/s LVOT diam:     1.90 cm      LV E/e' medial:  13.2 LV SV:         36 LV SV Index:   19 LVOT Area:     2.84 cm  LV Volumes (MOD) LV vol d, MOD A2C: 139.0 ml LV vol d, MOD A4C: 248.0 ml LV vol s, MOD A2C: 94.5 ml LV vol s, MOD A4C: 186.0 ml LV SV MOD A2C:     44.5 ml LV SV MOD A4C:     248.0 ml LV SV MOD BP:      57.3 ml RIGHT VENTRICLE RV S prime:     15.20 cm/s TAPSE (M-mode): 2.2 cm LEFT ATRIUM             Index       RIGHT ATRIUM           Index LA diam:        4.20 cm 2.16 cm/m  RA Area:     16.30 cm LA Vol (A2C):   43.6 ml 22.44 ml/m RA Volume:   40.90 ml  21.05 ml/m LA Vol (A4C):   74.7 ml 38.45 ml/m LA Biplane Vol: 59.7 ml 30.73 ml/m  AORTIC VALVE LVOT Vmax:   76.40 cm/s LVOT Vmean:  54.400 cm/s LVOT VTI:    0.128 m  AORTA Ao Root diam: 3.10 cm MITRAL VALVE               TRICUSPID VALVE MV Area (PHT): 3.31 cm    TR Peak grad:   27.2 mmHg MV Decel Time: 229 msec    TR Vmax:        261.00 cm/s MV E velocity: 67.30 cm/s MV A velocity: 57.80 cm/s  SHUNTS MV E/A ratio:  1.16        Systemic VTI:  0.13 m                            Systemic Diam: 1.90 cm Thurmon Fair MD Electronically signed by Thurmon Fair MD Signature Date/Time:  08/10/2019/3:56:16 PM    Final    Cardiac Studies   Echo 08/10/2019 demonstrates global hypokinesis. The left ventricular internal cavity size  was moderately dilated. Left  ventricular  diastolic parameters are consistent with Grade II diastolic  dysfunction (pseudonormalization). Elevated left atrial pressure.  2. Right ventricular systolic function is normal. The right ventricular  size is normal. There is mildly elevated pulmonary artery systolic  pressure. The estimated right ventricular systolic pressure is 30.2 mmHg.  3. Left atrial size was mildly dilated.  4. The mitral valve is normal in structure and function. Moderate mitral  valve regurgitation.  5. The aortic valve is normal in structure and function. Aortic valve  regurgitation is not visualized.     Patient Profile     54 y.o. male with PMH of tobacco abuse and probable untreated HTN presented with R facial droop and slurred speech. CT showed acute/subacute infarction in the mid L frontal and left frontal operculum. Also noted to have a 7x2.3 cm soft tissue mass along the lateral aspect of L temporal bone involving L external ear concerning for malignancy. CTA of head and neck showed high grade stenosis of prox L MCA, 64mm aneurysm arising from cavernous L ICA. MRI showed L MCA infarction consistent with embolic stroke. Echo showed EF 20-25% with global hypokinesis and grade 2 DD.   Assessment & Plan    1. Cardiomyopathy  - single episode of chest pain a week ago lasted 15 min, Hs troponin negative  - EF 20-25% on echocardiogram.   - plan ischemic eval once recovers from CVA, given his thin body size, he would be a good candidate for coronary CT. Once CHF med fully titrated, will to repeat echo  - on low dose coreg and losartan, hesitant to increase given permissive hypertension. Will need followup in 2-3 weeks with Dr. [name] or his APP. Plan to switch losartan to entresto as outpatient.   2. L MCA CVA  - pending TEE  today, EP consulted for loop recorder and felt the patient is not a candidate given low EF and possibly needing ICD in the future if EF does not improve, possibly discharge on event monitor.  3. Moderate MR: noted on echo, followup as outpatient  4. HTN: suspect uncontrolled given no prior diagnosis. CHF regimen  5. Tobacco abuse: cessation advised  6. 66mm aneurysm in the cavernous L ICA: noted on CT  7. L cranial mass: 7 x 2.3 cm: further evaluation by primary care  For questions or updates, please contact [facility] Please consult [email or web address] for contact info under     Signed, Azalee Course, PA  08/12/2019, 9:18 AM     The patient was seen, examined and discussed with Azalee Course, PA-C and I agree with the above.   The patient is feeling better, he appears euvolemic, denies chest pain, SOB, he is scheduled for a TEE today, probably not a candidate for loop given low LVEF and possible ICD implantation if his LVEF doesn't improve. We will arrange for a cardiac MRI to evaluate for etiology of CHF. His sister also has CHF and received an ICD at age 35. Currently on low dose losartan and carvedilol, ideally we will switch to [facility] if his BP improves.  Tobias Alexander, MD 08/12/2019

## 2019-08-12 NOTE — Progress Notes (Signed)
  Echocardiogram Echocardiogram Transesophageal has been performed.  Leta Jungling M 08/12/2019, 2:32 PM

## 2019-08-12 NOTE — Anesthesia Postprocedure Evaluation (Signed)
Anesthesia Post Note  Patient: David [name]  Procedure(s) Performed: TRANSESOPHAGEAL ECHOCARDIOGRAM (TEE) (N/A ) BUBBLE STUDY     Patient location during evaluation: Endoscopy Anesthesia Type: MAC Level of consciousness: awake and alert Pain management: pain level controlled Vital Signs Assessment: post-procedure vital signs reviewed and stable Respiratory status: spontaneous breathing, nonlabored ventilation, respiratory function stable and patient connected to nasal cannula oxygen Cardiovascular status: blood pressure returned to baseline and stable Postop Assessment: no apparent nausea or vomiting Anesthetic complications: no    Last Vitals:  Vitals:   08/12/19 1430 08/12/19 1549  BP: 122/80 115/74  Pulse: 94 68  Resp: 17 18  Temp:  37.1 C  SpO2: 96% 98%    Last Pain:  Vitals:   08/12/19 1549  TempSrc: Oral  PainSc:                  [place]et [place]

## 2019-08-12 NOTE — H&P (View-Only) (Signed)
 Progress Note  Patient Name: David [name] Date of Encounter: 08/12/2019  Primary Cardiologist: Brian Crenshaw, MD   Subjective   Denies any SOB. Had a episode of chest pain a week ago  Inpatient Medications    Scheduled Meds: . aspirin EC  81 mg Oral Daily  . atorvastatin  80 mg Oral q1800  . carvedilol  3.125 mg Oral BID WC  . clopidogrel  75 mg Oral Daily  . enoxaparin (LOVENOX) injection  40 mg Subcutaneous Q24H  . LORazepam  1 mg Intravenous Once  . losartan  25 mg Oral Daily   Continuous Infusions:  PRN Meds: acetaminophen **OR** acetaminophen (TYLENOL) oral liquid 160 mg/5 mL **OR** acetaminophen, senna-docusate   Vital Signs    Vitals:   08/11/19 2033 08/12/19 0022 08/12/19 0316 08/12/19 0835  BP: 128/81 126/85 (!) 144/88 124/78  Pulse: (!) 59 61 63 71  Resp: 17 18 18 18  Temp: 98.3 F (36.8 C) 98.2 F (36.8 C) 97.9 F (36.6 C) 98 F (36.7 C)  TempSrc: Oral Oral Oral Oral  SpO2: 97% 100% 99% 99%   No intake or output data in the 24 hours ending 08/12/19 0918 Last 3 Weights 05/13/2018 01/02/2018 09/13/2017  Weight (lbs) 165 lb 170 lb 172 lb  Weight (kg) 74.844 kg 77.111 kg 78.019 kg      Telemetry    NSR without significant ventricular ectopy - Personally Reviewed  ECG    NSR without significant ST-T wave changes - Personally Reviewed  Physical Exam   GEN: No acute distress.   Neck: No JVD Cardiac: RRR, no murmurs, rubs, or gallops.  Respiratory: Clear to auscultation bilaterally. GI: Soft, nontender, non-distended  MS: No edema; No deformity. Neuro:  Nonfocal  Psych: Normal affect   Labs    High Sensitivity Troponin:   Recent Labs  Lab 08/11/19 1226  TROPONINIHS 7     Chemistry Recent Labs  Lab 08/09/19 1737 08/09/19 1737 08/09/19 1738 08/11/19 0243 08/12/19 0435  NA 139   < > 141 138 140  K 3.6   < > 3.5 3.6 4.0  CL 104   < > 103 105 105  CO2 26  --   --  25 26  GLUCOSE 122*   < > 118* 107* 101*  BUN 10   < > 11 9 14    CREATININE 1.19   < > 1.10 1.17 1.20  CALCIUM 9.0  --   --  8.9 9.1  PROT 7.3  --   --   --   --   ALBUMIN 4.0  --   --   --   --   AST 23  --   --   --   --   ALT 13  --   --   --   --   ALKPHOS 84  --   --   --   --   BILITOT 0.9  --   --   --   --   GFRNONAA >60  --   --  >60 >60  GFRAA >60  --   --  >60 >60  ANIONGAP 9  --   --  8 9   < > = values in this interval not displayed.    Hematology Recent Labs  Lab 08/09/19 1737 08/09/19 1737 08/09/19 1738 08/11/19 0243 08/12/19 0435  WBC 7.1  --   --  5.6 6.9  RBC 4.54  --   --  4.89 4.98    HGB 14.6   < > 14.6 15.3 15.8  HCT 42.7   < > 43.0 45.5 46.6  MCV 94.1  --   --  93.0 93.6  MCH 32.2  --   --  31.3 31.7  MCHC 34.2  --   --  33.6 33.9  RDW 12.2  --   --  12.0 12.0  PLT 213  --   --  210 227   < > = values in this interval not displayed.   BNPNo results for input(s): BNP, PROBNP in the last 168 hours.   DDimer No results for input(s): DDIMER in the last 168 hours.   Radiology    ECHOCARDIOGRAM COMPLETE  Result Date: 08/10/2019    ECHOCARDIOGRAM REPORT   Patient Name:   David [name] [facility] Date of Exam: 08/10/2019 Medical Rec #:  [id number]       Height:       71.0 in Accession #:    [id number]      Weight:       165.0 lb Date of Birth:  [birth year]       BSA:          1.943 m Patient Age:    54 years        BP:           146/85 mmHg Patient Gender: M               HR:           61 bpm. Exam Location:  Inpatient Procedure: 2D Echo Indications:    Stroke 434.91 / I163.9  History:        Patient has no prior history of Echocardiogram examinations.                 Risk Factors:Tobacco abuse.  Sonographer:    Vikki Ports Turrentine Referring Phys: [id number]  1. Left ventricular ejection fraction, by estimation, is 20 to 25%. The left ventricle has severely decreased function. The left ventricle demonstrates global hypokinesis. The left ventricular internal cavity size was moderately dilated. Left ventricular  diastolic parameters are consistent with Grade II diastolic dysfunction (pseudonormalization). Elevated left atrial pressure.  2. Right ventricular systolic function is normal. The right ventricular size is normal. There is mildly elevated pulmonary artery systolic pressure. The estimated right ventricular systolic pressure is 92.1 mmHg.  3. Left atrial size was mildly dilated.  4. The mitral valve is normal in structure and function. Moderate mitral valve regurgitation.  5. The aortic valve is normal in structure and function. Aortic valve regurgitation is not visualized. Comparison(s): No prior Echocardiogram. Conclusion(s)/Recommendation(s): No intracardiac source of embolism detected on this transthoracic study. Specifically, no left ventricular thrombus is seen, but the left ventricular syst olic function is severely decreased, thereby increasing the risk of intracardiac thrombus. FINDINGS  Left Ventricle: Left ventricular ejection fraction, by estimation, is 20 to 25%. The left ventricle has severely decreased function. The left ventricle demonstrates global hypokinesis. The left ventricular internal cavity size was moderately dilated. There is no left ventricular hypertrophy. Left ventricular diastolic parameters are consistent with Grade II diastolic dysfunction (pseudonormalization). Elevated left atrial pressure. Right Ventricle: The right ventricular size is normal. No increase in right ventricular wall thickness. Right ventricular systolic function is normal. There is mildly elevated pulmonary artery systolic pressure. The tricuspid regurgitant velocity is 2.61  m/s, and with an assumed right atrial pressure of 3 mmHg, the estimated right ventricular systolic pressure is 30.2  mmHg. Left Atrium: Left atrial size was mildly dilated. Right Atrium: Right atrial size was normal in size. Pericardium: There is no evidence of pericardial effusion. Mitral Valve: The mitral valve is normal in structure and  function. Moderate mitral valve regurgitation, with centrally-directed jet. Tricuspid Valve: The tricuspid valve is normal in structure. Tricuspid valve regurgitation is trivial. Aortic Valve: The aortic valve is normal in structure and function. Aortic valve regurgitation is not visualized. Pulmonic Valve: The pulmonic valve was normal in structure. Pulmonic valve regurgitation is not visualized. Aorta: The aortic root and ascending aorta are structurally normal, with no evidence of dilitation. IAS/Shunts: No atrial level shunt detected by color flow Doppler.  LEFT VENTRICLE PLAX 2D LVIDd:         6.87 cm      Diastology LVIDs:         6.09 cm      LV e' lateral:   4.62 cm/s LV PW:         0.96 cm      LV E/e' lateral: 14.6 LV IVS:        0.98 cm      LV e' medial:    5.11 cm/s LVOT diam:     1.90 cm      LV E/e' medial:  13.2 LV SV:         36 LV SV Index:   19 LVOT Area:     2.84 cm  LV Volumes (MOD) LV vol d, MOD A2C: 139.0 ml LV vol d, MOD A4C: 248.0 ml LV vol s, MOD A2C: 94.5 ml LV vol s, MOD A4C: 186.0 ml LV SV MOD A2C:     44.5 ml LV SV MOD A4C:     248.0 ml LV SV MOD BP:      57.3 ml RIGHT VENTRICLE RV S prime:     15.20 cm/s TAPSE (M-mode): 2.2 cm LEFT ATRIUM             Index       RIGHT ATRIUM           Index LA diam:        4.20 cm 2.16 cm/m  RA Area:     16.30 cm LA Vol (A2C):   43.6 ml 22.44 ml/m RA Volume:   40.90 ml  21.05 ml/m LA Vol (A4C):   74.7 ml 38.45 ml/m LA Biplane Vol: 59.7 ml 30.73 ml/m  AORTIC VALVE LVOT Vmax:   76.40 cm/s LVOT Vmean:  54.400 cm/s LVOT VTI:    0.128 m  AORTA Ao Root diam: 3.10 cm MITRAL VALVE               TRICUSPID VALVE MV Area (PHT): 3.31 cm    TR Peak grad:   27.2 mmHg MV Decel Time: 229 msec    TR Vmax:        261.00 cm/s MV E velocity: 67.30 cm/s MV A velocity: 57.80 cm/s  SHUNTS MV E/A ratio:  1.16        Systemic VTI:  0.13 m                            Systemic Diam: 1.90 cm Thurmon Fair MD Electronically signed by Thurmon Fair MD Signature Date/Time:  08/10/2019/3:56:16 PM    Final    Cardiac Studies   Echo 08/10/2019 demonstrates global hypokinesis. The left ventricular internal cavity size  was moderately dilated. Left  ventricular  diastolic parameters are consistent with Grade II diastolic  dysfunction (pseudonormalization). Elevated left atrial pressure.  2. Right ventricular systolic function is normal. The right ventricular  size is normal. There is mildly elevated pulmonary artery systolic  pressure. The estimated right ventricular systolic pressure is 30.2 mmHg.  3. Left atrial size was mildly dilated.  4. The mitral valve is normal in structure and function. Moderate mitral  valve regurgitation.  5. The aortic valve is normal in structure and function. Aortic valve  regurgitation is not visualized.     Patient Profile     54 y.o. male with PMH of tobacco abuse and probable untreated HTN presented with R facial droop and slurred speech. CT showed acute/subacute infarction in the mid L frontal and left frontal operculum. Also noted to have a 7x2.3 cm soft tissue mass along the lateral aspect of L temporal bone involving L external ear concerning for malignancy. CTA of head and neck showed high grade stenosis of prox L MCA, 64mm aneurysm arising from cavernous L ICA. MRI showed L MCA infarction consistent with embolic stroke. Echo showed EF 20-25% with global hypokinesis and grade 2 DD.   Assessment & Plan    1. Cardiomyopathy  - single episode of chest pain a week ago lasted 15 min, Hs troponin negative  - EF 20-25% on echocardiogram.   - plan ischemic eval once recovers from CVA, given his thin body size, he would be a good candidate for coronary CT. Once CHF med fully titrated, will to repeat echo  - on low dose coreg and losartan, hesitant to increase given permissive hypertension. Will need followup in 2-3 weeks with Dr. [name] or his APP. Plan to switch losartan to entresto as outpatient.   2. L MCA CVA  - pending TEE  today, EP consulted for loop recorder and felt the patient is not a candidate given low EF and possibly needing ICD in the future if EF does not improve, possibly discharge on event monitor.  3. Moderate MR: noted on echo, followup as outpatient  4. HTN: suspect uncontrolled given no prior diagnosis. CHF regimen  5. Tobacco abuse: cessation advised  6. 66mm aneurysm in the cavernous L ICA: noted on CT  7. L cranial mass: 7 x 2.3 cm: further evaluation by primary care  For questions or updates, please contact [facility] Please consult [email or web address] for contact info under     Signed, Azalee Course, PA  08/12/2019, 9:18 AM     The patient was seen, examined and discussed with Azalee Course, PA-C and I agree with the above.   The patient is feeling better, he appears euvolemic, denies chest pain, SOB, he is scheduled for a TEE today, probably not a candidate for loop given low LVEF and possible ICD implantation if his LVEF doesn't improve. We will arrange for a cardiac MRI to evaluate for etiology of CHF. His sister also has CHF and received an ICD at age 35. Currently on low dose losartan and carvedilol, ideally we will switch to [facility] if his BP improves.  Tobias Alexander, MD 08/12/2019

## 2019-08-12 NOTE — CV Procedure (Signed)
INDICATIONS: stroke  PROCEDURE:   Informed consent was obtained prior to the procedure. The risks, benefits and alternatives for the procedure were discussed and the patient comprehended these risks.  Risks include, but are not limited to, cough, sore throat, vomiting, nausea, somnolence, esophageal and stomach trauma or perforation, bleeding, low blood pressure, aspiration, pneumonia, infection, trauma to the teeth and death.    After a procedural time-out, the oropharynx was anesthetized with 20% benzocaine spray.   During this procedure the patient was administered propofol per anesthesia for monitored deep sedation.  The patient's heart rate, blood pressure, and oxygen saturation were monitored continuously during the procedure. The period of conscious sedation was 30 minutes, of which I was present face-to-face 100% of this time.  The transesophageal probe was inserted in the esophagus and stomach without difficulty and multiple views were obtained.  The patient was kept under observation until the patient left the procedure room.  The patient left the procedure room in stable condition.   Agitated microbubble saline contrast was administered.  COMPLICATIONS:    There were no immediate complications.  FINDINGS:  Tiny PFO with right to left shunt with agitated saline Mild MR LVEF 25%  RECOMMENDATIONS:     ok to return to hospital room when alert  Time Spent Directly with the Patient:  45 minutes   Caral Whan A Edelmiro Innocent 08/12/2019, 2:15 PM

## 2019-08-12 NOTE — Consult Note (Addendum)
ELECTROPHYSIOLOGY CONSULT NOTE  Patient ID: David [name] MRN: [id number], DOB/AGE: 56-Jan-1967   Admit date: 08/09/2019 Date of Consult: 08/12/2019  Primary Physician: Patient, No Pcp Per Primary Cardiologist: Olga Millers, MD  Primary Electrophysiologist: New to Dr. Ladona Ridgel. Reason for Consultation: Cryptogenic stroke; recommendations regarding Implantable Loop Recorder Insurance: CIGNA/CIGNA Managed  History of Present Illness EP has been asked to evaluate David [name] for placement of an implantable loop recorder to monitor for atrial fibrillation by Dr  Pearlean Brownie .  The patient was admitted on 08/09/2019 with slurred speech and facial droop outside of TAP window.  They first developed symptoms while  at work .  Imaging demonstrated left MCA infarcts. They have undergone workup for stroke including echocardiogram and CTA Head and Neck which showed patent common and internal carotid arteries bilaterally within the neck. The patient has been monitored on telemetry which has demonstrated sinus rhythm with no arrhythmias.  Inpatient stroke work-up will require a TEE per Neurology.   Echocardiogram this admission demonstrated a new diagnosis of systolic HF with EF down to 20-25%. Gen cards consulted for HF work up and follow up.  Lab work is reviewed.  Prior to admission, the patient denies shortness of breath, dizziness, palpitations, or syncope.  They are recovering from their stroke with plans to return home  at discharge.  Past Medical History:  Diagnosis Date   Acute ischemic left MCA stroke (HCC)    a. 08/2019 MRI: Acute L MCA branch vessel infarctions; b. 08/2019 CTA head/neck: Patent bilat carotids/vertebrals. High grade stenosis - inf division of prox L MCA M2 branch. additional high-grade mid-dist superior L MCA M2 branch occlusion. 7mm aneurysm arising from cavernous L ICA.   Cardiomyopathy (HCC)    a. 08/2019 Echo: EF 20-25%, global HK. Gr2 DD. Nl RV fxn. RVSP 30.14mmHg.  Mildly dil  LA. Mod MR.   Cerebral aneurysm    a. 08/2019 CTA Head: 7mm aneurysm arising from the cavernous L ICA.   Facial mass    a. CT head: 7.0 x 2.3 cm soft tissue mass along lat aspect of L temporal bone, involving L ext ear - highly suscpicious for malignancy.   Moderate mitral regurgitation    Tobacco abuse      Surgical History:  Past Surgical History:  Procedure Laterality Date   EXTERNAL EAR SURGERY       Medications Prior to Admission  Medication Sig Dispense Refill Last Dose   acetaminophen (TYLENOL) 325 MG tablet Take 2 tablets (650 mg total) by mouth every 6 (six) hours as needed for mild pain (or temp > 100). (Patient not taking: Reported on 08/09/2019)   Not Taking at Unknown time   ibuprofen (ADVIL,MOTRIN) 600 MG tablet Take 1 tablet (600 mg total) by mouth every 6 (six) hours as needed. (Patient not taking: Reported on 08/09/2019) 30 tablet 0 Not Taking at Unknown time   ondansetron (ZOFRAN) 4 MG tablet Take 1 tablet (4 mg total) by mouth every 6 (six) hours. (Patient not taking: Reported on 08/09/2019) 12 tablet 0 Not Taking at Unknown time   oxyCODONE-acetaminophen (PERCOCET/ROXICET) 5-325 MG tablet Take 1 tablet by mouth every 6 (six) hours as needed for severe pain. (Patient not taking: Reported on 08/09/2019) 3 tablet 0 Not Taking at Unknown time    Inpatient Medications:   aspirin EC  81 mg Oral Daily   atorvastatin  80 mg Oral q1800   carvedilol  3.125 mg Oral BID WC   clopidogrel  75  mg Oral Daily   enoxaparin (LOVENOX) injection  40 mg Subcutaneous Q24H   LORazepam  1 mg Intravenous Once   losartan  25 mg Oral Daily    Allergies: No Known Allergies  Social History   Socioeconomic History   Marital status: Single    Spouse name: Not on file   Number of children: Not on file   Years of education: Not on file   Highest education level: Not on file  Occupational History   Not on file  Tobacco Use   Smoking status: Current Every Day Smoker    Packs/day: 0.50     Years: 35.00    Pack years: 17.50    Types: Cigarettes   Smokeless tobacco: Never Used  Substance and Sexual Activity   Alcohol use: No   Drug use: Yes    Types: Marijuana    Comment: smokes several blunts on the weekend.   Sexual activity: Yes  Other Topics Concern   Not on file  Social History Narrative   Lives in [place] w/ fiancee.  Does not routinely exercise but has very busy work-life, working M-F 12 hrs shifts and another 8 hrs on Saturdays.  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   Social Determinants of Health   Financial Resource Strain:    Difficulty of Paying Living Expenses: Not on file  Food Insecurity:    Worried About Programme researcher, broadcasting/film/video in the Last Year: Not on file   [employer] of Food in the Last Year: Not on file  Transportation Needs:    Lack of Transportation (Medical): Not on file   Lack of Transportation (Non-Medical): Not on file  Physical Activity:    Days of Exercise per Week: Not on file   Minutes of Exercise per Session: Not on file  Stress:    Feeling of Stress : Not on file  Social Connections:    Frequency of Communication with Friends and Family: Not on file   Frequency of Social Gatherings with Friends and Family: Not on file   Attends Religious Services: Not on file   Active Member of Clubs or Organizations: Not on file   Attends Banker Meetings: Not on file   Marital Status: Not on file  Intimate Partner Violence:    Fear of Current or Ex-Partner: Not on file   Emotionally Abused: Not on file   Physically Abused: Not on file   Sexually Abused: Not on file     Family History  Problem Relation Age of Onset   Cancer Mother        died @ 30   Other Father        died of CO poisoning as a young man.   Stroke Sister       Review of Systems: All other systems reviewed and are otherwise negative except as noted above.  Physical Exam: Vitals:   08/11/19 1613 08/11/19 2033 08/12/19 0022 08/12/19 0316    BP: 137/85 128/81 126/85 (!) 144/88  Pulse: 70 (!) 59 61 63  Resp: Temp: 98.3 F (36.8 C) 98.3 F (36.8 C) 98.2 F (36.8 C) 97.9 F (36.6 C)  TempSrc: Oral Oral Oral Oral  SpO2: 100% 97% 100% 99%    GEN- The patient is well appearing, alert and oriented x 3 today.   Head- normocephalic, atraumatic Eyes-  Sclera clear, conjunctiva pink Ears- hearing intact Oropharynx- clear Neck- supple Lungs- Clear to  ausculation bilaterally, normal work of breathing Heart- Regular rate and rhythm, no murmurs, rubs or gallops  GI- soft, NT, ND, + BS Extremities- no clubbing, cyanosis, or edema MS- no significant deformity or atrophy Skin- no rash or lesion Psych- euthymic mood, full affect   Labs:   Lab Results  Component Value Date   WBC 6.9 08/12/2019   HGB 15.8 08/12/2019   HCT 46.6 08/12/2019   MCV 93.6 08/12/2019   PLT 227 08/12/2019    Recent Labs  Lab 08/09/19 1737 08/09/19 1738 08/12/19 0435  NA 139   < > 140  K 3.6   < > 4.0  CL 104   < > 105  CO2 26   < > 26  BUN 10   < > 14  CREATININE 1.19   < > 1.20  CALCIUM 9.0   < > 9.1  PROT 7.3  --   --   BILITOT 0.9  --   --   ALKPHOS 84  --   --   ALT 13  --   --   AST 23  --   --   GLUCOSE 122*   < > 101*   < > = values in this interval not displayed.     Radiology/Studies: CT Code Stroke CTA Head W/WO contrast  Addendum Date: 08/09/2019   ADDENDUM REPORT: 08/09/2019 18:31 ADDENDUM: Please note there is also a moderate to severe stenosis within the A1 right anterior cerebral artery (series 8, image 21) (series 9, image 17). These results were called by telephone at the time of interpretation on 08/09/2019 at 6:31 pm to provider Dr. Laurence Slate, who verbally acknowledged these results. Electronically Signed   By: Jackey Loge DO   On: 08/09/2019 18:31   Result Date: 08/09/2019 CLINICAL DATA:  Stroke code. EXAM: CT ANGIOGRAPHY HEAD TECHNIQUE: Multidetector CT imaging of the head was performed using the standard  protocol during bolus administration of intravenous contrast. Multiplanar CT image reconstructions and MIPs were obtained to evaluate the vascular anatomy. CONTRAST:  40mL OMNIPAQUE IOHEXOL 350 MG/ML SOLN COMPARISON:  Noncontrast head CT performed earlier the same day. FINDINGS: Please refer to full dictation of the CTA head/neck and CT perfusion under the CT angiogram neck examination. IMPRESSION: Please refer to full dictation of the CTA head/neck and CT perfusion under the CT angiogram neck examination. Electronically Signed: By: Jackey Loge DO On: 08/09/2019 18:18   CT Code Stroke CTA Neck W/WO contrast  Addendum Date: 08/09/2019   ADDENDUM REPORT: 08/09/2019 22:14 ADDENDUM: Please note there is also a moderate to severe stenosis within the A1 right anterior cerebral artery (series 8, image 21) (series 9, image 17). These results were called by telephone at the time of interpretation on 08/09/2019 at 6:31 pm to provider Dr. Laurence Slate, who verbally acknowledged these results. Electronically Signed   By: Jackey Loge DO   On: 08/09/2019 22:14   Result Date: 08/09/2019 CLINICAL DATA:  Stroke code. Focal neuro deficit, greater than 6 hours, stroke suspected. EXAM: CT ANGIOGRAPHY HEAD AND NECK CT PERFUSION BRAIN TECHNIQUE: Multidetector CT imaging of the head and neck was performed using the standard protocol during bolus administration of intravenous contrast. Multiplanar CT image reconstructions and MIPs were obtained to evaluate the vascular anatomy. Carotid stenosis measurements (when applicable) are obtained utilizing NASCET criteria, using the distal internal carotid diameter as the denominator. Multiphase CT imaging of the brain was performed following IV bolus contrast injection. Subsequent parametric perfusion maps were calculated using RAPID  software. CONTRAST:  Administered contrast not known at this time COMPARISON:  Noncontrast head CT performed earlier the same day. FINDINGS: CTA NECK FINDINGS Aortic  arch: Standard aortic branching. No significant innominate or proximal subclavian artery stenosis Right carotid system: CCA and ICA patent within the neck without stenosis. Mild mixed plaque within the carotid bulb. Left carotid system: CCA and ICA patent within the neck without stenosis. Vertebral arteries: Codominant. Patent within the neck without stenosis Skeleton: No acute bony abnormality. Cervical spondylosis with multilevel posterior disc osteophytes, uncovertebral and facet hypertrophy. Other neck: No neck mass or cervical lymphadenopathy. Upper chest: No consolidation within the imaged lung apices. Bullous emphysema. Review of the MIP images confirms the above findings CTA HEAD FINDINGS Anterior circulation: The intracranial internal carotid arteries are patent without high-grade stenosis. 7 mm annular is arising from the cavernous left ICA (series 5, image 124). The M1 right middle cerebral artery is patent without significant stenosis. No right M2 proximal branch occlusion or high-grade proximal stenosis is identified. The M1 left middle cerebral artery is patent without significant stenosis. No definite M2 proximal branch occlusion is identified. There is a high-grade focal stenosis within a proximal inferior division left M2 branch (series 10, image 12). There is also a high-grade stenosis within a mid to distal superior division M2 branch (series 10, image 12). The anterior cerebral arteries are patent without high-grade proximal stenosis. Posterior circulation: The intracranial right vertebral artery is dominant and patent without significant stenosis. The intracranial left vertebral artery is developmentally diminutive and markedly irregular, but patent. The basilar artery is patent without significant stenosis. The bilateral posterior cerebral arteries are patent without significant proximal stenosis. Posterior communicating arteries are poorly delineated and may be hypoplastic or absent  bilaterally. Venous sinuses: Within limitations of contrast timing, no convincing thrombus. Anatomic variants: As described Review of the MIP images confirms the above findings CT Brain Perfusion Findings: ASPECTS: 9 CBF (<30%) Volume: 31mL Perfusion (Tmax>6.0s) volume: 51mL Mismatch Volume: 27mL IMPRESSION: CTA neck: 1. The bilateral common and internal carotid arteries are patent within the neck without significant stenosis. 2. The vertebral arteries are patent within the neck without stenosis. CTA head: 1. No intracranial large vessel occlusion. No definite proximal M2 left MCA branch occlusion is identified. 2. There is a high-grade stenosis within an inferior division proximal left MCA M2 branch. Additional high-grade stenosis within the mid to distal superior division left MCA M2 branch. 3. 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 CT perfusion head: The perfusion software identifies no core infarct. The perfusion software identifies no critically hypoperfused parenchyma utilizing a Tmax>6 seconds threshold. However, there were clear changes of ischemic infarction within the mid left frontal lobe and left frontal operculum on noncontrast head CT performed immediately prior. Of note, there is a 9 mL region of hypoperfused parenchyma in this region when utilizing a Tmax > 4 seconds threshold. Electronically Signed: By: Kellie Simmering DO On: 08/09/2019 18:12   MR BRAIN WO CONTRAST  Result Date: 08/10/2019 CLINICAL DATA:  Right facial droop.  Follow-up acute stroke. EXAM: MRI HEAD WITHOUT CONTRAST TECHNIQUE: Multiplanar, multiecho pulse sequences of the brain and surrounding structures were obtained without intravenous contrast. COMPARISON:  CT studies 08/09/2019 FINDINGS: Brain: No abnormality seen affecting the brainstem, cerebellum or right cerebral hemisphere. In the left cerebral hemisphere, there is a confluent acute infarction measuring 3 x 3 x 1.5 cm within the left frontal operculum. Few other  punctate foci of acute infarction posterior to that  in the left posterior frontal and frontoparietal junction regions. No evidence of mass effect or hemorrhage. Findings are consistent with left MCA territory embolic infarctions. Vascular: Major vessels at the base of the brain show flow. Skull and upper cervical spine: Negative Sinuses/Orbits: Inflammatory rhinosinusitis, most pronounced affecting the left maxillary sinus. Other: None IMPRESSION: Acute branch vessel infarctions in the left MCA territory. Largest infarction is a 3 x 3 x 1.5 cm left frontal operculum cortical and subcortical infarction. Few other punctate foci of acute infarction posterior to that at the left posterior frontal vertex and frontoparietal vertex. No mass effect. No blood products. Remainder of the brain is normal. Electronically Signed   By: Paulina Fusi M.D.   On: 08/10/2019 02:55   CT Code Stroke Cerebral Perfusion with contrast  Result Date: 08/09/2019 CLINICAL DATA:  Stroke code. EXAM: CT PERFUSION BRAIN TECHNIQUE: Multiphase CT imaging of the brain was performed following IV bolus contrast injection. Subsequent parametric perfusion maps were calculated using RAPID software. CONTRAST:  44mL OMNIPAQUE IOHEXOL 350 MG/ML SOLN COMPARISON:  Noncontrast head CT performed earlier the same day. FINDINGS: Please refer to complete dictation for the CTA head/neck and CT perfusion under the CTA neck dictation. IMPRESSION: Please refer to complete dictation for the CTA head/neck and CT perfusion under the CTA neck dictation. Electronically Signed   By: Jackey Loge DO   On: 08/09/2019 18:19   ECHOCARDIOGRAM COMPLETE  Result Date: 08/10/2019    ECHOCARDIOGRAM REPORT   Patient Name:   GEORGIO GO [facility] Date of Exam: 08/10/2019 Medical Rec #:  [id number]       Height:       71.0 in Accession #:    [id number]      Weight:       165.0 lb Date of Birth:  17-Aug-1965       BSA:          1.943 m Patient Age:    53 years        BP:           146/85 mmHg  Patient Gender: M               HR:           61 bpm. Exam Location:  Inpatient Procedure: 2D Echo Indications:    Stroke 434.91 / I163.9  History:        Patient has no prior history of Echocardiogram examinations.                 Risk Factors:Tobacco abuse.  Sonographer:    Leeroy Bock Turrentine Referring Phys: [id number] VISHAL R PATEL IMPRESSIONS  1. Left ventricular ejection fraction, by estimation, is 20 to 25%. The left ventricle has severely decreased function. The left ventricle demonstrates global hypokinesis. The left ventricular internal cavity size was moderately dilated. Left ventricular diastolic parameters are consistent with Grade II diastolic dysfunction (pseudonormalization). Elevated left atrial pressure.  2. Right ventricular systolic function is normal. The right ventricular size is normal. There is mildly elevated pulmonary artery systolic pressure. The estimated right ventricular systolic pressure is 30.2 mmHg.  3. Left atrial size was mildly dilated.  4. The mitral valve is normal in structure and function. Moderate mitral valve regurgitation.  5. The aortic valve is normal in structure and function. Aortic valve regurgitation is not visualized. Comparison(s): No prior Echocardiogram. Conclusion(s)/Recommendation(s): No intracardiac source of embolism detected on this transthoracic study. Specifically, no left ventricular thrombus is seen, but the left ventricular syst olic  function is severely decreased, thereby increasing the risk of intracardiac thrombus. FINDINGS  Left Ventricle: Left ventricular ejection fraction, by estimation, is 20 to 25%. The left ventricle has severely decreased function. The left ventricle demonstrates global hypokinesis. The left ventricular internal cavity size was moderately dilated. There is no left ventricular hypertrophy. Left ventricular diastolic parameters are consistent with Grade II diastolic dysfunction (pseudonormalization). Elevated left atrial pressure.  Right Ventricle: The right ventricular size is normal. No increase in right ventricular wall thickness. Right ventricular systolic function is normal. There is mildly elevated pulmonary artery systolic pressure. The tricuspid regurgitant velocity is 2.61  m/s, and with an assumed right atrial pressure of 3 mmHg, the estimated right ventricular systolic pressure is 30.2 mmHg. Left Atrium: Left atrial size was mildly dilated. Right Atrium: Right atrial size was normal in size. Pericardium: There is no evidence of pericardial effusion. Mitral Valve: The mitral valve is normal in structure and function. Moderate mitral valve regurgitation, with centrally-directed jet. Tricuspid Valve: The tricuspid valve is normal in structure. Tricuspid valve regurgitation is trivial. Aortic Valve: The aortic valve is normal in structure and function. Aortic valve regurgitation is not visualized. Pulmonic Valve: The pulmonic valve was normal in structure. Pulmonic valve regurgitation is not visualized. Aorta: The aortic root and ascending aorta are structurally normal, with no evidence of dilitation. IAS/Shunts: No atrial level shunt detected by color flow Doppler.  LEFT VENTRICLE PLAX 2D LVIDd:         6.87 cm      Diastology LVIDs:         6.09 cm      LV e' lateral:   4.62 cm/s LV PW:         0.96 cm      LV E/e' lateral: 14.6 LV IVS:        0.98 cm      LV e' medial:    5.11 cm/s LVOT diam:     1.90 cm      LV E/e' medial:  13.2 LV SV:         36 LV SV Index:   19 LVOT Area:     2.84 cm  LV Volumes (MOD) LV vol d, MOD A2C: 139.0 ml LV vol d, MOD A4C: 248.0 ml LV vol s, MOD A2C: 94.5 ml LV vol s, MOD A4C: 186.0 ml LV SV MOD A2C:     44.5 ml LV SV MOD A4C:     248.0 ml LV SV MOD BP:      57.3 ml RIGHT VENTRICLE RV S prime:     15.20 cm/s TAPSE (M-mode): 2.2 cm LEFT ATRIUM             Index       RIGHT ATRIUM           Index LA diam:        4.20 cm 2.16 cm/m  RA Area:     16.30 cm LA Vol (A2C):   43.6 ml 22.44 ml/m RA Volume:    40.90 ml  21.05 ml/m LA Vol (A4C):   74.7 ml 38.45 ml/m LA Biplane Vol: 59.7 ml 30.73 ml/m  AORTIC VALVE LVOT Vmax:   76.40 cm/s LVOT Vmean:  54.400 cm/s LVOT VTI:    0.128 m  AORTA Ao Root diam: 3.10 cm MITRAL VALVE               TRICUSPID VALVE MV Area (PHT): 3.31 cm    TR Peak grad:  27.2 mmHg MV Decel Time: 229 msec    TR Vmax:        261.00 cm/s MV E velocity: 67.30 cm/s MV A velocity: 57.80 cm/s  SHUNTS MV E/A ratio:  1.16        Systemic VTI:  0.13 m                            Systemic Diam: 1.90 cm Rachelle Hora Croitoru MD Electronically signed by Thurmon Fair MD Signature Date/Time: 08/10/2019/3:56:16 PM    Final    CT HEAD CODE STROKE WO CONTRAST  Addendum Date: 08/09/2019   ADDENDUM REPORT: 08/09/2019 22:25 ADDENDUM: Findings omitted from the impression. 7.0 x 2.3 cm cutaneous/subcutaneous soft tissue mass along the lateral aspect of the left temporal bone, involving the left external ear, highly suspicious for malignancy. This finding was discussed with Dr. Laurence Slate at the time of the original dictation at 5:54 p.m. on 08/09/19. Electronically Signed   By: Jackey Loge DO   On: 08/09/2019 22:25   Addendum Date: 08/09/2019   ADDENDUM REPORT: 08/09/2019 17:57 ADDENDUM: These results were called by telephone at the time of interpretation on 08/09/2019 at 5:54 pm to provider Dr. Laurence Slate, who verbally acknowledged these results. Electronically Signed   By: Jackey Loge DO   On: 08/09/2019 17:57   Result Date: 08/09/2019 CLINICAL DATA:  Code stroke.  Aphasia EXAM: CT HEAD WITHOUT CONTRAST TECHNIQUE: Contiguous axial images were obtained from the base of the skull through the vertex without intravenous contrast. COMPARISON:  No pertinent prior studies available for comparison. FINDINGS: Brain: There is no evidence of acute intracranial hemorrhage. There is abnormal hypodensity consistent with acute/early subacute infarction involving the mid left frontal lobe and left frontal operculum. There is no significant mass  effect. No evidence of intracranial mass. No midline shift or extra-axial fluid collection. Vascular: No hyperdense vessel is identified. Skull: Normal. Negative for fracture or focal lesion. Sinuses/Orbits: Visualized orbits demonstrate no acute abnormality. Paranasal sinus disease. Most notably, there is near complete opacification left maxillary sinus. Right maxillary sinus mucous retention cyst. Scattered opacification of bilateral ethmoid air cells. No significant mastoid effusion. Other: There is a cutaneous/subcutaneous mass along the lateral aspect of the left temporal bone and involving the external ear which measures 7.0 x 2.3 cm in transaxial dimensions, likely reflecting malignancy (series 3, image 9) ASPECTS ([place] Stroke Program Early CT Score) - Ganglionic level infarction (caudate, lentiform nuclei, internal capsule, insula, M1-M3 cortex): Seven - Supraganglionic infarction (M4-M6 cortex): 2 (point adducted for involvement of the left M5 territory) Total score (0-10 with 10 being normal):  9 IMPRESSION: Acute/early subacute ischemic infarction within the mid left frontal lobe and left frontal operculum. No evidence of hemorrhagic conversion. No significant mass effect. ASPECTS 9 Electronically Signed: By: Jackey Loge DO On: 08/09/2019 17:48    12-lead ECG 08/09/2019 shows NSR at 80 bpm (personally reviewed) All prior EKG's in EPIC reviewed with no documented atrial fibrillation  Telemetry NSR/Sinus tach 90-100s with short run of SVT overnight (personally reviewed)  Assessment and Plan:  1. Cryptogenic stroke The patient presents with cryptogenic stroke.  The patient does have a TEE planned for this AM.  At this time, the patient is not a loop candidate due to newly diagnosed systolic heart failure, and potential for ICD consideration if EF does not improve on GDMT. We recommend monitoring with an event monitor, and further follow up after GDMT titrated and Echo  repeated to consider for  ICD if EF does not improve. He has agree to wear an event monitor with follow up in 5-6 weeks to discuss.  He will need follow up with general cardiology for management of his HF in the meantime.   2. New systolic CHF Unclear etiology. At least 1 previous episode of chest pain. Per notes plan for ischemic work up as outpatient once recovered from stroke.  3. Left facial/cranial mass 7 x 2.3 cutaneous/subQ soft tissue mass along left temporal bone involving the left external ear noted on CT.  Per patient and fiancee he suffered trauma to his ear years ago and recently had reconstructive surgery.   Graciella Freer, PA-C 08/12/2019 7:43 AM  EP Attending  Patient seen and examined. Agree with the findings as noted above. The patient has had a cryptogenic stroke and has been found to have LV dysfunction. He has not been on medical therapy. His girlfriend notes that he c/o palpitations about a week ago. He has not had documented syncope or atrial fib. With his LV dysfunction, I think it reasonable to begin guideline directed medical therapy and wear a 30 day monitor. If no atrial fib, would repeat echo after 3-4 months of medical therapy. If his EF does not improve, we would placed a DDD ICD and if his EF improves and he does not need an ICD, the an ILR would be recommended.  Leonia Reeves.D.

## 2019-08-12 NOTE — Progress Notes (Signed)
STROKE TEAM PROGRESS NOTE   INTERVAL HISTORY Wife at bedside. Pt sitting in bed, drinking coffee. He had TEE today showed EF 25% with tiny PFO, no thrombus seen. Pt is willing to quit smoking and THC. Answered all their questions.    OBJECTIVE Vitals:   08/12/19 1301 08/12/19 1419 08/12/19 1430 08/12/19 1549  BP: (!) 162/88 111/76 122/80 115/74  Pulse: 75 (!) 110 94 68  Resp: (!) 21 14 17 18   Temp: 98.5 F (36.9 C) 97.7 F (36.5 C)  98.7 F (37.1 C)  TempSrc: Oral Axillary  Oral  SpO2: 97% 98% 96% 98%  Weight: 74.8 kg     Height: 5\' 11"  (1.803 m)       CBC:  Recent Labs  Lab 08/09/19 1737 08/09/19 1738 08/11/19 0243 08/12/19 0435  WBC 7.1   < > 5.6 6.9  NEUTROABS 4.3  --   --   --   HGB 14.6   < > 15.3 15.8  HCT 42.7   < > 45.5 46.6  MCV 94.1   < > 93.0 93.6  PLT 213   < > 210 227   < > = values in this interval not displayed.    Basic Metabolic Panel:  Recent Labs  Lab 08/11/19 0243 08/12/19 0435  NA 138 140  K 3.6 4.0  CL 105 105  CO2 25 26  GLUCOSE 107* 101*  BUN 9 14  CREATININE 1.17 1.20  CALCIUM 8.9 9.1    Lipid Panel:     Component Value Date/Time   CHOL 149 08/10/2019 0439   TRIG 91 08/10/2019 0439   HDL 34 (L) 08/10/2019 0439   CHOLHDL 4.4 08/10/2019 0439   VLDL 18 08/10/2019 0439   LDLCALC 97 08/10/2019 0439   HgbA1c:  Lab Results  Component Value Date   HGBA1C 5.1 08/10/2019   Urine Drug Screen:     Component Value Date/Time   LABOPIA NONE DETECTED 08/09/2019 2118   COCAINSCRNUR NONE DETECTED 08/09/2019 2118   LABBENZ NONE DETECTED 08/09/2019 2118   AMPHETMU NONE DETECTED 08/09/2019 2118   THCU POSITIVE (A) 08/09/2019 2118   LABBARB NONE DETECTED 08/09/2019 2118    Alcohol Level     Component Value Date/Time   ETH <10 08/09/2019 1737    IMAGING CT Code Stroke CTA Head W/WO contrast  Addendum Date: 08/09/2019   ADDENDUM REPORT: 08/09/2019 18:31 ADDENDUM: Please note there is also a moderate to severe stenosis within the A1  right anterior cerebral artery (series 8, image 21) (series 9, image 17). These results were called by telephone at the time of interpretation on 08/09/2019 at 6:31 pm to provider Dr. 10/09/2019, who verbally acknowledged these results. Electronically Signed   By: 10/09/2019 DO   On: 08/09/2019 18:31   Result Date: 08/09/2019 CLINICAL DATA:  Stroke code. EXAM: CT ANGIOGRAPHY HEAD TECHNIQUE: Multidetector CT imaging of the head was performed using the standard protocol during bolus administration of intravenous contrast. Multiplanar CT image reconstructions and MIPs were obtained to evaluate the vascular anatomy. CONTRAST:  82mL OMNIPAQUE IOHEXOL 350 MG/ML SOLN COMPARISON:  Noncontrast head CT performed earlier the same day. FINDINGS: Please refer to full dictation of the CTA head/neck and CT perfusion under the CT angiogram neck examination. IMPRESSION: Please refer to full dictation of the CTA head/neck and CT perfusion under the CT angiogram neck examination. Electronically Signed: By: 10/09/2019 DO On: 08/09/2019 18:18   CT Code Stroke CTA Neck W/WO contrast  Addendum Date:  08/09/2019   ADDENDUM REPORT: 08/09/2019 22:14 ADDENDUM: Please note there is also a moderate to severe stenosis within the A1 right anterior cerebral artery (series 8, image 21) (series 9, image 17). These results were called by telephone at the time of interpretation on 08/09/2019 at 6:31 pm to provider Dr. Lorraine Lax, who verbally acknowledged these results. Electronically Signed   By: Kellie Simmering DO   On: 08/09/2019 22:14   Result Date: 08/09/2019 CLINICAL DATA:  Stroke code. Focal neuro deficit, greater than 6 hours, stroke suspected. EXAM: CT ANGIOGRAPHY HEAD AND NECK CT PERFUSION BRAIN TECHNIQUE: Multidetector CT imaging of the head and neck was performed using the standard protocol during bolus administration of intravenous contrast. Multiplanar CT image reconstructions and MIPs were obtained to evaluate the vascular anatomy. Carotid  stenosis measurements (when applicable) are obtained utilizing NASCET criteria, using the distal internal carotid diameter as the denominator. Multiphase CT imaging of the brain was performed following IV bolus contrast injection. Subsequent parametric perfusion maps were calculated using RAPID software. CONTRAST:  Administered contrast not known at this time COMPARISON:  Noncontrast head CT performed earlier the same day. FINDINGS: CTA NECK FINDINGS Aortic arch: Standard aortic branching. No significant innominate or proximal subclavian artery stenosis Right carotid system: CCA and ICA patent within the neck without stenosis. Mild mixed plaque within the carotid bulb. Left carotid system: CCA and ICA patent within the neck without stenosis. Vertebral arteries: Codominant. Patent within the neck without stenosis Skeleton: No acute bony abnormality. Cervical spondylosis with multilevel posterior disc osteophytes, uncovertebral and facet hypertrophy. Other neck: No neck mass or cervical lymphadenopathy. Upper chest: No consolidation within the imaged lung apices. Bullous emphysema. Review of the MIP images confirms the above findings CTA HEAD FINDINGS Anterior circulation: The intracranial internal carotid arteries are patent without high-grade stenosis. 7 mm annular is arising from the cavernous left ICA (series 5, image 124). The M1 right middle cerebral artery is patent without significant stenosis. No right M2 proximal branch occlusion or high-grade proximal stenosis is identified. The M1 left middle cerebral artery is patent without significant stenosis. No definite M2 proximal branch occlusion is identified. There is a high-grade focal stenosis within a proximal inferior division left M2 branch (series 10, image 12). There is also a high-grade stenosis within a mid to distal superior division M2 branch (series 10, image 12). The anterior cerebral arteries are patent without high-grade proximal stenosis.  Posterior circulation: The intracranial right vertebral artery is dominant and patent without significant stenosis. The intracranial left vertebral artery is developmentally diminutive and markedly irregular, but patent. The basilar artery is patent without significant stenosis. The bilateral posterior cerebral arteries are patent without significant proximal stenosis. Posterior communicating arteries are poorly delineated and may be hypoplastic or absent bilaterally. Venous sinuses: Within limitations of contrast timing, no convincing thrombus. Anatomic variants: As described Review of the MIP images confirms the above findings CT Brain Perfusion Findings: ASPECTS: 9 CBF (<30%) Volume: 58mL Perfusion (Tmax>6.0s) volume: 68mL Mismatch Volume: 13mL IMPRESSION: CTA neck: 1. The bilateral common and internal carotid arteries are patent within the neck without significant stenosis. 2. The vertebral arteries are patent within the neck without stenosis. CTA head: 1. No intracranial large vessel occlusion. No definite proximal M2 left MCA branch occlusion is identified. 2. There is a high-grade stenosis within an inferior division proximal left MCA M2 branch. Additional high-grade stenosis within the mid to distal superior division left MCA M2 branch. 3. 7 mm aneurysm arising from the cavernous left  ICA. CT perfusion head: The perfusion software identifies no core infarct. The perfusion software identifies no critically hypoperfused parenchyma utilizing a Tmax>6 seconds threshold. However, there were clear changes of ischemic infarction within the mid left frontal lobe and left frontal operculum on noncontrast head CT performed immediately prior. Of note, there is a 9 mL region of hypoperfused parenchyma in this region when utilizing a Tmax > 4 seconds threshold. Electronically Signed: By: Jackey Loge DO On: 08/09/2019 18:12   MR BRAIN WO CONTRAST  Result Date: 08/10/2019 CLINICAL DATA:  Right facial droop.  Follow-up  acute stroke. EXAM: MRI HEAD WITHOUT CONTRAST TECHNIQUE: Multiplanar, multiecho pulse sequences of the brain and surrounding structures were obtained without intravenous contrast. COMPARISON:  CT studies 08/09/2019 FINDINGS: Brain: No abnormality seen affecting the brainstem, cerebellum or right cerebral hemisphere. In the left cerebral hemisphere, there is a confluent acute infarction measuring 3 x 3 x 1.5 cm within the left frontal operculum. Few other punctate foci of acute infarction posterior to that in the left posterior frontal and frontoparietal junction regions. No evidence of mass effect or hemorrhage. Findings are consistent with left MCA territory embolic infarctions. Vascular: Major vessels at the base of the brain show flow. Skull and upper cervical spine: Negative Sinuses/Orbits: Inflammatory rhinosinusitis, most pronounced affecting the left maxillary sinus. Other: None IMPRESSION: Acute branch vessel infarctions in the left MCA territory. Largest infarction is a 3 x 3 x 1.5 cm left frontal operculum cortical and subcortical infarction. Few other punctate foci of acute infarction posterior to that at the left posterior frontal vertex and frontoparietal vertex. No mass effect. No blood products. Remainder of the brain is normal. Electronically Signed   By: Paulina Fusi M.D.   On: 08/10/2019 02:55   CT Code Stroke Cerebral Perfusion with contrast  Result Date: 08/09/2019 CLINICAL DATA:  Stroke code. EXAM: CT PERFUSION BRAIN TECHNIQUE: Multiphase CT imaging of the brain was performed following IV bolus contrast injection. Subsequent parametric perfusion maps were calculated using RAPID software. CONTRAST:  83mL OMNIPAQUE IOHEXOL 350 MG/ML SOLN COMPARISON:  Noncontrast head CT performed earlier the same day. FINDINGS: Please refer to complete dictation for the CTA head/neck and CT perfusion under the CTA neck dictation. IMPRESSION: Please refer to complete dictation for the CTA head/neck and CT  perfusion under the CTA neck dictation. Electronically Signed   By: Jackey Loge DO   On: 08/09/2019 18:19   ECHOCARDIOGRAM COMPLETE  Result Date: 08/10/2019    ECHOCARDIOGRAM REPORT   Patient Name:   [name] Date of Exam: 08/10/2019 Medical Rec #:  [id number]       Height:       71.0 in Accession #:    [id number]      Weight:       165.0 lb Date of Birth:  1966/05/06       BSA:          1.943 m Patient Age:    53 years        BP:           146/85 mmHg Patient Gender: M               HR:           61 bpm. Exam Location:  Inpatient Procedure: 2D Echo Indications:    Stroke 434.91 / I163.9  History:        Patient has no prior history of Echocardiogram examinations.  Risk Factors:Tobacco abuse.  Sonographer:    Leeroy Bock Turrentine Referring Phys: [id number] VISHAL R PATEL IMPRESSIONS  1. Left ventricular ejection fraction, by estimation, is 20 to 25%. The left ventricle has severely decreased function. The left ventricle demonstrates global hypokinesis. The left ventricular internal cavity size was moderately dilated. Left ventricular diastolic parameters are consistent with Grade II diastolic dysfunction (pseudonormalization). Elevated left atrial pressure.  2. Right ventricular systolic function is normal. The right ventricular size is normal. There is mildly elevated pulmonary artery systolic pressure. The estimated right ventricular systolic pressure is 30.2 mmHg.  3. Left atrial size was mildly dilated.  4. The mitral valve is normal in structure and function. Moderate mitral valve regurgitation.  5. The aortic valve is normal in structure and function. Aortic valve regurgitation is not visualized. Comparison(s): No prior Echocardiogram. Conclusion(s)/Recommendation(s): No intracardiac source of embolism detected on this transthoracic study. Specifically, no left ventricular thrombus is seen, but the left ventricular syst olic function is severely decreased, thereby increasing the risk of  intracardiac thrombus. FINDINGS  Left Ventricle: Left ventricular ejection fraction, by estimation, is 20 to 25%. The left ventricle has severely decreased function. The left ventricle demonstrates global hypokinesis. The left ventricular internal cavity size was moderately dilated. There is no left ventricular hypertrophy. Left ventricular diastolic parameters are consistent with Grade II diastolic dysfunction (pseudonormalization). Elevated left atrial pressure. Right Ventricle: The right ventricular size is normal. No increase in right ventricular wall thickness. Right ventricular systolic function is normal. There is mildly elevated pulmonary artery systolic pressure. The tricuspid regurgitant velocity is 2.61  m/s, and with an assumed right atrial pressure of 3 mmHg, the estimated right ventricular systolic pressure is 30.2 mmHg. Left Atrium: Left atrial size was mildly dilated. Right Atrium: Right atrial size was normal in size. Pericardium: There is no evidence of pericardial effusion. Mitral Valve: The mitral valve is normal in structure and function. Moderate mitral valve regurgitation, with centrally-directed jet. Tricuspid Valve: The tricuspid valve is normal in structure. Tricuspid valve regurgitation is trivial. Aortic Valve: The aortic valve is normal in structure and function. Aortic valve regurgitation is not visualized. Pulmonic Valve: The pulmonic valve was normal in structure. Pulmonic valve regurgitation is not visualized. Aorta: The aortic root and ascending aorta are structurally normal, with no evidence of dilitation. IAS/Shunts: No atrial level shunt detected by color flow Doppler.  LEFT VENTRICLE PLAX 2D LVIDd:         6.87 cm      Diastology LVIDs:         6.09 cm      LV e' lateral:   4.62 cm/s LV PW:         0.96 cm      LV E/e' lateral: 14.6 LV IVS:        0.98 cm      LV e' medial:    5.11 cm/s LVOT diam:     1.90 cm      LV E/e' medial:  13.2 LV SV:         36 LV SV Index:   19 LVOT  Area:     2.84 cm  LV Volumes (MOD) LV vol d, MOD A2C: 139.0 ml LV vol d, MOD A4C: 248.0 ml LV vol s, MOD A2C: 94.5 ml LV vol s, MOD A4C: 186.0 ml LV SV MOD A2C:     44.5 ml LV SV MOD A4C:     248.0 ml LV SV MOD BP:  57.3 ml RIGHT VENTRICLE RV S prime:     15.20 cm/s TAPSE (M-mode): 2.2 cm LEFT ATRIUM             Index       RIGHT ATRIUM           Index LA diam:        4.20 cm 2.16 cm/m  RA Area:     16.30 cm LA Vol (A2C):   43.6 ml 22.44 ml/m RA Volume:   40.90 ml  21.05 ml/m LA Vol (A4C):   74.7 ml 38.45 ml/m LA Biplane Vol: 59.7 ml 30.73 ml/m  AORTIC VALVE LVOT Vmax:   76.40 cm/s LVOT Vmean:  54.400 cm/s LVOT VTI:    0.128 m  AORTA Ao Root diam: 3.10 cm MITRAL VALVE               TRICUSPID VALVE MV Area (PHT): 3.31 cm    TR Peak grad:   27.2 mmHg MV Decel Time: 229 msec    TR Vmax:        261.00 cm/s MV E velocity: 67.30 cm/s MV A velocity: 57.80 cm/s  SHUNTS MV E/A ratio:  1.16        Systemic VTI:  0.13 m                            Systemic Diam: 1.90 cm Rachelle Hora Croitoru MD Electronically signed by Thurmon Fair MD Signature Date/Time: 08/10/2019/3:56:16 PM    Final    CT HEAD CODE STROKE WO CONTRAST  Addendum Date: 08/09/2019   ADDENDUM REPORT: 08/09/2019 22:25 ADDENDUM: Findings omitted from the impression. 7.0 x 2.3 cm cutaneous/subcutaneous soft tissue mass along the lateral aspect of the left temporal bone, involving the left external ear, highly suspicious for malignancy. This finding was discussed with Dr. Laurence Slate at the time of the original dictation at 5:54 p.m. on 08/09/19. Electronically Signed   By: Jackey Loge DO   On: 08/09/2019 22:25   Addendum Date: 08/09/2019   ADDENDUM REPORT: 08/09/2019 17:57 ADDENDUM: These results were called by telephone at the time of interpretation on 08/09/2019 at 5:54 pm to provider Dr. Laurence Slate, who verbally acknowledged these results. Electronically Signed   By: Jackey Loge DO   On: 08/09/2019 17:57   Result Date: 08/09/2019 CLINICAL DATA:  Code stroke.   Aphasia EXAM: CT HEAD WITHOUT CONTRAST TECHNIQUE: Contiguous axial images were obtained from the base of the skull through the vertex without intravenous contrast. COMPARISON:  No pertinent prior studies available for comparison. FINDINGS: Brain: There is no evidence of acute intracranial hemorrhage. There is abnormal hypodensity consistent with acute/early subacute infarction involving the mid left frontal lobe and left frontal operculum. There is no significant mass effect. No evidence of intracranial mass. No midline shift or extra-axial fluid collection. Vascular: No hyperdense vessel is identified. Skull: Normal. Negative for fracture or focal lesion. Sinuses/Orbits: Visualized orbits demonstrate no acute abnormality. Paranasal sinus disease. Most notably, there is near complete opacification left maxillary sinus. Right maxillary sinus mucous retention cyst. Scattered opacification of bilateral ethmoid air cells. No significant mastoid effusion. Other: There is a cutaneous/subcutaneous mass along the lateral aspect of the left temporal bone and involving the external ear which measures 7.0 x 2.3 cm in transaxial dimensions, likely reflecting malignancy (series 3, image 9) ASPECTS ([place]ber[place] Stroke Program Early CT Score) - Ganglionic level infarction (caudate, lentiform nuclei, internal capsule, insula, M1-M3 cortex): Seven - Supraganglionic  infarction (M4-M6 cortex): 2 (point adducted for involvement of the left M5 territory) Total score (0-10 with 10 being normal):  9 IMPRESSION: Acute/early subacute ischemic infarction within the mid left frontal lobe and left frontal operculum. No evidence of hemorrhagic conversion. No significant mass effect. ASPECTS 9 Electronically Signed: By: Jackey Loge DO On: 08/09/2019 17:48     PHYSICAL EXAM  Blood pressure 115/74, pulse 68, temperature 98.7 F (37.1 C), temperature source Oral, resp. rate 18, height 5\' 11"  (1.803 m), weight 74.8 kg, SpO2 98 %.   Pleasant  middle-aged African-American male not in distress. . Afebrile. Head is nontraumatic. Neck is supple without bruit.    Cardiac exam no murmur or gallop. Lungs are clear to auscultation. Distal pulses are well felt.  Neurological Exam ;  Awake  Alert oriented x 3. Normal speech and language without dysarthria. Eye movements full without nystagmus. Fundi were not visualized. Vision acuity and fields appear normal. PERRL, EOMI. Face symmetric. Tongue midline. Normal strength, tone, reflexes and coordination. Normal sensation. Gait deferred.   ASSESSMENT/PLAN Mr. ELLSWORTH CHARLEBOIS is a 54 y.o. male with history of tobacco and marijuana use presenting with slurred speech and  facial droop . He did not receive IV t-PA due to late presentation (>4.5 hours from time of onset)  Stroke - left MCA territory infarcts - high-grade stenosis within the left MCA M2 branch likely represents subtotally resolved embolus likely from new onset cardiomyopathy w/ low EF   Resultant mild speech difficulty  CT Head - Acute/early subacute ischemic infarction within the mid left frontal lobe and left frontal operculum. No evidence of hemorrhagic conversion. No significant mass effect. ASPECTS 9   CTA H&N - There is a high-grade stenosis left MCA M2 branches.   MRI head - Acute infarctions in the left MCA territory. Largest infarction is a 3 x 3 x 1.5 cm left frontal operculum cortical and subcortical infarction.   2D Echo - EF 20 to 25%  TEE - EF 25% with tiny PFO  Recommend 30 day cardiac event monitoring to rule out afib. Not recommend loop at this time in case pt needs ICD in the future  [employer] Virus 2 - negative  LDL - 97  HgbA1c - 5.1  UDS - positive for THCU  VTE prophylaxis - Lovenox  No antithrombotic prior to admission, now on Eliquis (apixaban) daily given low EF. Recommend to repeat TTE in 2-3 months, once EF > 30-35%, eliquis can be switched to ASA.   Therapy recommendations:   None  Disposition:  Pending Follow-up with Dr. Pearlean Brownie at Stroke Clinic at [facility] in 4 weeks. Office will call with appointment date and time. Order placed.  Cardiomyopathy / CHF  2D Echo -diminished ejection fraction 20 to 25%.   TEE EF 25%  Cardiology on board  Now on low dose coreg and cozaar  Recommend 30 day cardiac event monitoring to rule out afib - can arrange with Dr [name]  Plan OP eval w/ Dr. [name] and titrate CHF meds. Once fully titrated, will repeat 2D echo  Tiny PFO (patent foramen ovale)   TEE showed tiny PFO  Doubt any clinical significance  likely an incidental finding   Adding anticoagulation for low EF, no need to check LE dopplers  Hypertension  Home BP meds: none   Current meds for cardiomyopathy : coreg 3.125 bid, losartan 25  Stable . 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 Corliss Skains - will set up referral  Hx trauma to his L ear years ago w/ recent reconstructive sugery  Hospital day # 3  Neurology will sign off. Please call with questions. Pt will follow up with Dr. Pearlean Brownie at stroke clinic at [facility] in about 4 weeks. Thanks for the consult.   Marvel Plan, MD PhD Stroke Neurology 08/12/2019 4:55 PM  To contact Stroke Continuity provider, please refer to [email or web address]. After hours, contact General Neurology

## 2019-08-12 NOTE — Progress Notes (Signed)
PROGRESS NOTE    David [name]  VQQ:[id number] DOB: 07/22/1965 DOA: 08/09/2019 PCP: Patient, No Pcp Per     Brief Narrative:  54 year old man admitted from home on 3/5 with complaints of a speech abnormality and a facial droop.  His past medical history significant for tobacco abuse.  He has significant expressive aphasia.  He was admitted as a stroke outside of window for TPA and admission was requested for further evaluation and management.   Assessment & Plan:   Active Problems:   Acute ischemic stroke (HCC)   Tobacco abuse   Chronic systolic CHF (congestive heart failure) (HCC)   Mass of left temporal lobe   Acute ischemic CVA -CT head showed an acute/early subacute ischemic infarction within the mid left frontal lobe and left frontal operculum without evidence of hemorrhagic conversion. -MRI showed acute branch vessel infarctions in the left MCA territory.  Other punctate foci of acute infarction are visible. -CT Neck with patent carotid arteries bilaterally. -2D ECHO: EF 20-25% with global hypokinesis. Cardiology has been consulted. Plans are for a TEE. -Plan is for dual antiplatelet therapy for 3 weeks and then aspirin alone (unless this changes according to TEE results). -PT/OT/ST recommendations are pending. -LDL is 97, goal less than 70, started on Lipitor 40 mg. -Will need event monitor on DC.  Tobacco abuse -Counseled on cessation.  Chronic Systolic CHF -2D ECHO: EF 20-25% with global hypokinesis.  -Started on carvedilol and losartan. -Plans for ischemic work up as an OP once he has recovered from his acute CVA. (?coronary CT) -Appreciate cardiology input and recommendations.  Left Temporal Mass -Noted on CT scan. -Per patient and fiancee he suffered trauma to his ear years ago and recently had reconstructive surgery.   DVT prophylaxis: Lovenox Code Status: Full code Family Communication: Fianc at bedside updated on plan of care and all questions  answered. Disposition Plan: Home pending completed work up for stroke and new diagnosed systolic heart failure. Anticipate 24 hours.  Consultants:   Neurology  Procedures:   None  Antimicrobials:  Anti-infectives (From admission, onward)   None       Subjective: Lying in bed, no CP/SOB overnight. Frustrated about his NPO state.  Objective: Vitals:   08/12/19 0022 08/12/19 0316 08/12/19 0835 08/12/19 1143  BP: 126/85 (!) 144/88 124/78 119/72  Pulse: 61 63 71 72  Resp: 18 18 18 18   Temp: 98.2 F (36.8 C) 97.9 F (36.6 C) 98 F (36.7 C) 98.4 F (36.9 C)  TempSrc: Oral Oral Oral Oral  SpO2: 100% 99% 99% 98%    Intake/Output Summary (Last 24 hours) at 08/12/2019 1152 Last data filed at 08/12/2019 0800 Gross per 24 hour  Intake 0 ml  Output --  Net 0 ml   There were no vitals filed for this visit.  Examination:  General exam: Alert, awake, oriented x 3, some expressive aphasia noted Respiratory system: Clear to auscultation. Respiratory effort normal. Cardiovascular system:RRR. No murmurs, rubs, gallops. Gastrointestinal system: Abdomen is nondistended, soft and nontender. No organomegaly or masses felt. Normal bowel sounds heard. Central nervous system: Alert and oriented. No focal neurological deficits. Extremities: No C/C/E, +pedal pulses Skin: No rashes, lesions or ulcers Psychiatry: Judgement and insight appear normal. Mood & affect appropriate.     Data Reviewed: I have personally reviewed following labs and imaging studies  CBC: Recent Labs  Lab 08/09/19 1737 08/09/19 1738 08/11/19 0243 08/12/19 0435  WBC 7.1  --  5.6 6.9  NEUTROABS 4.3  --   --   --  HGB 14.6 14.6 15.3 15.8  HCT 42.7 43.0 45.5 46.6  MCV 94.1  --  93.0 93.6  PLT 213  --  210 227   Basic Metabolic Panel: Recent Labs  Lab 08/09/19 1737 08/09/19 1738 08/11/19 0243 08/12/19 0435  NA 139 141 138 140  K 3.6 3.5 3.6 4.0  CL 104 103 105 105  CO2 26  --  25 26  GLUCOSE 122*  118* 107* 101*  BUN 10 11 9 14   CREATININE 1.19 1.10 1.17 1.20  CALCIUM 9.0  --  8.9 9.1   GFR: CrCl cannot be calculated (Unknown ideal weight.). Liver Function Tests: Recent Labs  Lab 08/09/19 1737  AST 23  ALT 13  ALKPHOS 84  BILITOT 0.9  PROT 7.3  ALBUMIN 4.0   No results for input(s): LIPASE, AMYLASE in the last 168 hours. No results for input(s): AMMONIA in the last 168 hours. Coagulation Profile: Recent Labs  Lab 08/09/19 1812  INR 1.1   Cardiac Enzymes: No results for input(s): CKTOTAL, CKMB, CKMBINDEX, TROPONINI in the last 168 hours. BNP (last 3 results) No results for input(s): PROBNP in the last 8760 hours. HbA1C: Recent Labs    08/10/19 0439  HGBA1C 5.1   CBG: Recent Labs  Lab 08/09/19 1732  GLUCAP 130*   Lipid Profile: Recent Labs    08/10/19 0439  CHOL 149  HDL 34*  LDLCALC 97  TRIG 91  CHOLHDL 4.4   Thyroid Function Tests: No results for input(s): TSH, T4TOTAL, FREET4, T3FREE, THYROIDAB in the last 72 hours. Anemia Panel: No results for input(s): VITAMINB12, FOLATE, FERRITIN, TIBC, IRON, RETICCTPCT in the last 72 hours. Urine analysis:    Component Value Date/Time   COLORURINE STRAW (A) 08/09/2019 2117   APPEARANCEUR CLEAR 08/09/2019 2117   LABSPEC 1.034 (H) 08/09/2019 2117   PHURINE 6.0 08/09/2019 2117   GLUCOSEU NEGATIVE 08/09/2019 2117   HGBUR NEGATIVE 08/09/2019 2117   BILIRUBINUR NEGATIVE 08/09/2019 2117   KETONESUR NEGATIVE 08/09/2019 2117   PROTEINUR NEGATIVE 08/09/2019 2117   NITRITE NEGATIVE 08/09/2019 2117   LEUKOCYTESUR TRACE (A) 08/09/2019 2117   Sepsis Labs: @LABRCNTIP (procalcitonin:4,lacticidven:4)  ) Recent Results (from the past 240 hour(s))  SARS CORONAVIRUS 2 (TAT 6-24 HRS) Nasopharyngeal     Status: None   Collection Time: 08/09/19  9:09 PM   Specimen: Nasopharyngeal  Result Value Ref Range Status   SARS Coronavirus 2 NEGATIVE NEGATIVE Final    Comment: (NOTE) SARS-CoV-2 target nucleic acids are NOT  DETECTED. The SARS-CoV-2 RNA is generally detectable in upper and lower respiratory specimens during the acute phase of infection. Negative results do not preclude SARS-CoV-2 infection, do not rule out co-infections with other pathogens, and should not be used as the sole basis for treatment or other patient management decisions. Negative results must be combined with clinical observations, patient history, and epidemiological information. The expected result is Negative. Fact Sheet for Patients: Fact Sheet for Healthcare Providers: 10/09/19 This test is not yet approved or cleared by the [employer] FDA and  has been authorized for detection and/or diagnosis of SARS-CoV-2 by FDA under an Emergency Use Authorization (EUA). This EUA will remain  in effect (meaning this test can be used) for the duration of the COVID-19 declaration under Section 56 4(b)(1) of the Act, 21 U.S.C. section 360bbb-3(b)(1), unless the authorization is terminated or revoked sooner. Performed at [facility], [address] [place], [address] [place]   Culture, blood (Routine X 2) w Reflex to  ID Panel     Status: None (Preliminary result)   Collection Time: 08/10/19  9:55 AM   Specimen: BLOOD LEFT ARM  Result Value Ref Range Status   Specimen Description BLOOD LEFT ARM  Final   Special Requests   Final    BOTTLES DRAWN AEROBIC AND ANAEROBIC Blood Culture adequate volume   Culture   Final    NO GROWTH 2 DAYS Performed at [facility], [address], [place], [place] [address]    Report Status PENDING  Incomplete  Culture, blood (Routine X 2) w Reflex to ID Panel     Status: None (Preliminary result)   Collection Time: 08/10/19 10:03 AM   Specimen: BLOOD RIGHT ARM  Result Value Ref Range Status   Specimen Description BLOOD RIGHT ARM  Final   Special Requests   Final    BOTTLES DRAWN AEROBIC AND ANAEROBIC Blood  Culture adequate volume   Culture   Final    NO GROWTH 2 DAYS Performed at [facility], [address] [place], [place] [address]    Report Status PENDING  Incomplete         Radiology Studies: ECHOCARDIOGRAM COMPLETE  Result Date: 08/10/2019    ECHOCARDIOGRAM REPORT   Patient Name:   LAWRNCE REYEZ [facility] Date of Exam: 08/10/2019 Medical Rec #:  [id number]       Height:       71.0 in Accession #:    [id number]      Weight:       165.0 lb Date of Birth:  05/12/1966       BSA:          1.943 m Patient Age:    53 years        BP:           146/85 mmHg Patient Gender: M               HR:           61 bpm. Exam Location:  Inpatient Procedure: 2D Echo Indications:    Stroke 434.91 / I163.9  History:        Patient has no prior history of Echocardiogram examinations.                 Risk Factors:Tobacco abuse.  Sonographer:    Leeroy Bock Turrentine Referring Phys: [id number] VISHAL R PATEL IMPRESSIONS  1. Left ventricular ejection fraction, by estimation, is 20 to 25%. The left ventricle has severely decreased function. The left ventricle demonstrates global hypokinesis. The left ventricular internal cavity size was moderately dilated. Left ventricular diastolic parameters are consistent with Grade II diastolic dysfunction (pseudonormalization). Elevated left atrial pressure.  2. Right ventricular systolic function is normal. The right ventricular size is normal. There is mildly elevated pulmonary artery systolic pressure. The estimated right ventricular systolic pressure is 30.2 mmHg.  3. Left atrial size was mildly dilated.  4. The mitral valve is normal in structure and function. Moderate mitral valve regurgitation.  5. The aortic valve is normal in structure and function. Aortic valve regurgitation is not visualized. Comparison(s): No prior Echocardiogram. Conclusion(s)/Recommendation(s): No intracardiac source of embolism detected on this transthoracic study. Specifically, no left ventricular thrombus is seen,  but the left ventricular syst olic function is severely decreased, thereby increasing the risk of intracardiac thrombus. FINDINGS  Left Ventricle: Left ventricular ejection fraction, by estimation, is 20 to 25%. The left ventricle has severely decreased function. The left ventricle demonstrates global hypokinesis.  The left ventricular internal cavity size was moderately dilated. There is no left ventricular hypertrophy. Left ventricular diastolic parameters are consistent with Grade II diastolic dysfunction (pseudonormalization). Elevated left atrial pressure. Right Ventricle: The right ventricular size is normal. No increase in right ventricular wall thickness. Right ventricular systolic function is normal. There is mildly elevated pulmonary artery systolic pressure. The tricuspid regurgitant velocity is 2.61  m/s, and with an assumed right atrial pressure of 3 mmHg, the estimated right ventricular systolic pressure is 30.2 mmHg. Left Atrium: Left atrial size was mildly dilated. Right Atrium: Right atrial size was normal in size. Pericardium: There is no evidence of pericardial effusion. Mitral Valve: The mitral valve is normal in structure and function. Moderate mitral valve regurgitation, with centrally-directed jet. Tricuspid Valve: The tricuspid valve is normal in structure. Tricuspid valve regurgitation is trivial. Aortic Valve: The aortic valve is normal in structure and function. Aortic valve regurgitation is not visualized. Pulmonic Valve: The pulmonic valve was normal in structure. Pulmonic valve regurgitation is not visualized. Aorta: The aortic root and ascending aorta are structurally normal, with no evidence of dilitation. IAS/Shunts: No atrial level shunt detected by color flow Doppler.  LEFT VENTRICLE PLAX 2D LVIDd:         6.87 cm      Diastology LVIDs:         6.09 cm      LV e' lateral:   4.62 cm/s LV PW:         0.96 cm      LV E/e' lateral: 14.6 LV IVS:        0.98 cm      LV e' medial:    5.11  cm/s LVOT diam:     1.90 cm      LV E/e' medial:  13.2 LV SV:         36 LV SV Index:   19 LVOT Area:     2.84 cm  LV Volumes (MOD) LV vol d, MOD A2C: 139.0 ml LV vol d, MOD A4C: 248.0 ml LV vol s, MOD A2C: 94.5 ml LV vol s, MOD A4C: 186.0 ml LV SV MOD A2C:     44.5 ml LV SV MOD A4C:     248.0 ml LV SV MOD BP:      57.3 ml RIGHT VENTRICLE RV S prime:     15.20 cm/s TAPSE (M-mode): 2.2 cm LEFT ATRIUM             Index       RIGHT ATRIUM           Index LA diam:        4.20 cm 2.16 cm/m  RA Area:     16.30 cm LA Vol (A2C):   43.6 ml 22.44 ml/m RA Volume:   40.90 ml  21.05 ml/m LA Vol (A4C):   74.7 ml 38.45 ml/m LA Biplane Vol: 59.7 ml 30.73 ml/m  AORTIC VALVE LVOT Vmax:   76.40 cm/s LVOT Vmean:  54.400 cm/s LVOT VTI:    0.128 m  AORTA Ao Root diam: 3.10 cm MITRAL VALVE               TRICUSPID VALVE MV Area (PHT): 3.31 cm    TR Peak grad:   27.2 mmHg MV Decel Time: 229 msec    TR Vmax:        261.00 cm/s MV E velocity: 67.30 cm/s MV A velocity: 57.80 cm/s  SHUNTS MV E/A ratio:  1.16  Systemic VTI:  0.13 m                            Systemic Diam: 1.90 cm Mihai Croitoru MD Electronically signed by Thurmon Fair MD Signature Date/Time: 08/10/2019/3:56:16 PM    Final         Scheduled Meds:  aspirin EC  81 mg Oral Daily   atorvastatin  80 mg Oral q1800   carvedilol  3.125 mg Oral BID WC   clopidogrel  75 mg Oral Daily   enoxaparin (LOVENOX) injection  40 mg Subcutaneous Q24H   LORazepam  1 mg Intravenous Once   losartan  25 mg Oral Daily   Continuous Infusions:   LOS: 3 days    Time spent: 25 minutes. Greater than 50% of this time was spent in direct contact with the patient, coordinating care and discussing relevant ongoing clinical issues.     Chaya Jan, MD [facility] Pager [phone]  If 7PM-7AM, please contact night-coverage [email or web address] Password [facility] 08/12/2019, 11:52 AM

## 2019-08-12 NOTE — Anesthesia Procedure Notes (Signed)
Procedure Name: MAC Date/Time: 08/12/2019 1:40 PM Performed by: Alain Marion, CRNA Pre-anesthesia Checklist: Patient identified, Emergency Drugs available, Suction available, Patient being monitored and Timeout performed Oxygen Delivery Method: Nasal cannula and Non-rebreather mask Placement Confirmation: positive ETCO2

## 2019-08-12 NOTE — Interval H&P Note (Signed)
History and Physical Interval Note:  08/12/2019 1:29 PM  David [name]  has presented today for surgery, with the diagnosis of stroke.  The various methods of treatment have been discussed with the patient and family. After consideration of risks, benefits and other options for treatment, the patient has consented to  Procedure(s): TRANSESOPHAGEAL ECHOCARDIOGRAM (TEE) (N/A) as a surgical intervention.  The patient's history has been reviewed, patient examined, no change in status, stable for surgery.  I have reviewed the patient's chart and labs.  Questions were answered to the patient's satisfaction.     Parke Poisson

## 2019-08-13 ENCOUNTER — Inpatient Hospital Stay (HOSPITAL_COMMUNITY): Payer: Managed Care, Other (non HMO)

## 2019-08-13 ENCOUNTER — Telehealth: Payer: Self-pay | Admitting: *Deleted

## 2019-08-13 ENCOUNTER — Encounter: Payer: Self-pay | Admitting: *Deleted

## 2019-08-13 ENCOUNTER — Other Ambulatory Visit: Payer: Self-pay | Admitting: *Deleted

## 2019-08-13 DIAGNOSIS — I639 Cerebral infarction, unspecified: Secondary | ICD-10-CM

## 2019-08-13 DIAGNOSIS — I4891 Unspecified atrial fibrillation: Secondary | ICD-10-CM

## 2019-08-13 DIAGNOSIS — I5022 Chronic systolic (congestive) heart failure: Secondary | ICD-10-CM

## 2019-08-13 MED ORDER — IVABRADINE HCL 5 MG PO TABS
5.0000 mg | ORAL_TABLET | Freq: Two times a day (BID) | ORAL | Status: AC
  Administered 2019-08-14: 5 mg via ORAL
  Filled 2019-08-13: qty 1

## 2019-08-13 MED ORDER — LORAZEPAM 2 MG/ML IJ SOLN: 1.0000 mg | Freq: Once | INTRAMUSCULAR | Status: AC

## 2019-08-13 MED ORDER — GADOBUTROL 1 MMOL/ML IV SOLN
10.0000 mL | Freq: Once | INTRAVENOUS | Status: AC | PRN
  Administered 2019-08-13: 10 mL via INTRAVENOUS

## 2019-08-13 NOTE — Telephone Encounter (Signed)
Patient enrolled for [employer] to ship a 30 day cardiac event monitor to his home.  Instructions will be included in the monitor kit. 

## 2019-08-13 NOTE — Progress Notes (Addendum)
Progress Note  Patient Name: David [name] Date of Encounter: 08/13/2019  Primary Cardiologist: Olga Millers, MD   Subjective   He is feeling slightly better today.   Inpatient Medications    Scheduled Meds: . apixaban  5 mg Oral BID  . atorvastatin  80 mg Oral q1800  . carvedilol  3.125 mg Oral BID WC  . LORazepam  1 mg Intravenous Once  . losartan  25 mg Oral Daily   Continuous Infusions:  PRN Meds: acetaminophen **OR** acetaminophen (TYLENOL) oral liquid 160 mg/5 mL **OR** acetaminophen, senna-docusate   Vital Signs    Vitals:   08/12/19 1549 08/12/19 1924 08/12/19 2308 08/13/19 0405  BP: 115/74 107/67 117/74 114/72  Pulse: 68 61 74 70  Resp: 18 18 18 18   Temp: 98.7 F (37.1 C) 98.6 F (37 C) 98.4 F (36.9 C) 98.1 F (36.7 C)  TempSrc: Oral Oral Oral Oral  SpO2: 98% 99% 97% 98%  Weight:      Height:        Intake/Output Summary (Last 24 hours) at 08/13/2019 0842 Last data filed at 08/12/2019 1415 Gross per 24 hour  Intake 200 ml  Output --  Net 200 ml   Last 3 Weights 08/12/2019 05/13/2018 01/02/2018  Weight (lbs) 165 lb 165 lb 170 lb  Weight (kg) 74.844 kg 74.844 kg 77.111 kg      Telemetry    NSR with frequent PVCs, couplets and 1 run of nonsustained ventricular tachycardia lasting 15 beats - Personally Reviewed  ECG    NSR without significant ST-T wave changes - Personally Reviewed  Physical Exam   GEN: No acute distress.   Neck: No JVD Cardiac: RRR, no murmurs, rubs, or gallops.  Respiratory: Clear to auscultation bilaterally. GI: Soft, nontender, non-distended  MS: No edema; No deformity. Neuro:  Nonfocal  Psych: Normal affect   Labs    High Sensitivity Troponin:   Recent Labs  Lab 08/11/19 1226  TROPONINIHS 7     Chemistry Recent Labs  Lab 08/09/19 1737 08/09/19 1737 08/09/19 1738 08/11/19 0243 08/12/19 0435  NA 139   < > 141 138 140  K 3.6   < > 3.5 3.6 4.0  CL 104   < > 103 105 105  CO2 26  --   --  25 26  GLUCOSE  122*   < > 118* 107* 101*  BUN 10   < > 11 9 14   CREATININE 1.19   < > 1.10 1.17 1.20  CALCIUM 9.0  --   --  8.9 9.1  PROT 7.3  --   --   --   --   ALBUMIN 4.0  --   --   --   --   AST 23  --   --   --   --   ALT 13  --   --   --   --   ALKPHOS 84  --   --   --   --   BILITOT 0.9  --   --   --   --   GFRNONAA >60  --   --  >60 >60  GFRAA >60  --   --  >60 >60  ANIONGAP 9  --   --  8 9   < > = values in this interval not displayed.    Hematology Recent Labs  Lab 08/09/19 1737 08/09/19 1737 08/09/19 1738 08/11/19 0243 08/12/19 0435  WBC 7.1  --   --  5.6 6.9  RBC 4.54  --   --  4.89 4.98  HGB 14.6   < > 14.6 15.3 15.8  HCT 42.7   < > 43.0 45.5 46.6  MCV 94.1  --   --  93.0 93.6  MCH 32.2  --   --  31.3 31.7  MCHC 34.2  --   --  33.6 33.9  RDW 12.2  --   --  12.0 12.0  PLT 213  --   --  210 227   < > = values in this interval not displayed.   BNPNo results for input(s): BNP, PROBNP in the last 168 hours.   DDimer No results for input(s): DDIMER in the last 168 hours.   Radiology    ECHO TEE 08/12/2019  1. Left ventricular ejection fraction, by estimation, is 20 to 25%. The  left ventricle has severely decreased function. The left ventricle  demonstrates global hypokinesis. The left ventricular internal cavity size  was moderately dilated.  2. Right ventricular systolic function is normal. The right ventricular  size is normal.  3. Left atrial size was mildly dilated. No left atrial/left atrial  appendage thrombus was detected. The LAA emptying velocity was 105 cm/s.  4. The mitral valve is grossly normal. Mild mitral valve regurgitation.  5. The aortic valve is tricuspid. Aortic valve regurgitation is trivial.  6. There is borderline dilatation of the ascending aorta measuring 38 mm.  There is mild (Grade II) atheroma plaque involving the transverse and  descending aorta.  7. Evidence of atrial level shunting detected by color flow Doppler.  Agitated saline  contrast bubble study was positive with shunting observed  within 3-6 cardiac cycles suggestive of interatrial shunt. There is a tiny  patent foramen ovale.   TTE 08/10/2019 demonstrates global hypokinesis. The left ventricular internal cavity size  was moderately dilated. Left  ventricular diastolic parameters are consistent with Grade II diastolic  dysfunction (pseudonormalization). Elevated left atrial pressure.  2. Right ventricular systolic function is normal. The right ventricular  size is normal. There is mildly elevated pulmonary artery systolic  pressure. The estimated right ventricular systolic pressure is 30.2 mmHg.  3. Left atrial size was mildly dilated.  4. The mitral valve is normal in structure and function. Moderate mitral  valve regurgitation.  5. The aortic valve is normal in structure and function. Aortic valve  regurgitation is not visualized.     Patient Profile     54 y.o. male with PMH of tobacco abuse and probable untreated HTN presented with R facial droop and slurred speech. CT showed acute/subacute infarction in the mid L frontal and left frontal operculum. Also noted to have a 7x2.3 cm soft tissue mass along the lateral aspect of L temporal bone involving L external ear concerning for malignancy. CTA of head and neck showed high grade stenosis of prox L MCA, 71mm aneurysm arising from cavernous L ICA. MRI showed L MCA infarction consistent with embolic stroke. Echo showed EF 20-25% with global hypokinesis and grade 2 DD.   Assessment & Plan    1. Cardiomyopathy  - single episode of chest pain a week ago lasted 15 min, Hs troponin negative  - EF 20-25% on echocardiogram.   - plan ischemic eval once recovers from CVA, given his thin body size, he would be a good candidate for coronary CT. Once CHF med fully titrated, will to repeat echo in 3 months, he will require a Lifevest at discharge Dennis Bast has been  called) as he has runs of nsVT  - he is  undergoing cardiac MRI for evaluation of CMP today  - on low dose coreg and losartan, hesitant to increase given permissive hypertension. Will need followup in 2-3 weeks with Dr. Stanford Breed or his APP. Plan to switch losartan to entresto as outpatient.   - nsVT - 15 beats - no room to increase carvedilol, Life Vest at discharge, possible ICD in 3 months  2. L MCA CVA  - TEE showed PFO, no thrombus, EP consulted for loop recorder and felt the patient is not a candidate given low EF and possibly needing ICD in the future if EF does not improve, possibly discharge on event monitor.  3. Moderate MR: noted on echo, followup as outpatient, mild MR on TEE  4. HTN: suspect uncontrolled given no prior diagnosis. CHF regimen  5. Tobacco abuse: cessation advised  6. 3mm aneurysm in the cavernous L ICA: noted on CT  7. L cranial mass: 7 x 2.3 cm: further evaluation by primary care  For questions or updates, please contact [place] Please consult [email or web address] for contact info under     Signed, Ena Dawley, MD  08/13/2019, 8:42 AM

## 2019-08-13 NOTE — Progress Notes (Signed)
PROGRESS NOTE    David [name]  ZOX:[id number] DOB: 1966-04-15 DOA: 08/09/2019 PCP: Patient, No Pcp Per     Brief Narrative:  54 year old man admitted from home on 3/5 with complaints of a speech abnormality and a facial droop.  His past medical history significant for tobacco abuse.  He has significant expressive aphasia.  He was admitted as a stroke outside of window for TPA and admission was requested for further evaluation and management.   Assessment & Plan:   Active Problems:   Acute ischemic stroke (HCC)   Tobacco abuse   Chronic systolic CHF (congestive heart failure) (HCC)   Mass of left temporal lobe   Acute ischemic CVA -CT head showed an acute/early subacute ischemic infarction within the mid left frontal lobe and left frontal operculum without evidence of hemorrhagic conversion. -MRI showed acute branch vessel infarctions in the left MCA territory.  Other punctate foci of acute infarction are visible. -CT Neck with patent carotid arteries bilaterally. -2D ECHO: EF 20-25% with global hypokinesis. Cardiology has been consulted. Plans are for a TEE. -Plan is for dual antiplatelet therapy for 3 weeks and then aspirin alone. -PT/OT/ST recommendations: no follow up recommended. -LDL is 97, goal less than 70, started on Lipitor 40 mg. -Will need event monitor on DC.  Tobacco abuse -Counseled on cessation.  Chronic Systolic CHF -2D ECHO: EF 20-25% with global hypokinesis.  -Started on carvedilol and losartan. -Plans for ischemic work up as an OP once he has recovered from his acute CVA. (?coronary CT). -Cards is recommending inpatient cardiac MRI today and lifevest on DC. -Appreciate cardiology input and recommendations.  Left Temporal Mass -Noted on CT scan. -Per patient and fiancee he suffered trauma to his ear years ago and recently had reconstructive surgery.   DVT prophylaxis: Lovenox Code Status: Full code Family Communication: Fianc at bedside updated on  plan of care and all questions answered on 3/8.  Attempted to contact sister 3/9 to number on chart, unsuccessful, no option to leave voicemail. Disposition Plan: Home pending completed work up for stroke and new diagnosed systolic heart failure. Anticipate 24 hours. Lifevest to be arranged.  Consultants:   Neurology  Cardiology  Procedures:   None  Antimicrobials:  Anti-infectives (From admission, onward)   None       Subjective: Lying in bed, no CP/SOB overnight.   Objective: Vitals:   08/12/19 2308 08/13/19 0405 08/13/19 0846 08/13/19 1220  BP: 117/74 114/72 117/71 139/89  Pulse: 74 70 72 84  Resp: 18 18 16 20   Temp: 98.4 F (36.9 C) 98.1 F (36.7 C) 98.8 F (37.1 C) 98.7 F (37.1 C)  TempSrc: Oral Oral    SpO2: 97% 98% 100% 100%  Weight:      Height:       No intake or output data in the 24 hours ending 08/13/19 1432 Filed Weights   08/12/19 1301  Weight: 74.8 kg    Examination:  General exam: Alert, awake, oriented x 3, some expressive aphasia noted Respiratory system: Clear to auscultation. Respiratory effort normal. Cardiovascular system:RRR. No murmurs, rubs, gallops. Gastrointestinal system: Abdomen is nondistended, soft and nontender. No organomegaly or masses felt. Normal bowel sounds heard. Central nervous system: Alert and oriented. No focal neurological deficits. Extremities: No C/C/E, +pedal pulses Skin: No rashes, lesions or ulcers Psychiatry: Judgement and insight appear normal. Mood & affect appropriate.     Data Reviewed: I have personally reviewed following labs and imaging studies  CBC: Recent Labs  Lab  08/09/19 1737 08/09/19 1738 08/11/19 0243 08/12/19 0435  WBC 7.1  --  5.6 6.9  NEUTROABS 4.3  --   --   --   HGB 14.6 14.6 15.3 15.8  HCT 42.7 43.0 45.5 46.6  MCV 94.1  --  93.0 93.6  PLT 213  --  210 182   Basic Metabolic Panel: Recent Labs  Lab 08/09/19 1737 08/09/19 1738 08/11/19 0243 08/12/19 0435  NA 139 141 138  140  K 3.6 3.5 3.6 4.0  CL 104 103 105 105  CO2 26  --  25 26  GLUCOSE 122* 118* 107* 101*  BUN 10 11 9 14   CREATININE 1.19 1.10 1.17 1.20  CALCIUM 9.0  --  8.9 9.1   GFR: Estimated Creatinine Clearance: 73.6 mL/min (by C-G formula based on SCr of 1.2 mg/dL). Liver Function Tests: Recent Labs  Lab 08/09/19 1737  AST 23  ALT 13  ALKPHOS 84  BILITOT 0.9  PROT 7.3  ALBUMIN 4.0   No results for input(s): LIPASE, AMYLASE in the last 168 hours. No results for input(s): AMMONIA in the last 168 hours. Coagulation Profile: Recent Labs  Lab 08/09/19 1812  INR 1.1   Cardiac Enzymes: No results for input(s): CKTOTAL, CKMB, CKMBINDEX, TROPONINI in the last 168 hours. BNP (last 3 results) No results for input(s): PROBNP in the last 8760 hours. HbA1C: No results for input(s): HGBA1C in the last 72 hours. CBG: Recent Labs  Lab 08/09/19 1732  GLUCAP 130*   Lipid Profile: No results for input(s): CHOL, HDL, LDLCALC, TRIG, CHOLHDL, LDLDIRECT in the last 72 hours. Thyroid Function Tests: No results for input(s): TSH, T4TOTAL, FREET4, T3FREE, THYROIDAB in the last 72 hours. Anemia Panel: No results for input(s): VITAMINB12, FOLATE, FERRITIN, TIBC, IRON, RETICCTPCT in the last 72 hours. Urine analysis:    Component Value Date/Time   COLORURINE STRAW (A) 08/09/2019 2117   APPEARANCEUR CLEAR 08/09/2019 2117   LABSPEC 1.034 (H) 08/09/2019 2117   PHURINE 6.0 08/09/2019 2117   GLUCOSEU NEGATIVE 08/09/2019 2117   HGBUR NEGATIVE 08/09/2019 2117   BILIRUBINUR NEGATIVE 08/09/2019 2117   KETONESUR NEGATIVE 08/09/2019 2117   PROTEINUR NEGATIVE 08/09/2019 2117   NITRITE NEGATIVE 08/09/2019 2117   LEUKOCYTESUR TRACE (A) 08/09/2019 2117   Sepsis Labs: @LABRCNTIP (procalcitonin:4,lacticidven:4)  ) Recent Results (from the past 240 hour(s))  SARS CORONAVIRUS 2 (TAT 6-24 HRS) Nasopharyngeal     Status: None   Collection Time: 08/09/19  9:09 PM   Specimen: Nasopharyngeal  Result Value  Ref Range Status   SARS Coronavirus 2 NEGATIVE NEGATIVE Final    Comment: (NOTE) SARS-CoV-2 target nucleic acids are NOT DETECTED. The SARS-CoV-2 RNA is generally detectable in upper and lower respiratory specimens during the acute phase of infection. Negative results do not preclude SARS-CoV-2 infection, do not rule out co-infections with other pathogens, and should not be used as the sole basis for treatment or other patient management decisions. Negative results must be combined with clinical observations, patient history, and epidemiological information. The expected result is Negative. Fact Sheet for Patients: SugarRoll.be Fact Sheet for Healthcare Providers: https://www.woods-mathews.com/ This test is not yet approved or cleared by the [place] FDA and  has been authorized for detection and/or diagnosis of SARS-CoV-2 by FDA under an Emergency Use Authorization (EUA). This EUA will remain  in effect (meaning this test can be used) for the duration of the COVID-19 declaration under Section 56 4(b)(1) of the Act, 21 U.S.C. section 360bbb-3(b)(1), unless the authorization is terminated or  revoked sooner. Performed at [facility], [address] [place], [place] [address]   Culture, blood (Routine X 2) w Reflex to ID Panel     Status: None (Preliminary result)   Collection Time: 08/10/19  9:55 AM   Specimen: BLOOD LEFT ARM  Result Value Ref Range Status   Specimen Description BLOOD LEFT ARM  Final   Special Requests   Final    BOTTLES DRAWN AEROBIC AND ANAEROBIC Blood Culture adequate volume   Culture   Final    NO GROWTH 3 DAYS Performed at [facility], [address], [place], [place] [address]    Report Status PENDING  Incomplete  Culture, blood (Routine X 2) w Reflex to ID Panel     Status: None (Preliminary result)   Collection Time: 08/10/19 10:03 AM   Specimen: BLOOD RIGHT ARM  Result Value Ref Range Status     Specimen Description BLOOD RIGHT ARM  Final   Special Requests   Final    BOTTLES DRAWN AEROBIC AND ANAEROBIC Blood Culture adequate volume   Culture   Final    NO GROWTH 3 DAYS Performed at [facility], [address], [place], [place] [address]    Report Status PENDING  Incomplete         Radiology Studies: ECHO TEE  Result Date: 08/12/2019    TRANSESOPHOGEAL ECHO REPORT   Patient Name:   David [name] [facility] Date of Exam: 08/12/2019 Medical Rec #:  [id number]       Height:       71.0 in Accession #:    [id number]      Weight:       165.0 lb Date of Birth:  1965-07-28       BSA:          1.943 m Patient Age:    53 years        BP:           122/80 mmHg Patient Gender: M               HR:           94 bpm. Exam Location:  Inpatient Procedure: Transesophageal Echo, Limited Color Doppler and Cardiac Doppler Indications:     Stroke 434.91 / I63.9  History:         Patient has prior history of Echocardiogram examinations, most                  recent 08/10/2019. Stroke; Risk Factors:Current Smoker.  Sonographer:     Leta Jungling RDCS Referring Phys:  [id number] BERGE Diagnosing Phys: Weston Brass MD PROCEDURE: After discussion of the risks and benefits of a TEE, an informed consent was obtained from the patient. TEE procedure time was 18 minutes. The transesophogeal probe was passed without difficulty through the esophogus of the patient. Imaged were obtained with the patient in a left lateral decubitus position. Local oropharyngeal anesthetic was provided with Cetacaine. Sedation performed by different physician. The patient was monitored while under deep sedation. Image quality was good. The patient's vital signs; including heart rate, blood pressure, and oxygen saturation; remained stable throughout the procedure. The patient developed no complications during the procedure. IMPRESSIONS  1. Left ventricular ejection fraction, by estimation, is 20 to 25%. The left ventricle has  severely decreased function. The left ventricle demonstrates global hypokinesis. The left ventricular internal cavity size was moderately dilated.  2. Right ventricular systolic function is  normal. The right ventricular size is normal.  3. Left atrial size was mildly dilated. No left atrial/left atrial appendage thrombus was detected. The LAA emptying velocity was 105 cm/s.  4. The mitral valve is grossly normal. Mild mitral valve regurgitation.  5. The aortic valve is tricuspid. Aortic valve regurgitation is trivial.  6. There is borderline dilatation of the ascending aorta measuring 38 mm. There is mild (Grade II) atheroma plaque involving the transverse and descending aorta.  7. Evidence of atrial level shunting detected by color flow Doppler. Agitated saline contrast bubble study was positive with shunting observed within 3-6 cardiac cycles suggestive of interatrial shunt. There is a tiny patent foramen ovale. FINDINGS  Left Ventricle: Left ventricular ejection fraction, by estimation, is 20 to 25%. The left ventricle has severely decreased function. The left ventricle demonstrates global hypokinesis. The left ventricular internal cavity size was moderately dilated. There is no left ventricular hypertrophy. Right Ventricle: The right ventricular size is normal. No increase in right ventricular wall thickness. Right ventricular systolic function is normal. Left Atrium: Left atrial size was mildly dilated. No left atrial/left atrial appendage thrombus was detected. The LAA emptying velocity was 105 cm/s. Right Atrium: Right atrial size was normal in size. Pericardium: Trivial pericardial effusion is present. Mitral Valve: The mitral valve is grossly normal. Mild mitral valve regurgitation. Tricuspid Valve: The tricuspid valve is normal in structure. Tricuspid valve regurgitation is trivial. Aortic Valve: The aortic valve is tricuspid. Aortic valve regurgitation is trivial. Pulmonic Valve: The pulmonic valve was  normal in structure. Pulmonic valve regurgitation is not visualized. Aorta: The aortic root is normal in size and structure. There is borderline dilatation of the ascending aorta measuring 38 mm. There is mild (Grade II) atheroma plaque involving the transverse and descending aorta. IAS/Shunts: There is redundancy of the interatrial septum. Evidence of atrial level shunting detected by color flow Doppler. Agitated saline contrast was given intravenously to evaluate for intracardiac shunting. Agitated saline contrast bubble study was  positive with shunting observed within 3-6 cardiac cycles suggestive of interatrial shunt. A tiny patent foramen ovale is detected.   AORTA Ao Sinus diam: 3.30 cm Ao Asc diam:   3.80 cm Weston Brass MD Electronically signed by Weston Brass MD Signature Date/Time: 08/12/2019/9:26:01 PM    Final         Scheduled Meds: . apixaban  5 mg Oral BID  . atorvastatin  80 mg Oral q1800  . carvedilol  3.125 mg Oral BID WC  . losartan  25 mg Oral Daily   Continuous Infusions:   LOS: 4 days    Time spent: 25 minutes. Greater than 50% of this time was spent in direct contact with the patient, coordinating care and discussing relevant ongoing clinical issues.     Chaya Jan, MD [facility] Pager [phone]  If 7PM-7AM, please contact night-coverage [email or web address] Password [facility] 08/13/2019, 2:32 PM

## 2019-08-13 NOTE — Progress Notes (Signed)
Information sent to Alvino Chapel with [employer] for [facility]. [phone] TOC following for further d/c needs.

## 2019-08-13 NOTE — Progress Notes (Signed)
Called to pt.'s room. Pt. Upset, wanting both of his sisters to be able to visit. Visitation policy explained to the pt. again (AD already had discussion with pt and his significant other yesterday after his sisters showed up to visit.) Pt. Feels like sisters have been left out of communication. I explained that I spoke with [facility] yesterday and explained what I could to her but she was demanding pt. Call her as she stated pt.'s girlfriend said pt. Couldn't talk. D/w pt.'s sister that as pt. Can make his own decisions I cannot make pt. Call her but I would let him know once he came back from procedure. I did mention this to the pt. Yesterday and instead of him calling her he requested the MD call her. Notified MD who stated also that the pt. Is oriented and that he and his significant other were updated and pt. Should call sister himself to update. Pt. Did not call his sister.   I re-explained all this again with the patient today. Pt. Still frustrated with situation but this writer explained that there can only be 1 point person and that staff will not get in the middle of the situation between his sisters and his girlfriend.

## 2019-08-13 NOTE — Progress Notes (Signed)
  Speech Language Pathology Treatment: Cognitive-Linquistic  Patient Details Name: HANISH LARAIA MRN: [id number] DOB: Jun 22, 1965 Today's Date: 08/13/2019 Time: 3009-2330 SLP Time Calculation (min) (ACUTE ONLY): 18 min  Assessment / Plan / Recommendation Clinical Impression  Pt continues to present with expressive difficulties, able to get basic and mildly complex thoughts across with extra time for word-finding. He primarily expresses himself with key words and short phrases, consistent with more telegraphic speech. Pt produced complete sentence given Min cues from SLP during descriptive verbal tasks. He expressed his frustration with not being able to talk as well as he typically does. He would benefit from OP SLP follow up for aphasia.   HPI HPI: 54 y.o. male with medical history significant for tobacco use who presented to the ED for evaluation of speech abnormality, aphasia,  and right facial droop. CT abnormal hypodensity consistent with acute/early subacute infarction involving the mid left frontal lobe and left frontal operculum.       SLP Plan  Continue with current plan of care       Recommendations                   Follow up Recommendations: Outpatient SLP SLP Visit Diagnosis: Aphasia (R47.01) Plan: Continue with current plan of care       GO                 Mahala Menghini., M.A. CCC-SLP Acute Rehabilitation Services Pager [phone] Office [phone]  08/13/2019, 3:01 PM

## 2019-08-14 ENCOUNTER — Other Ambulatory Visit: Payer: Self-pay | Admitting: Medical

## 2019-08-14 ENCOUNTER — Inpatient Hospital Stay (HOSPITAL_COMMUNITY): Payer: Managed Care, Other (non HMO)

## 2019-08-14 DIAGNOSIS — I429 Cardiomyopathy, unspecified: Secondary | ICD-10-CM

## 2019-08-14 DIAGNOSIS — I639 Cerebral infarction, unspecified: Secondary | ICD-10-CM

## 2019-08-14 DIAGNOSIS — I5021 Acute systolic (congestive) heart failure: Secondary | ICD-10-CM

## 2019-08-14 LAB — COMPREHENSIVE METABOLIC PANEL
ALT: 10 U/L (ref 0–44)
AST: 17 U/L (ref 15–41)
Albumin: 3.4 g/dL — ABNORMAL LOW (ref 3.5–5.0)
Alkaline Phosphatase: 80 U/L (ref 38–126)
Anion gap: 8 (ref 5–15)
BUN: 18 mg/dL (ref 6–20)
CO2: 24 mmol/L (ref 22–32)
Calcium: 8.7 mg/dL — ABNORMAL LOW (ref 8.9–10.3)
Chloride: 103 mmol/L (ref 98–111)
Creatinine, Ser: 1.2 mg/dL (ref 0.61–1.24)
GFR calc Af Amer: >60 mL/min (ref >60)
GFR calc non Af Amer: >60 mL/min (ref >60)
Glucose, Bld: 95 mg/dL (ref 70–99)
Potassium: 4.1 mmol/L (ref 3.5–5.1)
Sodium: 135 mmol/L (ref 135–145)
Total Bilirubin: 0.7 mg/dL (ref 0.3–1.2)
Total Protein: 6.9 g/dL (ref 6.5–8.1)

## 2019-08-14 LAB — CBC WITH DIFFERENTIAL/PLATELET
Abs Immature Granulocytes: 0.01 10*3/uL (ref 0.00–0.07)
Basophils Absolute: 0 10*3/uL (ref 0.0–0.1)
Basophils Relative: 0 %
Eosinophils Absolute: 0.4 10*3/uL (ref 0.0–0.5)
Eosinophils Relative: 6 %
HCT: 45.2 % (ref 39.0–52.0)
Hemoglobin: 15.4 g/dL (ref 13.0–17.0)
Immature Granulocytes: 0 %
Lymphocytes Relative: 38 %
Lymphs Abs: 2.6 10*3/uL (ref 0.7–4.0)
MCH: 31.4 pg (ref 26.0–34.0)
MCHC: 34.1 g/dL (ref 30.0–36.0)
MCV: 92.2 fL (ref 80.0–100.0)
Monocytes Absolute: 0.7 10*3/uL (ref 0.1–1.0)
Monocytes Relative: 11 %
Neutro Abs: 3.1 10*3/uL (ref 1.7–7.7)
Neutrophils Relative %: 45 %
Platelets: 219 10*3/uL (ref 150–400)
RBC: 4.9 MIL/uL (ref 4.22–5.81)
RDW: 11.8 % (ref 11.5–15.5)
WBC: 6.9 10*3/uL (ref 4.0–10.5)
nRBC: 0 % (ref 0.0–0.2)

## 2019-08-14 LAB — MAGNESIUM: Magnesium: 2.1 mg/dL (ref 1.7–2.4)

## 2019-08-14 LAB — PHOSPHORUS: Phosphorus: 3 mg/dL (ref 2.5–4.6)

## 2019-08-14 MED ORDER — MIDAZOLAM HCL 2 MG/2ML IJ SOLN
INTRAMUSCULAR | Status: AC
  Filled 2019-08-14: qty 4

## 2019-08-14 MED ORDER — CARVEDILOL 3.125 MG PO TABS: 3.1250 mg | ORAL_TABLET | Freq: Two times a day (BID) | ORAL | 0 refills | Status: DC

## 2019-08-14 MED ORDER — APIXABAN 5 MG PO TABS: 5.0000 mg | ORAL_TABLET | Freq: Two times a day (BID) | ORAL | 0 refills | Status: DC

## 2019-08-14 MED ORDER — IOHEXOL 350 MG/ML SOLN
80.0000 mL | Freq: Once | INTRAVENOUS | Status: AC | PRN
  Administered 2019-08-14: 80 mL via INTRAVENOUS

## 2019-08-14 MED ORDER — NITROGLYCERIN 0.4 MG SL SUBL
SUBLINGUAL_TABLET | SUBLINGUAL | Status: AC
  Filled 2019-08-14: qty 2

## 2019-08-14 MED ORDER — METOPROLOL TARTRATE 5 MG/5ML IV SOLN
INTRAVENOUS | Status: AC
  Filled 2019-08-14: qty 5

## 2019-08-14 MED ORDER — LOSARTAN POTASSIUM 25 MG PO TABS: 25.0000 mg | ORAL_TABLET | Freq: Every day | ORAL | 0 refills | Status: DC

## 2019-08-14 MED ORDER — ATORVASTATIN CALCIUM 80 MG PO TABS: 80.0000 mg | ORAL_TABLET | Freq: Every day | ORAL | 0 refills | Status: DC

## 2019-08-14 MED FILL — ATORVASTATIN CALCIUM 80 MG: 80 | 30 days supply | Qty: 30 | Fill #0

## 2019-08-14 MED FILL — CARVEDILOL 3.125 MG TABLET: 3.125 | 30 days supply | Qty: 60 | Fill #0

## 2019-08-14 MED FILL — ELIQUIS 5 MG TABLET: 5 | 30 days supply | Qty: 60 | Fill #0

## 2019-08-14 MED FILL — LOSARTAN POTASSIUM 25 MG TA: 25 | 30 days supply | Qty: 30 | Fill #0

## 2019-08-14 NOTE — TOC Transition Note (Signed)
Transition of Care [facility]) - CM/SW Discharge Note   Patient Details  Name: David [name] MRN: [id number] Date of Birth: 04/26/1966  [facility]) CM/SW Contact:  Kermit Balo, RN Phone Number: 08/14/2019, 4:20 PM   Clinical Narrative:    Pt discharging home with self care. No f/u per PT/OT and no DME needs.  Life Vest at the bedside.  TOC pharmacy to deliver discharge meds to the room.  CM provided pt $10 co pay card for Eliquis.  Pt has transportation home.     Final next level of care: Home/Self Care Barriers to Discharge: No Barriers Identified   Patient Goals and CMS Choice        Discharge Placement                       Discharge Plan and Services                                     Social Determinants of Health (SDOH) Interventions     Readmission Risk Interventions No flowsheet data found.

## 2019-08-14 NOTE — Discharge Summary (Signed)
Physician Discharge Summary  David [name] YNW:[id number] DOB: 07/24/1965 DOA: 08/09/2019  PCP: Patient, No Pcp Per  Admit date: 08/09/2019 Discharge date: 08/14/2019  Admitted From: Home Disposition: Home  Recommendations for Outpatient Follow-up:  1. Follow up with PCP in 1-2 weeks 2. Follow up with Cardiology in 1-2 weeks 3. Follow up with Neurology Dr. Pearlean Brownie in 1-2 weeks 4. Follow up with IR Dr. Drusilla Kanner in 1-2 weeks 5. Follow up with ENT Dr. Jearld Fenton for evaluation  6. Have Cardiology arrange Outpatient 30 Day Cardiac Event Monitoring  7. Please obtain BMP/CBC in one week 8. Please follow up on the following pending results:  Home Health: No Equipment/Devices: Lifevest    Discharge Condition: Stable CODE STATUS: FULL CODE Diet recommendation: Heart Healthy Diet   Brief/Interim Summary: The patient 54 year old man admitted from home on 3/5 with complaints of a speech abnormality and a facial droop.  His past medical history significant for tobacco abuse.  He had significant expressive aphasia.  He was admitted as a stroke outside of window for TPA and admission was requested for further evaluation and management.  On further work-up he was found to have significant systolic CHF with global hypokinesis and is started on losartan carvedilol.  He underwent cardiac MRI as well as a coronary CT scan and felt that his cardiomyopathy was secondary to a prethrombotic state and they recommended anticoagulation and outpatient ischemic evaluation once he recovers from his CVA.  He does have LifeVest now and will need to have further discussion as an outpatient with EP cardiology.  He had a 7 mm aneurysm in the cavernous left ICA which was noted on CT scan and he will follow up with Dr.-4 4.  He also had a left cranial mass which was 7 x 2.3 cm and involve the left ear and will be evaluated by ENT Dr. Jearld Fenton in the outpatient setting.  Cardiology and neurology both deemed the patient stable for  discharge after his work-up was complete and he will need to follow-up with them respectively within the next coming weeks.  PT OT recommended no therapies and he will be discharged home at this time.  Discharge Diagnoses:  Active Problems:   Acute ischemic stroke (HCC)   Tobacco abuse   Chronic systolic CHF (congestive heart failure) (HCC)   Mass of left temporal lobe  Acute ischemic CVA -CT head showed an acute/early subacute ischemic infarction within the mid left frontal lobe and left frontal operculum without evidence of hemorrhagic conversion. -MRI showed acute branch vessel infarctions in the left MCA territory.  Other punctate foci of acute infarction are visible. -CT Neck with patent carotid arteries bilaterally. -2D ECHO: EF 20-25% with global hypokinesis. Cardiology has been consulted. Plans are for a TEE which was done and showed tiny PFO (likely incidental) and no thrombus; EP was consulted for loop recorder but felt the patient was not a candidate given his low EF and possibly needing an ICD in the future if his EF does not improve and they recommended a event monitor on discharge which will be arranged by cardiology -Plan was for dual antiplatelet therapy for 3 weeks and then aspirin alone however this was changed to Eliquis given his low EF with a recommendation of repeating TTE in 2 to 3 months and once EF is greater than 30 to 35% having Eliquis switch to aspirin -PT/OT/ST recommendations: no follow up recommended. -LDL is 97, goal less than 70, started on Lipitor 40 mg. -Will need event monitor  on DC and have cardiology arranged -We will need to follow-up with Dr. Pearlean Brownie at the stroke clinic in about 4 weeks  Tobacco abuse -Counseled on cessation.  Chronic Systolic CHF/cardiomyopathy -2D ECHO: EF 20-25% with global hypokinesis.  -Started on carvedilol and losartan cardiology team -Plans for ischemic work up as an OP once he has recovered from his acute CVA. (?coronary  CT). -Cards is recommending inpatient cardiac MRI and lifevest on DC. -Cardiology evaluated with an MRI which showed inferior wall scarring consistent with prior myocardial infarction -Because of this patient underwent a coronary CTA which was completely normal and they felt that his myocardial infarction probably embolic as his MRI did show a heavy/neck apex consistent with a prethrombotic state -Cardiology recommends continuing Eliquis -Cardiology recommends continuing low-dose carvedilol and losartan with hesitation to increase given permissive hypertension and will need follow-up in 2 to 3 weeks with Dr. [name] and switch losartan to [facility] as an outpatient -Because the patient had a nonsustained ventricular tachycardia with 15 beats they have provided the patient a LifeVest on discharge and possible ICD in 3 months -Appreciate cardiology input and recommendations.  Left Temporal Mass -Noted on CT scan. -Per patient and fiancee he suffered trauma to his ear years ago and recently had reconstructive surgery. -Stroke CT Scan showed "7.0 x 2.3 cm cutaneous/subcutaneous soft tissue mass along the lateral aspect of the left temporal bone, involving the left external ear, highly suspicious for malignancy." -Outpatient Referral made to ENT Dr. Jearld Fenton on 08/20/19 at 1:50 pm for further evaluation  Hypertension -Currently started on carvedilol and losartan as above  Aneurysm in the cavernous left ICA -Noted to be 7 mm -Referral made to interventional radiology Dr. Corliss Skains   Hyperlipidemia  -continue with atorvastatin at discharge  Discharge Instructions  Discharge Instructions    Ambulatory referral to Neurology   Complete by: As directed    An appointment is requested in approximately: 4 weeks   Call MD for:  difficulty breathing, headache or visual disturbances   Complete by: As directed    Call MD for:  extreme fatigue   Complete by: As directed    Call MD for:  hives   Complete  by: As directed    Call MD for:  persistant dizziness or light-headedness   Complete by: As directed    Call MD for:  persistant nausea and vomiting   Complete by: As directed    Call MD for:  redness, tenderness, or signs of infection (pain, swelling, redness, odor or green/yellow discharge around incision site)   Complete by: As directed    Call MD for:  severe uncontrolled pain   Complete by: As directed    Call MD for:  temperature >100.4   Complete by: As directed    Diet - low sodium heart healthy   Complete by: As directed    Diet - low sodium heart healthy   Complete by: As directed    Discharge instructions   Complete by: As directed    You were cared for by a hospitalist during your hospital stay. If you have any questions about your discharge medications or the care you received while you were in the hospital after you are discharged, you can call the unit and ask to speak with the hospitalist on call if the hospitalist that took care of you is not available. Once you are discharged, your primary care physician will handle any further medical issues. Please note that NO REFILLS for any discharge  medications will be authorized once you are discharged, as it is imperative that you return to your primary care physician (or establish a relationship with a primary care physician if you do not have one) for your aftercare needs so that they can reassess your need for medications and monitor your lab values.  Follow up with PCP, Cardiology, Neurology, IR, and ENT as an outpatient. Take all medications as prescribed. If symptoms change or worsen please return to the ED for evaluation   Increase activity slowly   Complete by: As directed    Increase activity slowly   Complete by: As directed      Allergies as of 08/14/2019   No Known Allergies     Medication List    STOP taking these medications   ondansetron 4 MG tablet Commonly known as: ZOFRAN   oxyCODONE-acetaminophen 5-325  MG tablet Commonly known as: PERCOCET/ROXICET     TAKE these medications   acetaminophen 325 MG tablet Commonly known as: TYLENOL Take 2 tablets (650 mg total) by mouth every 6 (six) hours as needed for mild pain (or temp > 100).   apixaban 5 MG Tabs tablet Commonly known as: ELIQUIS Take 1 tablet (5 mg total) by mouth 2 (two) times daily.   atorvastatin 80 MG tablet Commonly known as: LIPITOR Take 1 tablet (80 mg total) by mouth daily at 6 PM.   carvedilol 3.125 MG tablet Commonly known as: COREG Take 1 tablet (3.125 mg total) by mouth 2 (two) times daily with a meal.   ibuprofen 600 MG tablet Commonly known as: ADVIL Take 1 tablet (600 mg total) by mouth every 6 (six) hours as needed.   losartan 25 MG tablet Commonly known as: COZAAR Take 1 tablet (25 mg total) by mouth daily. Start taking on: August 15, 2019      Follow-up Information    Marinus Maw, MD Follow up on 11/12/2019.   Specialty: Cardiology Why: at 1030 am to discuss loop vs defibrillator Contact information: [address] [place] [address] [phone]        Graciella Freer, PA-C Follow up on 09/18/2019.   Specialty: Physician Assistant Why: at 1005 to discuss event monitor results Contact information: [address] [place] [address] [phone]        [facility] Follow up on 08/21/2019.   Why: Your appointment is at 7:50 am. Please arrive early and bring: picture ID, insurance card and your home medications.  Contact information: [address], [place], [place] [address]  [phone]       Julieanne Cotton, MD. Schedule an appointment as soon as possible for a visit in 4 week(s).   Specialties: Interventional Radiology, Radiology Contact information: [address] [place] [address] [phone]        Micki Riley, MD. Schedule an appointment as soon as possible for a visit in 4 week(s).   Specialties: Neurology,  Radiology Contact information: [address] [place] [address] [phone]        Suzanna Obey, MD. Go to.   Specialty: Otolaryngology Why: Appointment made for you for Tuesday 08/20/19 at 1:50 pm; Will need to arrive earlier for paperwork Contact information: [address] [place] [address] [phone]          No Known Allergies  Consultations:  Cardiology  Neurology  Procedures/Studies: CT Code Stroke CTA Head W/WO contrast  Addendum Date: 08/09/2019  ADDENDUM REPORT: 08/09/2019 18:31 ADDENDUM: Please note there is also a moderate to severe stenosis within the A1 right anterior cerebral artery (series 8, image 21) (series 9, image 17). These results were called by telephone at the time of interpretation on 08/09/2019 at 6:31 pm to provider Dr. Laurence Slate, who verbally acknowledged these results. Electronically Signed   By: Jackey Loge DO   On: 08/09/2019 18:31   Result Date: 08/09/2019 CLINICAL DATA:  Stroke code. EXAM: CT ANGIOGRAPHY HEAD TECHNIQUE: Multidetector CT imaging of the head was performed using the standard protocol during bolus administration of intravenous contrast. Multiplanar CT image reconstructions and MIPs were obtained to evaluate the vascular anatomy. CONTRAST:  80mL OMNIPAQUE IOHEXOL 350 MG/ML SOLN COMPARISON:  Noncontrast head CT performed earlier the same day. FINDINGS: Please refer to full dictation of the CTA head/neck and CT perfusion under the CT angiogram neck examination. IMPRESSION: Please refer to full dictation of the CTA head/neck and CT perfusion under the CT angiogram neck examination. Electronically Signed: By: Jackey Loge DO On: 08/09/2019 18:18   CT Code Stroke CTA Neck W/WO contrast  Addendum Date: 08/09/2019   ADDENDUM REPORT: 08/09/2019 22:14 ADDENDUM: Please note there is also a moderate to severe stenosis within the A1 right anterior cerebral artery (series 8, image 21) (series 9, image 17). These results  were called by telephone at the time of interpretation on 08/09/2019 at 6:31 pm to provider Dr. Laurence Slate, who verbally acknowledged these results. Electronically Signed   By: Jackey Loge DO   On: 08/09/2019 22:14   Result Date: 08/09/2019 CLINICAL DATA:  Stroke code. Focal neuro deficit, greater than 6 hours, stroke suspected. EXAM: CT ANGIOGRAPHY HEAD AND NECK CT PERFUSION BRAIN TECHNIQUE: Multidetector CT imaging of the head and neck was performed using the standard protocol during bolus administration of intravenous contrast. Multiplanar CT image reconstructions and MIPs were obtained to evaluate the vascular anatomy. Carotid stenosis measurements (when applicable) are obtained utilizing NASCET criteria, using the distal internal carotid diameter as the denominator. Multiphase CT imaging of the brain was performed following IV bolus contrast injection. Subsequent parametric perfusion maps were calculated using RAPID software. CONTRAST:  Administered contrast not known at this time COMPARISON:  Noncontrast head CT performed earlier the same day. FINDINGS: CTA NECK FINDINGS Aortic arch: Standard aortic branching. No significant innominate or proximal subclavian artery stenosis Right carotid system: CCA and ICA patent within the neck without stenosis. Mild mixed plaque within the carotid bulb. Left carotid system: CCA and ICA patent within the neck without stenosis. Vertebral arteries: Codominant. Patent within the neck without stenosis Skeleton: No acute bony abnormality. Cervical spondylosis with multilevel posterior disc osteophytes, uncovertebral and facet hypertrophy. Other neck: No neck mass or cervical lymphadenopathy. Upper chest: No consolidation within the imaged lung apices. Bullous emphysema. Review of the MIP images confirms the above findings CTA HEAD FINDINGS Anterior circulation: The intracranial internal carotid arteries are patent without high-grade stenosis. 7 mm annular is arising from the cavernous  left ICA (series 5, image 124). The M1 right middle cerebral artery is patent without significant stenosis. No right M2 proximal branch occlusion or high-grade proximal stenosis is identified. The M1 left middle cerebral artery is patent without significant stenosis. No definite M2 proximal branch occlusion is identified. There is a high-grade focal stenosis within a proximal inferior division left M2 branch (series 10, image 12). There is also a high-grade stenosis within a mid to distal superior division M2 branch (series 10, image 12). The anterior  cerebral arteries are patent without high-grade proximal stenosis. Posterior circulation: The intracranial right vertebral artery is dominant and patent without significant stenosis. The intracranial left vertebral artery is developmentally diminutive and markedly irregular, but patent. The basilar artery is patent without significant stenosis. The bilateral posterior cerebral arteries are patent without significant proximal stenosis. Posterior communicating arteries are poorly delineated and may be hypoplastic or absent bilaterally. Venous sinuses: Within limitations of contrast timing, no convincing thrombus. Anatomic variants: As described Review of the MIP images confirms the above findings CT Brain Perfusion Findings: ASPECTS: 9 CBF (<30%) Volume: 80mL Perfusion (Tmax>6.0s) volume: 68mL Mismatch Volume: 66mL IMPRESSION: CTA neck: 1. The bilateral common and internal carotid arteries are patent within the neck without significant stenosis. 2. The vertebral arteries are patent within the neck without stenosis. CTA head: 1. No intracranial large vessel occlusion. No definite proximal M2 left MCA branch occlusion is identified. 2. There is a high-grade stenosis within an inferior division proximal left MCA M2 branch. Additional high-grade stenosis within the mid to distal superior division left MCA M2 branch. 3. 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 CT  perfusion head: The perfusion software identifies no core infarct. The perfusion software identifies no critically hypoperfused parenchyma utilizing a Tmax>6 seconds threshold. However, there were clear changes of ischemic infarction within the mid left frontal lobe and left frontal operculum on noncontrast head CT performed immediately prior. Of note, there is a 9 mL region of hypoperfused parenchyma in this region when utilizing a Tmax > 4 seconds threshold. Electronically Signed: By: Kellie Simmering DO On: 08/09/2019 18:12   MR BRAIN WO CONTRAST  Result Date: 08/10/2019 CLINICAL DATA:  Right facial droop.  Follow-up acute stroke. EXAM: MRI HEAD WITHOUT CONTRAST TECHNIQUE: Multiplanar, multiecho pulse sequences of the brain and surrounding structures were obtained without intravenous contrast. COMPARISON:  CT studies 08/09/2019 FINDINGS: Brain: No abnormality seen affecting the brainstem, cerebellum or right cerebral hemisphere. In the left cerebral hemisphere, there is a confluent acute infarction measuring 3 x 3 x 1.5 cm within the left frontal operculum. Few other punctate foci of acute infarction posterior to that in the left posterior frontal and frontoparietal junction regions. No evidence of mass effect or hemorrhage. Findings are consistent with left MCA territory embolic infarctions. Vascular: Major vessels at the base of the brain show flow. Skull and upper cervical spine: Negative Sinuses/Orbits: Inflammatory rhinosinusitis, most pronounced affecting the left maxillary sinus. Other: None IMPRESSION: Acute branch vessel infarctions in the left MCA territory. Largest infarction is a 3 x 3 x 1.5 cm left frontal operculum cortical and subcortical infarction. Few other punctate foci of acute infarction posterior to that at the left posterior frontal vertex and frontoparietal vertex. No mass effect. No blood products. Remainder of the brain is normal. Electronically Signed   By: Nelson Chimes M.D.   On:  08/10/2019 02:55   CT CORONARY MORPH W/CTA COR W/SCORE W/CA W/CM &/OR WO/CM  Addendum Date: 08/14/2019   ADDENDUM REPORT: 08/14/2019 13:07 CLINICAL DATA:  54 year old male with new cardiomyopathy and a stroke. EXAM: Cardiac/Coronary  CTA TECHNIQUE: The patient was scanned on a [employer]. FINDINGS: A 100 kV prospective scan was triggered in the descending thoracic aorta at 111 HU's. Axial non-contrast 3 mm slices were carried out through the heart. The data set was analyzed on a dedicated work station and scored using the [place]. Gantry rotation speed was 250 msecs and collimation was .6 mm. 10 mg of PO Ivabradine  and 0.8 mg of sl NTG was given. The 3D data set was reconstructed in 5% intervals of the 67-82 % of the R-R cycle. Diastolic phases were analyzed on a dedicated work station using MPR, MIP and VRT modes. The patient received 80 cc of contrast. Aorta:  Normal size.  No calcifications.  No dissection. Aortic Valve:  Trileaflet.  No calcifications. Coronary Arteries:  Normal coronary origin.  Right dominance. RCA is a large dominant artery that gives rise to PDA and PLA. There is no plaque. Left main is a large artery that gives rise to LAD and LCX arteries. Left main has no plaque. LAD is a large vessel that has no plaque. LCX is a non-dominant artery that gives rise to two OM branches. There is no plaque. Other findings: Normal pulmonary vein drainage into the left atrium. Normal left atrial appendage without a thrombus. Normal size of the pulmonary artery. IMPRESSION: 1. Coronary calcium score of 0. This was 0 percentile for age and sex matched control. 2. Normal coronary origin with right dominance. 3. CAD-RADS 0. No evidence of CAD (0%). Consider non-atherosclerotic causes of chest pain. Electronically Signed   By: Tobias Alexander   On: 08/14/2019 13:07   Result Date: 08/14/2019 EXAM: OVER-READ INTERPRETATION  CT CHEST The following report is an over-read performed by  radiologist Dr. Charlett Nose of [facility] on 08/14/2019. This over-read does not include interpretation of cardiac or coronary anatomy or pathology. The coronary CTA interpretation by the cardiologist is attached. COMPARISON:  None. FINDINGS: Vascular: Heart is normal size.  Visualized aorta normal caliber Mediastinum/Nodes: No adenopathy in the lower mediastinum or hila. Lungs/Pleura: Linear scarring in the lung bases. No confluent opacities or effusions. Upper Abdomen: Imaging into the upper abdomen shows no acute findings. Musculoskeletal: Chest wall soft tissues are unremarkable. Old posterior right rib fracture. No acute bony abnormality. IMPRESSION: No acute or significant extracardiac abnormality. Electronically Signed: By: Charlett Nose M.D. On: 08/14/2019 09:41   CT Code Stroke Cerebral Perfusion with contrast  Result Date: 08/09/2019 CLINICAL DATA:  Stroke code. EXAM: CT PERFUSION BRAIN TECHNIQUE: Multiphase CT imaging of the brain was performed following IV bolus contrast injection. Subsequent parametric perfusion maps were calculated using RAPID software. CONTRAST:  31mL OMNIPAQUE IOHEXOL 350 MG/ML SOLN COMPARISON:  Noncontrast head CT performed earlier the same day. FINDINGS: Please refer to complete dictation for the CTA head/neck and CT perfusion under the CTA neck dictation. IMPRESSION: Please refer to complete dictation for the CTA head/neck and CT perfusion under the CTA neck dictation. Electronically Signed   By: Jackey Loge DO   On: 08/09/2019 18:19   MR CARDIAC MORPHOLOGY W WO CONTRAST  Result Date: 08/13/2019 CLINICAL DATA:  54 year old male with newly diagnosed cardiomyopathy of unknown etiology, acute combined systolic and diastolic CHF, nsVT and stroke. EXAM: CARDIAC MRI TECHNIQUE: The patient was scanned on a 1.5 Tesla GE magnet. A dedicated cardiac coil was used. Functional imaging was done using [employer] sequences. 2,3, and 4 chamber views were done to assess for RWMA's.  Modified Simpson's rule using a short axis stack was used to calculate an ejection fraction on a dedicated work [employer]. The patient received 11 cc of Gadavist. After 10 minutes inversion recovery sequences were used to assess for infiltration and scar tissue. CONTRAST:  11 cc  of Gadavist FINDINGS: 1. Severely dilated left ventricle with mild concentric hypertrophy and severely decreased systolic function (LVEF = 20%). There is severe diffuse hypokinesis with  akinesis in the basal, mid and apical inferior wall. There is transmural late gadolinium enhancement in the basal, mid and apical inferior walls. LVEDD: 69 mm LVESD: 64 mm LVEDV: 260 ml LVESV: 208 ml SV: 53 ml CO: 3.7 L/min Myocardial mass: 230 g 2. Normal right ventricular size, thickness and mildly decreased systolic function (LVEF = 39%). There are no regional wall motion abnormalities. 3. Normal left and right atrial size. 4. Normal size of the aortic root, ascending aorta and pulmonary artery. 5.  Mild mitral and tricuspid regurgitation. 6. Normal pericardium.  No pericardial effusion. IMPRESSION: 1. Severely dilated left ventricle with mild concentric hypertrophy and severely decreased systolic function (LVEF = 20%). There is severe diffuse hypokinesis with akinesis in the basal, mid and apical inferior wall. There is transmural late gadolinium enhancement in the basal, mid and apical inferior walls. There is a heavy smoke/sludge in the left ventricular apex consistent with pre-thrombotic state. Systemic anticoagulation is recommended. 2. Normal right ventricular size, thickness and mildly decreased systolic function (LVEF = 39%). There are no regional wall motion abnormalities. 3. Normal left and right atrial size. 4. Normal size of the aortic root, ascending aorta and pulmonary artery. 5. Mild mitral and tricuspid regurgitation. 6. Normal pericardium.  No pericardial effusion. These findings are suspicious for an ischemic  cardiomyopathy, an ischemic workup is recommended. Electronically Signed   By: Tobias Alexander   On: 08/13/2019 20:59   ECHOCARDIOGRAM COMPLETE  Result Date: 08/10/2019    ECHOCARDIOGRAM REPORT   Patient Name:   THARON BOMAR [facility] Date of Exam: 08/10/2019 Medical Rec #:  [id number]       Height:       71.0 in Accession #:    [id number]      Weight:       165.0 lb Date of Birth:  Jul 15, 1965       BSA:          1.943 m Patient Age:    53 years        BP:           146/85 mmHg Patient Gender: M               HR:           61 bpm. Exam Location:  Inpatient Procedure: 2D Echo Indications:    Stroke 434.91 / I163.9  History:        Patient has no prior history of Echocardiogram examinations.                 Risk Factors:Tobacco abuse.  Sonographer:    Leeroy Bock Turrentine Referring Phys: [id number] VISHAL R PATEL IMPRESSIONS  1. Left ventricular ejection fraction, by estimation, is 20 to 25%. The left ventricle has severely decreased function. The left ventricle demonstrates global hypokinesis. The left ventricular internal cavity size was moderately dilated. Left ventricular diastolic parameters are consistent with Grade II diastolic dysfunction (pseudonormalization). Elevated left atrial pressure.  2. Right ventricular systolic function is normal. The right ventricular size is normal. There is mildly elevated pulmonary artery systolic pressure. The estimated right ventricular systolic pressure is 30.2 mmHg.  3. Left atrial size was mildly dilated.  4. The mitral valve is normal in structure and function. Moderate mitral valve regurgitation.  5. The aortic valve is normal in structure and function. Aortic valve regurgitation is not visualized. Comparison(s): No prior Echocardiogram. Conclusion(s)/Recommendation(s): No intracardiac source of embolism detected on this transthoracic study. Specifically, no left ventricular thrombus is seen, but the  left ventricular syst olic function is severely decreased, thereby increasing the  risk of intracardiac thrombus. FINDINGS  Left Ventricle: Left ventricular ejection fraction, by estimation, is 20 to 25%. The left ventricle has severely decreased function. The left ventricle demonstrates global hypokinesis. The left ventricular internal cavity size was moderately dilated. There is no left ventricular hypertrophy. Left ventricular diastolic parameters are consistent with Grade II diastolic dysfunction (pseudonormalization). Elevated left atrial pressure. Right Ventricle: The right ventricular size is normal. No increase in right ventricular wall thickness. Right ventricular systolic function is normal. There is mildly elevated pulmonary artery systolic pressure. The tricuspid regurgitant velocity is 2.61  m/s, and with an assumed right atrial pressure of 3 mmHg, the estimated right ventricular systolic pressure is 30.2 mmHg. Left Atrium: Left atrial size was mildly dilated. Right Atrium: Right atrial size was normal in size. Pericardium: There is no evidence of pericardial effusion. Mitral Valve: The mitral valve is normal in structure and function. Moderate mitral valve regurgitation, with centrally-directed jet. Tricuspid Valve: The tricuspid valve is normal in structure. Tricuspid valve regurgitation is trivial. Aortic Valve: The aortic valve is normal in structure and function. Aortic valve regurgitation is not visualized. Pulmonic Valve: The pulmonic valve was normal in structure. Pulmonic valve regurgitation is not visualized. Aorta: The aortic root and ascending aorta are structurally normal, with no evidence of dilitation. IAS/Shunts: No atrial level shunt detected by color flow Doppler.  LEFT VENTRICLE PLAX 2D LVIDd:         6.87 cm      Diastology LVIDs:         6.09 cm      LV e' lateral:   4.62 cm/s LV PW:         0.96 cm      LV E/e' lateral: 14.6 LV IVS:        0.98 cm      LV e' medial:    5.11 cm/s LVOT diam:     1.90 cm      LV E/e' medial:  13.2 LV SV:         36 LV SV Index:   19  LVOT Area:     2.84 cm  LV Volumes (MOD) LV vol d, MOD A2C: 139.0 ml LV vol d, MOD A4C: 248.0 ml LV vol s, MOD A2C: 94.5 ml LV vol s, MOD A4C: 186.0 ml LV SV MOD A2C:     44.5 ml LV SV MOD A4C:     248.0 ml LV SV MOD BP:      57.3 ml RIGHT VENTRICLE RV S prime:     15.20 cm/s TAPSE (M-mode): 2.2 cm LEFT ATRIUM             Index       RIGHT ATRIUM           Index LA diam:        4.20 cm 2.16 cm/m  RA Area:     16.30 cm LA Vol (A2C):   43.6 ml 22.44 ml/m RA Volume:   40.90 ml  21.05 ml/m LA Vol (A4C):   74.7 ml 38.45 ml/m LA Biplane Vol: 59.7 ml 30.73 ml/m  AORTIC VALVE LVOT Vmax:   76.40 cm/s LVOT Vmean:  54.400 cm/s LVOT VTI:    0.128 m  AORTA Ao Root diam: 3.10 cm MITRAL VALVE               TRICUSPID VALVE MV Area (PHT): 3.31 cm  TR Peak grad:   27.2 mmHg MV Decel Time: 229 msec    TR Vmax:        261.00 cm/s MV E velocity: 67.30 cm/s MV A velocity: 57.80 cm/s  SHUNTS MV E/A ratio:  1.16        Systemic VTI:  0.13 m                            Systemic Diam: 1.90 cm Thurmon Fair MD Electronically signed by Thurmon Fair MD Signature Date/Time: 08/10/2019/3:56:16 PM    Final    ECHO TEE  Result Date: 08/12/2019    TRANSESOPHOGEAL ECHO REPORT   Patient Name:   FINNIS COLEE [facility] Date of Exam: 08/12/2019 Medical Rec #:  [id number]       Height:       71.0 in Accession #:    [id number]      Weight:       165.0 lb Date of Birth:  05-Apr-1966       BSA:          1.943 m Patient Age:    53 years        BP:           122/80 mmHg Patient Gender: M               HR:           94 bpm. Exam Location:  Inpatient Procedure: Transesophageal Echo, Limited Color Doppler and Cardiac Doppler Indications:     Stroke 434.91 / I63.9  History:         Patient has prior history of Echocardiogram examinations, most                  recent 08/10/2019. Stroke; Risk Factors:Current Smoker.  Sonographer:     Leta Jungling RDCS Referring Phys:  [id number] Dois Davenport BERGE Diagnosing Phys: Weston Brass MD PROCEDURE: After discussion of  the risks and benefits of a TEE, an informed consent was obtained from the patient. TEE procedure time was 18 minutes. The transesophogeal probe was passed without difficulty through the esophogus of the patient. Imaged were obtained with the patient in a left lateral decubitus position. Local oropharyngeal anesthetic was provided with Cetacaine. Sedation performed by different physician. The patient was monitored while under deep sedation. Image quality was good. The patient's vital signs; including heart rate, blood pressure, and oxygen saturation; remained stable throughout the procedure. The patient developed no complications during the procedure. IMPRESSIONS  1. Left ventricular ejection fraction, by estimation, is 20 to 25%. The left ventricle has severely decreased function. The left ventricle demonstrates global hypokinesis. The left ventricular internal cavity size was moderately dilated.  2. Right ventricular systolic function is normal. The right ventricular size is normal.  3. Left atrial size was mildly dilated. No left atrial/left atrial appendage thrombus was detected. The LAA emptying velocity was 105 cm/s.  4. The mitral valve is grossly normal. Mild mitral valve regurgitation.  5. The aortic valve is tricuspid. Aortic valve regurgitation is trivial.  6. There is borderline dilatation of the ascending aorta measuring 38 mm. There is mild (Grade II) atheroma plaque involving the transverse and descending aorta.  7. Evidence of atrial level shunting detected by color flow Doppler. Agitated saline contrast bubble study was positive with shunting observed within 3-6 cardiac cycles suggestive of interatrial shunt. There is a tiny patent foramen ovale. FINDINGS  Left Ventricle: Left ventricular  ejection fraction, by estimation, is 20 to 25%. The left ventricle has severely decreased function. The left ventricle demonstrates global hypokinesis. The left ventricular internal cavity size was moderately  dilated. There is no left ventricular hypertrophy. Right Ventricle: The right ventricular size is normal. No increase in right ventricular wall thickness. Right ventricular systolic function is normal. Left Atrium: Left atrial size was mildly dilated. No left atrial/left atrial appendage thrombus was detected. The LAA emptying velocity was 105 cm/s. Right Atrium: Right atrial size was normal in size. Pericardium: Trivial pericardial effusion is present. Mitral Valve: The mitral valve is grossly normal. Mild mitral valve regurgitation. Tricuspid Valve: The tricuspid valve is normal in structure. Tricuspid valve regurgitation is trivial. Aortic Valve: The aortic valve is tricuspid. Aortic valve regurgitation is trivial. Pulmonic Valve: The pulmonic valve was normal in structure. Pulmonic valve regurgitation is not visualized. Aorta: The aortic root is normal in size and structure. There is borderline dilatation of the ascending aorta measuring 38 mm. There is mild (Grade II) atheroma plaque involving the transverse and descending aorta. IAS/Shunts: There is redundancy of the interatrial septum. Evidence of atrial level shunting detected by color flow Doppler. Agitated saline contrast was given intravenously to evaluate for intracardiac shunting. Agitated saline contrast bubble study was  positive with shunting observed within 3-6 cardiac cycles suggestive of interatrial shunt. A tiny patent foramen ovale is detected.   AORTA Ao Sinus diam: 3.30 cm Ao Asc diam:   3.80 cm Weston Brass MD Electronically signed by Weston Brass MD Signature Date/Time: 08/12/2019/9:26:01 PM    Final    CT HEAD CODE STROKE WO CONTRAST  Addendum Date: 08/09/2019   ADDENDUM REPORT: 08/09/2019 22:25 ADDENDUM: Findings omitted from the impression. 7.0 x 2.3 cm cutaneous/subcutaneous soft tissue mass along the lateral aspect of the left temporal bone, involving the left external ear, highly suspicious for malignancy. This finding was  discussed with Dr. Laurence Slate at the time of the original dictation at 5:54 p.m. on 08/09/19. Electronically Signed   By: Jackey Loge DO   On: 08/09/2019 22:25   Addendum Date: 08/09/2019   ADDENDUM REPORT: 08/09/2019 17:57 ADDENDUM: These results were called by telephone at the time of interpretation on 08/09/2019 at 5:54 pm to provider Dr. Laurence Slate, who verbally acknowledged these results. Electronically Signed   By: Jackey Loge DO   On: 08/09/2019 17:57   Result Date: 08/09/2019 CLINICAL DATA:  Code stroke.  Aphasia EXAM: CT HEAD WITHOUT CONTRAST TECHNIQUE: Contiguous axial images were obtained from the base of the skull through the vertex without intravenous contrast. COMPARISON:  No pertinent prior studies available for comparison. FINDINGS: Brain: There is no evidence of acute intracranial hemorrhage. There is abnormal hypodensity consistent with acute/early subacute infarction involving the mid left frontal lobe and left frontal operculum. There is no significant mass effect. No evidence of intracranial mass. No midline shift or extra-axial fluid collection. Vascular: No hyperdense vessel is identified. Skull: Normal. Negative for fracture or focal lesion. Sinuses/Orbits: Visualized orbits demonstrate no acute abnormality. Paranasal sinus disease. Most notably, there is near complete opacification left maxillary sinus. Right maxillary sinus mucous retention cyst. Scattered opacification of bilateral ethmoid air cells. No significant mastoid effusion. Other: There is a cutaneous/subcutaneous mass along the lateral aspect of the left temporal bone and involving the external ear which measures 7.0 x 2.3 cm in transaxial dimensions, likely reflecting malignancy (series 3, image 9) ASPECTS (Alberta Stroke Program Early CT Score) - Ganglionic level infarction (caudate, lentiform nuclei, internal capsule,  insula, M1-M3 cortex): Seven - Supraganglionic infarction (M4-M6 cortex): 2 (point adducted for involvement of the left  M5 territory) Total score (0-10 with 10 being normal):  9 IMPRESSION: Acute/early subacute ischemic infarction within the mid left frontal lobe and left frontal operculum. No evidence of hemorrhagic conversion. No significant mass effect. ASPECTS 9 Electronically Signed: By: Jackey Loge DO On: 08/09/2019 17:48    ECHOCARDIOGRAM 08/10/19 IMPRESSIONS    1. Left ventricular ejection fraction, by estimation, is 20 to 25%. The  left ventricle has severely decreased function. The left ventricle  demonstrates global hypokinesis. The left ventricular internal cavity size  was moderately dilated. Left  ventricular diastolic parameters are consistent with Grade II diastolic  dysfunction (pseudonormalization). Elevated left atrial pressure.  2. Right ventricular systolic function is normal. The right ventricular  size is normal. There is mildly elevated pulmonary artery systolic  pressure. The estimated right ventricular systolic pressure is 30.2 mmHg.  3. Left atrial size was mildly dilated.  4. The mitral valve is normal in structure and function. Moderate mitral  valve regurgitation.  5. The aortic valve is normal in structure and function. Aortic valve  regurgitation is not visualized.   Comparison(s): No prior Echocardiogram.   Conclusion(s)/Recommendation(s): No intracardiac source of embolism  detected on this transthoracic study. Specifically, no left ventricular  thrombus is seen, but the left ventricular syst olic function is severely  decreased, thereby increasing the risk  of intracardiac thrombus.   FINDINGS  Left Ventricle: Left ventricular ejection fraction, by estimation, is 20  to 25%. The left ventricle has severely decreased function. The left  ventricle demonstrates global hypokinesis. The left ventricular internal  cavity size was moderately dilated.  There is no left ventricular hypertrophy. Left ventricular diastolic  parameters are consistent with Grade II  diastolic dysfunction  (pseudonormalization). Elevated left atrial pressure.   Right Ventricle: The right ventricular size is normal. No increase in  right ventricular wall thickness. Right ventricular systolic function is  normal. There is mildly elevated pulmonary artery systolic pressure. The  tricuspid regurgitant velocity is 2.61  m/s, and with an assumed right atrial pressure of 3 mmHg, the estimated  right ventricular systolic pressure is 30.2 mmHg.   Left Atrium: Left atrial size was mildly dilated.   Right Atrium: Right atrial size was normal in size.   Pericardium: There is no evidence of pericardial effusion.   Mitral Valve: The mitral valve is normal in structure and function.  Moderate mitral valve regurgitation, with centrally-directed jet.   Tricuspid Valve: The tricuspid valve is normal in structure. Tricuspid  valve regurgitation is trivial.   Aortic Valve: The aortic valve is normal in structure and function. Aortic  valve regurgitation is not visualized.   Pulmonic Valve: The pulmonic valve was normal in structure. Pulmonic valve  regurgitation is not visualized.   Aorta: The aortic root and ascending aorta are structurally normal, with  no evidence of dilitation.   IAS/Shunts: No atrial level shunt detected by color flow Doppler.     LEFT VENTRICLE  PLAX 2D  LVIDd:     6.87 cm   Diastology  LVIDs:     6.09 cm   LV e' lateral:  4.62 cm/s  LV PW:     0.96 cm   LV E/e' lateral: 14.6  LV IVS:    0.98 cm   LV e' medial:  5.11 cm/s  LVOT diam:   1.90 cm   LV E/e' medial: 13.2  LV SV:  36  LV SV Index:  19  LVOT Area:   2.84 cm    LV Volumes (MOD)  LV vol d, MOD A2C: 139.0 ml  LV vol d, MOD A4C: 248.0 ml  LV vol s, MOD A2C: 94.5 ml  LV vol s, MOD A4C: 186.0 ml  LV SV MOD A2C:   44.5 ml  LV SV MOD A4C:   248.0 ml  LV SV MOD BP:   57.3 ml   RIGHT VENTRICLE  RV S prime:   15.20 cm/s  TAPSE  (M-mode): 2.2 cm   LEFT ATRIUM       Index    RIGHT ATRIUM      Index  LA diam:    4.20 cm 2.16 cm/m RA Area:   16.30 cm  LA Vol (A2C):  43.6 ml 22.44 ml/m RA Volume:  40.90 ml 21.05 ml/m  LA Vol (A4C):  74.7 ml 38.45 ml/m  LA Biplane Vol: 59.7 ml 30.73 ml/m  AORTIC VALVE  LVOT Vmax:  76.40 cm/s  LVOT Vmean: 54.400 cm/s  LVOT VTI:  0.128 m    AORTA  Ao Root diam: 3.10 cm   MITRAL VALVE        TRICUSPID VALVE  MV Area (PHT): 3.31 cm  TR Peak grad:  27.2 mmHg  MV Decel Time: 229 msec  TR Vmax:    261.00 cm/s  MV E velocity: 67.30 cm/s  MV A velocity: 57.80 cm/s SHUNTS  MV E/A ratio: 1.16    Systemic VTI: 0.13 m               Systemic Diam: 1.90 cm   TEE ECHOCARDIOGRAM 08/12/19 IMPRESSIONS    1. Left ventricular ejection fraction, by estimation, is 20 to 25%. The  left ventricle has severely decreased function. The left ventricle  demonstrates global hypokinesis. The left ventricular internal cavity size  was moderately dilated.  2. Right ventricular systolic function is normal. The right ventricular  size is normal.  3. Left atrial size was mildly dilated. No left atrial/left atrial  appendage thrombus was detected. The LAA emptying velocity was 105 cm/s.  4. The mitral valve is grossly normal. Mild mitral valve regurgitation.  5. The aortic valve is tricuspid. Aortic valve regurgitation is trivial.  6. There is borderline dilatation of the ascending aorta measuring 38 mm.  There is mild (Grade II) atheroma plaque involving the transverse and  descending aorta.  7. Evidence of atrial level shunting detected by color flow Doppler.  Agitated saline contrast bubble study was positive with shunting observed  within 3-6 cardiac cycles suggestive of interatrial shunt. There is a tiny  patent foramen ovale.   FINDINGS  Left Ventricle: Left ventricular ejection fraction, by estimation, is 20  to 25%.  The left ventricle has severely decreased function. The left  ventricle demonstrates global hypokinesis. The left ventricular internal  cavity size was moderately dilated.  There is no left ventricular hypertrophy.   Right Ventricle: The right ventricular size is normal. No increase in  right ventricular wall thickness. Right ventricular systolic function is  normal.   Left Atrium: Left atrial size was mildly dilated. No left atrial/left  atrial appendage thrombus was detected. The LAA emptying velocity was 105  cm/s.   Right Atrium: Right atrial size was normal in size.   Pericardium: Trivial pericardial effusion is present.   Mitral Valve: The mitral valve is grossly normal. Mild mitral valve  regurgitation.   Tricuspid Valve: The tricuspid valve is  normal in structure. Tricuspid  valve regurgitation is trivial.   Aortic Valve: The aortic valve is tricuspid. Aortic valve regurgitation is  trivial.   Pulmonic Valve: The pulmonic valve was normal in structure. Pulmonic valve  regurgitation is not visualized.   Aorta: The aortic root is normal in size and structure. There is  borderline dilatation of the ascending aorta measuring 38 mm. There is  mild (Grade II) atheroma plaque involving the transverse and descending  aorta.   IAS/Shunts: There is redundancy of the interatrial septum. Evidence of  atrial level shunting detected by color flow Doppler. Agitated saline  contrast was given intravenously to evaluate for intracardiac shunting.  Agitated saline contrast bubble study was  positive with shunting observed within 3-6 cardiac cycles suggestive of  interatrial shunt. A tiny patent foramen ovale is detected.       AORTA  Ao Sinus diam: 3.30 cm  Ao Asc diam:  3.80 cm   Subjective: Seen and examined at bedside and was feeling well.  Had no complaints but wanted respite no chest pain, lightheadedness or dizziness but no other concerns or complaints at this time and  ready to go home.  Discharge Exam: Vitals:   08/14/19 1131 08/14/19 1542  BP: 120/81 99/69  Pulse: (!) 58 64  Resp: 20 16  Temp: 98.5 F (36.9 C) 98.7 F (37.1 C)  SpO2: 99% 98%   Vitals:   08/14/19 0413 08/14/19 0830 08/14/19 1131 08/14/19 1542  BP: 100/64 123/79 120/81 99/69  Pulse: 72 72 (!) 58 64  Resp: 17 16 20 16   Temp: 98.2 F (36.8 C) 98.9 F (37.2 C) 98.5 F (36.9 C) 98.7 F (37.1 C)  TempSrc: Oral Oral Oral Oral  SpO2: 99% 100% 99% 98%  Weight:      Height:       General: Pt is alert, awake, not in acute distress; left ear is covered Cardiovascular: RRR, S1/S2 +, no rubs, no gallops Respiratory: Mildly diminished bilaterally, no wheezing, no rhonchi Abdominal: Soft, NT, ND, bowel sounds + Extremities: no appreciable edema, no cyanosis  The results of significant diagnostics from this hospitalization (including imaging, microbiology, ancillary and laboratory) are listed below for reference.    Microbiology: Recent Results (from the past 240 hour(s))  SARS CORONAVIRUS 2 (TAT 6-24 HRS) Nasopharyngeal     Status: None   Collection Time: 08/09/19  9:09 PM   Specimen: Nasopharyngeal  Result Value Ref Range Status   SARS Coronavirus 2 NEGATIVE NEGATIVE Final    Comment: (NOTE) SARS-CoV-2 target nucleic acids are NOT DETECTED. The SARS-CoV-2 RNA is generally detectable in upper and lower respiratory specimens during the acute phase of infection. Negative results do not preclude SARS-CoV-2 infection, do not rule out co-infections with other pathogens, and should not be used as the sole basis for treatment or other patient management decisions. Negative results must be combined with clinical observations, patient history, and epidemiological information. The expected result is Negative. Fact Sheet for Patients: 10/09/19 Fact Sheet for Healthcare Providers: HairSlick.no This test is not yet approved  or cleared by the [employer]ier[employer]ir[employer]ir.[employer] FDA and  has been authorized for detection and/or diagnosis of SARS-CoV-2 by FDA under an Emergency Use Authorization (EUA). This EUA will remain  in effect (meaning this test can be used) for the duration of the COVID-19 declaration under Section 56 4(b)(1) of the Act, 21 U.S.C. section 360bbb-3(b)(1), unless the authorization is terminated or revoked sooner. Performed at [facility], [address],  [place], [place] [address]   Culture, blood (Routine X 2) w Reflex to ID Panel     Status: None (Preliminary result)   Collection Time: 08/10/19  9:55 AM   Specimen: BLOOD LEFT ARM  Result Value Ref Range Status   Specimen Description BLOOD LEFT ARM  Final   Special Requests   Final    BOTTLES DRAWN AEROBIC AND ANAEROBIC Blood Culture adequate volume   Culture   Final    NO GROWTH 4 DAYS Performed at [facility], [address] [place], [place] [address]    Report Status PENDING  Incomplete  Culture, blood (Routine X 2) w Reflex to ID Panel     Status: None (Preliminary result)   Collection Time: 08/10/19 10:03 AM   Specimen: BLOOD RIGHT ARM  Result Value Ref Range Status   Specimen Description BLOOD RIGHT ARM  Final   Special Requests   Final    BOTTLES DRAWN AEROBIC AND ANAEROBIC Blood Culture adequate volume   Culture   Final    NO GROWTH 4 DAYS Performed at [facility], [address] [place], [place] [address]    Report Status PENDING  Incomplete    Labs: BNP (last 3 results) No results for input(s): BNP in the last 8760 hours. Basic Metabolic Panel: Recent Labs  Lab 08/09/19 1737 08/09/19 1738 08/11/19 0243 08/12/19 0435 08/14/19 0948  NA 139 141 138 140 135  K 3.6 3.5 3.6 4.0 4.1  CL 104 103 105 105 103  CO2 26  --  25 26 24   GLUCOSE 122* 118* 107* 101* 95  BUN 10 11 9 14 18   CREATININE 1.19 1.10 1.17 1.20 1.20  CALCIUM 9.0  --  8.9 9.1 8.7*  MG  --   --   --   --  2.1  PHOS  --   --   --   --  3.0    Liver Function Tests: Recent Labs  Lab 08/09/19 1737 08/14/19 0948  AST 23 17  ALT 13 10  ALKPHOS 84 80  BILITOT 0.9 0.7  PROT 7.3 6.9  ALBUMIN 4.0 3.4*   No results for input(s): LIPASE, AMYLASE in the last 168 hours. No results for input(s): AMMONIA in the last 168 hours. CBC: Recent Labs  Lab 08/09/19 1737 08/09/19 1738 08/11/19 0243 08/12/19 0435 08/14/19 0948  WBC 7.1  --  5.6 6.9 6.9  NEUTROABS 4.3  --   --   --  3.1  HGB 14.6 14.6 15.3 15.8 15.4  HCT 42.7 43.0 45.5 46.6 45.2  MCV 94.1  --  93.0 93.6 92.2  PLT 213  --  210 227 219   Cardiac Enzymes: No results for input(s): CKTOTAL, CKMB, CKMBINDEX, TROPONINI in the last 168 hours. BNP: Invalid input(s): POCBNP CBG: Recent Labs  Lab 08/09/19 1732  GLUCAP 130*   D-Dimer No results for input(s): DDIMER in the last 72 hours. Hgb A1c No results for input(s): HGBA1C in the last 72 hours. Lipid Profile No results for input(s): CHOL, HDL, LDLCALC, TRIG, CHOLHDL, LDLDIRECT in the last 72 hours. Thyroid function studies No results for input(s): TSH, T4TOTAL, T3FREE, THYROIDAB in the last 72 hours.  Invalid input(s): FREET3 Anemia work up No results for input(s): VITAMINB12, FOLATE, FERRITIN, TIBC, IRON, RETICCTPCT in the last 72 hours. Urinalysis    Component Value Date/Time   COLORURINE STRAW (A) 08/09/2019 2117   APPEARANCEUR CLEAR 08/09/2019 2117   LABSPEC 1.034 (H) 08/09/2019 2117  PHURINE 6.0 08/09/2019 2117   GLUCOSEU NEGATIVE 08/09/2019 2117   HGBUR NEGATIVE 08/09/2019 2117   BILIRUBINUR NEGATIVE 08/09/2019 2117   KETONESUR NEGATIVE 08/09/2019 2117   PROTEINUR NEGATIVE 08/09/2019 2117   NITRITE NEGATIVE 08/09/2019 2117   LEUKOCYTESUR TRACE (A) 08/09/2019 2117   Sepsis Labs Invalid input(s): PROCALCITONIN,  WBC,  LACTICIDVEN Microbiology Recent Results (from the past 240 hour(s))  SARS CORONAVIRUS 2 (TAT 6-24 HRS) Nasopharyngeal     Status: None   Collection Time: 08/09/19  9:09 PM    Specimen: Nasopharyngeal  Result Value Ref Range Status   SARS Coronavirus 2 NEGATIVE NEGATIVE Final    Comment: (NOTE) SARS-CoV-2 target nucleic acids are NOT DETECTED. The SARS-CoV-2 RNA is generally detectable in upper and lower respiratory specimens during the acute phase of infection. Negative results do not preclude SARS-CoV-2 infection, do not rule out co-infections with other pathogens, and should not be used as the sole basis for treatment or other patient management decisions. Negative results must be combined with clinical observations, patient history, and epidemiological information. The expected result is Negative. Fact Sheet for Patients: HairSlick.no Fact Sheet for Healthcare Providers: quierodirigir.com This test is not yet approved or cleared by the [place] FDA and  has been authorized for detection and/or diagnosis of SARS-CoV-2 by FDA under an Emergency Use Authorization (EUA). This EUA will remain  in effect (meaning this test can be used) for the duration of the COVID-19 declaration under Section 56 4(b)(1) of the Act, 21 U.S.C. section 360bbb-3(b)(1), unless the authorization is terminated or revoked sooner. Performed at [facility], [address], [place], [place] [address]   Culture, blood (Routine X 2) w Reflex to ID Panel     Status: None (Preliminary result)   Collection Time: 08/10/19  9:55 AM   Specimen: BLOOD LEFT ARM  Result Value Ref Range Status   Specimen Description BLOOD LEFT ARM  Final   Special Requests   Final    BOTTLES DRAWN AEROBIC AND ANAEROBIC Blood Culture adequate volume   Culture   Final    NO GROWTH 4 DAYS Performed at [facility], [address], [place], [place] [address]    Report Status PENDING  Incomplete  Culture, blood (Routine X 2) w Reflex to ID Panel     Status: None (Preliminary result)   Collection Time: 08/10/19 10:03 AM   Specimen: BLOOD  RIGHT ARM  Result Value Ref Range Status   Specimen Description BLOOD RIGHT ARM  Final   Special Requests   Final    BOTTLES DRAWN AEROBIC AND ANAEROBIC Blood Culture adequate volume   Culture   Final    NO GROWTH 4 DAYS Performed at [facility], [address], [place], [place] [address]    Report Status PENDING  Incomplete   Time coordinating discharge: 35 minutes  SIGNED:  Merlene Laughter, DO [facility] 08/14/2019, 3:58 PM Pager is on AMION  If 7PM-7AM, please contact night-coverage [email or web address] Password [email or web address]

## 2019-08-14 NOTE — Discharge Instructions (Signed)

## 2019-08-14 NOTE — Progress Notes (Signed)
Benefits check submitted for Eliquis. Pt will receive the 30 day free card and can also receive the $10 co pay card since he has commercial insurance. TOC following.

## 2019-08-14 NOTE — Progress Notes (Signed)
Progress Note  Patient Name: David [name] Date of Encounter: 08/14/2019  Primary Cardiologist: Kirk Ruths, MD   Subjective   He is feeling slightly better today. No chest pain.  Inpatient Medications    Scheduled Meds: . apixaban  5 mg Oral BID  . atorvastatin  80 mg Oral q1800  . carvedilol  3.125 mg Oral BID WC  . losartan  25 mg Oral Daily  . metoprolol tartrate      . nitroGLYCERIN       Continuous Infusions:  PRN Meds: acetaminophen **OR** acetaminophen (TYLENOL) oral liquid 160 mg/5 mL **OR** acetaminophen, senna-docusate   Vital Signs    Vitals:   08/14/19 0001 08/14/19 0413 08/14/19 0830 08/14/19 1131  BP: 121/72 100/64 123/79 120/81  Pulse: 73 72  (!) 58  Resp: 18 17 16 20   Temp: 98.9 F (37.2 C) 98.2 F (36.8 C) 98.9 F (37.2 C) 98.5 F (36.9 C)  TempSrc: Oral Oral Oral Oral  SpO2: 96% 99% 100% 99%  Weight:      Height:        Intake/Output Summary (Last 24 hours) at 08/14/2019 1500 Last data filed at 08/14/2019 0745 Gross per 24 hour  Intake --  Output 430 ml  Net -430 ml   Last 3 Weights 08/12/2019 05/13/2018 01/02/2018  Weight (lbs) 165 lb 165 lb 170 lb  Weight (kg) 74.844 kg 74.844 kg 77.111 kg      Telemetry    NSR with frequent PVCs, couplets and 1 run of nonsustained ventricular tachycardia lasting 15 beats - Personally Reviewed  ECG    NSR without significant ST-T wave changes - Personally Reviewed  Physical Exam   GEN: No acute distress.   Neck: No JVD Cardiac: RRR, no murmurs, rubs, or gallops.  Respiratory: Clear to auscultation bilaterally. GI: Soft, nontender, non-distended  MS: No edema; No deformity. Neuro:  Nonfocal  Psych: Normal affect   Labs    High Sensitivity Troponin:   Recent Labs  Lab 08/11/19 1226  TROPONINIHS 7     Chemistry Recent Labs  Lab 08/09/19 1737 08/09/19 1738 08/11/19 0243 08/12/19 0435 08/14/19 0948  NA 139   < > 138 140 135  K 3.6   < > 3.6 4.0 4.1  CL 104   < > 105 105 103   CO2 26   < > 25 26 24   GLUCOSE 122*   < > 107* 101* 95  BUN 10   < > 9 14 18   CREATININE 1.19   < > 1.17 1.20 1.20  CALCIUM 9.0   < > 8.9 9.1 8.7*  PROT 7.3  --   --   --  6.9  ALBUMIN 4.0  --   --   --  3.4*  AST 23  --   --   --  17  ALT 13  --   --   --  10  ALKPHOS 84  --   --   --  80  BILITOT 0.9  --   --   --  0.7  GFRNONAA >60   < > >60 >60 >60  GFRAA >60   < > >60 >60 >60  ANIONGAP 9   < > 8 9 8    < > = values in this interval not displayed.    Hematology Recent Labs  Lab 08/11/19 0243 08/12/19 0435 08/14/19 0948  WBC 5.6 6.9 6.9  RBC 4.89 4.98 4.90  HGB 15.3 15.8 15.4  HCT 45.5  46.6 45.2  MCV 93.0 93.6 92.2  MCH 31.3 31.7 31.4  MCHC 33.6 33.9 34.1  RDW 12.0 12.0 11.8  PLT 210 227 219   BNPNo results for input(s): BNP, PROBNP in the last 168 hours.   DDimer No results for input(s): DDIMER in the last 168 hours.   Radiology    ECHO TEE 08/12/2019  1. Left ventricular ejection fraction, by estimation, is 20 to 25%. The  left ventricle has severely decreased function. The left ventricle  demonstrates global hypokinesis. The left ventricular internal cavity size  was moderately dilated.  2. Right ventricular systolic function is normal. The right ventricular  size is normal.  3. Left atrial size was mildly dilated. No left atrial/left atrial  appendage thrombus was detected. The LAA emptying velocity was 105 cm/s.  4. The mitral valve is grossly normal. Mild mitral valve regurgitation.  5. The aortic valve is tricuspid. Aortic valve regurgitation is trivial.  6. There is borderline dilatation of the ascending aorta measuring 38 mm.  There is mild (Grade II) atheroma plaque involving the transverse and  descending aorta.  7. Evidence of atrial level shunting detected by color flow Doppler.  Agitated saline contrast bubble study was positive with shunting observed  within 3-6 cardiac cycles suggestive of interatrial shunt. There is a tiny  patent  foramen ovale.   TTE 08/10/2019 demonstrates global hypokinesis. The left ventricular internal cavity size  was moderately dilated. Left  ventricular diastolic parameters are consistent with Grade II diastolic  dysfunction (pseudonormalization). Elevated left atrial pressure.  2. Right ventricular systolic function is normal. The right ventricular  size is normal. There is mildly elevated pulmonary artery systolic  pressure. The estimated right ventricular systolic pressure is 30.2 mmHg.  3. Left atrial size was mildly dilated.  4. The mitral valve is normal in structure and function. Moderate mitral  valve regurgitation.  5. The aortic valve is normal in structure and function. Aortic valve  regurgitation is not visualized.    Patient Profile     54 y.o. male with PMH of tobacco abuse and probable untreated HTN presented with R facial droop and slurred speech. CT showed acute/subacute infarction in the mid L frontal and left frontal operculum. Also noted to have a 7x2.3 cm soft tissue mass along the lateral aspect of L temporal bone involving L external ear concerning for malignancy. CTA of head and neck showed high grade stenosis of prox L MCA, 58mm aneurysm arising from cavernous L ICA. MRI showed L MCA infarction consistent with embolic stroke. Echo showed EF 20-25% with global hypokinesis and grade 2 DD.   Assessment & Plan    1. Cardiomyopathy  - single episode of chest pain a week ago lasted 15 min, Hs troponin negative  - EF 20-25% on echocardiogram.   - plan ischemic eval once recovers from CVA, given his thin body size, he would be a good candidate for coronary CT. Once CHF med fully titrated, will to repeat echo in 3 months, he will require a Lifevest at discharge Dennis Bast has been called) as he has runs of nsVT  - he is underwent cardiac MRI yesterday that showed inferior wall scarring consistent with prior myocardial infarction  -The patient underwent coronary CTA to  date it was completely normal, his myocardial infarction was most probably embolic, his MRI yesterday showed heavy/in the apex consistent with prethrombotic state, there was no definite clot however this should be treated as is a thrombus and should be  anticoagulated.  The patient was already started on Eliquis by neurology service, we will continue.  - on low dose coreg and losartan, hesitant to increase given permissive hypertension. Will need followup in 2-3 weeks with Dr. [name] or his APP. Plan to switch losartan to entresto as outpatient.   - nsVT - 15 beats - no room to increase carvedilol, Life Vest at discharge, possible ICD in 3 months  2. L MCA CVA  - TEE showed PFO, no thrombus, EP consulted for loop recorder and felt the patient is not a candidate given low EF and possibly needing ICD in the future if EF does not improve, possibly discharge on event monitor.  3. Moderate MR: noted on echo, followup as outpatient, mild MR on TEE  4. HTN: suspect uncontrolled given no prior diagnosis. CHF regimen  5. Tobacco abuse: cessation advised  6. 51mm aneurysm in the cavernous L ICA: noted on CT  7. L cranial mass: 7 x 2.3 cm: further evaluation by primary care  The patient has received LifeVest yesterday, he can be discharged today on current medications, we will arrange for follow-up next week in our clinic and for repeat echo in 3 months.  For questions or updates, please contact [facility] Please consult [email or web address] for contact info under     Signed, Tobias Alexander, MD  08/14/2019, 3:00 PM

## 2019-08-14 NOTE — TOC Benefit Eligibility Note (Signed)
Transition of Care [facility]) Benefit Eligibility Note    Patient Details  Name: David [name] MRN: [id number] Date of Birth: March 28, 1966   Medication/Dose: ELIQUIS  5 MG BID  Covered?: Yes  Tier: (NO TIER WITH PLAN)  Prescription Coverage Preferred Pharmacy: [facility]  Spoke with Person/Company/Phone Number:: KATIY @ [employer] # [phone] OPT- 3  Co-Pay: $ 149.99  Prior Approval: No  Deductible: Met       Memory Argue Phone Number: 08/14/2019, 11:52 AM

## 2019-08-15 ENCOUNTER — Other Ambulatory Visit: Payer: Self-pay | Admitting: Cardiology

## 2019-08-15 DIAGNOSIS — I639 Cerebral infarction, unspecified: Secondary | ICD-10-CM

## 2019-08-15 LAB — CULTURE, BLOOD (ROUTINE X 2)
Culture: NO GROWTH
Culture: NO GROWTH
Special Requests: ADEQUATE
Special Requests: ADEQUATE

## 2019-08-15 NOTE — Telephone Encounter (Signed)
Patient had question on how to change battery.  [employer] was called and they walked them through the process.

## 2019-08-15 NOTE — Telephone Encounter (Signed)
Patient's girlfriend called would like a call back.

## 2019-08-21 ENCOUNTER — Ambulatory Visit (INDEPENDENT_AMBULATORY_CARE_PROVIDER_SITE_OTHER): Payer: Managed Care, Other (non HMO) | Admitting: Registered Nurse

## 2019-08-21 ENCOUNTER — Other Ambulatory Visit: Payer: Self-pay

## 2019-08-21 ENCOUNTER — Encounter: Payer: Self-pay | Admitting: Registered Nurse

## 2019-08-21 VITALS — BP 125/82 | HR 70 | Temp 98.1°F | Ht 71.0 in | Wt 156.2 lb

## 2019-08-21 DIAGNOSIS — Z13220 Encounter for screening for lipoid disorders: Secondary | ICD-10-CM

## 2019-08-21 DIAGNOSIS — Z1329 Encounter for screening for other suspected endocrine disorder: Secondary | ICD-10-CM

## 2019-08-21 DIAGNOSIS — Z13228 Encounter for screening for other metabolic disorders: Secondary | ICD-10-CM | POA: Diagnosis not present

## 2019-08-21 DIAGNOSIS — Z130 Encounter for screening for diseases of the blood and blood-forming organs and certain disorders involving the immune mechanism: Secondary | ICD-10-CM | POA: Diagnosis not present

## 2019-08-21 NOTE — Patient Instructions (Signed)
° ° ° °  If you have lab work done today you will be contacted with your lab results within the next 2 weeks.  If you have not heard from us then please contact us. The fastest way to get your results is to register for My Chart. ° ° °IF you received an x-ray today, you will receive an invoice from [facility]. Please contact [facility] at [phone] with questions or concerns regarding your invoice.  ° °IF you received labwork today, you will receive an invoice from [facility]. Please contact [facility] at [phone] with questions or concerns regarding your invoice.  ° °Our billing staff will not be able to assist you with questions regarding bills from these companies. ° °You will be contacted with the lab results as soon as they are available. The fastest way to get your results is to activate your My Chart account. Instructions are located on the last page of this paperwork. If you have not heard from us regarding the results in 2 weeks, please contact this office. °  ° ° ° °

## 2019-08-21 NOTE — Progress Notes (Signed)
New Patient Office Visit  Subjective:  Patient ID: David [name], male    DOB: 08-Jan-1966  Age: 54 y.o. MRN: [id number]  CC:  Chief Complaint  Patient presents with  . New Patient (Initial Visit)    establish care Physical .    HPI David [name] presents for hosp f/u , establish care, physical  Recently was admitted with stroke, admits to poor lifestyle before but has a lot of goo energy going towards preventing future adverse cv events  Today, he would like to get a clean bill of health from primary care, as he's already set up follow up with neurology, cardiology, and ENT for an ear mass.  No major concerns today, though he is admittedly nervous.  Past Medical History:  Diagnosis Date  . Acute ischemic left MCA stroke ([place])    a. 08/2019 MRI: Acute L MCA branch vessel infarctions; b. 08/2019 CTA head/neck: Patent bilat carotids/vertebrals. High grade stenosis - inf division of prox L MCA M2 branch. additional high-grade mid-dist superior L MCA M2 branch occlusion. 89mm aneurysm arising from cavernous L ICA.  [name] Cardiomyopathy ([place])    a. 08/2019 Echo: EF 20-25%, global HK. Gr2 DD. Nl RV fxn. RVSP 30.60mmHg.  Mildly dil LA. Mod MR.  . Cerebral aneurysm    a. 08/2019 CTA Head: 62mm aneurysm arising from the cavernous L ICA.  [name] Facial mass    a. CT head: 7.0 x 2.3 cm soft tissue mass along lat aspect of L temporal bone, involving L ext ear - highly suscpicious for malignancy.  [name] Heart attack ([place])   . Heart murmur   . Hypertension   . Moderate mitral regurgitation   . Tobacco abuse     Past Surgical History:  Procedure Laterality Date  . BUBBLE STUDY  08/12/2019   Procedure: BUBBLE STUDY;  Surgeon: Elouise Munroe, MD;  Location: [facility];  Service: Cardiology;;  . EXTERNAL EAR SURGERY    . TEE WITHOUT CARDIOVERSION N/A 08/12/2019   Procedure: TRANSESOPHAGEAL ECHOCARDIOGRAM (TEE);  Surgeon: Elouise Munroe, MD;  Location: [facility];  Service: Cardiology;  Laterality:  N/A;    Family History  Problem Relation Age of Onset  . Cancer Mother        died @ 29  . Other Father        died of CO poisoning as a young man.  . Stroke Sister     Social History   Socioeconomic History  . Marital status: Significant Other    Spouse name: Not on file  . Number of children: 6  . Years of education: Not on file  . Highest education level: Not on file  Occupational History  . Not on file  Tobacco Use  . Smoking status: Current Every Day Smoker    Packs/day: 0.50    Years: 35.00    Pack years: 17.50    Types: Cigarettes  . Smokeless tobacco: Never Used  Substance and Sexual Activity  . Alcohol use: No  . Drug use: Yes    Types: Marijuana    Comment: smokes several blunts on the weekend.  . Sexual activity: Yes  Other Topics Concern  . Not on file  Social History Narrative   Lives in [place] w/ fiancee.  Does not routinely exercise but has very busy work-life, working M-F 12 hrs shifts and another 8 hrs on Saturdays.  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   Social Determinants of Health  Financial Resource Strain:   . Difficulty of Paying Living Expenses:   Food Insecurity:   . Worried About Programme researcher, broadcasting/film/video in the Last Year:   . Barista in the Last Year:   Transportation Needs:   . Freight forwarder (Medical):   [name] Lack of Transportation (Non-Medical):   Physical Activity:   . Days of Exercise per Week:   . Minutes of Exercise per Session:   Stress:   . Feeling of Stress :   Social Connections:   . Frequency of Communication with Friends and Family:   . Frequency of Social Gatherings with Friends and Family:   . Attends Religious Services:   . Active Member of Clubs or Organizations:   . Attends Banker Meetings:   [name] Marital Status:   Intimate Partner Violence:   . Fear of Current or Ex-Partner:   . Emotionally Abused:   [name] Physically Abused:   . Sexually Abused:     ROS Review of  Systems  Constitutional: Negative.   HENT: Negative.   Eyes: Negative.   Respiratory: Negative.   Cardiovascular: Negative.   Gastrointestinal: Negative.   Endocrine: Negative.   Genitourinary: Negative.   Musculoskeletal: Negative.   Skin: Negative.   Allergic/Immunologic: Negative.   Neurological: Negative.   Hematological: Negative.   Psychiatric/Behavioral: Negative.   All other systems reviewed and are negative.   Objective:   Today's Vitals: BP 125/82   Pulse 70   Temp 98.1 F (36.7 C) (Temporal)   Ht 5\' 11"  (1.803 m)   Wt 156 lb 3.2 oz (70.9 kg)   SpO2 99%   BMI 21.79 kg/m   Physical Exam Vitals and nursing note reviewed.  Constitutional:      General: He is not in acute distress.    Appearance: Normal appearance. He is normal weight. He is not ill-appearing or toxic-appearing.  Cardiovascular:     Rate and Rhythm: Normal rate and regular rhythm.     Pulses: Normal pulses.     Heart sounds: Normal heart sounds. No murmur. No friction rub. No gallop.   Pulmonary:     Effort: Pulmonary effort is normal.     Breath sounds: Normal breath sounds.  Abdominal:     General: Abdomen is flat. Bowel sounds are normal. There is no distension.     Palpations: Abdomen is soft. There is no mass.     Tenderness: There is no abdominal tenderness. There is no right CVA tenderness, left CVA tenderness, guarding or rebound.     Hernia: No hernia is present.  Musculoskeletal:        General: No swelling, tenderness, deformity or signs of injury. Normal range of motion.     Right lower leg: No edema.     Left lower leg: No edema.  Skin:    General: Skin is warm and dry.     Coloration: Skin is not jaundiced or pale.     Findings: No bruising, erythema, lesion or rash.  Neurological:     General: No focal deficit present.     Mental Status: He is alert and oriented to person, place, and time. Mental status is at baseline.  Psychiatric:        Mood and Affect: Mood normal.         Behavior: Behavior normal.        Thought Content: Thought content normal.        Judgment: Judgment normal.  Assessment & Plan:   Problem List Items Addressed This Visit    None    Visit Diagnoses    Screening for endocrine, metabolic and immunity disorder    -  Primary   Relevant Orders   CBC With Differential   Comprehensive metabolic panel   TSH   Hemoglobin A1c   Lipid screening       Relevant Orders   Lipid panel      Outpatient Encounter Medications as of 08/21/2019  Medication Sig  . apixaban (ELIQUIS) 5 MG TABS tablet Take 1 tablet (5 mg total) by mouth 2 (two) times daily.  [name] atorvastatin (LIPITOR) 80 MG tablet Take 1 tablet (80 mg total) by mouth daily at 6 PM.  . carvedilol (COREG) 3.125 MG tablet Take 1 tablet (3.125 mg total) by mouth 2 (two) times daily with a meal.  . losartan (COZAAR) 25 MG tablet Take 1 tablet (25 mg total) by mouth daily.  [name] acetaminophen (TYLENOL) 325 MG tablet Take 2 tablets (650 mg total) by mouth every 6 (six) hours as needed for mild pain (or temp > 100). (Patient not taking: Reported on 08/09/2019)  . ibuprofen (ADVIL,MOTRIN) 600 MG tablet Take 1 tablet (600 mg total) by mouth every 6 (six) hours as needed. (Patient not taking: Reported on 08/09/2019)   No facility-administered encounter medications on file as of 08/21/2019.    Follow-up: No follow-ups on file.   PLAN  Given his recent hospital course, all things considered, he seems to be in pretty good health today.   He needs to follow up closely with cardiology, neurology, and ENT (with whom he has an appt tomorrow, 08/22/19.  Labs collected, will follow up as warranted  Patient encouraged to call clinic with any questions, comments, or concerns.  Janeece Agee, NP

## 2019-08-22 LAB — LIPID PANEL
Chol/HDL Ratio: 3.2 ratio (ref 0.0–5.0)
Cholesterol, Total: 93 mg/dL — ABNORMAL LOW (ref 100–199)
HDL: 29 mg/dL — ABNORMAL LOW (ref >39)
LDL Chol Calc (NIH): 51 mg/dL (ref 0–99)
Triglycerides: 57 mg/dL (ref 0–149)
VLDL Cholesterol Cal: 13 mg/dL (ref 5–40)

## 2019-08-22 LAB — CBC WITH DIFFERENTIAL
Basophils Absolute: 0.1 10*3/uL (ref 0.0–0.2)
Basos: 1 %
EOS (ABSOLUTE): 0.4 10*3/uL (ref 0.0–0.4)
Eos: 6 %
Hematocrit: 45.9 % (ref 37.5–51.0)
Hemoglobin: 15.9 g/dL (ref 13.0–17.7)
Immature Grans (Abs): 0 10*3/uL (ref 0.0–0.1)
Immature Granulocytes: 0 %
Lymphocytes Absolute: 2.7 10*3/uL (ref 0.7–3.1)
Lymphs: 47 %
MCH: 31.5 pg (ref 26.6–33.0)
MCHC: 34.6 g/dL (ref 31.5–35.7)
MCV: 91 fL (ref 79–97)
Monocytes Absolute: 0.5 10*3/uL (ref 0.1–0.9)
Monocytes: 10 %
Neutrophils Absolute: 2.1 10*3/uL (ref 1.4–7.0)
Neutrophils: 36 %
RBC: 5.04 x10E6/uL (ref 4.14–5.80)
RDW: 11.9 % (ref 11.6–15.4)
WBC: 5.7 10*3/uL (ref 3.4–10.8)

## 2019-08-22 LAB — COMPREHENSIVE METABOLIC PANEL
ALT: 12 IU/L (ref 0–44)
AST: 22 IU/L (ref 0–40)
Albumin/Globulin Ratio: 1.4 (ref 1.2–2.2)
Albumin: 4.2 g/dL (ref 3.8–4.9)
Alkaline Phosphatase: 104 IU/L (ref 39–117)
BUN/Creatinine Ratio: 13 (ref 9–20)
BUN: 14 mg/dL (ref 6–24)
Bilirubin Total: 0.6 mg/dL (ref 0.0–1.2)
CO2: 19 mmol/L — ABNORMAL LOW (ref 20–29)
Calcium: 9.5 mg/dL (ref 8.7–10.2)
Chloride: 103 mmol/L (ref 96–106)
Creatinine, Ser: 1.1 mg/dL (ref 0.76–1.27)
GFR calc Af Amer: 87 mL/min/{1.73_m2} (ref >59)
GFR calc non Af Amer: 75 mL/min/{1.73_m2} (ref >59)
Globulin, Total: 3.1 g/dL (ref 1.5–4.5)
Glucose: 101 mg/dL — ABNORMAL HIGH (ref 65–99)
Potassium: 4.6 mmol/L (ref 3.5–5.2)
Sodium: 136 mmol/L (ref 134–144)
Total Protein: 7.3 g/dL (ref 6.0–8.5)

## 2019-08-22 LAB — HEMOGLOBIN A1C
Est. average glucose Bld gHb Est-mCnc: 108 mg/dL
Hgb A1c MFr Bld: 5.4 % (ref 4.8–5.6)

## 2019-08-22 LAB — TSH: TSH: 1.84 u[IU]/mL (ref 0.450–4.500)

## 2019-08-23 ENCOUNTER — Encounter: Payer: Self-pay | Admitting: Registered Nurse

## 2019-08-23 NOTE — Progress Notes (Signed)
Howdy,  Normal lab results letter to Mr. Myhre would be great.  Have a great weekend,  Jari Sportsman, NP

## 2019-08-26 ENCOUNTER — Other Ambulatory Visit (HOSPITAL_COMMUNITY): Payer: Self-pay | Admitting: Interventional Radiology

## 2019-08-26 ENCOUNTER — Encounter: Payer: Self-pay | Admitting: Radiology

## 2019-08-26 DIAGNOSIS — I671 Cerebral aneurysm, nonruptured: Secondary | ICD-10-CM

## 2019-09-02 ENCOUNTER — Other Ambulatory Visit: Payer: Self-pay

## 2019-09-02 ENCOUNTER — Ambulatory Visit (HOSPITAL_COMMUNITY)
Admission: RE | Admit: 2019-09-02 | Discharge: 2019-09-02 | Disposition: A | Discharge status: Home / Self Care | Payer: Managed Care, Other (non HMO) | Source: Ambulatory Visit | Attending: Interventional Radiology | Admitting: Interventional Radiology

## 2019-09-02 DIAGNOSIS — I671 Cerebral aneurysm, nonruptured: Secondary | ICD-10-CM

## 2019-09-04 ENCOUNTER — Ambulatory Visit (HOSPITAL_COMMUNITY): Payer: Managed Care, Other (non HMO) | Attending: Cardiovascular Disease

## 2019-09-04 ENCOUNTER — Other Ambulatory Visit: Payer: Self-pay

## 2019-09-04 DIAGNOSIS — I429 Cardiomyopathy, unspecified: Secondary | ICD-10-CM | POA: Insufficient documentation

## 2019-09-09 ENCOUNTER — Telehealth: Payer: Self-pay | Admitting: Medical

## 2019-09-09 NOTE — Telephone Encounter (Signed)
Patient returned call for his Echo results.  

## 2019-09-09 NOTE — Telephone Encounter (Signed)
I spoke with patient and reviewed echo results with him.  He gave phone to his wife to schedule appointment.  I scheduled patient to see [name], NP on April 9,2021 at 2:15.  Patient's wife aware this is at [facility] office.

## 2019-09-10 ENCOUNTER — Other Ambulatory Visit: Payer: Self-pay

## 2019-09-10 ENCOUNTER — Ambulatory Visit (HOSPITAL_COMMUNITY)
Admission: RE | Admit: 2019-09-10 | Discharge: 2019-09-10 | Disposition: A | Discharge status: Home / Self Care | Payer: Managed Care, Other (non HMO) | Source: Ambulatory Visit | Attending: Interventional Radiology | Admitting: Interventional Radiology

## 2019-09-10 ENCOUNTER — Telehealth: Payer: Self-pay

## 2019-09-10 NOTE — Consult Note (Signed)
Chief Complaint: Patient was seen in consultation today for left ICA cavernous aneurysm.  Referring Physician(s): Marvel Plan  Supervising Physician: Julieanne Cotton  Patient Status: David [name]  History of Present Illness: David [name] is a 54 y.o. male with a past medical history as below, with pertinent past medical history including hypertension, hyperlipidemia, MI, HF, CVA 08/2019, left temporal mass, and tobacco abuse. He was recently admitted to [facility] 08/09/2019 to 08/14/2019 for management of acute CVA (acute/early subacute ischemic infarction within the mid left frontal lobe and left frontal operculum). During work-up CTA head revealed an incidental finding of a left ICA cavernous aneurysm. He was discharged home 08/14/2019 in stable condition with outpatient NIR consultation regarding management of left ICA cavernous aneurysm.  CTA head 08/09/2019: 1. No intracranial large vessel occlusion. No definite proximal M2 left MCA branch occlusion is identified. 2. There is a high-grade stenosis within an inferior division proximal left MCA M2 branch. Additional high-grade stenosis within the mid to distal superior division left MCA M2 branch. 3. 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  NIR requested by Dr. Roda Shutters for management of left ICA cavernous aneurysm. Patient awake and alert sitting in chair with no complaints at this time. Accompanied by wife. Denies headache, weakness, numbness/tingling, dizziness, vision changes, hearing changes, tinnitus, or speech difficulty.  Currently taking Eliquis 5 mg twice daily.   Past Medical History:  Diagnosis Date  . Acute ischemic left MCA stroke (HCC)    a. 08/2019 MRI: Acute L MCA branch vessel infarctions; b. 08/2019 CTA head/neck: Patent bilat carotids/vertebrals. High grade stenosis - inf division of prox L MCA M2 branch. additional high-grade mid-dist superior L MCA M2 branch occlusion. 99mm aneurysm arising from cavernous L ICA.    [name] Cardiomyopathy (HCC)    a. 08/2019 Echo: EF 20-25%, global HK. Gr2 DD. Nl RV fxn. RVSP 30.68mmHg.  Mildly dil LA. Mod MR.  . Cerebral aneurysm    a. 08/2019 CTA Head: 75mm aneurysm arising from the cavernous L ICA.  [name] Facial mass    a. CT head: 7.0 x 2.3 cm soft tissue mass along lat aspect of L temporal bone, involving L ext ear - highly suscpicious for malignancy.  [name] Heart attack (HCC)   . Heart murmur   . Hypertension   . Moderate mitral regurgitation   . Tobacco abuse     Past Surgical History:  Procedure Laterality Date  . BUBBLE STUDY  08/12/2019   Procedure: BUBBLE STUDY;  Surgeon: Parke Poisson, MD;  Location: [facility] ENDOSCOPY;  Service: Cardiology;;  . EXTERNAL EAR SURGERY    . TEE WITHOUT CARDIOVERSION N/A 08/12/2019   Procedure: TRANSESOPHAGEAL ECHOCARDIOGRAM (TEE);  Surgeon: Parke Poisson, MD;  Location: [facility] ENDOSCOPY;  Service: Cardiology;  Laterality: N/A;    Allergies: Patient has no known allergies.  Medications: Prior to Admission medications   Medication Sig Start Date End Date Taking? Authorizing Provider  acetaminophen (TYLENOL) 325 MG tablet Take 2 tablets (650 mg total) by mouth every 6 (six) hours as needed for mild pain (or temp > 100). Patient not taking: Reported on 08/09/2019 09/13/17   Adam Phenix, PA-C  apixaban (ELIQUIS) 5 MG TABS tablet Take 1 tablet (5 mg total) by mouth 2 (two) times daily. 08/14/19   Marguerita Merles Latif, DO  atorvastatin (LIPITOR) 80 MG tablet Take 1 tablet (80 mg total) by mouth daily at 6 PM. 08/14/19   Marguerita Merles Latif, DO  carvedilol (COREG) 3.125 MG  tablet Take 1 tablet (3.125 mg total) by mouth 2 (two) times daily with a meal. 08/14/19   Sheikh, Omair Latif, DO  ibuprofen (ADVIL,MOTRIN) 600 MG tablet Take 1 tablet (600 mg total) by mouth every 6 (six) hours as needed. Patient not taking: Reported on 08/09/2019 05/13/18   Rodell Perna A, PA-C  losartan (COZAAR) 25 MG tablet Take 1 tablet (25 mg total) by mouth daily. 08/15/19    Kerney Elbe, DO     Family History  Problem Relation Age of Onset  . Cancer Mother        died @ 97  . Other Father        died of CO poisoning as a young man.  . Stroke Sister     Social History   Socioeconomic History  . Marital status: Significant Other    Spouse name: Not on file  . Number of children: 6  . Years of education: Not on file  . Highest education level: Not on file  Occupational History  . Not on file  Tobacco Use  . Smoking status: Current Every Day Smoker    Packs/day: 0.50    Years: 35.00    Pack years: 17.50    Types: Cigarettes  . Smokeless tobacco: Never Used  Substance and Sexual Activity  . Alcohol use: No  . Drug use: Yes    Types: Marijuana    Comment: smokes several blunts on the weekend.  . Sexual activity: Yes  Other Topics Concern  . Not on file  Social History Narrative   Lives in [place] w/ fiancee.  Does not routinely exercise but has very busy work-life, working M-F 12 hrs shifts and another 8 hrs on Saturdays.  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   Social Determinants of Health   Financial Resource Strain:   . Difficulty of Paying Living Expenses:   Food Insecurity:   . Worried About Charity fundraiser in the Last Year:   . Arboriculturist in the Last Year:   Transportation Needs:   . Film/video editor (Medical):   [name] Lack of Transportation (Non-Medical):   Physical Activity:   . Days of Exercise per Week:   . Minutes of Exercise per Session:   Stress:   . Feeling of Stress :   Social Connections:   . Frequency of Communication with Friends and Family:   . Frequency of Social Gatherings with Friends and Family:   . Attends Religious Services:   . Active Member of Clubs or Organizations:   . Attends Archivist Meetings:   [name] Marital Status:      Review of Systems: A 12 point ROS discussed and pertinent positives are indicated in the HPI above.  All other systems are  negative.  Review of Systems  Constitutional: Negative for chills and fever.  HENT: Negative for hearing loss and tinnitus.   Eyes: Negative for visual disturbance.  Respiratory: Negative for shortness of breath and wheezing.   Cardiovascular: Negative for chest pain and palpitations.  Neurological: Negative for dizziness, speech difficulty, weakness, numbness and headaches.  Psychiatric/Behavioral: Negative for behavioral problems and confusion.    Vital Signs: There were no vitals taken for this visit.  Physical Exam Constitutional:      General: He is not in acute distress.    Appearance: Normal appearance.  Pulmonary:     Effort: Pulmonary effort is normal. No respiratory distress.  Skin:  General: Skin is warm and dry.  Neurological:     Mental Status: He is alert and oriented to person, place, and time.      Imaging: CT CORONARY MORPH W/CTA COR W/SCORE W/CA W/CM &/OR WO/CM  Addendum Date: 08/14/2019   ADDENDUM REPORT: 08/14/2019 13:07 CLINICAL DATA:  55 year old male with new cardiomyopathy and a stroke. EXAM: Cardiac/Coronary  CTA TECHNIQUE: The patient was scanned on a [employer]. FINDINGS: A 100 kV prospective scan was triggered in the descending thoracic aorta at 111 HU's. Axial non-contrast 3 mm slices were carried out through the heart. The data set was analyzed on a dedicated work station and scored using the Agatson method. Gantry rotation speed was 250 msecs and collimation was .6 mm. 10 mg of PO Ivabradine and 0.8 mg of sl NTG was given. The 3D data set was reconstructed in 5% intervals of the 67-82 % of the R-R cycle. Diastolic phases were analyzed on a dedicated work station using MPR, MIP and VRT modes. The patient received 80 cc of contrast. Aorta:  Normal size.  No calcifications.  No dissection. Aortic Valve:  Trileaflet.  No calcifications. Coronary Arteries:  Normal coronary origin.  Right dominance. RCA is a large dominant artery that gives rise  to PDA and PLA. There is no plaque. Left main is a large artery that gives rise to LAD and LCX arteries. Left main has no plaque. LAD is a large vessel that has no plaque. LCX is a non-dominant artery that gives rise to two OM branches. There is no plaque. Other findings: Normal pulmonary vein drainage into the left atrium. Normal left atrial appendage without a thrombus. Normal size of the pulmonary artery. IMPRESSION: 1. Coronary calcium score of 0. This was 0 percentile for age and sex matched control. 2. Normal coronary origin with right dominance. 3. CAD-RADS 0. No evidence of CAD (0%). Consider non-atherosclerotic causes of chest pain. Electronically Signed   By: Tobias Alexander   On: 08/14/2019 13:07   Result Date: 08/14/2019 EXAM: OVER-READ INTERPRETATION  CT CHEST The following report is an over-read performed by radiologist Dr. Charlett Nose of [facility] on 08/14/2019. This over-read does not include interpretation of cardiac or coronary anatomy or pathology. The coronary CTA interpretation by the cardiologist is attached. COMPARISON:  None. FINDINGS: Vascular: Heart is normal size.  Visualized aorta normal caliber Mediastinum/Nodes: No adenopathy in the lower mediastinum or hila. Lungs/Pleura: Linear scarring in the lung bases. No confluent opacities or effusions. Upper Abdomen: Imaging into the upper abdomen shows no acute findings. Musculoskeletal: Chest wall soft tissues are unremarkable. Old posterior right rib fracture. No acute bony abnormality. IMPRESSION: No acute or significant extracardiac abnormality. Electronically Signed: By: Charlett Nose M.D. On: 08/14/2019 09:41   MR CARDIAC MORPHOLOGY W WO CONTRAST  Result Date: 08/13/2019 CLINICAL DATA:  54 year old male with newly diagnosed cardiomyopathy of unknown etiology, acute combined systolic and diastolic CHF, nsVT and stroke. EXAM: CARDIAC MRI TECHNIQUE: The patient was scanned on a 1.5 Tesla GE magnet. A dedicated cardiac coil  was used. Functional imaging was done using [employer] sequences. 2,3, and 4 chamber views were done to assess for RWMA's. Modified Simpson's rule using a short axis stack was used to calculate an ejection fraction on a dedicated work Research officer, trade [employer]. The patient received 11 cc of Gadavist. After 10 minutes inversion recovery sequences were used to assess for infiltration and scar tissue. CONTRAST:  11 cc  of Gadavist FINDINGS: 1. Severely  dilated left ventricle with mild concentric hypertrophy and severely decreased systolic function (LVEF = 20%). There is severe diffuse hypokinesis with akinesis in the basal, mid and apical inferior wall. There is transmural late gadolinium enhancement in the basal, mid and apical inferior walls. LVEDD: 69 mm LVESD: 64 mm LVEDV: 260 ml LVESV: 208 ml SV: 53 ml CO: 3.7 L/min Myocardial mass: 230 g 2. Normal right ventricular size, thickness and mildly decreased systolic function (LVEF = 39%). There are no regional wall motion abnormalities. 3. Normal left and right atrial size. 4. Normal size of the aortic root, ascending aorta and pulmonary artery. 5.  Mild mitral and tricuspid regurgitation. 6. Normal pericardium.  No pericardial effusion. IMPRESSION: 1. Severely dilated left ventricle with mild concentric hypertrophy and severely decreased systolic function (LVEF = 20%). There is severe diffuse hypokinesis with akinesis in the basal, mid and apical inferior wall. There is transmural late gadolinium enhancement in the basal, mid and apical inferior walls. There is a heavy smoke/sludge in the left ventricular apex consistent with pre-thrombotic state. Systemic anticoagulation is recommended. 2. Normal right ventricular size, thickness and mildly decreased systolic function (LVEF = 39%). There are no regional wall motion abnormalities. 3. Normal left and right atrial size. 4. Normal size of the aortic root, ascending aorta and pulmonary artery. 5. Mild mitral and tricuspid  regurgitation. 6. Normal pericardium.  No pericardial effusion. These findings are suspicious for an ischemic cardiomyopathy, an ischemic workup is recommended. Electronically Signed   By: Tobias Alexander   On: 08/13/2019 20:59   ECHOCARDIOGRAM COMPLETE  Result Date: 09/04/2019    ECHOCARDIOGRAM REPORT   Patient Name:   JOVONNI BORQUEZ [facility] Date of Exam: 09/04/2019 Medical Rec #:  [id number]       Height:       71.0 in Accession #:    [id number]      Weight:       156.2 lb Date of Birth:  1966/05/31       BSA:          1.898 m Patient Age:    53 years        BP:           125/82 mmHg Patient Gender: M               HR:           88 bpm. Exam Location:  [address] Procedure: 2D Echo, Cardiac Doppler and Color Doppler Indications:    I42.9  History:        Patient has prior history of Echocardiogram examinations, most                 recent 08/10/2019. Cardiomyopathy, Stroke, Signs/Symptoms:Murmur;                 Risk Factors:Current Smoker and Hypertension.  Sonographer:    Samule Ohm RDCS Referring Phys: [id number] CADENCE H FURTH IMPRESSIONS  1. No LV thombus is seen. Left ventricular ejection fraction, by estimation, is 20 to 25%. The left ventricle has severely decreased function. The left ventricle demonstrates global hypokinesis. The left ventricular internal cavity size was severely dilated. Left ventricular diastolic parameters are consistent with Grade II diastolic dysfunction (pseudonormalization). Elevated left atrial pressure.  2. Right ventricular systolic function is normal. The right ventricular size is normal. There is normal pulmonary artery systolic pressure. The estimated right ventricular systolic pressure is 25.3 mmHg.  3. Left atrial size was mildly dilated.  4. The mitral  valve is normal in structure. Mild to moderate mitral valve regurgitation.  5. The aortic valve is normal in structure. Aortic valve regurgitation is not visualized.  6. The inferior vena cava is normal in size with greater  than 50% respiratory variability, suggesting right atrial pressure of 3 mmHg. Comparison(s): Prior images reviewed side by side. Changes from prior study are noted. Although LV systolic function is unchanged, there is less mitral regurgitation and the signs of high left atrial pressure are improved. FINDINGS  Left Ventricle: No LV thombus is seen. Left ventricular ejection fraction, by estimation, is 20 to 25%. The left ventricle has severely decreased function. The left ventricle demonstrates global hypokinesis. The left ventricular internal cavity size was  severely dilated. There is no left ventricular hypertrophy. Left ventricular diastolic parameters are consistent with Grade II diastolic dysfunction (pseudonormalization). Elevated left atrial pressure. Right Ventricle: The right ventricular size is normal. No increase in right ventricular wall thickness. Right ventricular systolic function is normal. There is normal pulmonary artery systolic pressure. The tricuspid regurgitant velocity is 2.36 m/s, and  with an assumed right atrial pressure of 3 mmHg, the estimated right ventricular systolic pressure is 25.3 mmHg. Left Atrium: Left atrial size was mildly dilated. Right Atrium: Right atrial size was normal in size. Pericardium: There is no evidence of pericardial effusion. Mitral Valve: The mitral valve is normal in structure. Mild to moderate mitral valve regurgitation, with centrally-directed jet. Tricuspid Valve: The tricuspid valve is normal in structure. Tricuspid valve regurgitation is trivial. Aortic Valve: The aortic valve is normal in structure. Aortic valve regurgitation is not visualized. Pulmonic Valve: The pulmonic valve was not well visualized. Pulmonic valve regurgitation is not visualized. Aorta: The aortic root is normal in size and structure. Venous: The inferior vena cava is normal in size with greater than 50% respiratory variability, suggesting right atrial pressure of 3 mmHg. IAS/Shunts:  No atrial level shunt detected by color flow Doppler.  LEFT VENTRICLE PLAX 2D LVIDd:         7.10 cm  Diastology LVIDs:         6.20 cm  LV e' lateral:   4.35 cm/s LV PW:         1.10 cm  LV E/e' lateral: 18.9 LV IVS:        1.20 cm  LV e' medial:    5.11 cm/s LVOT diam:     2.10 cm  LV E/e' medial:  16.1 LV SV:         45 LV SV Index:   24 LVOT Area:     3.46 cm  RIGHT VENTRICLE             IVC RV S prime:     13.40 cm/s  IVC diam: 1.40 cm TAPSE (M-mode): 3.4 cm RVSP:           25.3 mmHg LEFT ATRIUM             Index       RIGHT ATRIUM           Index LA diam:        3.90 cm 2.05 cm/m  RA Pressure: 3.00 mmHg LA Vol (A2C):   53.7 ml 28.29 ml/m RA Area:     10.30 cm LA Vol (A4C):   61.0 ml 32.13 ml/m RA Volume:   21.20 ml  11.17 ml/m LA Biplane Vol: 58.9 ml 31.03 ml/m  AORTIC VALVE LVOT Vmax:   79.00 cm/s LVOT Vmean:  53.000  cm/s LVOT VTI:    0.130 m  AORTA Ao Root diam: 3.50 cm Ao Asc diam:  3.20 cm MV E velocity: 82.30 cm/s  TRICUSPID VALVE MV A velocity: 96.40 cm/s  TR Peak grad:   22.3 mmHg MV E/A ratio:  0.85        TR Vmax:        236.00 cm/s                            Estimated RAP:  3.00 mmHg                            RVSP:           25.3 mmHg                             SHUNTS                            Systemic VTI:  0.13 m                            Systemic Diam: 2.10 cm Thurmon Fair MD Electronically signed by Thurmon Fair MD Signature Date/Time: 09/04/2019/5:33:03 PM    Final    ECHO TEE  Result Date: 08/12/2019    TRANSESOPHOGEAL ECHO REPORT   Patient Name:   RHETT MUTSCHLER [facility] Date of Exam: 08/12/2019 Medical Rec #:  [id number]       Height:       71.0 in Accession #:    [id number]      Weight:       165.0 lb Date of Birth:  28-Aug-1965       BSA:          1.943 m Patient Age:    53 years        BP:           122/80 mmHg Patient Gender: M               HR:           94 bpm. Exam Location:  Inpatient Procedure: Transesophageal Echo, Limited Color Doppler and Cardiac Doppler Indications:      Stroke 434.91 / I63.9  History:         Patient has prior history of Echocardiogram examinations, most                  recent 08/10/2019. Stroke; Risk Factors:Current Smoker.  Sonographer:     Leta Jungling RDCS Referring Phys:  [id number] Dois Davenport BERGE Diagnosing Phys: Weston Brass MD PROCEDURE: After discussion of the risks and benefits of a TEE, an informed consent was obtained from the patient. TEE procedure time was 18 minutes. The transesophogeal probe was passed without difficulty through the esophogus of the patient. Imaged were obtained with the patient in a left lateral decubitus position. Local oropharyngeal anesthetic was provided with Cetacaine. Sedation performed by different physician. The patient was monitored while under deep sedation. Image quality was good. The patient's vital signs; including heart rate, blood pressure, and oxygen saturation; remained stable throughout the procedure. The patient developed no complications during the procedure. IMPRESSIONS  1. Left ventricular ejection fraction, by estimation, is 20 to 25%. The left ventricle has severely decreased function. The left  ventricle demonstrates global hypokinesis. The left ventricular internal cavity size was moderately dilated.  2. Right ventricular systolic function is normal. The right ventricular size is normal.  3. Left atrial size was mildly dilated. No left atrial/left atrial appendage thrombus was detected. The LAA emptying velocity was 105 cm/s.  4. The mitral valve is grossly normal. Mild mitral valve regurgitation.  5. The aortic valve is tricuspid. Aortic valve regurgitation is trivial.  6. There is borderline dilatation of the ascending aorta measuring 38 mm. There is mild (Grade II) atheroma plaque involving the transverse and descending aorta.  7. Evidence of atrial level shunting detected by color flow Doppler. Agitated saline contrast bubble study was positive with shunting observed within 3-6 cardiac cycles  suggestive of interatrial shunt. There is a tiny patent foramen ovale. FINDINGS  Left Ventricle: Left ventricular ejection fraction, by estimation, is 20 to 25%. The left ventricle has severely decreased function. The left ventricle demonstrates global hypokinesis. The left ventricular internal cavity size was moderately dilated. There is no left ventricular hypertrophy. Right Ventricle: The right ventricular size is normal. No increase in right ventricular wall thickness. Right ventricular systolic function is normal. Left Atrium: Left atrial size was mildly dilated. No left atrial/left atrial appendage thrombus was detected. The LAA emptying velocity was 105 cm/s. Right Atrium: Right atrial size was normal in size. Pericardium: Trivial pericardial effusion is present. Mitral Valve: The mitral valve is grossly normal. Mild mitral valve regurgitation. Tricuspid Valve: The tricuspid valve is normal in structure. Tricuspid valve regurgitation is trivial. Aortic Valve: The aortic valve is tricuspid. Aortic valve regurgitation is trivial. Pulmonic Valve: The pulmonic valve was normal in structure. Pulmonic valve regurgitation is not visualized. Aorta: The aortic root is normal in size and structure. There is borderline dilatation of the ascending aorta measuring 38 mm. There is mild (Grade II) atheroma plaque involving the transverse and descending aorta. IAS/Shunts: There is redundancy of the interatrial septum. Evidence of atrial level shunting detected by color flow Doppler. Agitated saline contrast was given intravenously to evaluate for intracardiac shunting. Agitated saline contrast bubble study was  positive with shunting observed within 3-6 cardiac cycles suggestive of interatrial shunt. A tiny patent foramen ovale is detected.   AORTA Ao Sinus diam: 3.30 cm Ao Asc diam:   3.80 cm Weston Brass MD Electronically signed by Weston Brass MD Signature Date/Time: 08/12/2019/9:26:01 PM    Final      Labs:  CBC: Recent Labs    08/09/19 1737 08/09/19 1738 08/11/19 0243 08/12/19 0435 08/14/19 0948 08/21/19 0908  WBC 7.1   < > 5.6 6.9 6.9 5.7  HGB 14.6   < > 15.3 15.8 15.4 15.9  HCT 42.7   < > 45.5 46.6 45.2 45.9  PLT 213  --  210 227 219  --    < > = values in this interval not displayed.    COAGS: Recent Labs    08/09/19 1812  INR 1.1  APTT 24    BMP: Recent Labs    08/11/19 0243 08/12/19 0435 08/14/19 0948 08/21/19 0908  NA 138 140 135 136  K 3.6 4.0 4.1 4.6  CL 105 105 103 103  CO2 19*  GLUCOSE 107* 101* 95 101*  BUN CALCIUM 8.9 9.1 8.7* 9.5  CREATININE 1.17 1.20 1.20 1.10  GFRNONAA >60 >60 >60 75  GFRAA >60 >60 >60 87    LIVER FUNCTION TESTS: Recent Labs    08/09/19  1737 08/14/19 0948 08/21/19 0908  BILITOT 0.9 0.7 0.6  AST ALT ALKPHOS 84 80 104  PROT 7.3 6.9 7.3  ALBUMIN 4.0 3.4* 4.2     Assessment and Plan:  Left ICA cavernous aneurysm, seen on CTA head 08/09/2019 during CVA work-up. Dr. Corliss Skains was present for consultation.  Discussed patient's post-stroke course. Patient states that he has been doing well post-stroke and does not require assistance with ADLs at home. Recommend patient follow-up with neurology regularly regarding secondary stroke prevention. Counseled patient on tobacco cessation. Patient's wife requesting medication refills- recommend patient follow-up with PCP regarding medication refills and overall general health maintenance.  Discussed left temporal mass. Patient states that he had a biopsy a few weeks ago by Dr. Jearld Fenton. Chart check- left temporal mass FNA 3/18, results show ?possible less-than-optimal for evaluation. Recommend patient follow-up with ENT regarding biopsy results.   Discussed Eliquis use. Patient was started on Eliquis during recent admission due to HF. Instructed patient to continue taking Eliquis 5 mg twice daily as directed by his cardiologist.  Instructed patient to follow-up with his cardiologist regularly.  Discussed findings of recent CTA head during admission- specifically left ICA cavernous aneurysm. Explained nature of aneurysms. Explained this aneurysm is extracranial. Explained the best course of management for his aneurysm at this time is with an image-guided diagnostic cerebral arteriogram. Explained procedure, including risks and benefits. Explained the indication for this procedure is to accurately evaluate his aneurysm and plan possible treatment. Patient expresses desire to move forward with procedure. However, given history (left temporal mass), recommend awaiting FNA results prior to scheduling. Plan for follow-up with an image-guided diagnostic cerebral arteriogram pending results of temporal mass FNA. Instructed patient to call us once he has results of FNA. Informed patient that our schedulers will call him in approximately 1 month if we have not heard from him.  All questions answered and concerns addressed. Patient and wife convey understanding and agree with plan.  Thank you for this interesting consult.  I greatly enjoyed meeting Lochlin J Cegielski and look forward to participating in their care.  A copy of this report was sent to the requesting provider on this date.  Electronically Signed: Elwin Mocha, PA-C 09/10/2019, 9:43 AM   I spent a total of 40 Minutes in face to face in clinical consultation, greater than 50% of which was counseling/coordinating care for left ICA cavernous aneurysm.

## 2019-09-10 NOTE — Telephone Encounter (Signed)
Pt is requesting refills on medications Losartan Last filled 08/15/2019 Lipator last filled 08/14/2019 Eloquis last filled 08/14/2019 Carvedilol last filled 08/14/2019  Also requesting a nicotine patch.  Does he need to make an appointment to discuss this and to get these Rx?

## 2019-09-10 NOTE — Telephone Encounter (Signed)
Pt's fiance called requesting refills on losartan, Lipitor, Eloquis, and carvedilol. Pt.'s fiance also requested nicotine patches on behalf of the pt. To be sent to usual pharmacy

## 2019-09-12 ENCOUNTER — Other Ambulatory Visit: Payer: Self-pay

## 2019-09-12 MED ORDER — APIXABAN 5 MG PO TABS: 5.0000 mg | ORAL_TABLET | Freq: Two times a day (BID) | ORAL | 0 refills | Status: DC

## 2019-09-12 MED ORDER — ATORVASTATIN CALCIUM 80 MG PO TABS: 80.0000 mg | ORAL_TABLET | Freq: Every day | ORAL | 0 refills | Status: DC

## 2019-09-12 MED ORDER — LOSARTAN POTASSIUM 25 MG PO TABS: 25.0000 mg | ORAL_TABLET | Freq: Every day | ORAL | 0 refills | Status: DC

## 2019-09-12 MED ORDER — CARVEDILOL 3.125 MG PO TABS: 3.1250 mg | ORAL_TABLET | Freq: Two times a day (BID) | ORAL | 0 refills | Status: DC

## 2019-09-12 MED ORDER — NICOTINE 21 MG/24HR TD PT24: 21.0000 mg | MEDICATED_PATCH | Freq: Every day | TRANSDERMAL | 1 refills | Status: DC

## 2019-09-12 NOTE — Telephone Encounter (Signed)
CALLED PT'S MOTHER(EC-OK per verbal per pt) she states pt still has life vest and is not able to apply monitor. D/w Verdon Cummins he states that pt needs to be rescheduled and see EP first before our appt, to discuss vest/monitor. Pt also needs refills and nicotine patch. Ok for 30 days of cardiac medications but patch needs to be filled by PCP. Mother verbalizes understanding, she will call in an hour for refills. Refills sent. Sent patch refill to Janeece Agee, NP.

## 2019-09-12 NOTE — Telephone Encounter (Signed)
Patient is out of all meds as of today except the Eloquis  Please reach out to patient  Also fiance is asking for nicotin patches as discussed

## 2019-09-13 ENCOUNTER — Ambulatory Visit: Payer: Managed Care, Other (non HMO) | Admitting: General Practice

## 2019-09-17 NOTE — Telephone Encounter (Signed)
No we can send these no problem.  Thank you!  Jari Sportsman, NP

## 2019-09-17 NOTE — Progress Notes (Signed)
PCP:  Maximiano Coss, NP Primary Cardiologist: Kirk Ruths, MD Electrophysiologist: Dr. York Grice is a 54 y.o. male with past medical history of cryptogenic stroke and newly diagnosed systolic CHF who presents today for routine electrophysiology followup. They are seen for Dr. Lovena Le.   Since last being seen in our clinic, the patient reports doing well, though anxious about his new diagnoses. He has a lifevest in place, and thus has not/can not wear the event monitor.  He was also started on Eliquis for EF <30% in the setting of stroke. He has not yet had a visit for medication titration.  He confirms that he has NOT missed any doses of his medication, and was able to get them refilled prior to running out (re: phone notes from last week)  The patient feels that he is tolerating medications without difficulties and is otherwise without complaint today.   Past Medical History:  Diagnosis Date  . Acute ischemic left MCA stroke ([facility])    a. 08/2019 MRI: Acute L MCA branch vessel infarctions; b. 08/2019 CTA head/neck: Patent bilat carotids/vertebrals. High grade stenosis - inf division of prox L MCA M2 branch. additional high-grade mid-dist superior L MCA M2 branch occlusion. 54mm aneurysm arising from cavernous L ICA.  [name] Cardiomyopathy ([facility])    a. 08/2019 Echo: EF 20-25%, global HK. Gr2 DD. Nl RV fxn. RVSP 30.89mmHg.  Mildly dil LA. Mod MR.  . Cerebral aneurysm    a. 08/2019 CTA Head: 17mm aneurysm arising from the cavernous L ICA.  [name] Facial mass    a. CT head: 7.0 x 2.3 cm soft tissue mass along lat aspect of L temporal bone, involving L ext ear - highly suscpicious for malignancy.  [name] Heart attack ([facility])   . Heart murmur   . Hypertension   . Moderate mitral regurgitation   . Tobacco abuse    Past Surgical History:  Procedure Laterality Date  . BUBBLE STUDY  08/12/2019   Procedure: BUBBLE STUDY;  Surgeon: Elouise Munroe, MD;  Location: [facility];  Service: Cardiology;;  .  EXTERNAL EAR SURGERY    . TEE WITHOUT CARDIOVERSION N/A 08/12/2019   Procedure: TRANSESOPHAGEAL ECHOCARDIOGRAM (TEE);  Surgeon: Elouise Munroe, MD;  Location: [facility];  Service: Cardiology;  Laterality: N/A;    Current Outpatient Medications  Medication Sig Dispense Refill  . apixaban (ELIQUIS) 5 MG TABS tablet Take 1 tablet (5 mg total) by mouth 2 (two) times daily. 60 tablet 0  . atorvastatin (LIPITOR) 80 MG tablet Take 1 tablet (80 mg total) by mouth daily at 6 PM. 30 tablet 0  . carvedilol (COREG) 3.125 MG tablet Take 1 tablet (3.125 mg total) by mouth 2 (two) times daily with a meal. 60 tablet 0  . ibuprofen (ADVIL,MOTRIN) 600 MG tablet Take 1 tablet (600 mg total) by mouth every 6 (six) hours as needed. 30 tablet 0  . losartan (COZAAR) 25 MG tablet Take 1 tablet (25 mg total) by mouth daily. 30 tablet 0  . nicotine (NICODERM CQ) 21 mg/24hr patch Place 1 patch (21 mg total) onto the skin daily. 28 patch 1   No current facility-administered medications for this visit.    No Known Allergies  Social History   Socioeconomic History  . Marital status: Significant Other    Spouse name: Not on file  . Number of children: 6  . Years of education: Not on file  . Highest education level: Not on file  Occupational History  .  Not on file  Tobacco Use  . Smoking status: Current Every Day Smoker    Packs/day: 0.50    Years: 35.00    Pack years: 17.50    Types: Cigarettes  . Smokeless tobacco: Never Used  Substance and Sexual Activity  . Alcohol use: No  . Drug use: Yes    Types: Marijuana    Comment: smokes several blunts on the weekend.  . Sexual activity: Yes  Other Topics Concern  . Not on file  Social History Narrative   Lives in [place] w/ fiancee.  Does not routinely exercise but has very busy work-life, working M-F 12 hrs shifts and another 8 hrs on Saturdays.  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   Social Determinants of Health   Financial  Resource Strain:   . Difficulty of Paying Living Expenses:   Food Insecurity:   . Worried About Programme researcher, broadcasting/film/video in the Last Year:   . Barista in the Last Year:   Transportation Needs:   . Freight forwarder (Medical):   [name] Lack of Transportation (Non-Medical):   Physical Activity:   . Days of Exercise per Week:   . Minutes of Exercise per Session:   Stress:   . Feeling of Stress :   Social Connections:   . Frequency of Communication with Friends and Family:   . Frequency of Social Gatherings with Friends and Family:   . Attends Religious Services:   . Active Member of Clubs or Organizations:   . Attends Banker Meetings:   [name] Marital Status:   Intimate Partner Violence:   . Fear of Current or Ex-Partner:   . Emotionally Abused:   [name] Physically Abused:   . Sexually Abused:      Review of Systems: General: No chills, fever, night sweats or weight changes  Cardiovascular:  No chest pain, dyspnea on exertion, edema, orthopnea, palpitations, paroxysmal nocturnal dyspnea Dermatological: No rash, lesions or masses Respiratory: No cough, dyspnea Urologic: No hematuria, dysuria Abdominal: No nausea, vomiting, diarrhea, bright red blood per rectum, melena, or hematemesis Neurologic: No visual changes, weakness, changes in mental status All other systems reviewed and are otherwise negative except as noted above.  Physical Exam: Vitals:   09/18/19 1000  BP: 120/78  Pulse: 76  SpO2: 99%  Weight: 169 lb 12.8 oz (77 kg)  Height: 5\' 11"  (1.803 m)    GEN- The patient is well appearing, alert and oriented x 3 today.   HEENT: normocephalic, atraumatic; sclera clear, conjunctiva pink; hearing intact; oropharynx clear; neck supple, no JVP Lymph- no cervical lymphadenopathy Lungs- Clear to ausculation bilaterally, normal work of breathing.  No wheezes, rales, rhonchi Heart- Regular rate and rhythm, no murmurs, rubs or gallops, PMI not laterally displaced.  Lifevest in place. GI- soft, non-tender, non-distended, bowel sounds present, no hepatosplenomegaly Extremities- no clubbing, cyanosis, or edema; DP/PT/radial pulses 2+ bilaterally MS- no significant deformity or atrophy Skin- warm and dry, no rash or lesion Psych- euthymic mood, full affect Neuro- strength and sensation are intact  EKG is not ordered.   Assessment and Plan:  1. Cryptogenic Stroke ILR deferred in setting of new systolic CHF He is currently on Eliquis with EF 20-25%, with neuro recommendation to change to ASA once EF > 30-35% if not otherwise indicated by then. Lifevest in place so unable to wear event monitor. Also currently covered with Eliquis.  Monitoring will need to be addressed in the future.  If his EF recovers, would consider ILR; If his EF remains down, will need ischemic work up and ICD consideration.     2. Chronic systolic CHF New diagnosis incidental finding in setting of CVA work up.  Volume status stable on exam NYHA II-III symptoms. He had solitary episode of chest pain week prior to his CVA. Troponin negative that admission.  He is currently wearing a lifevest. Continue for now with NSVT.  Increase coreg to 6.25 mg BID.  Continue Losartan 25 mg daily, current guidelines recommend transition to [facility] as tolerated.  Consider spironolactone based on labs. His K was borderline high so will assess this prior to starting.  He will need follow up with gen cards or clinical pharmacist in several weeks for continued titration of GDMT.  Have communicated with Oris Drone, NP, who will arrange gen cards follow up for continued GDMT. Will need Echo in late June after med titration. Would plan follow up with Dr. Ladona Ridgel post echo to discuss ICD vs ILR.   Graciella Freer, PA-C  09/18/19 10:27 AM

## 2019-09-18 ENCOUNTER — Encounter: Payer: Self-pay | Admitting: Student

## 2019-09-18 ENCOUNTER — Other Ambulatory Visit: Payer: Self-pay

## 2019-09-18 ENCOUNTER — Ambulatory Visit (INDEPENDENT_AMBULATORY_CARE_PROVIDER_SITE_OTHER): Payer: Managed Care, Other (non HMO) | Admitting: Student

## 2019-09-18 VITALS — BP 120/78 | HR 76 | Ht 71.0 in | Wt 169.8 lb

## 2019-09-18 DIAGNOSIS — I5022 Chronic systolic (congestive) heart failure: Secondary | ICD-10-CM | POA: Diagnosis not present

## 2019-09-18 DIAGNOSIS — I639 Cerebral infarction, unspecified: Secondary | ICD-10-CM

## 2019-09-18 LAB — BASIC METABOLIC PANEL
BUN/Creatinine Ratio: 10 (ref 9–20)
BUN: 12 mg/dL (ref 6–24)
CO2: 27 mmol/L (ref 20–29)
Calcium: 9.4 mg/dL (ref 8.7–10.2)
Chloride: 102 mmol/L (ref 96–106)
Creatinine, Ser: 1.16 mg/dL (ref 0.76–1.27)
GFR calc Af Amer: 81 mL/min/{1.73_m2} (ref >59)
GFR calc non Af Amer: 70 mL/min/{1.73_m2} (ref >59)
Glucose: 84 mg/dL (ref 65–99)
Potassium: 4.3 mmol/L (ref 3.5–5.2)
Sodium: 141 mmol/L (ref 134–144)

## 2019-09-18 MED ORDER — CARVEDILOL 6.25 MG PO TABS: 6.2500 mg | ORAL_TABLET | Freq: Two times a day (BID) | ORAL | 3 refills | Status: DC

## 2019-09-18 NOTE — Patient Instructions (Signed)
Medication Instructions:  INCREASE Carvedilol to 6.25 mg TWICE DAILY *If you need a refill on your cardiac medications before your next appointment, please call your pharmacy*   Lab Work: TODAY BMET If you have labs (blood work) drawn today and your tests are completely normal, you will receive your results only by: [name] MyChart Message (if you have MyChart) OR . A paper copy in the mail If you have any lab test that is abnormal or we need to change your treatment, we will call you to review the results.   Testing/Procedures: NONE   Follow-Up: At [facility], you and your health needs are our priority.  As part of our continuing mission to provide you with exceptional heart care, we have created designated Provider Care Teams.  These Care Teams include your primary Cardiologist (physician) and Advanced Practice Providers (APPs -  Physician Assistants and Nurse Practitioners) who all work together to provide you with the care you need, when you need it.  We recommend signing up for the patient portal called "MyChart".  Sign up information is provided on this After Visit Summary.  MyChart is used to connect with patients for Virtual Visits (Telemedicine).  Patients are able to view lab/test results, encounter notes, upcoming appointments, etc.  Non-urgent messages can be sent to your provider as well.   To learn more about what you can do with MyChart, go to [email or web address].    Your next appointment:   4 month(s)  The format for your next appointment:   Either In Person or Virtual  Provider:   Dr Ladona Ridgel   Other Instructions

## 2019-09-24 ENCOUNTER — Telehealth: Payer: Self-pay

## 2019-09-24 NOTE — Telephone Encounter (Signed)
Received documentation from [employer] stating that Pt has not complied with requests to initiate home hook up study. Order has been Cancelled.  Pt has not returned monitor back to [employer] yet.

## 2019-10-04 ENCOUNTER — Other Ambulatory Visit: Payer: Self-pay | Admitting: General Practice

## 2019-10-07 ENCOUNTER — Telehealth (HOSPITAL_COMMUNITY): Payer: Self-pay

## 2019-10-07 NOTE — Telephone Encounter (Signed)
Called pt to find out the results of his temporal mass bx. He says that the doctor never did the biopsy. He went to have it done but the doctor stated that "it was fine and not needed". I asked if he wanted to go ahead and schedule his angiogram. He goes to see his cardiologist on 10/09/19. He will call back after he sees the cardiologist. He will also ask them about holding Eliquis prior to his angiogram. AW

## 2019-10-09 ENCOUNTER — Ambulatory Visit: Payer: Managed Care, Other (non HMO) | Admitting: Neurology

## 2019-10-09 ENCOUNTER — Encounter: Payer: Self-pay | Admitting: Neurology

## 2019-10-09 ENCOUNTER — Other Ambulatory Visit: Payer: Self-pay

## 2019-10-09 VITALS — BP 123/73 | HR 76 | Temp 97.6°F | Ht 71.0 in | Wt 162.4 lb

## 2019-10-09 DIAGNOSIS — R29810 Facial weakness: Secondary | ICD-10-CM | POA: Diagnosis not present

## 2019-10-09 DIAGNOSIS — I639 Cerebral infarction, unspecified: Secondary | ICD-10-CM | POA: Diagnosis not present

## 2019-10-09 DIAGNOSIS — I671 Cerebral aneurysm, nonruptured: Secondary | ICD-10-CM | POA: Diagnosis not present

## 2019-10-09 DIAGNOSIS — I422 Other hypertrophic cardiomyopathy: Secondary | ICD-10-CM | POA: Diagnosis not present

## 2019-10-09 NOTE — Progress Notes (Signed)
[facility] [address] [place]. [place] [address] [phone]       OFFICE CONSULT NOTE  [name] Date of Birth:  29-Feb-1966 Medical Record Number:  [id number]   Referring MD: Marvel Plan  Reason for Referral: Stroke  HPI: David [name] is a 54 year old African-American male seen today for initial office visit following hospital consultation for stroke.  History is obtained from the patient and his fiance as well as review of electronic medical records and personally reviewed imaging films in PACS.  Patient presented to [facility] emergency room with no known prior past medical history as a code stroke for sudden onset of slurred speech and facial droop.  CT scan of the head on admission was positive for acute/subacute infarction involving left frontal lobe and operculum.  CT angiogram did not show any large vessel occlusion but showed 7 mm left cavernous ICA aneurysm.  2D echo showed diminished ejection fraction of 20-25% with severely decreased left ventricular function and global hypokinesis but no definite clot was noted.  TEE was also performed which also did not show definite clot or cardiac source of embolism.  LDL cholesterol was 97 mg percent and hemoglobin A1c was 5.1.  Urine drug screen was positive for marijuana.  Patient was discharged on Eliquis after discussion with cardiology given strong possibility that his stroke was likely cardioembolic.  Patient has done well and is slurred speech and facial droop appears to have improved and he has no definite residual deficits.  He states he has cut back smoking and plans to quit completely soon.  He is also stopped using marijuana.  He is tolerating Eliquis well with only minor bruising and no bleeding.  Is currently wearing a 30-day Holter monitor.  He has an appointment to see cardiologist Dr. [name] next month.  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  He also has an appointment to see Dr. Corliss Skains for his cavernous carotid aneurysm but is not so sure that he wants to address this at the present time.  He had a follow-up lipid profile checked on 08/19/2019 and LDL was down to 51 mg percent. ROS:   14 system review of systems is positive for bruising, fatigue, tiredness, shortness of breath and all other systems negative  PMH:  Past Medical History:  Diagnosis Date  . Acute ischemic left MCA stroke ([facility])    a. 08/2019 MRI: Acute L MCA branch vessel infarctions; b. 08/2019 CTA head/neck: Patent bilat carotids/vertebrals. High grade stenosis - inf division of prox L MCA M2 branch. additional high-grade mid-dist superior L MCA M2 branch occlusion. 30mm aneurysm arising from cavernous L ICA.  [name] Cardiomyopathy ([facility])    a. 08/2019 Echo: EF 20-25%, global HK. Gr2 DD. Nl RV fxn. RVSP 30.59mmHg.  Mildly dil LA. Mod MR.  . Cerebral aneurysm    a. 08/2019 CTA Head: 35mm aneurysm arising from the cavernous L ICA.  [name] Facial mass    a. CT head: 7.0 x 2.3 cm soft tissue mass along lat aspect of L temporal bone, involving L ext ear - highly suscpicious for malignancy.  [name] Heart attack ([facility])   . Heart murmur   . Hypertension   . Moderate mitral regurgitation   . Tobacco abuse     Social History:  Social History   Socioeconomic History  . Marital status: Significant Other  Spouse name: Not on file  . Number of children: 6  . Years of education: Not on file  . Highest education level: Not on file  Occupational History  . Not on file  Tobacco Use  . Smoking status: Current Every Day Smoker    Packs/day: 0.50    Years: 35.00    Pack years: 17.50    Types: Cigarettes  . Smokeless tobacco: Never Used  Substance and Sexual Activity  . Alcohol use: No  . Drug use: Not Currently    Types: Marijuana    Comment: smokes several blunts on the weekend.  . Sexual activity: Yes  Other Topics Concern  . Not on file   Social History Narrative   Lives in [place] w/ fiancee.  Does not routinely exercise but has very busy work-life, working M-F 12 hrs shifts and another 8 hrs on Saturdays.  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   Social Determinants of Health   Financial Resource Strain:   . Difficulty of Paying Living Expenses:   Food Insecurity:   . Worried About Programme researcher, broadcasting/film/video in the Last Year:   . Barista in the Last Year:   Transportation Needs:   . Freight forwarder (Medical):   [name] Lack of Transportation (Non-Medical):   Physical Activity:   . Days of Exercise per Week:   . Minutes of Exercise per Session:   Stress:   . Feeling of Stress :   Social Connections:   . Frequency of Communication with Friends and Family:   . Frequency of Social Gatherings with Friends and Family:   . Attends Religious Services:   . Active Member of Clubs or Organizations:   . Attends Banker Meetings:   [name] Marital Status:   Intimate Partner Violence:   . Fear of Current or Ex-Partner:   . Emotionally Abused:   [name] Physically Abused:   . Sexually Abused:     Medications:   Current Outpatient Medications on File Prior to Visit  Medication Sig Dispense Refill  . apixaban (ELIQUIS) 5 MG TABS tablet Take 1 tablet (5 mg total) by mouth 2 (two) times daily. 60 tablet 0  . atorvastatin (LIPITOR) 80 MG tablet TAKE 1 TABLET (80 MG TOTAL) BY MOUTH DAILY AT 6 PM. 30 tablet 8  . carvedilol (COREG) 6.25 MG tablet Take 1 tablet (6.25 mg total) by mouth 2 (two) times daily. 180 tablet 3  . ibuprofen (ADVIL,MOTRIN) 600 MG tablet Take 1 tablet (600 mg total) by mouth every 6 (six) hours as needed. 30 tablet 0  . losartan (COZAAR) 25 MG tablet TAKE 1 TABLET BY MOUTH EVERY DAY 30 tablet 0  . nicotine (NICODERM CQ) 21 mg/24hr patch Place 1 patch (21 mg total) onto the skin daily. 28 patch 1   No current facility-administered medications on file prior to visit.    Allergies:  No  Known Allergies  Physical Exam General: Frail middle-aged African-American male, seated, in no evident distress Head: head normocephalic and atraumatic.   Neck: supple with no carotid or supraclavicular bruits Cardiovascular: regular rate and rhythm, no murmurs Musculoskeletal: no deformity Skin:  no rash/petichiae Vascular:  Normal pulses all extremities  Neurologic Exam Mental Status: Awake and fully alert. Oriented to place and time. Recent and remote memory intact. Attention span, concentration and fund of knowledge appropriate. Mood and affect appropriate.  Cranial Nerves: Fundoscopic exam reveals sharp disc margins. Pupils equal, briskly reactive  to light. Extraocular movements full without nystagmus. Visual fields full to confrontation. Hearing intact. Facial sensation intact. Face, tongue, palate moves normally and symmetrically.  Motor: Normal bulk and tone. Normal strength in all tested extremity muscles. Sensory.: intact to touch , pinprick , position and vibratory sensation.  Coordination: Rapid alternating movements normal in all extremities. Finger-to-nose and heel-to-shin performed accurately bilaterally. Gait and Station: Arises from chair without difficulty. Stance is normal. Gait demonstrates normal stride length and balance . Able to heel, toe and tandem walk with slight t difficulty.  Reflexes: 1+ and symmetric. Toes downgoing.   NIHSS  0 Modified Rankin  0  ASSESSMENT: 54 year old African-American male with embolic left middle cerebral artery infarcts in March 2021 likely secondary to cardiomyopathy etiology from cardiomyopathy with low ejection fraction.  Vascular risk factors of hypertension, hyperlipidemia, cardiomyopathy and smoking     PLAN: I had a long d/w patient and his fiancee about his recent stroke,cardiomyopathy, risk for recurrent stroke/TIAs, personally independently reviewed imaging studies and stroke evaluation results and answered questions.Continue  Eliquis (apixaban) daily  for secondary stroke prevention for now but repeat echocardiogram if ejection fraction has improved beyond 30 to 35% may consider switching Eliquis to aspirin and maintain strict control of hypertension with blood pressure goal below 130/90, diabetes with hemoglobin A1c goal below 6.5% and lipids with LDL cholesterol goal below 70 mg/dL. I also advised the patient to eat a healthy diet with plenty of whole grains, cereals, fruits and vegetables, exercise regularly and maintain ideal body weight .patient was counseled to quit smoking completely cigarettes as well as marijuana.  I also recommend conservative follow-up for his small cerebral aneurysm at least until the till he is remains on Eliquis and does not quit smoking completely.  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  Patient was neurologically cleared to drive without restrictions.  Greater than 50% time during this 50-minute visit was spent on counseling and coordination of care about his stroke and cardiomyopathy and answering questions followup in the future with me in 3 months or call earlier if necessary. Antony Contras, MD  [facility] [address] [place], [place] [address]  Phone [phone] Fax [phone] Note: This document was prepared with digital dictation and possible smart phrase technology. Any transcriptional errors that result from this process are unintentional.

## 2019-10-09 NOTE — Patient Instructions (Addendum)
I had a long d/w patient and his fiancee about his recent stroke,cardiomyopathy, risk for recurrent stroke/TIAs, personally independently reviewed imaging studies and stroke evaluation results and answered questions.Continue Eliquis (apixaban) daily  for secondary stroke prevention for now but repeat echocardiogram if ejection fraction has improved beyond 30 to 35% may consider switching Eliquis to aspirin and maintain strict control of hypertension with blood pressure goal below 130/90, diabetes with hemoglobin A1c goal below 6.5% and lipids with LDL cholesterol goal below 70 mg/dL. I also advised the patient to eat a healthy diet with plenty of whole grains, cereals, fruits and vegetables, exercise regularly and maintain ideal body weight .patient was counseled to quit smoking completely cigarettes as well as marijuana.  I also recommend conservative follow-up for his small cerebral aneurysm at least until the till he is remains on Eliquis and does not quit smoking completely.  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  Patient was neurologically cleared to drive without restrictions.  Followup in the future with me in 3 months or call earlier if necessary.  Stroke Prevention Some medical conditions and behaviors are associated with a higher chance of having a stroke. You can help prevent a stroke by making nutrition, lifestyle, and other changes, including managing any medical conditions you may have. What nutrition changes can be made?   Eat healthy foods. You can do this by: ? Choosing foods high in fiber, such as fresh fruits and vegetables and whole grains. ? Eating at least 5 or more servings of fruits and vegetables a day. Try to fill half of your plate at each meal with fruits and vegetables. ? Choosing lean protein foods, such as lean cuts of meat, poultry without skin,  fish, tofu, beans, and nuts. ? Eating low-fat dairy products. ? Avoiding foods that are high in salt (sodium). This can help lower blood pressure. ? Avoiding foods that have saturated fat, trans fat, and cholesterol. This can help prevent high cholesterol. ? Avoiding processed and premade foods.  Follow your health care provider's specific guidelines for losing weight, controlling high blood pressure (hypertension), lowering high cholesterol, and managing diabetes. These may include: ? Reducing your daily calorie intake. ? Limiting your daily sodium intake to 1,500 milligrams (mg). ? Using only healthy fats for cooking, such as olive oil, canola oil, or sunflower oil. ? Counting your daily carbohydrate intake. What lifestyle changes can be made?  Maintain a healthy weight. Talk to your health care provider about your ideal weight.  Get at least 30 minutes of moderate physical activity at least 5 days a week. Moderate activity includes brisk walking, biking, and swimming.  Do not use any products that contain nicotine or tobacco, such as cigarettes and e-cigarettes. If you need help quitting, ask your health care provider. It may also be helpful to avoid exposure to secondhand smoke.  Limit alcohol intake to no more than 1 drink a day for nonpregnant women and 2 drinks a day for men. One drink equals 12 oz of beer, 5 oz of wine, or 1 oz of hard liquor.  Stop any illegal drug use.  Avoid taking birth control pills. Talk to your health care provider about the risks of taking birth control pills if: ? You are over 82 years old. ? You smoke. ? You get migraines. ? You have ever had a blood clot. What other changes can  be made?  Manage your cholesterol levels. ? Eating a healthy diet is important for preventing high cholesterol. If cholesterol cannot be managed through diet alone, you may also need to take medicines. ? Take any prescribed medicines to control your cholesterol as told by  your health care provider.  Manage your diabetes. ? Eating a healthy diet and exercising regularly are important parts of managing your blood sugar. If your blood sugar cannot be managed through diet and exercise, you may need to take medicines. ? Take any prescribed medicines to control your diabetes as told by your health care provider.  Control your hypertension. ? To reduce your risk of stroke, try to keep your blood pressure below 130/80. ? Eating a healthy diet and exercising regularly are an important part of controlling your blood pressure. If your blood pressure cannot be managed through diet and exercise, you may need to take medicines. ? Take any prescribed medicines to control hypertension as told by your health care provider. ? Ask your health care provider if you should monitor your blood pressure at home. ? Have your blood pressure checked every year, even if your blood pressure is normal. Blood pressure increases with age and some medical conditions.  Get evaluated for sleep disorders (sleep apnea). Talk to your health care provider about getting a sleep evaluation if you snore a lot or have excessive sleepiness.  Take over-the-counter and prescription medicines only as told by your health care provider. Aspirin or blood thinners (antiplatelets or anticoagulants) may be recommended to reduce your risk of forming blood clots that can lead to stroke.  Make sure that any other medical conditions you have, such as atrial fibrillation or atherosclerosis, are managed. What are the warning signs of a stroke? The warning signs of a stroke can be easily remembered as BEFAST.  B is for balance. Signs include: ? Dizziness. ? Loss of balance or coordination. ? Sudden trouble walking.  E is for eyes. Signs include: ? A sudden change in vision. ? Trouble seeing.  F is for face. Signs include: ? Sudden weakness or numbness of the face. ? The face or eyelid drooping to one side.  A  is for arms. Signs include: ? Sudden weakness or numbness of the arm, usually on one side of the body.  S is for speech. Signs include: ? Trouble speaking (aphasia). ? Trouble understanding.  T is for time. ? These symptoms may represent a serious problem that is an emergency. Do not wait to see if the symptoms will go away. Get medical help right away. Call your local emergency services (911 in the [place] Do not drive yourself to the hospital.  Other signs of stroke may include: ? A sudden, severe headache with no known cause. ? Nausea or vomiting. ? Seizure. Where to find more information For more information, visit:  American Stroke Association: www.strokeassociation.org  National Stroke Association: www.stroke.org Summary  You can prevent a stroke by eating healthy, exercising, not smoking, limiting alcohol intake, and managing any medical conditions you may have.  Do not use any products that contain nicotine or tobacco, such as cigarettes and e-cigarettes. If you need help quitting, ask your health care provider. It may also be helpful to avoid exposure to secondhand smoke.  Remember BEFAST for warning signs of stroke. Get help right away if you or a loved one has any of these signs. This information is not intended to replace advice given to you by your health care provider. Make  sure you discuss any questions you have with your health care provider. Document Revised: 05/05/2017 Document Reviewed: 06/28/2016 Elsevier Patient Education  2020 [employer]   Cerebral Aneurysm  A cerebral aneurysm is a bulge that occurs in the blood vessel inside the brain. An aneurysm is caused when a weakened part of the blood vessel expands. The blood vessel expands due to the constant pressure from the flow of blood through the weakened blood vessel. As the weakened aneurysm expands, the walls of the aneurysm become weaker. Aneurysms are dangerous because they can leak or burst (rupture).  When a cerebral aneurysm ruptures, it causes bleeding in the brain (subarachnoid hemorrhage). The blood flow to the area of the brain supplied by the artery is also reduced. This can cause stroke, seizures, or coma. A ruptured cerebral aneurysm is a medical emergency. This can cause permanent brain damage or death. What are the causes? The exact cause of this condition is not known. What increases the risk? This condition is more likely to develop in people who:  Are older. The condition is most common in people between the ages of 33-60.  Are male  Have a family history of aneurysm in two or more direct relatives.  Have certain conditions that are passed along from parent to child (inherited). They include: ? Autosomal dominant polycystic kidney disease. This is a condition in which small, fluid-filled sacs (cysts) develop in the kidney. ? Neurofibromatosis type 1. In this condition, flat spots develop under the skin (pigmentation) and tumors grow along nerves in the skin, brain, and other parts of the body. ? Ehlers-Danlos syndrome. This is a condition in which bad connective tissue causes loose or unstable joints and creates a very soft skin that bruises or tears easily.  Smoke.  Have high blood pressure (hypertension).  Abuse alcohol. What are the signs or symptoms? The signs and symptoms of a cerebral aneurysm that has not leaked or ruptured can depend on its size and rate of growth. A small, unchanging aneurysm generally does not cause symptoms. A larger aneurysm that is steadily growing can increase pressure on the brain or nerves.  The increased pressure from a cerebral aneurysm that has not leaked or ruptured can cause:  A headache.  Vision problems.  Numbness or weakness in an arm or leg.  Memory problems.  Problems speaking.  Seizures. If an aneurysm leaks or ruptures, it can cause a life-threatening condition, such as stroke. Symptoms may include:  A sudden,  severe headache with no known cause. The headache is often described as the worst headache ever experienced.  Stiff neck.  Nausea or vomiting, especially when combined with other symptoms, such as a headache.  Sudden weakness or numbness of the face, arm, or leg, especially on one side of the body.  Sudden trouble walking or difficulty moving the arms or legs.  Double vision.  Sudden trouble seeing in one or both eyes.  Trouble speaking or understanding speech (aphasia).  Trouble swallowing.  Dizziness.  Loss of balance or coordination.  Intolerance to light.  Sudden confusion or loss of consciousness. How is this diagnosed? This condition is diagnosed using certain tests, including:  CT scan.  Computed tomographic angiogram (CTA). This test uses a dye and a scanner to produce images of your blood vessels.  Magnetic resonance angiogram (MRA). This test uses an MRI machine to produce images of your blood vessels.  Digital subtraction angiogram (DSA). This test involves placing a flexible, thin tube (catheter) into the  artery in your thigh and guiding it up to the arteries in the brain. A dye is then injected into the area and X-rays are taken to create images of your blood vessels. How is this treated? Unruptured aneurysm Treatment is complex when an aneurysm is found and it is not causing problems. Treatment is individualized, as each case is different. Many factors must be considered, such as the size and exact location of your aneurysm, your age, your overall health, and your preferences. Small aneurysms in certain locations of the brain have a very low chance of bleeding or rupturing. These small aneurysms may not be treated.  In some cases, however, treatment may be required. Treatment depends on the size and location of the aneurysm. They may include:  Coiling. During this procedure, a catheter is inserted and advanced through a blood vessel. Once the catheter reaches the  aneurysm, tiny coils are used to block blood flow into the aneurysm. This procedure is sometimes done at the same time as a DSA.  Surgical clipping. During surgery, a clip is placed at the base of the aneurysm. The clip prevents blood from continuing to enter the aneurysm.  Flow diversion. This procedure is used to divert blood flow around the aneurysm with a stent that is placed across the opening of an aneurysm. Ruptured aneurysm Immediate emergency surgery or coiling may be needed to help prevent damage to the brain and to reduce the risk of rebleeding. The timing of treatment is an important factor in preventing complications. Successful early treatment of a ruptured aneurysm within the first 3 days of a bleed helps to prevent rebleeding and blood vessel spasm. In some cases, there may be a reason to treat 10-14 days after a rupture. Many factors are considered when making this decision, and each case is handled individually. Follow these instructions at home: If your aneurysm is not treated:  Take over-the-counter and prescription medicines only as told by your health care provider.  Follow a diet suggested by your health care provider. Certain dietary changes may be advised to address high blood pressure (hypertension), such as choosing foods that are low in salt (sodium), saturated fat, trans fat, and cholesterol.  Stay physically active. It is recommended that you get at least 30 minutes of activity on most or all days.  Do not use any products that contain nicotine or tobacco, such as cigarettes and e-cigarettes. If you need help quitting, ask your health care provider.  Limit alcohol intake to no more than 1 drink a day for nonpregnant women and 2 drinks a day for men. One drink equals 12 oz of beer, 5 oz of wine, or 1 oz of hard liquor.  Do not use street drugs. If you need help quitting, ask your health care provider.  Keep all follow-up visits as told by your health care provider.  This is important. This includes any referrals, imaging studies, and laboratory tests. Proper follow-up may prevent an aneurysm rupture or a stroke. Get help right away if:  You have a sudden, severe headache with no known cause. This may include a stiff neck.  You have sudden nausea or vomiting with a severe headache.  You have a seizure.  You have other symptoms of stroke. The acronym BEFAST is an easy way to remember the main warning signs of stroke. ? B = Balance problems. Signs include dizziness, sudden trouble walking, or loss of balance. ? E = Eye problems. This includes trouble seeing or a sudden  change in vision. ? F = Face changes. This includes sudden weakness or numbness of the face, or the face or eyelid drooping to one side. ? A = Arm weakness or numbness. This happens suddenly and usually on one side of the body. ? S = Speech problems. This includes trouble speaking or trouble understanding. ? T = Time. Time to call 911 or seek emergency care. Do not wait to see if symptoms will go away. Make note of the time your symptoms started. These symptoms may represent a serious problem that is an emergency. Do not wait to see if the symptoms will go away. Get medical help right away. Call your local emergency services (911 in the [place] Do not drive yourself to the hospital. Summary  An aneurysm is a bulge in an artery.  Aneurysms are dangerous because they can leak or burst (rupture). When a cerebral aneurysm ruptures, it causes bleeding in the brain.  Treatment depends on whether the aneurysm is ruptured. A ruptured aneurysm is a medical emergency.  Get help right away if you have symptoms of stroke. The acronym BEFAST is an easy way to remember the main warning signs of stroke. This information is not intended to replace advice given to you by your health care provider. Make sure you discuss any questions you have with your health care provider. Document Revised: 02/09/2018  Document Reviewed: 06/30/2016 Elsevier Patient Education  [place].

## 2019-10-10 ENCOUNTER — Telehealth: Payer: Self-pay | Admitting: General Practice

## 2019-10-10 NOTE — Telephone Encounter (Addendum)
Pt c/o medication issue:  1. Name of Medication:   nicotine (NICODERM CQ) 21 mg/24hr patch    2. How are you currently taking this medication (dosage and times per day)? 1 patch once a day  3. Are you having a reaction (difficulty breathing--STAT)? no  4. What is your medication issue? Patient's wife states the patient wants to try nicotine gum, because the patches are not working.

## 2019-10-10 NOTE — Telephone Encounter (Signed)
Left message, patient will need to contact PCP Dr. Vincente [facility] office as they are the one's who wrote for nicotine patches. They will need to be the provider who changes the prescription to nicotine gum.

## 2019-10-10 NOTE — Telephone Encounter (Signed)
*  STAT* If patient is at the pharmacy, call can be transferred to refill team.   1. Which medications need to be refilled? (please list name of each medication and dose if known)  apixaban (ELIQUIS) 5 MG TABS tablet atorvastatin (LIPITOR) 80 MG tablet losartan (COZAAR) 25 MG tablet  2. Which pharmacy/location (including street and city if local pharmacy) is medication to be sent to? [id number] - [place], [place] - [address]  3. Do they need a 30 day or 90 day supply? 30 day

## 2019-10-11 ENCOUNTER — Other Ambulatory Visit: Payer: Self-pay | Admitting: General Practice

## 2019-10-11 NOTE — Telephone Encounter (Signed)
53YO 73.7KG SCR = 1.16

## 2019-10-24 ENCOUNTER — Telehealth: Payer: Self-pay | Admitting: Internal Medicine

## 2019-10-24 NOTE — Telephone Encounter (Signed)
[name] is calling in regards to the paperwork they are waiting on Dr. Ladona Ridgel to fill out wanting to know if it is ready for them to pick up. Please advise.

## 2019-10-28 ENCOUNTER — Telehealth: Payer: Self-pay | Admitting: Internal Medicine

## 2019-10-28 NOTE — Telephone Encounter (Signed)
Reviewed paperwork.  Unable to complete paperwork until after Pt's echocardiogram and f/u with Dr. Ladona Ridgel.  Pt continues to wear lifevest.  Left message for [employer] and Pt's EC notifying paperwork cannot be completed yet.  Left number for this nurse if any questions.

## 2019-10-28 NOTE — Telephone Encounter (Signed)
Accessed patient's chart to see who called her. Reached out to [place] via [employer] and then relayed the telephone note Victorino Dike had documented to the patient's fiance per Jennifer's request.

## 2019-11-02 ENCOUNTER — Other Ambulatory Visit: Payer: Self-pay | Admitting: Student

## 2019-11-04 NOTE — Progress Notes (Signed)
Cardiology Clinic Note   Patient Name: David [name] Date of Encounter: 11/05/2019  Primary Care Provider:  Janeece Agee, NP Primary Cardiologist:  Olga Millers, MD  Patient Profile    David [name] 54 year old male presents today for follow-up evaluation of his chronic systolic CHF and cardiomyopathy.  Past Medical History    Past Medical History:  Diagnosis Date  . Acute ischemic left MCA stroke (HCC)    a. 08/2019 MRI: Acute L MCA branch vessel infarctions; b. 08/2019 CTA head/neck: Patent bilat carotids/vertebrals. High grade stenosis - inf division of prox L MCA M2 branch. additional high-grade mid-dist superior L MCA M2 branch occlusion. 39mm aneurysm arising from cavernous L ICA.  [name] Cardiomyopathy (HCC)    a. 08/2019 Echo: EF 20-25%, global HK. Gr2 DD. Nl RV fxn. RVSP 30.67mmHg.  Mildly dil LA. Mod MR.  . Cerebral aneurysm    a. 08/2019 CTA Head: 60mm aneurysm arising from the cavernous L ICA.  [name] Facial mass    a. CT head: 7.0 x 2.3 cm soft tissue mass along lat aspect of L temporal bone, involving L ext ear - highly suscpicious for malignancy.  [name] Heart attack (HCC)   . Heart murmur   . Hypertension   . Moderate mitral regurgitation   . Tobacco abuse    Past Surgical History:  Procedure Laterality Date  . BUBBLE STUDY  08/12/2019   Procedure: BUBBLE STUDY;  Surgeon: Parke Poisson, MD;  Location: [facility] ENDOSCOPY;  Service: Cardiology;;  . EXTERNAL EAR SURGERY    . TEE WITHOUT CARDIOVERSION N/A 08/12/2019   Procedure: TRANSESOPHAGEAL ECHOCARDIOGRAM (TEE);  Surgeon: Parke Poisson, MD;  Location: [facility] ENDOSCOPY;  Service: Cardiology;  Laterality: N/A;    Allergies  No Known Allergies  History of Present Illness    David [name] has a PMH of acute ischemic CVA, chronic systolic CHF, HTN, chest pain, cardiomyopathy, marijuana use and tobacco abuse.  He was seen by cardiology on 08/11/2019 as a consult for new cardiomyopathy.  He had not seen a healthcare provider in  many years.  He works 68 hours a week and had done this for around 20 years.  His work was somewhat physical and he performed tasks without physical limitations.  On 3/21 he noted chest discomfort that lasted for about 15 minutes and resolve spontaneously.  He mentioned this to his fiance but then did not seek medical attention for further evaluation.  On 08/09/2019 he returned home from work and was noted to have right facial droop and slurred speech.  He was taken by EMS to [facility] ED.  A CT of his head showed no hemorrhage however, subacute infarct in the mid left frontal lobe and left frontal operculum or identified.  He was mildly hypertensive at presentation.  He was also noted to have a 7 x 2.3 cm soft tissue mass along the lateral aspect of his left temporal bone.  This was highly suspicious for malignancy.  He was admitted and underwent CTA of his head and neck which showed high-grade stenosis in the inferior division of the proximal left MCA M2 branch and high-grade stenosis in the mid to distal posterior left MCA M2 branch.  A 7 mm aneurysm arising from his left ICA was also noted.  An MRI showed acute left MCA branch vessel infarctions consistent with embolic stroke.  An echocardiogram 08/10/2019 showed an EF of 20-25% with global hypokinesis and grade 2 diastolic dysfunction.  Moderate mitral valve regurgitation.  He  denied dyspnea on exertion, PND, orthopnea, and lower extremity edema.  During his last cardiology visit 08/14/2019 he denied chest pain and indicated he was feeling slightly better.  EKG showed normal sinus rhythm with PVCs and one run of nonsustained ventricular tachycardia that lasted 15 beats.  TEE 08/12/2019 showed EF 20-25%, global hypokinesis, mildly dilated left atria, and tiny patent foramen ovale.  Cardiac MRI 08/13/2019 showed severely dilated left ventricle with mild concentric hypertrophy and severely decreased systolic function LVEF 20%.  Mild mitral and tricuspid  regurgitation.  Findings suspicious for ischemic cardiomyopathy, ischemic work-up recommended.  Evidence of prethrombotic state there was no evidence of clot/thrombus however it was believed that thrombus may have been cause.  Patient was anticoagulated previously by neurology service.  With a plan to transition losartan to [facility] as outpatient.  He was given LifeVest at discharge with a plan for possible ICD in 3 months.  He presents the clinic today for follow-up evaluation and states he does not feel any different than he did prior to going to the hospital.  He has been wearing his LifeVest as directed.  He does not have any increased shortness of breath and denies chest pain.  I will stop his losartan today and start him on Entresto.  I will have his BMP checked in 1 week and have him follow-up in 3 to 4 weeks for further evaluation.  Today he denies chest pain, shortness of breath, lower extremity edema, fatigue, palpitations, melena, hematuria, hemoptysis, diaphoresis, weakness, presyncope, syncope, orthopnea, and PND.     Home Medications    Prior to Admission medications   Medication Sig Start Date End Date Taking? Authorizing Provider  atorvastatin (LIPITOR) 80 MG tablet TAKE 1 TABLET (80 MG TOTAL) BY MOUTH DAILY AT 6 PM. 10/07/19   Tillery, Mariam Dollar, PA-C  carvedilol (COREG) 6.25 MG tablet Take 1 tablet (6.25 mg total) by mouth 2 (two) times daily. 09/18/19   Graciella Freer, PA-C  ELIQUIS 5 MG TABS tablet TAKE 1 TABLET BY MOUTH TWICE A DAY 10/11/19   Lewayne Bunting, MD  ibuprofen (ADVIL,MOTRIN) 600 MG tablet Take 1 tablet (600 mg total) by mouth every 6 (six) hours as needed. 05/13/18   Fawze, Mina A, PA-C  losartan (COZAAR) 25 MG tablet TAKE 1 TABLET BY MOUTH EVERY DAY 10/07/19   Graciella Freer, PA-C  nicotine (NICODERM CQ) 21 mg/24hr patch Place 1 patch (21 mg total) onto the skin daily. 09/12/19   Janeece Agee, NP    Family History    Family History  Problem  Relation Age of Onset  . Cancer Mother        died @ 64  . Other Father        died of CO poisoning as a young man.  . Stroke Sister    He indicated that his mother is deceased. He indicated that his father is deceased. He indicated that his sister is alive.  Social History    Social History   Socioeconomic History  . Marital status: Significant Other    Spouse name: Not on file  . Number of children: 6  . Years of education: Not on file  . Highest education level: Not on file  Occupational History  . Not on file  Tobacco Use  . Smoking status: Current Every Day Smoker    Packs/day: 0.50    Years: 35.00    Pack years: 17.50    Types: Cigarettes  . Smokeless tobacco: Never Used  Substance and Sexual Activity  . Alcohol use: No  . Drug use: Not Currently    Types: Marijuana    Comment: smokes several blunts on the weekend.  . Sexual activity: Yes  Other Topics Concern  . Not on file  Social History Narrative   Lives in [place] w/ fiancee.  Does not routinely exercise but has very busy work-life, working M-F 12 hrs shifts and another 8 hrs on Saturdays.  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   Social Determinants of Health   Financial Resource Strain:   . Difficulty of Paying Living Expenses:   Food Insecurity:   . Worried About Programme researcher, broadcasting/film/video in the Last Year:   . Barista in the Last Year:   Transportation Needs:   . Freight forwarder (Medical):   [name] Lack of Transportation (Non-Medical):   Physical Activity:   . Days of Exercise per Week:   . Minutes of Exercise per Session:   Stress:   . Feeling of Stress :   Social Connections:   . Frequency of Communication with Friends and Family:   . Frequency of Social Gatherings with Friends and Family:   . Attends Religious Services:   . Active Member of Clubs or Organizations:   . Attends Banker Meetings:   [name] Marital Status:   Intimate Partner Violence:   . Fear of  Current or Ex-Partner:   . Emotionally Abused:   [name] Physically Abused:   . Sexually Abused:      Review of Systems    General:  No chills, fever, night sweats or weight changes.  Cardiovascular:  No chest pain, dyspnea on exertion, edema, orthopnea, palpitations, paroxysmal nocturnal dyspnea. Dermatological: No rash, lesions/masses Respiratory: No cough, dyspnea Urologic: No hematuria, dysuria Abdominal:   No nausea, vomiting, diarrhea, bright red blood per rectum, melena, or hematemesis Neurologic:  No visual changes, wkns, changes in mental status. All other systems reviewed and are otherwise negative except as noted above.  Physical Exam    VS:  BP 138/76 (BP Location: Left Arm, Patient Position: Sitting, Cuff Size: Normal)   Pulse 71   Temp 98.4 F (36.9 C)   Ht 5\' 11"  (1.803 m)   Wt 176 lb (79.8 kg)   SpO2 96%   BMI 24.55 kg/m  , BMI Body mass index is 24.55 kg/m. GEN: Well nourished, well developed, in no acute distress. HEENT: normal. Neck: Supple, no JVD, carotid bruits, or masses. Cardiac: RRR, no murmurs, rubs, or gallops. No clubbing, cyanosis, edema.  Radials/DP/PT 2+ and equal bilaterally.  Respiratory:  Respirations regular and unlabored, clear to auscultation bilaterally. GI: Soft, nontender, nondistended, BS + x 4. MS: no deformity or atrophy. Skin: warm and dry, no rash. Neuro:  Strength and sensation are intact. Psych: Normal affect.  Accessory Clinical Findings    ECG personally reviewed by me today-none today.    EKG 08/09/2019 Sinus rhythm 80 bpm  Cardiac MRI 08/13/2019 IMPRESSION: 1. Severely dilated left ventricle with mild concentric hypertrophy and severely decreased systolic function (LVEF = 20%). There is severe diffuse hypokinesis with akinesis in the basal, mid and apical inferior wall. There is transmural late gadolinium enhancement in the basal, mid and apical inferior walls. There is a heavy smoke/sludge in the left ventricular apex  consistent with pre-thrombotic state. Systemic anticoagulation is recommended.  2. Normal right ventricular size, thickness and mildly decreased systolic function (LVEF = 39%). There are no  regional wall motion abnormalities.  3. Normal left and right atrial size.  4. Normal size of the aortic root, ascending aorta and pulmonary artery.  5. Mild mitral and tricuspid regurgitation.  6. Normal pericardium.  No pericardial effusion.  These findings are suspicious for an ischemic cardiomyopathy, an ischemic workup is recommended.  Echocardiogram 09/04/2019 IMPRESSIONS    1. No LV thombus is seen. Left ventricular ejection fraction, by  estimation, is 20 to 25%. The left ventricle has severely decreased  function. The left ventricle demonstrates global hypokinesis. The left  ventricular internal cavity size was severely  dilated. Left ventricular diastolic parameters are consistent with Grade  II diastolic dysfunction (pseudonormalization). Elevated left atrial  pressure.  2. Right ventricular systolic function is normal. The right ventricular  size is normal. There is normal pulmonary artery systolic pressure. The  estimated right ventricular systolic pressure is 25.3 mmHg.  3. Left atrial size was mildly dilated.  4. The mitral valve is normal in structure. Mild to moderate mitral valve  regurgitation.  5. The aortic valve is normal in structure. Aortic valve regurgitation is  not visualized.  6. The inferior vena cava is normal in size with greater than 50%  respiratory variability, suggesting right atrial pressure of 3 mmHg.   Comparison(s): Prior images reviewed side by side. Changes from prior  study are noted. Although LV systolic function is unchanged, there is less  mitral regurgitation and the signs of high left atrial pressure are  improved.  ECHO TEE 08/12/2019  1. Left ventricular ejection fraction, by estimation, is 20 to 25%. The  left ventricle  has severely decreased function. The left ventricle  demonstrates global hypokinesis. The left ventricular internal cavity size  was moderately dilated.  2. Right ventricular systolic function is normal. The right ventricular  size is normal.  3. Left atrial size was mildly dilated. No left atrial/left atrial  appendage thrombus was detected. The LAA emptying velocity was 105 cm/s.  4. The mitral valve is grossly normal. Mild mitral valve regurgitation.  5. The aortic valve is tricuspid. Aortic valve regurgitation is trivial.  6. There is borderline dilatation of the ascending aorta measuring 38 mm.  There is mild (Grade II) atheroma plaque involving the transverse and  descending aorta.  7. Evidence of atrial level shunting detected by color flow Doppler.  Agitated saline contrast bubble study was positive with shunting observed  within 3-6 cardiac cycles suggestive of interatrial shunt. There is a tiny  patent foramen ovale.   Assessment & Plan   1.  Cardiomyopathy-no increased dyspnea or activity intolerance today.  Echocardiogram 09/04/2019 stable compared to previous LVEF 20-25%, G2 DD, trivial aortic valve regurgitation, left atrium mildly dilated.  Blood pressure today 138/76 Continue atorvastatin, carvedilol, Eliquis Stopped losartan Start Entresto  Heart healthy low-sodium diet-salty 6 given Increase physical activity as tolerated Order BMP today and in 4 weeks Ischemic evaluation plan for after CVA recovery. Continue LifeVest BMP in one week   Essential hypertension-BP today 138/76 Continue carvedilol Start Entresto Heart healthy low-sodium diet-salty 6 given Increase physical activity as tolerated  L MCA CVA-TEE showed PFO, no clot/thrombus.  EP consulted for loop recorder placement.  He was not a candidate for loop given his low EF and possible need for ICD.  Left cranial mass-7 x 2.3 cm. Followed by PCP/neurology  Disposition: Follow-up with me in 3-4 weeks  and EP in 1 week to discuss  ICD and have blood work drawn at that time.  Verdon Cummins  Dagmar Hait NP-C    11/05/2019, 2:14 PM [address] Office [phone] Fax [phone]

## 2019-11-05 ENCOUNTER — Encounter: Payer: Self-pay | Admitting: General Practice

## 2019-11-05 ENCOUNTER — Ambulatory Visit (INDEPENDENT_AMBULATORY_CARE_PROVIDER_SITE_OTHER): Payer: Self-pay | Admitting: General Practice

## 2019-11-05 ENCOUNTER — Other Ambulatory Visit: Payer: Self-pay

## 2019-11-05 VITALS — BP 138/76 | HR 71 | Temp 98.4°F | Ht 71.0 in | Wt 176.0 lb

## 2019-11-05 DIAGNOSIS — I639 Cerebral infarction, unspecified: Secondary | ICD-10-CM

## 2019-11-05 DIAGNOSIS — Z79899 Other long term (current) drug therapy: Secondary | ICD-10-CM

## 2019-11-05 DIAGNOSIS — I10 Essential (primary) hypertension: Secondary | ICD-10-CM

## 2019-11-05 DIAGNOSIS — I429 Cardiomyopathy, unspecified: Secondary | ICD-10-CM

## 2019-11-05 MED ORDER — ENTRESTO 24-26 MG PO TABS: 1.0000 | ORAL_TABLET | Freq: Two times a day (BID) | ORAL | 6 refills | Status: DC

## 2019-11-05 NOTE — Patient Instructions (Signed)
Medication Instructions:  STOP LOSARTAN   START ENTRESTO 24/26MG  TWICE DAILY  *If you need a refill on your cardiac medications before your next appointment, please call your pharmacy*  Lab Work: BMET IN 1 WEEK AT DR Lubertha Basque OFFICE AT [facility] If you have labs (blood work) drawn today and your tests are completely normal, you will receive your results only by:  MyChart Message (if you have MyChart) OR A paper copy in the mail.  If you have any lab test that is abnormal or we need to change your treatment, we will call you to review the results. You may go to any [facility] that is convenient for you however, we do have a lab in our office that is able to assist you. You DO NOT need an appointment for our lab. The lab is open 8:00am and closes at 4:00pm. Lunch 12:45 - 1:45pm.  Special Instructions  PLEASE READ AND FOLLOW SALTY 6-ATTACHED  Follow-Up: Your next appointment:  3 week(s)  In Person with [name], FNP  At [facility], you and your health needs are our priority.  As part of our continuing mission to provide you with exceptional heart care, we have created designated Provider Care Teams.  These Care Teams include your primary Cardiologist (physician) and Advanced Practice Providers (APPs -  Physician Assistants and Nurse Practitioners) who all work together to provide you with the care you need, when you need it.  We recommend signing up for the patient portal called "MyChart".  Sign up information is provided on this After Visit Summary.  MyChart is used to connect with patients for Virtual Visits (Telemedicine).  Patients are able to view lab/test results, encounter notes, upcoming appointments, etc.  Non-urgent messages can be sent to your provider as well.   To learn more about what you can do with MyChart, go to [email or web address].

## 2019-11-06 ENCOUNTER — Ambulatory Visit (HOSPITAL_COMMUNITY): Payer: Self-pay | Attending: Cardiology

## 2019-11-06 ENCOUNTER — Encounter (HOSPITAL_COMMUNITY): Payer: Self-pay | Admitting: Radiology

## 2019-11-06 DIAGNOSIS — I422 Other hypertrophic cardiomyopathy: Secondary | ICD-10-CM

## 2019-11-06 DIAGNOSIS — I639 Cerebral infarction, unspecified: Secondary | ICD-10-CM

## 2019-11-06 LAB — BASIC METABOLIC PANEL
BUN/Creatinine Ratio: 16 (ref 9–20)
BUN: 19 mg/dL (ref 6–24)
CO2: 23 mmol/L (ref 20–29)
Calcium: 9.5 mg/dL (ref 8.7–10.2)
Chloride: 102 mmol/L (ref 96–106)
Creatinine, Ser: 1.22 mg/dL (ref 0.76–1.27)
GFR calc Af Amer: 77 mL/min/{1.73_m2} (ref >59)
GFR calc non Af Amer: 66 mL/min/{1.73_m2} (ref >59)
Glucose: 151 mg/dL — ABNORMAL HIGH (ref 65–99)
Potassium: 3.7 mmol/L (ref 3.5–5.2)
Sodium: 140 mmol/L (ref 134–144)

## 2019-11-06 NOTE — Progress Notes (Unsigned)
Due to registration error patient marked as no show. No show status was verified with front table status.

## 2019-11-06 NOTE — Progress Notes (Unsigned)
Verified appointment "no show" status with [name] at 8:43 am.

## 2019-11-06 NOTE — Telephone Encounter (Signed)
Received notification from [employer] that the pt has requested cancellation of monitor... Order will be cancelled. 

## 2019-11-08 NOTE — Progress Notes (Signed)
Kindly inform patient that echo study shows slight reduced pumping function but improved compared to previous echo from March 2021

## 2019-11-12 ENCOUNTER — Encounter: Payer: Self-pay | Admitting: Internal Medicine

## 2019-11-12 ENCOUNTER — Other Ambulatory Visit: Payer: Self-pay

## 2019-11-12 ENCOUNTER — Ambulatory Visit (INDEPENDENT_AMBULATORY_CARE_PROVIDER_SITE_OTHER): Payer: Self-pay | Admitting: Internal Medicine

## 2019-11-12 VITALS — BP 112/76 | HR 84 | Ht 71.0 in | Wt 174.0 lb

## 2019-11-12 DIAGNOSIS — I639 Cerebral infarction, unspecified: Secondary | ICD-10-CM | POA: Insufficient documentation

## 2019-11-12 DIAGNOSIS — I5022 Chronic systolic (congestive) heart failure: Secondary | ICD-10-CM

## 2019-11-12 LAB — BASIC METABOLIC PANEL
BUN/Creatinine Ratio: 10 (ref 9–20)
BUN: 11 mg/dL (ref 6–24)
CO2: 26 mmol/L (ref 20–29)
Calcium: 9.1 mg/dL (ref 8.7–10.2)
Chloride: 103 mmol/L (ref 96–106)
Creatinine, Ser: 1.12 mg/dL (ref 0.76–1.27)
GFR calc Af Amer: 85 mL/min/{1.73_m2} (ref >59)
GFR calc non Af Amer: 74 mL/min/{1.73_m2} (ref >59)
Glucose: 86 mg/dL (ref 65–99)
Potassium: 4.4 mmol/L (ref 3.5–5.2)
Sodium: 141 mmol/L (ref 134–144)

## 2019-11-12 MED ORDER — ASPIRIN EC 81 MG PO TBEC: 81.0000 mg | DELAYED_RELEASE_TABLET | Freq: Every day | ORAL | 3 refills | Status: AC

## 2019-11-12 NOTE — Telephone Encounter (Signed)
Paperwork completed and returned to Pt.  Per Dr. Ladona Ridgel Pt can return to work on June 13,2021 with no restrictions.  Lifevest discontinued.

## 2019-11-12 NOTE — Patient Instructions (Addendum)
Medication Instructions:  Your physician has recommended you make the following change in your medication:   1.  STOP ELIQUIS  2.  START taking ASPIRIN 81 mg-one tablet by mouth daily   Labwork: You will get lab work today:  BMP  Testing/Procedures: None ordered.  Follow-Up: Your physician wants you to follow-up in: one year with Dr. Ladona Ridgel.  You will receive a reminder letter in the mail two months in advance. If you don't receive a letter, please call our office to schedule the follow-up appointment.   Any Other Special Instructions Will Be Listed Below (If Applicable).  If you need a refill on your cardiac medications before your next appointment, please call your pharmacy.

## 2019-11-12 NOTE — Progress Notes (Signed)
HPI [name] returns today for followup. He is a 54 yo man with a stroke who was found to have LV dysfunction. He was placed on Eliquis. He has been on maximal medical therapy. He would like to go back to work. He has been wearing a life vest. His repeat echo from a week ago demonstrates an EF of 45%. He has not had syncope or any shock from his life vest. No Known Allergies   Current Outpatient Medications  Medication Sig Dispense Refill  . atorvastatin (LIPITOR) 80 MG tablet TAKE 1 TABLET (80 MG TOTAL) BY MOUTH DAILY AT 6 PM. 30 tablet 8  . carvedilol (COREG) 6.25 MG tablet Take 1 tablet (6.25 mg total) by mouth 2 (two) times daily. 180 tablet 3  . ELIQUIS 5 MG TABS tablet TAKE 1 TABLET BY MOUTH TWICE A DAY 60 tablet 1  . ibuprofen (ADVIL,MOTRIN) 600 MG tablet Take 1 tablet (600 mg total) by mouth every 6 (six) hours as needed. 30 tablet 0  . nicotine (NICODERM CQ) 21 mg/24hr patch Place 1 patch (21 mg total) onto the skin daily. 28 patch 1  . sacubitril-valsartan (ENTRESTO) 24-26 MG Take 1 tablet by mouth 2 (two) times daily. 60 tablet 6   No current facility-administered medications for this visit.     Past Medical History:  Diagnosis Date  . Acute ischemic left MCA stroke ([place])    a. 08/2019 MRI: Acute L MCA branch vessel infarctions; b. 08/2019 CTA head/neck: Patent bilat carotids/vertebrals. High grade stenosis - inf division of prox L MCA M2 branch. additional high-grade mid-dist superior L MCA M2 branch occlusion. 74mm aneurysm arising from cavernous L ICA.  [name] Cardiomyopathy ([place])    a. 08/2019 Echo: EF 20-25%, global HK. Gr2 DD. Nl RV fxn. RVSP 30.24mmHg.  Mildly dil LA. Mod MR.  . Cerebral aneurysm    a. 08/2019 CTA Head: 83mm aneurysm arising from the cavernous L ICA.  [name] Facial mass    a. CT head: 7.0 x 2.3 cm soft tissue mass along lat aspect of L temporal bone, involving L ext ear - highly suscpicious for malignancy.  [name] Heart attack ([place])   . Heart murmur   .  Hypertension   . Moderate mitral regurgitation   . Tobacco abuse     ROS:   All systems reviewed and negative except as noted in the HPI.   Past Surgical History:  Procedure Laterality Date  . BUBBLE STUDY  08/12/2019   Procedure: BUBBLE STUDY;  Surgeon: Elouise Munroe, MD;  Location: [place];  Service: Cardiology;;  . EXTERNAL EAR SURGERY    . TEE WITHOUT CARDIOVERSION N/A 08/12/2019   Procedure: TRANSESOPHAGEAL ECHOCARDIOGRAM (TEE);  Surgeon: Elouise Munroe, MD;  Location: [place];  Service: Cardiology;  Laterality: N/A;     Family History  Problem Relation Age of Onset  . Cancer Mother        died @ 79  . Other Father        died of CO poisoning as a young man.  . Stroke Sister      Social History   Socioeconomic History  . Marital status: Significant Other    Spouse name: Not on file  . Number of children: 6  . Years of education: Not on file  . Highest education level: Not on file  Occupational History  . Not on file  Tobacco Use  . Smoking status: Current Every Day Smoker  Packs/day: 0.50    Years: 35.00    Pack years: 17.50    Types: Cigarettes  . Smokeless tobacco: Never Used  Substance and Sexual Activity  . Alcohol use: No  . Drug use: Not Currently    Types: Marijuana    Comment: smokes several blunts on the weekend.  . Sexual activity: Yes  Other Topics Concern  . Not on file  Social History Narrative   Lives in [place] w/ fiancee.  Does not routinely exercise but has very busy work-life, working M-F 12 hrs shifts and another 8 hrs on Saturdays.  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   Social Determinants of Health   Financial Resource Strain:   . Difficulty of Paying Living Expenses:   Food Insecurity:   . Worried About Programme researcher, broadcasting/film/video in the Last Year:   . Barista in the Last Year:   Transportation Needs:   . Freight forwarder (Medical):   [name] Lack of Transportation (Non-Medical):     Physical Activity:   . Days of Exercise per Week:   . Minutes of Exercise per Session:   Stress:   . Feeling of Stress :   Social Connections:   . Frequency of Communication with Friends and Family:   . Frequency of Social Gatherings with Friends and Family:   . Attends Religious Services:   . Active Member of Clubs or Organizations:   . Attends Banker Meetings:   [name] Marital Status:   Intimate Partner Violence:   . Fear of Current or Ex-Partner:   . Emotionally Abused:   [name] Physically Abused:   . Sexually Abused:      BP 112/76   Pulse 84   Ht 5\' 11"  (1.803 m)   Wt 174 lb (78.9 kg)   SpO2 97%   BMI 24.27 kg/m   Physical Exam:  Well appearing NAD HEENT: Unremarkable Neck:  No JVD, no thyromegally Lymphatics:  No adenopathy Back:  No CVA tenderness Lungs:  Clear with no wheezes HEART:  Regular rate rhythm, no murmurs, no rubs, no clicks Abd:  soft, positive bowel sounds, no organomegally, no rebound, no guarding Ext:  2 plus pulses, no edema, no cyanosis, no clubbing Skin:  No rashes no nodules Neuro:  CN II through XII intact, motor grossly intact  Assess/Plan: 1. Stroke - he will stop the Eliquis and start ASA as his EF has improved.  2. Chronic systolic heart failure - on medical therapy, his EF is much improved from 25 to 45%. He will continue entresto and coreg. He will stop his Life vest as his EF is improved. .D.

## 2019-11-13 ENCOUNTER — Telehealth: Payer: Self-pay

## 2019-11-13 NOTE — Telephone Encounter (Signed)
-----   Message from Micki Riley, MD sent at 11/08/2019  6:27 AM EDT ----- Joneen Roach inform patient that echo study shows slight reduced pumping function but improved compared to previous echo from March 2021

## 2019-11-13 NOTE — Telephone Encounter (Signed)
I receive call about pt echo results. I stated  that echo study shows slight reduced pumping function but improved compared to previous echo from March 2021. Pt verbalized understanding.

## 2019-11-13 NOTE — Telephone Encounter (Signed)
Left voice mail for patient to call back about echocardiogram results

## 2019-11-20 ENCOUNTER — Telehealth: Payer: Self-pay | Admitting: General Practice

## 2019-11-20 NOTE — Telephone Encounter (Signed)
Pt c/o medication issue:  1. Name of Medication:  Entresto  2. How are you currently taking this medication (dosage and times per day)? 2 times a day  3. Are you having a reaction (difficulty breathing--STAT)? no  4. What is your medication issue?  Back hurting , vomiting up bleed

## 2019-11-20 NOTE — Telephone Encounter (Signed)
Called and spoke to patient and his wife-  She states that patient went back to work today, and while at work his back began to hurt really bad, he did throw up and he noticed some blood in it. He states she does not have chest pain, shortness of breath or swelling at this time, I asked if he noticed any burning when urinating, or blood in urine- and he denied. Patient does say it is his lower back, where his hips are- did advise this could be kidney related.  They advised that no changes had been made- he was placed on ASA 06/08- and Started Entresto 06/01.  Wife feels he was sent back to work to quick- they would like suggestions on what should be done.   Advised I would route a message to Primary Cardiologist Dr.Taylor- and his nurse Victorino Dike- Patient does have appointment with [name], NP at the [facility] office on 06/23.

## 2019-11-20 NOTE — Telephone Encounter (Signed)
Please advise 

## 2019-11-21 ENCOUNTER — Emergency Department (HOSPITAL_COMMUNITY)
Admission: EM | Admit: 2019-11-21 | Discharge: 2019-11-22 | Disposition: A | Discharge status: Home / Self Care | Payer: Self-pay | Attending: Emergency Medicine | Admitting: Emergency Medicine

## 2019-11-21 ENCOUNTER — Other Ambulatory Visit: Payer: Self-pay

## 2019-11-21 DIAGNOSIS — F17210 Nicotine dependence, cigarettes, uncomplicated: Secondary | ICD-10-CM | POA: Insufficient documentation

## 2019-11-21 DIAGNOSIS — Z79899 Other long term (current) drug therapy: Secondary | ICD-10-CM | POA: Insufficient documentation

## 2019-11-21 DIAGNOSIS — I5022 Chronic systolic (congestive) heart failure: Secondary | ICD-10-CM | POA: Insufficient documentation

## 2019-11-21 DIAGNOSIS — I110 Hypertensive heart disease with heart failure: Secondary | ICD-10-CM | POA: Insufficient documentation

## 2019-11-21 DIAGNOSIS — R109 Unspecified abdominal pain: Secondary | ICD-10-CM

## 2019-11-21 DIAGNOSIS — R1033 Periumbilical pain: Secondary | ICD-10-CM | POA: Insufficient documentation

## 2019-11-21 DIAGNOSIS — R112 Nausea with vomiting, unspecified: Secondary | ICD-10-CM | POA: Insufficient documentation

## 2019-11-21 LAB — BASIC METABOLIC PANEL
Anion gap: 10 (ref 5–15)
BUN: 30 mg/dL — ABNORMAL HIGH (ref 6–20)
CO2: 24 mmol/L (ref 22–32)
Calcium: 9.2 mg/dL (ref 8.9–10.3)
Chloride: 101 mmol/L (ref 98–111)
Creatinine, Ser: 1.86 mg/dL — ABNORMAL HIGH (ref 0.61–1.24)
GFR calc Af Amer: 46 mL/min — ABNORMAL LOW (ref >60)
GFR calc non Af Amer: 40 mL/min — ABNORMAL LOW (ref >60)
Glucose, Bld: 112 mg/dL — ABNORMAL HIGH (ref 70–99)
Potassium: 4.5 mmol/L (ref 3.5–5.1)
Sodium: 135 mmol/L (ref 135–145)

## 2019-11-21 LAB — CBC
HCT: 46.5 % (ref 39.0–52.0)
Hemoglobin: 15.7 g/dL (ref 13.0–17.0)
MCH: 31.8 pg (ref 26.0–34.0)
MCHC: 33.8 g/dL (ref 30.0–36.0)
MCV: 94.3 fL (ref 80.0–100.0)
Platelets: 233 10*3/uL (ref 150–400)
RBC: 4.93 MIL/uL (ref 4.22–5.81)
RDW: 11.8 % (ref 11.5–15.5)
WBC: 8.2 10*3/uL (ref 4.0–10.5)
nRBC: 0 % (ref 0.0–0.2)

## 2019-11-21 LAB — URINALYSIS, ROUTINE W REFLEX MICROSCOPIC
Bilirubin Urine: NEGATIVE
Glucose, UA: NEGATIVE mg/dL
Ketones, ur: NEGATIVE mg/dL
Nitrite: NEGATIVE
Protein, ur: NEGATIVE mg/dL
Specific Gravity, Urine: 1.027 (ref 1.005–1.030)
pH: 5 (ref 5.0–8.0)

## 2019-11-21 NOTE — Telephone Encounter (Signed)
FU with [name] as scheduled; low back pain may be primary care issue; if symptoms worsen, needs to be seen in ER. David [name]

## 2019-11-21 NOTE — Telephone Encounter (Signed)
Called wife- after receiving another call that her husband was still not feeling well with back pain, and throwing up blood- suggestion to go to ER was brought up, she states they will take him there.

## 2019-11-21 NOTE — Telephone Encounter (Signed)
Patient's wife requesting to speak with Dr. Ladona Ridgel or his nurse. She states that Dr. Ladona Ridgel sent her husband back to work but now he is on his way home from work today because his back hurts and he has been coughing up blood. She thinks that Dr. Ladona Ridgel needs to take him out of work.

## 2019-11-21 NOTE — ED Triage Notes (Signed)
Pt here for eval of bilateral flank pain since Monday. Reports nausea and vomit x 1 with blood yesterday.

## 2019-11-22 ENCOUNTER — Emergency Department (HOSPITAL_COMMUNITY): Payer: Self-pay

## 2019-11-22 MED ORDER — ONDANSETRON HCL 4 MG/2ML IJ SOLN
4.0000 mg | Freq: Once | INTRAMUSCULAR | Status: AC
  Administered 2019-11-22: 4 mg via INTRAVENOUS
  Filled 2019-11-22: qty 2

## 2019-11-22 MED ORDER — ONDANSETRON 4 MG PO TBDP: 4.0000 mg | ORAL_TABLET | Freq: Once | ORAL | Status: DC

## 2019-11-22 MED ORDER — SODIUM CHLORIDE 0.9 % IV BOLUS
500.0000 mL | Freq: Once | INTRAVENOUS | Status: AC
  Administered 2019-11-22: 500 mL via INTRAVENOUS

## 2019-11-22 MED ORDER — IOHEXOL 9 MG/ML PO SOLN
ORAL | Status: AC
  Filled 2019-11-22: qty 500

## 2019-11-22 NOTE — ED Provider Notes (Signed)
.Care of the patient was assumed from [name] PA-C at 640; see this physician's note for complete history of present illness, review of systems, and physical exam.  Briefly, the patient is a 53 y.o. male who presented to the ED with abdominal pain with associated nausea/vomiting and streaks of blood in his emesis.  No gross blood.  Patient denies any chest pain, shortness of breath,fevers, chills.  See previous PAs note for full HPI.  History significant for MCA stroke, cardiomyopathy with EF of 40 to 45%, hypertension, mitral regurg.  Plan at time of handoff:  Per previous PA, awaiting CT and discharge if negative.      Physical Exam  BP 125/82 (BP Location: Right Arm)   Pulse 86   Temp 98.9 F (37.2 C)   Resp 18   SpO2 99%   Physical Exam Constitutional:      General: He is not in acute distress.    Appearance: Normal appearance. He is not ill-appearing, toxic-appearing or diaphoretic.  HENT:     Mouth/Throat:     Mouth: Mucous membranes are moist.     Pharynx: Oropharynx is clear.  Eyes:     General: No scleral icterus.    Extraocular Movements: Extraocular movements intact.     Pupils: Pupils are equal, round, and reactive to light.  Cardiovascular:     Rate and Rhythm: Normal rate and regular rhythm.     Pulses: Normal pulses.     Heart sounds: Normal heart sounds.  Pulmonary:     Effort: Pulmonary effort is normal. No respiratory distress.     Breath sounds: Normal breath sounds. No stridor. No wheezing, rhonchi or rales.  Chest:     Chest wall: No tenderness.  Abdominal:     General: Abdomen is flat. There is no distension.     Palpations: Abdomen is soft.     Tenderness: There is no abdominal tenderness (Periumbilical). There is no right CVA tenderness, left CVA tenderness, guarding or rebound.     Comments: Patient without any signs of surgical abdomen, no flank pain at this time.  Musculoskeletal:        General: No swelling or tenderness. Normal range of  motion.     Cervical back: Normal range of motion and neck supple. No rigidity.     Right lower leg: No edema.     Left lower leg: No edema.  Skin:    General: Skin is warm and dry.     Capillary Refill: Capillary refill takes less than 2 seconds.     Coloration: Skin is not pale.  Neurological:     General: No focal deficit present.     Mental Status: He is alert and oriented to person, place, and time.  Psychiatric:        Mood and Affect: Mood normal.        Behavior: Behavior normal.     ED Course/Procedures   Clinical Course as of Nov 21 820  Fri Nov 22, 2019  0803 CT ABDOMEN PELVIS WO CONTRAST [SP]    Clinical Course User Index [SP] Farrel Gordon, PA-C    Procedures  MDM   Patient is a 54 year old male coming in with abdominal pain.  No abdominal tenderness when I did exam, patient without any peritoneal signs and normal vitals.  Patient with stable CBC and CMP.  Previous PA communicated that patient needs to get rechecked for creatinine in a week, patient expressed understanding.  500 mL of fluid given today  with creatinine most likely to be due to dehydration, did not want to give more fluid since patient does have a history of CHF.  Previous PA expressed the importance of doing oral fluids.  Patient's nausea vomiting being controlled with Zofran.  Did not vomit for last 6 hours. CT abdomen with no acute findings. Patient tolerating p.o. Patient states that he wants to go home, states that he is very hungry.  Does not feel nauseous anymore.  Denies any more abdominal pain.  He also expressed to me that blood streaking in vomit has decreased in progression.  States that he only had a small spot last time he vomited. No coffee ground emesis. Patient with symptoms most likely due to gastroenteritis.  Discussed urine with patient, patient does not have any dysuria or hematuria.  Will obtain urine culture and have patient follow-up with this.  Patient to be discharged at the  time.Doubt need for further emergent work up at this time. I explained the diagnosis and have given explicit precautions to return to the ER including for any other new or worsening symptoms. The patient understands and accepts the medical plan as it's been dictated and I have answered their questions. Discharge instructions concerning home care and prescriptions have been given. The patient is STABLE and is discharged to home in good condition.       Alfredia Client, PA-C 11/22/19 1610    Dorie Rank, MD 11/23/19 [phone]

## 2019-11-22 NOTE — ED Provider Notes (Signed)
[facility] EMERGENCY DEPARTMENT Provider Note   CSN: [id number] Arrival date & time: 11/21/19  1659     History Chief Complaint  Patient presents with  . Flank Pain    David [name] is a 54 y.o. male.  The history is provided by the patient and medical records.    54 y.o. M with hx of left MCA stroke, cardiomyopathy with EF of 40 to 45%, hypertension, mitral regurgitation, presenting to the ED with abdominal pain.  States he has been having abdominal and back pain for the past week.  States localized to mid abdomen.  Reports associated nausea/vomiting, today started to see some streaks of blood in his emesis.  He denies fever/chills.  No diarrhea.  States he is not sure why he is having so much pain.  No difficulty urinating.  No blood in urine that he is aware of.  Denies chest pain/SOB.  Past Medical History:  Diagnosis Date  . Acute ischemic left MCA stroke (HCC)    a. 08/2019 MRI: Acute L MCA branch vessel infarctions; b. 08/2019 CTA head/neck: Patent bilat carotids/vertebrals. High grade stenosis - inf division of prox L MCA M2 branch. additional high-grade mid-dist superior L MCA M2 branch occlusion. 41mm aneurysm arising from cavernous L ICA.  [name] Cardiomyopathy (HCC)    a. 08/2019 Echo: EF 20-25%, global HK. Gr2 DD. Nl RV fxn. RVSP 30.14mmHg.  Mildly dil LA. Mod MR.  . Cerebral aneurysm    a. 08/2019 CTA Head: 60mm aneurysm arising from the cavernous L ICA.  [name] Facial mass    a. CT head: 7.0 x 2.3 cm soft tissue mass along lat aspect of L temporal bone, involving L ext ear - highly suscpicious for malignancy.  [name] Heart attack (HCC)   . Heart murmur   . Hypertension   . Moderate mitral regurgitation   . Tobacco abuse     Patient Active Problem List   Diagnosis Date Noted  . Cryptogenic stroke (HCC) 11/12/2019  . Chronic systolic CHF (congestive heart failure) (HCC) 08/11/2019  . Mass of left temporal lobe 08/11/2019  . Tobacco abuse 08/10/2019  . Acute  ischemic stroke (HCC) 08/09/2019  . Intussusception of jejunum (HCC) 09/12/2017  . Tailor's bunion of both feet 01/06/2017  . Onychomycosis 01/06/2017  . Foot callus 01/05/2017  . Foreign body in foot 01/05/2017    Past Surgical History:  Procedure Laterality Date  . BUBBLE STUDY  08/12/2019   Procedure: BUBBLE STUDY;  Surgeon: Parke Poisson, MD;  Location: [facility] ENDOSCOPY;  Service: Cardiology;;  . EXTERNAL EAR SURGERY    . TEE WITHOUT CARDIOVERSION N/A 08/12/2019   Procedure: TRANSESOPHAGEAL ECHOCARDIOGRAM (TEE);  Surgeon: Parke Poisson, MD;  Location: [facility] ENDOSCOPY;  Service: Cardiology;  Laterality: N/A;       Family History  Problem Relation Age of Onset  . Cancer Mother        died @ 73  . Other Father        died of CO poisoning as a young man.  . Stroke Sister     Social History   Tobacco Use  . Smoking status: Current Every Day Smoker    Packs/day: 0.50    Years: 35.00    Pack years: 17.50    Types: Cigarettes  . Smokeless tobacco: Never Used  Vaping Use  . Vaping Use: Never used  Substance Use Topics  . Alcohol use: No  . Drug use: Not Currently    Types: Marijuana  Comment: smokes several blunts on the weekend.    Home Medications Prior to Admission medications   Medication Sig Start Date End Date Taking? Authorizing Provider  aspirin EC 81 MG tablet Take 1 tablet (81 mg total) by mouth daily. 11/12/19   Marinus Maw, MD  atorvastatin (LIPITOR) 80 MG tablet TAKE 1 TABLET (80 MG TOTAL) BY MOUTH DAILY AT 6 PM. 10/07/19   Tillery, Mariam Dollar, PA-C  carvedilol (COREG) 6.25 MG tablet Take 1 tablet (6.25 mg total) by mouth 2 (two) times daily. 09/18/19   Graciella Freer, PA-C  ibuprofen (ADVIL,MOTRIN) 600 MG tablet Take 1 tablet (600 mg total) by mouth every 6 (six) hours as needed. 05/13/18   Fawze, Mina A, PA-C  nicotine (NICODERM CQ) 21 mg/24hr patch Place 1 patch (21 mg total) onto the skin daily. 09/12/19   Janeece Agee, NP    sacubitril-valsartan (ENTRESTO) 24-26 MG Take 1 tablet by mouth 2 (two) times daily. 11/05/19   Ronney Asters, NP    Allergies    Patient has no known allergies.  Review of Systems   Review of Systems  Gastrointestinal: Positive for abdominal pain, nausea and vomiting.  All other systems reviewed and are negative.   Physical Exam Updated Vital Signs BP 125/82 (BP Location: Right Arm)   Pulse 86   Temp 98.9 F (37.2 C)   Resp 18   SpO2 99%   Physical Exam Vitals and nursing note reviewed.  Constitutional:      Appearance: He is well-developed.     Comments: Sleeping, awoken for exam  HENT:     Head: Normocephalic and atraumatic.  Eyes:     Conjunctiva/sclera: Conjunctivae normal.     Pupils: Pupils are equal, round, and reactive to light.  Cardiovascular:     Rate and Rhythm: Normal rate and regular rhythm.     Heart sounds: Normal heart sounds.  Pulmonary:     Effort: Pulmonary effort is normal.     Breath sounds: Normal breath sounds.  Abdominal:     General: Bowel sounds are normal.     Palpations: Abdomen is soft.     Tenderness: There is abdominal tenderness in the periumbilical area.     Comments: Soft, minimal periumbilical tenderness, no CVA tenderness, no peritoneal signs  Musculoskeletal:        General: Normal range of motion.     Cervical back: Normal range of motion.  Skin:    General: Skin is warm and dry.  Neurological:     Mental Status: He is alert and oriented to person, place, and time.     ED Results / Procedures / Treatments   Labs (all labs ordered are listed, but only abnormal results are displayed) Labs Reviewed  URINALYSIS, ROUTINE W REFLEX MICROSCOPIC - Abnormal; Notable for the following components:      Result Value   APPearance HAZY (*)    Hgb urine dipstick SMALL (*)    Leukocytes,Ua SMALL (*)    Bacteria, UA FEW (*)    All other components within normal limits  BASIC METABOLIC PANEL - Abnormal; Notable for the following  components:   Glucose, Bld 112 (*)    BUN 30 (*)    Creatinine, Ser 1.86 (*)    GFR calc non Af Amer 40 (*)    GFR calc Af Amer 46 (*)    All other components within normal limits  CBC    EKG None  Radiology No results found.  Procedures  Procedures (including critical care time)  Medications Ordered in ED Medications  iohexol (OMNIPAQUE) 9 MG/ML oral solution (has no administration in time range)  ondansetron (ZOFRAN) injection 4 mg (4 mg Intravenous Given 11/22/19 0455)  sodium chloride 0.9 % bolus 500 mL (0 mLs Intravenous Stopped 11/22/19 0528)    ED Course  I have reviewed the triage vital signs and the nursing notes.  Pertinent labs & imaging results that were available during my care of the patient were reviewed by me and considered in my medical decision making (see chart for details).    MDM Rules/Calculators/A&P  54 year old male presenting to the ED with abdominal pain.  States he has had abdominal and flank pain for the past several days along with nausea and vomiting.  States he started to see some streaks in his emesis yesterday and became concerned.  He does have history of intussusception.  He is afebrile, non-toxic.  he had a prolonged wait in the lobby, no longer vomiting and actually asking for water.  very mild periumbilical tenderness, no CVA tenderness.  Labs as above, normal white count.  He does have a milk AKI with SrCr of 1.86.  BUN elevated at 30, suspect this is likely pre-renal from dehydration and poor oral intake recently.  Patient was given small fluid bolus as he does have history of CHF.  Will obtain CT of the abdomen and pelvis, oral contrast only given AKI today.  6:36 AM CT pending.  Care will be signed out to oncoming provider to follow-up on results and disposition.  If no acute findings and tolerating PO, feel he will be stable for discharge but will need close follow-up with PCP for re-check of SrCr within 1 week.  Final Clinical  Impression(s) / ED Diagnoses Final diagnoses:  Abdominal pain with vomiting    Rx / DC Orders ED Discharge Orders    None       Larene Pickett, PA-C 11/22/19 [place], April, MD 11/22/19 [phone]

## 2019-11-22 NOTE — Discharge Instructions (Signed)
You are seen today for nausea and vomiting.  I want you to follow-up with your primary care, you need to have your creatinine rechecked in the next week.  Come back to the emergency department if you have any new or worsening symptoms.  Stick to a bland diet, use the following guidance attached.

## 2019-11-23 LAB — URINE CULTURE: Culture: NO GROWTH

## 2019-11-26 NOTE — Progress Notes (Signed)
Cardiology Clinic Note   Patient Name: David [name] Date of Encounter: 11/27/2019  Primary Care Provider:  Janeece Agee, NP Primary Cardiologist:  Olga Millers, MD  Patient Profile    David [name] 54 year old male presents today for follow-up evaluation of his chronic systolic CHF and cardiomyopathy.  Past Medical History    Past Medical History:  Diagnosis Date  . Acute ischemic left MCA stroke (HCC)    a. 08/2019 MRI: Acute L MCA branch vessel infarctions; b. 08/2019 CTA head/neck: Patent bilat carotids/vertebrals. High grade stenosis - inf division of prox L MCA M2 branch. additional high-grade mid-dist superior L MCA M2 branch occlusion. 70mm aneurysm arising from cavernous L ICA.  [name] Cardiomyopathy (HCC)    a. 08/2019 Echo: EF 20-25%, global HK. Gr2 DD. Nl RV fxn. RVSP 30.37mmHg.  Mildly dil LA. Mod MR.  . Cerebral aneurysm    a. 08/2019 CTA Head: 49mm aneurysm arising from the cavernous L ICA.  [name] Facial mass    a. CT head: 7.0 x 2.3 cm soft tissue mass along lat aspect of L temporal bone, involving L ext ear - highly suscpicious for malignancy.  [name] Heart attack (HCC)   . Heart murmur   . Hypertension   . Moderate mitral regurgitation   . Tobacco abuse    Past Surgical History:  Procedure Laterality Date  . BUBBLE STUDY  08/12/2019   Procedure: BUBBLE STUDY;  Surgeon: Parke Poisson, MD;  Location: [facility] ENDOSCOPY;  Service: Cardiology;;  . EXTERNAL EAR SURGERY    . TEE WITHOUT CARDIOVERSION N/A 08/12/2019   Procedure: TRANSESOPHAGEAL ECHOCARDIOGRAM (TEE);  Surgeon: Parke Poisson, MD;  Location: [facility] ENDOSCOPY;  Service: Cardiology;  Laterality: N/A;    Allergies  No Known Allergies  History of Present Illness    Mr. Schulenburg has a PMH of acute ischemic CVA, chronic systolic CHF, HTN, chest pain, cardiomyopathy, marijuana use and tobacco abuse.  He was seen by cardiology on 08/11/2019 as a consult for new cardiomyopathy.  He had not seen a healthcare provider in  many years.  He works 68 hours a week and had done this for around 20 years.  His work was somewhat physical and he performed tasks without physical limitations.  On 3/21 he noted chest discomfort that lasted for about 15 minutes and resolve spontaneously.  He mentioned this to his fiance but then did not seek medical attention for further evaluation.  On 08/09/2019 he returned home from work and was noted to have right facial droop and slurred speech.  He was taken by EMS to [facility] ED.  A CT of his head showed no hemorrhage however, subacute infarct in the mid left frontal lobe and left frontal operculum or identified.  He was mildly hypertensive at presentation.  He was also noted to have a 7 x 2.3 cm soft tissue mass along the lateral aspect of his left temporal bone.  This was highly suspicious for malignancy.  He was admitted and underwent CTA of his head and neck which showed high-grade stenosis in the inferior division of the proximal left MCA M2 branch and high-grade stenosis in the mid to distal posterior left MCA M2 branch.  A 7 mm aneurysm arising from his left ICA was also noted.  An MRI showed acute left MCA branch vessel infarctions consistent with embolic stroke.  An echocardiogram 08/10/2019 showed an EF of 20-25% with global hypokinesis and grade 2 diastolic dysfunction.  Moderate mitral valve regurgitation.  He  denied dyspnea on exertion, PND, orthopnea, and lower extremity edema.  During his last cardiology visit 08/14/2019 he denied chest pain and indicated he was feeling slightly better.  EKG showed normal sinus rhythm with PVCs and one run of nonsustained ventricular tachycardia that lasted 15 beats.  TEE 08/12/2019 showed EF 20-25%, global hypokinesis, mildly dilated left atria, and tiny patent foramen ovale.  Cardiac MRI 08/13/2019 showed severely dilated left ventricle with mild concentric hypertrophy and severely decreased systolic function LVEF 20%.  Mild mitral and tricuspid  regurgitation.  Findings suspicious for ischemic cardiomyopathy, ischemic work-up recommended.  Evidence of prethrombotic state there was no evidence of clot/thrombus however it was believed that thrombus may have been cause.  Patient was anticoagulated previously by neurology service.  With a plan to transition losartan to [facility] as outpatient.  He was given LifeVest at discharge with a plan for possible ICD in 3 months.  He presented to the clinic 11/05/19 for follow-up evaluation and stateed he did not feel any different than he did prior to going to the hospital.  He had been wearing his LifeVest as directed.  He did not have any increased shortness of breath and denies chest pain.  I  stopped his losartan  and started him on Entresto.  I ordered BMP checked in 1 week and scheduled follow-up in 3 to 4 weeks for further evaluation.  He was seen in follow-up evaluation by Dr. Ladona Ridgel on 11/12/2019.  During that time he was doing well.  His Eliquis was stopped and he was started on 81 mg aspirin.  His follow-up echocardiogram 11/06/2019 showed an LVEF improvement to 40-45%.  He was instructed to follow-up in 1 year.  He presented to the emergency department on 11/21/2019 with complaints of coughing up/vomiting blood.  His nausea was controlled with Zofran.  CT abdomen showed no acute findings.  It was felt that his symptoms were related to gastroenteritis.  He denied dysuria or hematuria.  He was discharged in stable condition.  His creatinine at the time was 1.86.  He presents to the clinic today and states his Eliquis was stopped by Dr. Ladona Ridgel and he is now taking aspirin.  He had an acute episode of hematemesis and presented to the emergency department.  He has had no more episodes of GI bleeding.  He is back at work and doing 40 hours/week.  He states he has some back pain today which appears to be muscular in nature.  He feels overall he has more energy and is doing well.  I will not increase his Sherryll Burger  today due to his recent bump in creatinine surrounding his acute episode of GI bleeding.  I will follow-up with him virtually in 1 month, and order a BMP today for further evaluation.  Today he denies chest pain, shortness of breath, lower extremity edema, fatigue, palpitations, melena, hematuria, hemoptysis, diaphoresis, weakness, presyncope, syncope, orthopnea, and PND.  Home Medications    Prior to Admission medications   Medication Sig Start Date End Date Taking? Authorizing Provider  aspirin EC 81 MG tablet Take 1 tablet (81 mg total) by mouth daily. 11/12/19   Marinus Maw, MD  atorvastatin (LIPITOR) 80 MG tablet TAKE 1 TABLET (80 MG TOTAL) BY MOUTH DAILY AT 6 PM. 10/07/19   Tillery, Mariam Dollar, PA-C  carvedilol (COREG) 6.25 MG tablet Take 1 tablet (6.25 mg total) by mouth 2 (two) times daily. 09/18/19   Graciella Freer, PA-C  ibuprofen (ADVIL,MOTRIN) 600 MG tablet Take  1 tablet (600 mg total) by mouth every 6 (six) hours as needed. 05/13/18   Fawze, Mina A, PA-C  nicotine (NICODERM CQ) 21 mg/24hr patch Place 1 patch (21 mg total) onto the skin daily. 09/12/19   Janeece Agee, NP  sacubitril-valsartan (ENTRESTO) 24-26 MG Take 1 tablet by mouth 2 (two) times daily. 11/05/19   Ronney Asters, NP    Family History    Family History  Problem Relation Age of Onset  . Cancer Mother        died @ 34  . Other Father        died of CO poisoning as a young man.  . Stroke Sister    He indicated that his mother is deceased. He indicated that his father is deceased. He indicated that his sister is alive.  Social History    Social History   Socioeconomic History  . Marital status: Significant Other    Spouse name: Not on file  . Number of children: 6  . Years of education: Not on file  . Highest education level: Not on file  Occupational History  . Not on file  Tobacco Use  . Smoking status: Current Every Day Smoker    Packs/day: 0.50    Years: 35.00    Pack years: 17.50     Types: Cigarettes  . Smokeless tobacco: Never Used  Vaping Use  . Vaping Use: Never used  Substance and Sexual Activity  . Alcohol use: No  . Drug use: Not Currently    Types: Marijuana    Comment: smokes several blunts on the weekend.  . Sexual activity: Yes  Other Topics Concern  . Not on file  Social History Narrative   Lives in [place] w/ fiancee.  Does not routinely exercise but has very busy work-life, working M-F 12 hrs shifts and another 8 hrs on Saturdays.  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   Social Determinants of Health   Financial Resource Strain:   . Difficulty of Paying Living Expenses:   Food Insecurity:   . Worried About Programme researcher, broadcasting/film/video in the Last Year:   . Barista in the Last Year:   Transportation Needs:   . Freight forwarder (Medical):   [name] Lack of Transportation (Non-Medical):   Physical Activity:   . Days of Exercise per Week:   . Minutes of Exercise per Session:   Stress:   . Feeling of Stress :   Social Connections:   . Frequency of Communication with Friends and Family:   . Frequency of Social Gatherings with Friends and Family:   . Attends Religious Services:   . Active Member of Clubs or Organizations:   . Attends Banker Meetings:   [name] Marital Status:   Intimate Partner Violence:   . Fear of Current or Ex-Partner:   . Emotionally Abused:   [name] Physically Abused:   . Sexually Abused:      Review of Systems    General:  No chills, fever, night sweats or weight changes.  Cardiovascular:  No chest pain, dyspnea on exertion, edema, orthopnea, palpitations, paroxysmal nocturnal dyspnea. Dermatological: No rash, lesions/masses Respiratory: No cough, dyspnea Urologic: No hematuria, dysuria Abdominal:   No nausea, vomiting, diarrhea, bright red blood per rectum, melena, or hematemesis Neurologic:  No visual changes, wkns, changes in mental status. All other systems reviewed and are otherwise  negative except as noted above.  Physical Exam  VS:  BP 128/74   Pulse 83   Temp 98.2 F (36.8 C)   Ht 5\' 11"  (1.803 m)   Wt 171 lb 9.6 oz (77.8 kg)   SpO2 97%   BMI 23.93 kg/m  , BMI Body mass index is 23.93 kg/m. GEN: Well nourished, well developed, in no acute distress. HEENT: normal. Neck: Supple, no JVD, carotid bruits, or masses. Cardiac: RRR, no murmurs, rubs, or gallops. No clubbing, cyanosis, edema.  Radials/DP/PT 2+ and equal bilaterally.  Respiratory:  Respirations regular and unlabored, clear to auscultation bilaterally. GI: Soft, nontender, nondistended, BS + x 4. MS: no deformity or atrophy. Skin: warm and dry, no rash. Neuro:  Strength and sensation are intact. Psych: Normal affect.  Accessory Clinical Findings    ECG personally reviewed by me today-normal sinus rhythm 75 bpm T wave abnormality consider inferior ischemia- No acute changes  EKG 08/09/2019 Sinus rhythm 80 bpm  Cardiac MRI 08/13/2019 IMPRESSION: 1. Severely dilated left ventricle with mild concentric hypertrophy and severely decreased systolic function (LVEF = 20%). There is severe diffuse hypokinesis with akinesis in the basal, mid and apical inferior wall. There is transmural late gadolinium enhancement in the basal, mid and apical inferior walls. There is a heavy smoke/sludge in the left ventricular apex consistent with pre-thrombotic state. Systemic anticoagulation is recommended.  2. Normal right ventricular size, thickness and mildly decreased systolic function (LVEF = 39%). There are no regional wall motion abnormalities.  3. Normal left and right atrial size.  4. Normal size of the aortic root, ascending aorta and pulmonary artery.  5. Mild mitral and tricuspid regurgitation.  6. Normal pericardium. No pericardial effusion.  These findings are suspicious for an ischemic cardiomyopathy, an ischemic workup is recommended.  Echocardiogram 09/04/2019 IMPRESSIONS     1. No LV thombus is seen. Left ventricular ejection fraction, by  estimation, is 20 to 25%. The left ventricle has severely decreased  function. The left ventricle demonstrates global hypokinesis. The left  ventricular internal cavity size was severely  dilated. Left ventricular diastolic parameters are consistent with Grade  II diastolic dysfunction (pseudonormalization). Elevated left atrial  pressure.  2. Right ventricular systolic function is normal. The right ventricular  size is normal. There is normal pulmonary artery systolic pressure. The  estimated right ventricular systolic pressure is 92.3 mmHg.  3. Left atrial size was mildly dilated.  4. The mitral valve is normal in structure. Mild to moderate mitral valve  regurgitation.  5. The aortic valve is normal in structure. Aortic valve regurgitation is  not visualized.  6. The inferior vena cava is normal in size with greater than 50%  respiratory variability, suggesting right atrial pressure of 3 mmHg.   Comparison(s): Prior images reviewed side by side. Changes from prior  study are noted. Although LV systolic function is unchanged, there is less  mitral regurgitation and the signs of high left atrial pressure are  improved.  ECHO TEE 08/12/2019  1. Left ventricular ejection fraction, by estimation, is 20 to 25%. The  left ventricle has severely decreased function. The left ventricle  demonstrates global hypokinesis. The left ventricular internal cavity size  was moderately dilated.  2. Right ventricular systolic function is normal. The right ventricular  size is normal.  3. Left atrial size was mildly dilated. No left atrial/left atrial  appendage thrombus was detected. The LAA emptying velocity was 105 cm/s.  4. The mitral valve is grossly normal. Mild mitral valve regurgitation.  5. The aortic valve is tricuspid.  Aortic valve regurgitation is trivial.  6. There is borderline dilatation of the ascending  aorta measuring 38 mm.  There is mild (Grade II) atheroma plaque involving the transverse and  descending aorta.  7. Evidence of atrial level shunting detected by color flow Doppler.  Agitated saline contrast bubble study was positive with shunting observed  within 3-6 cardiac cycles suggestive of interatrial shunt. There is a tiny  patent foramen ovale.    Echocardiogram 11/06/2019 IMPRESSIONS    1. Left ventricular ejection fraction, by estimation, is 40 to 45%. The  left ventricle has mildly decreased function. The left ventricle  demonstrates global hypokinesis. There is severe hypokinesis of the left  ventricular, entire anteroseptal wall and  inferoseptal wall.The left ventricular internal cavity size was severely  dilated. Left ventricular diastolic parameters are consistent with Grade I  diastolic dysfunction (impaired relaxation). Elevated left ventricular  end-diastolic pressure. The LV  strain was performed but visually does not appear accurate so is not  reported.  2. Right ventricular systolic function is normal. The right ventricular  size is normal. Tricuspid regurgitation signal is inadequate for assessing  PA pressure.  3. The mitral valve is normal in structure. Mild mitral valve  regurgitation. No evidence of mitral stenosis.  4. The aortic valve is tricuspid. Aortic valve regurgitation is not  visualized. Mild aortic valve sclerosis is present, with no evidence of  aortic valve stenosis.  5. Aortic dilatation noted. There is mild dilatation of the ascending  aorta measuring 39 mm.  6. The inferior vena cava is normal in size with greater than 50%  respiratory variability, suggesting right atrial pressure of 3 mmHg.  7. Compared to prior echo 09/04/2019, LVF has improved.   Assessment & Plan   1.  Cardiomyopathy-no increased dyspnea or activity intolerance today.  Echocardiogram 09/04/2019 stable compared to previous LVEF 20-25%, G2 DD, trivial aortic  valve regurgitation, left atrium mildly dilated.  Echocardiogram 11/06/2019 showed an LVEF improvement to 40-45%. Blood pressure today 128/74. Continue atorvastatin, carvedilol, aspirin Continue Entresto  Heart healthy low-sodium diet-salty 6 given Increase physical activity as tolerated Order BMP  Ischemic evaluation plan for after CVA recovery.   Essential hypertension-BP today 128/74 Continue carvedilol Continue Entresto Heart healthy low-sodium diet-salty 6 given Increase physical activity as tolerated  L MCA CVA-TEE showed PFO, no clot/thrombus.  EP consulted for loop recorder placement.  He was not a candidate for loop given his low EF and possible need for ICD.  Left cranial mass-7 x 2.3 cm. Followed by PCP/neurology  Disposition: Follow-up with me in 1 month virtually and Dr. [name] in 3 months.   Thomasene Ripple. Chrishana Spargur NP-C    11/27/2019, 2:46 PM [facility] [address] Office [phone] Fax [phone]

## 2019-11-27 ENCOUNTER — Ambulatory Visit (INDEPENDENT_AMBULATORY_CARE_PROVIDER_SITE_OTHER): Payer: Self-pay | Admitting: General Practice

## 2019-11-27 ENCOUNTER — Other Ambulatory Visit: Payer: Self-pay

## 2019-11-27 ENCOUNTER — Encounter: Payer: Self-pay | Admitting: General Practice

## 2019-11-27 VITALS — BP 128/74 | HR 83 | Temp 98.2°F | Ht 71.0 in | Wt 171.6 lb

## 2019-11-27 DIAGNOSIS — I639 Cerebral infarction, unspecified: Secondary | ICD-10-CM

## 2019-11-27 DIAGNOSIS — I429 Cardiomyopathy, unspecified: Secondary | ICD-10-CM

## 2019-11-27 DIAGNOSIS — Z79899 Other long term (current) drug therapy: Secondary | ICD-10-CM

## 2019-11-27 DIAGNOSIS — I10 Essential (primary) hypertension: Secondary | ICD-10-CM

## 2019-11-27 NOTE — Patient Instructions (Signed)
Medication Instructions:  The current medical regimen is effective;  continue present plan and medications as directed. Please refer to the Current Medication list given to you today. *If you need a refill on your cardiac medications before your next appointment, please call your pharmacy*  Lab Work: BMET TODAY If you have labs (blood work) drawn today and your tests are completely normal, you will receive your results only by:  [place] (if you have MyChart) OR A paper copy in the mail.  If you have any lab test that is abnormal or we need to change your treatment, we will call you to review the results. You may go to any [facility] that is convenient for you however, we do have a lab in our office that is able to assist you. You DO NOT need an appointment for our lab. The lab is open 8:00am and closes at 4:00pm. Lunch 12:45 - 1:45pm.  Special Instructions PLEASE TRY TO FOLLOW ATTACHED MEAL PLANNING  Follow-Up: Your next appointment:  1 month(s)  In Person with Kirk Ruths, MD [place], FNP-C  At [facility], you and your health needs are our priority.  As part of our continuing mission to provide you with exceptional heart care, we have created designated Provider Care Teams.  These Care Teams include your primary Cardiologist (physician) and Advanced Practice Providers (APPs -  Physician Assistants and Nurse Practitioners) who all work together to provide you with the care you need, when you need it.  We recommend signing up for the patient portal called "MyChart".  Sign up information is provided on this After Visit Summary.  MyChart is used to connect with patients for Virtual Visits (Telemedicine).  Patients are able to view lab/test results, encounter notes, upcoming appointments, etc.  Non-urgent messages can be sent to your provider as well.   To learn more about what you can do with MyChart, go to [email or web address].     How do carbohydrates affect  me? Carbohydrates, also called carbs, affect your blood glucose level more than any other type of food. Eating carbs naturally raises the amount of glucose in your blood. Carb counting is a method for keeping track of how many carbs you eat. Counting carbs is important to keep your blood glucose at a healthy level, especially if you use insulin or take certain oral diabetes medicines. It is important to know how many carbs you can safely have in each meal. This is different for every person. Your dietitian can help you calculate how many carbs you should have at each meal and for each snack. Foods that contain carbs include:  Bread, cereal, rice, pasta, and crackers.  Potatoes and corn.  Peas, beans, and lentils.  Milk and yogurt.  Fruit and juice.  Desserts, such as cakes, cookies, ice cream, and candy. How does alcohol affect me? Alcohol can cause a sudden decrease in blood glucose (hypoglycemia), especially if you use insulin or take certain oral diabetes medicines. Hypoglycemia can be a life-threatening condition. Symptoms of hypoglycemia (sleepiness, dizziness, and confusion) are similar to symptoms of having too much alcohol. If your health care provider says that alcohol is safe for you, follow these guidelines:  Limit alcohol intake to no more than 1 drink per day for nonpregnant women and 2 drinks per day for men. One drink equals 12 oz of beer, 5 oz of wine, or 1 oz of hard liquor.  Do not drink on an empty stomach.  Keep yourself hydrated with water,  diet soda, or unsweetened iced tea.  Keep in mind that regular soda, juice, and other mixers may contain a lot of sugar and must be counted as carbs. What are tips for following this plan?  Reading food labels  Start by checking the serving size on the "Nutrition Facts" label of packaged foods and drinks. The amount of calories, carbs, fats, and other nutrients listed on the label is based on one serving of the item. Many items  contain more than one serving per package.  Check the total grams (g) of carbs in one serving. You can calculate the number of servings of carbs in one serving by dividing the total carbs by 15. For example, if a food has 30 g of total carbs, it would be equal to 2 servings of carbs.  Check the number of grams (g) of saturated and trans fats in one serving. Choose foods that have low or no amount of these fats.  Check the number of milligrams (mg) of salt (sodium) in one serving. Most people should limit total sodium intake to less than 2,300 mg per day.  Always check the nutrition information of foods labeled as "low-fat" or "nonfat". These foods may be higher in added sugar or refined carbs and should be avoided.  Talk to your dietitian to identify your daily goals for nutrients listed on the label. Shopping  Avoid buying canned, premade, or processed foods. These foods tend to be high in fat, sodium, and added sugar.  Shop around the outside edge of the grocery store. This includes fresh fruits and vegetables, bulk grains, fresh meats, and fresh dairy. Cooking  Use low-heat cooking methods, such as baking, instead of high-heat cooking methods like deep frying.  Cook using healthy oils, such as olive, canola, or sunflower oil.  Avoid cooking with butter, cream, or high-fat meats. Meal planning  Eat meals and snacks regularly, preferably at the same times every day. Avoid going long periods of time without eating.  Eat foods high in fiber, such as fresh fruits, vegetables, beans, and whole grains. Talk to your dietitian about how many servings of carbs you can eat at each meal.  Eat 4-6 ounces (oz) of lean protein each day, such as lean meat, chicken, fish, eggs, or tofu. One oz of lean protein is equal to: ? 1 oz of meat, chicken, or fish. ? 1 egg. ?  cup of tofu.  Eat some foods each day that contain healthy fats, such as avocado, nuts, seeds, and fish. Lifestyle  Check your  blood glucose regularly.  Exercise regularly as told by your health care provider. This may include: ? 150 minutes of moderate-intensity or vigorous-intensity exercise each week. This could be brisk walking, biking, or water aerobics. ? Stretching and doing strength exercises, such as yoga or weightlifting, at least 2 times a week.  Take medicines as told by your health care provider.  Do not use any products that contain nicotine or tobacco, such as cigarettes and e-cigarettes. If you need help quitting, ask your health care provider.  Work with a Veterinary surgeon or diabetes educator to identify strategies to manage stress and any emotional and social challenges. Questions to ask a health care provider  Do I need to meet with a diabetes educator?  Do I need to meet with a dietitian?  What number can I call if I have questions?  When are the best times to check my blood glucose? Where to find more information:  American Diabetes Association:  diabetes.org  Academy of Nutrition and Dietetics: www.eatright.[employer] of Diabetes and Digestive and Kidney Diseases (NIH): CarFlippers.tn Summary  A healthy meal plan will help you control your blood glucose and maintain a healthy lifestyle.  Working with a diet and nutrition specialist (dietitian) can help you make a meal plan that is best for you.  Keep in mind that carbohydrates (carbs) and alcohol have immediate effects on your blood glucose levels. It is important to count carbs and to use alcohol carefully. This information is not intended to replace advice given to you by your health care provider. Make sure you discuss any questions you have with your health care provider. Document Revised: 05/05/2017 Document Reviewed: 06/27/2016 Elsevier Patient Education  2020 [employer].

## 2019-11-28 ENCOUNTER — Other Ambulatory Visit: Payer: Self-pay

## 2019-11-28 DIAGNOSIS — Z79899 Other long term (current) drug therapy: Secondary | ICD-10-CM

## 2019-11-28 LAB — BASIC METABOLIC PANEL
BUN/Creatinine Ratio: 16 (ref 9–20)
BUN: 21 mg/dL (ref 6–24)
CO2: 27 mmol/L (ref 20–29)
Calcium: 9.6 mg/dL (ref 8.7–10.2)
Chloride: 98 mmol/L (ref 96–106)
Creatinine, Ser: 1.34 mg/dL — ABNORMAL HIGH (ref 0.76–1.27)
GFR calc Af Amer: 68 mL/min/{1.73_m2} (ref >59)
GFR calc non Af Amer: 59 mL/min/{1.73_m2} — ABNORMAL LOW (ref >59)
Glucose: 94 mg/dL (ref 65–99)
Potassium: 5.2 mmol/L (ref 3.5–5.2)
Sodium: 139 mmol/L (ref 134–144)

## 2019-11-28 NOTE — Progress Notes (Unsigned)
Left detailed message as noted for patient (David [name]), call back w/any questions Informed pt to come in the week prior to 01-01-20 appt for non-fasting lab

## 2019-12-02 ENCOUNTER — Other Ambulatory Visit: Payer: Self-pay

## 2019-12-02 ENCOUNTER — Ambulatory Visit (HOSPITAL_COMMUNITY): Payer: Self-pay

## 2019-12-02 ENCOUNTER — Emergency Department (HOSPITAL_COMMUNITY): Payer: Self-pay

## 2019-12-02 ENCOUNTER — Encounter (HOSPITAL_COMMUNITY): Payer: Self-pay | Admitting: Medical

## 2019-12-02 ENCOUNTER — Inpatient Hospital Stay (HOSPITAL_COMMUNITY)
Admission: EM | Admit: 2019-12-02 | Discharge: 2019-12-04 | DRG: 312 | Disposition: A | Discharge status: Home / Self Care | Payer: Worker's Compensation | Attending: Internal Medicine | Admitting: Internal Medicine

## 2019-12-02 DIAGNOSIS — Q211 Atrial septal defect: Secondary | ICD-10-CM

## 2019-12-02 DIAGNOSIS — E861 Hypovolemia: Secondary | ICD-10-CM | POA: Diagnosis present

## 2019-12-02 DIAGNOSIS — Z720 Tobacco use: Secondary | ICD-10-CM

## 2019-12-02 DIAGNOSIS — I951 Orthostatic hypotension: Principal | ICD-10-CM | POA: Diagnosis present

## 2019-12-02 DIAGNOSIS — I639 Cerebral infarction, unspecified: Secondary | ICD-10-CM

## 2019-12-02 DIAGNOSIS — Z8673 Personal history of transient ischemic attack (TIA), and cerebral infarction without residual deficits: Secondary | ICD-10-CM

## 2019-12-02 DIAGNOSIS — S2242XA Multiple fractures of ribs, left side, initial encounter for closed fracture: Secondary | ICD-10-CM | POA: Diagnosis present

## 2019-12-02 DIAGNOSIS — E785 Hyperlipidemia, unspecified: Secondary | ICD-10-CM | POA: Diagnosis present

## 2019-12-02 DIAGNOSIS — F17210 Nicotine dependence, cigarettes, uncomplicated: Secondary | ICD-10-CM | POA: Diagnosis present

## 2019-12-02 DIAGNOSIS — S01312A Laceration without foreign body of left ear, initial encounter: Secondary | ICD-10-CM | POA: Diagnosis present

## 2019-12-02 DIAGNOSIS — Z823 Family history of stroke: Secondary | ICD-10-CM

## 2019-12-02 DIAGNOSIS — E860 Dehydration: Secondary | ICD-10-CM | POA: Diagnosis present

## 2019-12-02 DIAGNOSIS — R55 Syncope and collapse: Secondary | ICD-10-CM

## 2019-12-02 DIAGNOSIS — S0101XA Laceration without foreign body of scalp, initial encounter: Secondary | ICD-10-CM | POA: Diagnosis present

## 2019-12-02 DIAGNOSIS — W19XXXA Unspecified fall, initial encounter: Secondary | ICD-10-CM | POA: Diagnosis present

## 2019-12-02 DIAGNOSIS — I5042 Chronic combined systolic (congestive) and diastolic (congestive) heart failure: Secondary | ICD-10-CM | POA: Diagnosis present

## 2019-12-02 DIAGNOSIS — Z20822 Contact with and (suspected) exposure to covid-19: Secondary | ICD-10-CM | POA: Diagnosis present

## 2019-12-02 DIAGNOSIS — K2970 Gastritis, unspecified, without bleeding: Secondary | ICD-10-CM | POA: Diagnosis present

## 2019-12-02 DIAGNOSIS — I959 Hypotension, unspecified: Secondary | ICD-10-CM | POA: Diagnosis present

## 2019-12-02 DIAGNOSIS — Z79899 Other long term (current) drug therapy: Secondary | ICD-10-CM

## 2019-12-02 DIAGNOSIS — N179 Acute kidney failure, unspecified: Secondary | ICD-10-CM | POA: Diagnosis present

## 2019-12-02 DIAGNOSIS — I428 Other cardiomyopathies: Secondary | ICD-10-CM

## 2019-12-02 DIAGNOSIS — N281 Cyst of kidney, acquired: Secondary | ICD-10-CM | POA: Diagnosis present

## 2019-12-02 DIAGNOSIS — Q2112 Patent foramen ovale: Secondary | ICD-10-CM

## 2019-12-02 DIAGNOSIS — N182 Chronic kidney disease, stage 2 (mild): Secondary | ICD-10-CM | POA: Diagnosis present

## 2019-12-02 DIAGNOSIS — Z7982 Long term (current) use of aspirin: Secondary | ICD-10-CM

## 2019-12-02 HISTORY — DX: Atrial septal defect: Q21.1

## 2019-12-02 HISTORY — DX: Other cardiomyopathies: I42.8

## 2019-12-02 HISTORY — DX: Patent foramen ovale: Q21.12

## 2019-12-02 HISTORY — DX: Heart failure, unspecified: I50.9

## 2019-12-02 HISTORY — DX: Cerebral infarction, unspecified: I63.9

## 2019-12-02 HISTORY — DX: Chronic kidney disease, stage 2 (mild): N18.2

## 2019-12-02 LAB — CBC WITH DIFFERENTIAL/PLATELET
Abs Immature Granulocytes: 0.04 10*3/uL (ref 0.00–0.07)
Basophils Absolute: 0.1 10*3/uL (ref 0.0–0.1)
Basophils Relative: 1 %
Eosinophils Absolute: 0.2 10*3/uL (ref 0.0–0.5)
Eosinophils Relative: 2 %
HCT: 44.9 % (ref 39.0–52.0)
Hemoglobin: 14.8 g/dL (ref 13.0–17.0)
Immature Granulocytes: 0 %
Lymphocytes Relative: 30 %
Lymphs Abs: 2.7 10*3/uL (ref 0.7–4.0)
MCH: 31.6 pg (ref 26.0–34.0)
MCHC: 33 g/dL (ref 30.0–36.0)
MCV: 95.7 fL (ref 80.0–100.0)
Monocytes Absolute: 0.9 10*3/uL (ref 0.1–1.0)
Monocytes Relative: 10 %
Neutro Abs: 5.1 10*3/uL (ref 1.7–7.7)
Neutrophils Relative %: 57 %
Platelets: 230 10*3/uL (ref 150–400)
RBC: 4.69 MIL/uL (ref 4.22–5.81)
RDW: 11.4 % — ABNORMAL LOW (ref 11.5–15.5)
WBC: 9.1 10*3/uL (ref 4.0–10.5)
nRBC: 0 % (ref 0.0–0.2)

## 2019-12-02 LAB — COMPREHENSIVE METABOLIC PANEL
ALT: 21 U/L (ref 0–44)
AST: 30 U/L (ref 15–41)
Albumin: 3.9 g/dL (ref 3.5–5.0)
Alkaline Phosphatase: 83 U/L (ref 38–126)
Anion gap: 11 (ref 5–15)
BUN: 22 mg/dL — ABNORMAL HIGH (ref 6–20)
CO2: 31 mmol/L (ref 22–32)
Calcium: 9.1 mg/dL (ref 8.9–10.3)
Chloride: 94 mmol/L — ABNORMAL LOW (ref 98–111)
Creatinine, Ser: 2.48 mg/dL — ABNORMAL HIGH (ref 0.61–1.24)
GFR calc Af Amer: 33 mL/min — ABNORMAL LOW (ref >60)
GFR calc non Af Amer: 28 mL/min — ABNORMAL LOW (ref >60)
Glucose, Bld: 128 mg/dL — ABNORMAL HIGH (ref 70–99)
Potassium: 5.4 mmol/L — ABNORMAL HIGH (ref 3.5–5.1)
Sodium: 136 mmol/L (ref 135–145)
Total Bilirubin: 1.8 mg/dL — ABNORMAL HIGH (ref 0.3–1.2)
Total Protein: 7.1 g/dL (ref 6.5–8.1)

## 2019-12-02 LAB — PROTIME-INR
INR: 1.1 (ref 0.8–1.2)
Prothrombin Time: 13.4 seconds (ref 11.4–15.2)

## 2019-12-02 LAB — SARS CORONAVIRUS 2 BY RT PCR (HOSPITAL ORDER, PERFORMED IN ~~LOC~~ HOSPITAL LAB): SARS Coronavirus 2: NEGATIVE

## 2019-12-02 LAB — I-STAT CHEM 8, ED
BUN: 26 mg/dL — ABNORMAL HIGH (ref 6–20)
Calcium, Ion: 1 mmol/L — ABNORMAL LOW (ref 1.15–1.40)
Chloride: 95 mmol/L — ABNORMAL LOW (ref 98–111)
Creatinine, Ser: 2.3 mg/dL — ABNORMAL HIGH (ref 0.61–1.24)
Glucose, Bld: 124 mg/dL — ABNORMAL HIGH (ref 70–99)
HCT: 45 % (ref 39.0–52.0)
Hemoglobin: 15.3 g/dL (ref 13.0–17.0)
Potassium: 4.2 mmol/L (ref 3.5–5.1)
Sodium: 137 mmol/L (ref 135–145)
TCO2: 30 mmol/L (ref 22–32)

## 2019-12-02 LAB — LACTIC ACID, PLASMA: Lactic Acid, Venous: 1.7 mmol/L (ref 0.5–1.9)

## 2019-12-02 LAB — TROPONIN I (HIGH SENSITIVITY)
Troponin I (High Sensitivity): 7 ng/L (ref <18)
Troponin I (High Sensitivity): 8 ng/L (ref <18)

## 2019-12-02 MED ORDER — HYDROCODONE-ACETAMINOPHEN 5-325 MG PO TABS
1.0000 | ORAL_TABLET | Freq: Once | ORAL | Status: AC
  Administered 2019-12-02: 1 via ORAL
  Filled 2019-12-02: qty 1

## 2019-12-02 MED ORDER — FENTANYL CITRATE (PF) 100 MCG/2ML IJ SOLN
INTRAMUSCULAR | Status: AC
  Filled 2019-12-02: qty 2

## 2019-12-02 MED ORDER — ACETAMINOPHEN 650 MG RE SUPP: 650.0000 mg | Freq: Four times a day (QID) | RECTAL | Status: DC | PRN

## 2019-12-02 MED ORDER — ONDANSETRON HCL 4 MG/2ML IJ SOLN: 4.0000 mg | Freq: Four times a day (QID) | INTRAMUSCULAR | Status: DC | PRN

## 2019-12-02 MED ORDER — SODIUM CHLORIDE 0.9 % IV SOLN: INTRAVENOUS | Status: DC

## 2019-12-02 MED ORDER — FENTANYL CITRATE (PF) 100 MCG/2ML IJ SOLN
50.0000 ug | Freq: Once | INTRAMUSCULAR | Status: AC
  Administered 2019-12-02: 50 ug via INTRAVENOUS

## 2019-12-02 MED ORDER — ACETAMINOPHEN 325 MG PO TABS
650.0000 mg | ORAL_TABLET | Freq: Four times a day (QID) | ORAL | Status: DC | PRN
  Administered 2019-12-03: 650 mg via ORAL
  Filled 2019-12-02 (×2): qty 2

## 2019-12-02 MED ORDER — ATORVASTATIN CALCIUM 80 MG PO TABS
80.0000 mg | ORAL_TABLET | Freq: Every day | ORAL | Status: DC
  Administered 2019-12-02 – 2019-12-04 (×3): 80 mg via ORAL
  Filled 2019-12-02 (×3): qty 1

## 2019-12-02 MED ORDER — ONDANSETRON HCL 4 MG PO TABS: 4.0000 mg | ORAL_TABLET | Freq: Four times a day (QID) | ORAL | Status: DC | PRN

## 2019-12-02 MED ORDER — PANTOPRAZOLE SODIUM 40 MG PO TBEC
40.0000 mg | DELAYED_RELEASE_TABLET | Freq: Two times a day (BID) | ORAL | Status: DC
  Administered 2019-12-02 – 2019-12-04 (×4): 40 mg via ORAL
  Filled 2019-12-02 (×4): qty 1

## 2019-12-02 MED ORDER — SUCRALFATE 1 GM/10ML PO SUSP
1.0000 g | Freq: Three times a day (TID) | ORAL | Status: DC
  Administered 2019-12-02 – 2019-12-04 (×7): 1 g via ORAL
  Filled 2019-12-02 (×8): qty 10

## 2019-12-02 MED ORDER — ASPIRIN EC 81 MG PO TBEC
81.0000 mg | DELAYED_RELEASE_TABLET | Freq: Two times a day (BID) | ORAL | Status: DC
  Administered 2019-12-02 – 2019-12-04 (×4): 81 mg via ORAL
  Filled 2019-12-02 (×4): qty 1

## 2019-12-02 MED ORDER — HEPARIN SODIUM (PORCINE) 5000 UNIT/ML IJ SOLN
5000.0000 [IU] | Freq: Three times a day (TID) | INTRAMUSCULAR | Status: DC
  Administered 2019-12-02 – 2019-12-04 (×5): 5000 [IU] via SUBCUTANEOUS
  Filled 2019-12-02 (×5): qty 1

## 2019-12-02 MED ORDER — SODIUM CHLORIDE 0.9 % IV BOLUS
2000.0000 mL | Freq: Once | INTRAVENOUS | Status: AC
  Administered 2019-12-02: 2000 mL via INTRAVENOUS

## 2019-12-02 NOTE — ED Notes (Signed)
Taken to CT.

## 2019-12-02 NOTE — Consult Note (Addendum)
Cardiology Consultation:   Patient ID: DEKLAN MINAR; [id number]; 04/12/1966   Admit date: 12/02/2019 Date of Consult: 12/02/2019  Primary Care Provider: Maximiano Coss, NP Primary Cardiologist: Kirk Ruths, MD  Primary Electrophysiologist:  None   Patient Profile:   David [name] is a 54 y.o. male with a PMH of chronic combined CHF (EF improved to 40-45% 11/2019 from 20-25% 08/2019), PFO, CVA 08/2019, CKD stage 2-3, and tobacco abuse, who is being seen today for the evaluation of syncope at the request of Dr. Ralene Bathe.  History of Present Illness:   Mr. Boniface was in his usual state of health until this morning when he experienced an unwitnessed syncopal event at work. EMS was activated and he was found to be diaphoretic on arrival with a laceration to his scalp. He was hypotensive with SBP in the 70s and confused about the surrounding events.   On arrival to the ED BP was 72/50, improved to 90s/50s with IVFs, otherwise vitals were stable. Labs notable for K 4.2, Cr 2.3 (baseline 1.3), Hgb 14.8, PLT 230, HsTrop 7>8, lactate 1.7. EKG with sinus rhythm with rate 72 bpm, chronic inferior TWI and submm ST depressions, no STE. CXR was without acute findings. CT head showed no acute findings. CT A/P showed mildly displaced fractures involving the left 10th-12th ribs and a right renal cyst. He was given 2L IVFs and a dose of fentanyl and norco for pain relief. Cardiology asked to evaluate.  At the time of this evaluation his primary complaint is back pain. He does not recall any pre-syncopal symptoms leading up to his syncopal event. No complaints of chest pain, SOB, dizziness, lightheadedness, or syncope in the events surrounding his syncope. He has been struggling with nausea and vomiting over the past couple weeks. Was seen in the ED for this issue 11/21/19 with negative CT A/P and discharged home after IVFs. He has continued to have multiple episodes of vomiting per day since that time. He has  mild generalized abdominal pain without hematemesis. No complaints of constipation, diarrhea, hematochezia, or melena. He attributes his vomiting to entresto as symptoms started shortly after starting entresto 11/05/19. He denies recent chest pain, SOB, DOE, palpitations, orthopnea, PND, or LE edema. He has cut back on smoking, now only 1 cigarette a day. He denies ETOH and illicit drug use.     Past Medical History:  Diagnosis Date  . CHF (congestive heart failure) ([place])   . CKD stage G2/A2, GFR 60-89 and albumin creatinine ratio 30-299 mg/g   . Nonischemic cardiomyopathy ([place])   . PFO (patent foramen ovale)   . Stroke ([place])   . Tobacco abuse     Past Surgical History:  Procedure Laterality Date  . EXTERNAL EAR SURGERY       Home Medications:  Prior to Admission medications   Medication Sig Start Date End Date Taking? Authorizing Provider  aspirin EC 81 MG tablet Take 81 mg by mouth 2 (two) times daily. Swallow whole.   Yes [provider]  atorvastatin (LIPITOR) 80 MG tablet Take 80 mg by mouth daily. 10/07/19  Yes [provider]  carvedilol (COREG) 6.25 MG tablet Take 6.25 mg by mouth 2 (two) times daily. 10/02/19  Yes [provider]  sacubitril-valsartan (ENTRESTO) 24-26 MG Take 1 tablet by mouth 2 (two) times daily.   Yes [provider]    Inpatient Medications: Scheduled Meds:  Continuous Infusions:  PRN Meds:   Allergies:   No Known Allergies  Social  History:   Social History   Socioeconomic History  . Marital status: Single    Spouse name: Not on file  . Number of children: Not on file  . Years of education: Not on file  . Highest education level: Not on file  Occupational History  . Not on file  Tobacco Use  . Smoking status: Not on file  Substance and Sexual Activity  . Alcohol use: Not on file  . Drug use: Not on file  . Sexual activity: Not on file  Other Topics Concern  . Not on file  Social History Narrative  .  Not on file   Social Determinants of Health   Financial Resource Strain:   . Difficulty of Paying Living Expenses:   Food Insecurity:   . Worried About Programme researcher, broadcasting/film/video in the Last Year:   . Barista in the Last Year:   Transportation Needs:   . Freight forwarder (Medical):   [name] Lack of Transportation (Non-Medical):   Physical Activity:   . Days of Exercise per Week:   . Minutes of Exercise per Session:   Stress:   . Feeling of Stress :   Social Connections:   . Frequency of Communication with Friends and Family:   . Frequency of Social Gatherings with Friends and Family:   . Attends Religious Services:   . Active Member of Clubs or Organizations:   . Attends Banker Meetings:   [name] Marital Status:   Intimate Partner Violence:   . Fear of Current or Ex-Partner:   . Emotionally Abused:   [name] Physically Abused:   . Sexually Abused:     Family History:    Family History  Problem Relation Age of Onset  . Stroke Sister      ROS:  Please see the history of present illness.  ROS  All other ROS reviewed and negative.     Physical Exam/Data:   Vitals:   12/02/19 1335 12/02/19 1345 12/02/19 1350 12/02/19 1523  BP: 100/75 90/62 (!) 92/59 111/62  Pulse: 77 73 70 72  Resp: (!) 23 14 (!) 22 18  Temp:      TempSrc:      SpO2: 100% 99% 98% 99%  Weight:      Height:        Intake/Output Summary (Last 24 hours) at 12/02/2019 1546 Last data filed at 12/02/2019 1129 Gross per 24 hour  Intake 700 ml  Output 0 ml  Net 700 ml   Filed Weights   12/02/19 1105  Weight: 72.6 kg   Body mass index is 22.32 kg/m.  General:  Thin gentleman laying in bed appearing mildly uncomfortable  HEENT: sclera anicteric  Neck: no JVD Vascular: No carotid bruits; distal pulses 2+ bilaterally Cardiac:  normal S1, S2; RRR; no murmurs, rubs, or gallops appreciated Lungs:  clear to auscultation bilaterally anteriorly, no wheezing, rhonchi or rales;  Abd: NABS, soft,  nontender, no hepatomegaly Ext: no edema Musculoskeletal:  No deformities, BUE and BLE strength normal and equal Skin: warm and dry  Neuro:  CNs 2-12 intact, no focal abnormalities noted Psych:  Normal affect   EKG:  The EKG was personally reviewed and demonstrates:  sinus rhythm with rate 72 bpm, chronic inferior TWI and submm ST depressions, no STE Telemetry:  Telemetry was personally reviewed and demonstrates:  Sinus rhythm.   Relevant CV Studies: Echocardiogram 08/2019: 1. Left ventricular ejection fraction, by estimation, is 20 to 25%. The  left ventricle has severely decreased function. The left ventricle  demonstrates global hypokinesis. The left ventricular internal cavity size  was moderately dilated. Left  ventricular diastolic parameters are consistent with Grade II diastolic  dysfunction (pseudonormalization). Elevated left atrial pressure.  2. Right ventricular systolic function is normal. The right ventricular  size is normal. There is mildly elevated pulmonary artery systolic  pressure. The estimated right ventricular systolic pressure is 30.2 mmHg.  3. Left atrial size was mildly dilated.  4. The mitral valve is normal in structure and function. Moderate mitral  valve regurgitation.  5. The aortic valve is normal in structure and function. Aortic valve  regurgitation is not visualized.   Coronary CTA 08/2019: 1. Coronary calcium score of 0. This was 0 percentile for age and sex matched control.  2. Normal coronary origin with right dominance.  3. CAD-RADS 0. No evidence of CAD (0%). Consider non-atherosclerotic causes of chest pain.  Echocardiogram 11/06/2019: 1. Left ventricular ejection fraction, by estimation, is 40 to 45%. The  left ventricle has mildly decreased function. The left ventricle  demonstrates global hypokinesis. There is severe hypokinesis of the left  ventricular, entire anteroseptal wall and  inferoseptal wall.The left ventricular internal  cavity size was severely  dilated. Left ventricular diastolic parameters are consistent with Grade I  diastolic dysfunction (impaired relaxation). Elevated left ventricular  end-diastolic pressure. The LV  strain was performed but visually does not appear accurate so is not  reported.  2. Right ventricular systolic function is normal. The right ventricular  size is normal. Tricuspid regurgitation signal is inadequate for assessing  PA pressure.  3. The mitral valve is normal in structure. Mild mitral valve  regurgitation. No evidence of mitral stenosis.  4. The aortic valve is tricuspid. Aortic valve regurgitation is not  visualized. Mild aortic valve sclerosis is present, with no evidence of  aortic valve stenosis.  5. Aortic dilatation noted. There is mild dilatation of the ascending  aorta measuring 39 mm.  6. The inferior vena cava is normal in size with greater than 50%  respiratory variability, suggesting right atrial pressure of 3 mmHg.  7. Compared to prior echo 09/04/2019, LVF has improved.   Laboratory Data:  Chemistry Recent Labs  Lab 12/02/19 1113 12/02/19 1115  NA 136 137  K 5.4* 4.2  CL 94* 95*  CO2 31  --   GLUCOSE 128* 124*  BUN 22* 26*  CREATININE 2.48* 2.30*  CALCIUM 9.1  --   GFRNONAA 28*  --   GFRAA 33*  --   ANIONGAP 11  --     Recent Labs  Lab 12/02/19 1113  PROT 7.1  ALBUMIN 3.9  AST 30  ALT 21  ALKPHOS 83  BILITOT 1.8*   Hematology Recent Labs  Lab 12/02/19 1113 12/02/19 1115  WBC 9.1  --   RBC 4.69  --   HGB 14.8 15.3  HCT 44.9 45.0  MCV 95.7  --   MCH 31.6  --   MCHC 33.0  --   RDW 11.4*  --   PLT 230  --    Cardiac EnzymesNo results for input(s): TROPONINI in the last 168 hours. No results for input(s): TROPIPOC in the last 168 hours.  BNPNo results for input(s): BNP, PROBNP in the last 168 hours.  DDimer No results for input(s): DDIMER in the last 168 hours.  Radiology/Studies:  CT Abdomen Pelvis Wo  Contrast  Result Date: 12/02/2019 CLINICAL DATA:  Unwitnessed fall. EXAM: CT ABDOMEN AND  PELVIS WITHOUT CONTRAST TECHNIQUE: Multidetector CT imaging of the abdomen and pelvis was performed following the standard protocol without IV contrast. COMPARISON:  November 22, 2019.  January 02, 2018. FINDINGS: Lower chest: Minimal bilateral posterior basilar subsegmental atelectasis is noted. Hepatobiliary: No focal liver abnormality is seen. No gallstones, gallbladder wall thickening, or biliary dilatation. Pancreas: Unremarkable. No pancreatic ductal dilatation or surrounding inflammatory changes. Spleen: Normal in size without focal abnormality. Adrenals/Urinary Tract: Adrenal glands appear normal. No renal or ureteral calculi are noted. No hydronephrosis or renal obstruction is noted. Urinary bladder is unremarkable. 1.6 cm rounded echogenic focus is noted in lower pole of right kidney which corresponds in location to cyst seen on prior exam of 2019. Stomach/Bowel: Stomach is within normal limits. Appendix appears normal. No evidence of bowel wall thickening, distention, or inflammatory changes. Vascular/Lymphatic: No significant vascular findings are present. No enlarged abdominal or pelvic lymph nodes. Reproductive: Prostate is unremarkable. Other: No abdominal wall hernia or abnormality. No abdominopelvic ascites. Musculoskeletal: Mildly displaced fractures are seen involving the left tenth, eleventh and twelfth ribs. IMPRESSION: 1. Mildly displaced fractures are seen involving the left tenth, eleventh and twelfth ribs. 2. 1.6 cm rounded echogenic focus is noted in lower pole of right kidney which corresponds in location to cyst seen on prior exam of 2019. This most likely represents hyperdense cyst, but renal ultrasound is recommended for confirmation and to rule out mass. Electronically Signed   By: Lupita Raider M.D.   On: 12/02/2019 13:25   CT Head Wo Contrast  Result Date: 12/02/2019 CLINICAL DATA:  Syncope,  hypotension, altered mental status. Additional provided: Unwitnessed fall, positive loss of consciousness, back pain. EXAM: CT HEAD WITHOUT CONTRAST TECHNIQUE: Contiguous axial images were obtained from the base of the skull through the vertex without intravenous contrast. COMPARISON:  Brain MRI 08/10/2019, CT angiogram head/neck and non-contrast CT head 08/09/2019 FINDINGS: Brain: Redemonstrated, now chronic, infarct within the mid left frontal lobe and left frontal operculum. There is no acute intracranial hemorrhage. No acute demarcated cortical infarct is identified. No extra-axial fluid collection. No evidence of intracranial mass. No midline shift. Vascular: No hyperdense vessel. Skull: Normal. Negative for fracture or focal lesion. Sinuses/Orbits: Visualized orbits show no acute finding. Complete opacification of the left maxillary sinus at the imaged levels consistent with known chronic left maxillary sinusitis. Otherwise mild scattered paranasal sinus mucosal thickening, most notably ethmoid. No significant mastoid effusion. Other: Interval decrease in size of a partially calcified cutaneous/subcutaneous mass along the lateral aspect of the left temporal bone and involving the external ear, now measuring 4.9 x 1.8 cm in transaxial dimensions. IMPRESSION: No CT evidence of acute intracranial abnormality. Redemonstrated cortically based infarct within the mid left frontal lobe and left frontal operculum, now chronic. Paranasal sinus disease as described, most notably left maxillary. Interval decrease in size of a partially calcified cutaneous/subcutaneous mass along the lateral aspect of the left temporal bone and involving the left external ear, now 4.9 x 1.8 cm. Clinical correlation is recommended. Electronically Signed   By: Jackey Loge DO   On: 12/02/2019 13:27   DG Chest Portable 1 View  Result Date: 12/02/2019 CLINICAL DATA:  54 year old male with history of trauma from a fall. Hypotension. EXAM:  PORTABLE CHEST 1 VIEW COMPARISON:  No priors. FINDINGS: Lung volumes are normal. No consolidative airspace disease. No pleural effusions. No pneumothorax. No pulmonary nodule or mass noted. Pulmonary vasculature and the cardiomediastinal silhouette are within normal limits. IMPRESSION: No radiographic evidence of acute cardiopulmonary  disease. Electronically Signed   By: Trudie Reed M.D.   On: 12/02/2019 11:19    Assessment and Plan:   1. Syncope: unclear etiology, though likely hypotension contributed to his syncope as BP was in the 70s/50s on arrival. Cr is also up, suggesting dehydration played a roll, likely from N/V.  - Favor holding home carvedilol and entresto for now - restart as BP improves  2. Chronic combined CHF/non-ischemic etiology: EF improved from 20-25% 08/2019 to 40-45% 11/2019. No complaints to suggest volume overload and he appears euvolemic on exam.  - Hold home carvedilol and entresto given hypotension - restart as tolerated. - Monitor volume status closely with IVF administration.    3. Acute on chronic renal insufficiency stage 2: Cr 2.3 this admission, up from 1.3 11/27/19. Likely 2/2 dehydration from N/V. Now s/p 2L IVFs - Continue to monitor closely   4. Rib fractures: noted to have mildly displaced left 10th-12th ribs. S/p a dose of fentanyl and norco in the ED.  - Continue pain management per primary team  5. N/V: unclear etiology. Possible this is 2/2 medications. CT A/P without acute findings to explain symptoms.  - Will defer further work-up/management to primary team.   6. CVA: hx of CVA 08/2019. Has known PFO. CT Head this admission without acute findings.    For questions or updates, please contact [facility] Please consult [email or web address] for contact info under Cardiology/STEMI.   Signed, Beatriz Stallion, PA-C  12/02/2019 3:46 PM [phone] As above, patient seen and examined.  Briefly he is a 54 year old male with past medical history of  chronic combined systolic/diastolic congestive heart failure, prior CVA, chronic stage II-III kidney disease for evaluation of syncope.  Patient recently admitted with CVA and echocardiogram showed severely reduced LV function, moderate mitral regurgitation and mild left atrial enlargement.  Cardiac CTA revealed calcium score of 0 and no coronary disease.  Patient treated medically and follow-up echocardiogram June 2021 showed ejection fraction 40 to 45%, mild mitral regurgitation.  Patient has had nausea and vomiting for the past 2 to 3 days.  He denies dyspnea, chest pain, palpitations or diarrhea.  No fevers or chills.  He was at work and stood to walk towards Engineer, water.  He had sudden syncope.  He was unconscious for a brief amount of time but he cannot be specific.  He was brought to the emergency room and cardiology asked to evaluate.  Note patient does describe some increased dizziness with standing recently.  CTA showed renal lesion and rib fractures.  Initial blood pressure 91/53.  Laboratories show BUN 26 and creatinine 2.30 which is new.  Electrocardiogram shows sinus rhythm with inferior T wave inversion.  1 syncope-this appears to be orthostatic in etiology.  Patient has had problems with nausea and vomiting for the last several days and has had dizziness with standing as well.  He was also hypotensive at time of evaluation.  Patient also noted to have elevated BUN to creatinine ratio suggestive of dehydration.  I agree with hydration.  Would hold his Entresto and carvedilol for now.  We will likely resume at lower dose as he improves.  2 cardiomyopathy-nonischemic in etiology.  Recent coronary CTA showed no coronary disease.  Will resume either Entresto or ARB later as well as carvedilol.  3 acute on chronic stage II kidney disease-creatinine elevated compared to previous likely secondary to dehydration.  Will hold Entresto.  Gently hydrate.  4 history of CVA.  Olga Millers, MD

## 2019-12-02 NOTE — ED Provider Notes (Signed)
[facility] EMERGENCY DEPARTMENT Provider Note   CSN: [id number] Arrival date & time: 12/02/19  1056     History Chief Complaint  Patient presents with  . Fall    Hendrick J Ploeger is a 54 y.o. male.  The history is provided by the patient, the EMS personnel and medical records. No language interpreter was used.  Fall   Jaliel J Moeller is a 54 y.o. male who presents to the Emergency Department complaining of syncope. Level V caveat due to confusion. He presents the emergency department by EMS following a syncopal event at work. There were no witnesses and it is unclear what occurred. On EMS arrival he was found sitting in a chair, diaphoretic with a laceration to his scalp. He was noted to be hypotensive with the blood pressure in the 70s. Patient is confused and is unsure what happened earlier. He reports left-sided low back pain, no additional complaints.    Past Medical History:  Diagnosis Date  . CHF (congestive heart failure) (HCC)   . CKD stage G2/A2, GFR 60-89 and albumin creatinine ratio 30-299 mg/g   . Nonischemic cardiomyopathy (HCC)   . PFO (patent foramen ovale)   . Stroke (HCC)   . Tobacco abuse     Patient Active Problem List   Diagnosis Date Noted  . Chronic combined systolic and diastolic CHF (congestive heart failure) (HCC) 12/02/2019  . Nonischemic cardiomyopathy (HCC) 12/02/2019  . CVA (cerebral vascular accident) (HCC) 12/02/2019  . PFO (patent foramen ovale) 12/02/2019  . CKD stage G2/A2, GFR 60-89 and albumin creatinine ratio 30-299 mg/g 12/02/2019  . Tobacco abuse 12/02/2019       Family History  Problem Relation Age of Onset  . Stroke Sister     Social History   Tobacco Use  . Smoking status: Not on file  Substance Use Topics  . Alcohol use: Not on file  . Drug use: Not on file    Home Medications Prior to Admission medications   Medication Sig Start Date End Date Taking? Authorizing Provider  aspirin EC 81 MG  tablet Take 81 mg by mouth 2 (two) times daily. Swallow whole.   Yes [provider]  atorvastatin (LIPITOR) 80 MG tablet Take 80 mg by mouth daily. 10/07/19  Yes [provider]  carvedilol (COREG) 6.25 MG tablet Take 6.25 mg by mouth 2 (two) times daily. 10/02/19  Yes [provider]  sacubitril-valsartan (ENTRESTO) 24-26 MG Take 1 tablet by mouth 2 (two) times daily.   Yes [provider]    Allergies    Patient has no known allergies.  Review of Systems   Review of Systems  All other systems reviewed and are negative.   Physical Exam Updated Vital Signs BP 111/62   Pulse 72   Temp 97.6 F (36.4 C) (Oral)   Resp 18   Ht 5\' 11"  (1.803 m)   Wt 72.6 kg   SpO2 99%   BMI 22.32 kg/m   Physical Exam Vitals and nursing note reviewed.  Constitutional:      General: He is in acute distress.     Appearance: He is well-developed. He is ill-appearing and diaphoretic.  HENT:     Head: Normocephalic.     Comments: Small laceration to the left lateral scalp.  there is scarring to the left ear. Cardiovascular:     Rate and Rhythm: Normal rate and regular rhythm.     Heart sounds: No murmur heard.   Pulmonary:  Effort: Pulmonary effort is normal. No respiratory distress.     Breath sounds: Normal breath sounds.  Abdominal:     Palpations: Abdomen is soft.     Tenderness: There is no abdominal tenderness. There is no guarding or rebound.  Musculoskeletal:        General: No swelling or tenderness.  Skin:    General: Skin is warm.  Neurological:     Mental Status: He is alert and oriented to person, place, and time.  Psychiatric:        Behavior: Behavior normal.     ED Results / Procedures / Treatments   Labs (all labs ordered are listed, but only abnormal results are displayed) Labs Reviewed  CBC WITH DIFFERENTIAL/PLATELET - Abnormal; Notable for the following components:      Result Value   RDW 11.4 (*)    All other components  within normal limits  COMPREHENSIVE METABOLIC PANEL - Abnormal; Notable for the following components:   Potassium 5.4 (*)    Chloride 94 (*)    Glucose, Bld 128 (*)    BUN 22 (*)    Creatinine, Ser 2.48 (*)    Total Bilirubin 1.8 (*)    GFR calc non Af Amer 28 (*)    GFR calc Af Amer 33 (*)    All other components within normal limits  I-STAT CHEM 8, ED - Abnormal; Notable for the following components:   Chloride 95 (*)    BUN 26 (*)    Creatinine, Ser 2.30 (*)    Glucose, Bld 124 (*)    Calcium, Ion 1.00 (*)    All other components within normal limits  SARS CORONAVIRUS 2 BY RT PCR (HOSPITAL ORDER, PERFORMED IN [facility])  PROTIME-INR  LACTIC ACID, PLASMA  TROPONIN I (HIGH SENSITIVITY)  TROPONIN I (HIGH SENSITIVITY)    EKG None  Radiology CT Abdomen Pelvis Wo Contrast  Result Date: 12/02/2019 CLINICAL DATA:  Unwitnessed fall. EXAM: CT ABDOMEN AND PELVIS WITHOUT CONTRAST TECHNIQUE: Multidetector CT imaging of the abdomen and pelvis was performed following the standard protocol without IV contrast. COMPARISON:  November 22, 2019.  January 02, 2018. FINDINGS: Lower chest: Minimal bilateral posterior basilar subsegmental atelectasis is noted. Hepatobiliary: No focal liver abnormality is seen. No gallstones, gallbladder wall thickening, or biliary dilatation. Pancreas: Unremarkable. No pancreatic ductal dilatation or surrounding inflammatory changes. Spleen: Normal in size without focal abnormality. Adrenals/Urinary Tract: Adrenal glands appear normal. No renal or ureteral calculi are noted. No hydronephrosis or renal obstruction is noted. Urinary bladder is unremarkable. 1.6 cm rounded echogenic focus is noted in lower pole of right kidney which corresponds in location to cyst seen on prior exam of 2019. Stomach/Bowel: Stomach is within normal limits. Appendix appears normal. No evidence of bowel wall thickening, distention, or inflammatory changes. Vascular/Lymphatic: No  significant vascular findings are present. No enlarged abdominal or pelvic lymph nodes. Reproductive: Prostate is unremarkable. Other: No abdominal wall hernia or abnormality. No abdominopelvic ascites. Musculoskeletal: Mildly displaced fractures are seen involving the left tenth, eleventh and twelfth ribs. IMPRESSION: 1. Mildly displaced fractures are seen involving the left tenth, eleventh and twelfth ribs. 2. 1.6 cm rounded echogenic focus is noted in lower pole of right kidney which corresponds in location to cyst seen on prior exam of 2019. This most likely represents hyperdense cyst, but renal ultrasound is recommended for confirmation and to rule out mass. Electronically Signed   By: Lupita Raider M.D.   On: 12/02/2019 13:25  CT Head Wo Contrast  Result Date: 12/02/2019 CLINICAL DATA:  Syncope, hypotension, altered mental status. Additional provided: Unwitnessed fall, positive loss of consciousness, back pain. EXAM: CT HEAD WITHOUT CONTRAST TECHNIQUE: Contiguous axial images were obtained from the base of the skull through the vertex without intravenous contrast. COMPARISON:  Brain MRI 08/10/2019, CT angiogram head/neck and non-contrast CT head 08/09/2019 FINDINGS: Brain: Redemonstrated, now chronic, infarct within the mid left frontal lobe and left frontal operculum. There is no acute intracranial hemorrhage. No acute demarcated cortical infarct is identified. No extra-axial fluid collection. No evidence of intracranial mass. No midline shift. Vascular: No hyperdense vessel. Skull: Normal. Negative for fracture or focal lesion. Sinuses/Orbits: Visualized orbits show no acute finding. Complete opacification of the left maxillary sinus at the imaged levels consistent with known chronic left maxillary sinusitis. Otherwise mild scattered paranasal sinus mucosal thickening, most notably ethmoid. No significant mastoid effusion. Other: Interval decrease in size of a partially calcified  cutaneous/subcutaneous mass along the lateral aspect of the left temporal bone and involving the external ear, now measuring 4.9 x 1.8 cm in transaxial dimensions. IMPRESSION: No CT evidence of acute intracranial abnormality. Redemonstrated cortically based infarct within the mid left frontal lobe and left frontal operculum, now chronic. Paranasal sinus disease as described, most notably left maxillary. Interval decrease in size of a partially calcified cutaneous/subcutaneous mass along the lateral aspect of the left temporal bone and involving the left external ear, now 4.9 x 1.8 cm. Clinical correlation is recommended. Electronically Signed   By: Jackey Loge DO   On: 12/02/2019 13:27   DG Chest Portable 1 View  Result Date: 12/02/2019 CLINICAL DATA:  54 year old male with history of trauma from a fall. Hypotension. EXAM: PORTABLE CHEST 1 VIEW COMPARISON:  No priors. FINDINGS: Lung volumes are normal. No consolidative airspace disease. No pleural effusions. No pneumothorax. No pulmonary nodule or mass noted. Pulmonary vasculature and the cardiomediastinal silhouette are within normal limits. IMPRESSION: No radiographic evidence of acute cardiopulmonary disease. Electronically Signed   By: Trudie Reed M.D.   On: 12/02/2019 11:19    Procedures Procedures (including critical care time) CRITICAL CARE Performed by: Tilden Fossa   Total critical care time: 35 minutes  Critical care time was exclusive of separately billable procedures and treating other patients.  Critical care was necessary to treat or prevent imminent or life-threatening deterioration.  Critical care was time spent personally by me on the following activities: development of treatment plan with patient and/or surrogate as well as nursing, discussions with consultants, evaluation of patient's response to treatment, examination of patient, obtaining history from patient or surrogate, ordering and performing treatments and  interventions, ordering and review of laboratory studies, ordering and review of radiographic studies, pulse oximetry and re-evaluation of patient's condition.  Medications Ordered in ED Medications  sodium chloride 0.9 % bolus 2,000 mL (0 mLs Intravenous Stopped 12/02/19 1230)  fentaNYL (SUBLIMAZE) injection 50 mcg (50 mcg Intravenous Given 12/02/19 1125)  HYDROcodone-acetaminophen (NORCO/VICODIN) 5-325 MG per tablet 1 tablet (1 tablet Oral Given 12/02/19 1519)    ED Course  I have reviewed the triage vital signs and the nursing notes.  Pertinent labs & imaging results that were available during my care of the patient were reviewed by me and considered in my medical decision making (see chart for details).    MDM Rules/Calculators/A&P                         patient presented to  the emergency department as a level I trauma alert following syncopal event with head injury. On ED arrival patient is hypotensive with isolated superficial abrasion to the left scalp. Trauma alert was canceled as this appears to be secondary to a medical illness. He was treated with IV fluid resuscitation with improvement in his blood pressure. Labs are significant for acute kidney injury. Concern that hypotension is a side effect of medication resulting in syncope and AKI. Imaging does demonstrate left-sided rib fractures. Cardiology consulted for admission given his symptoms. Patient updated findings of studies recommendation for admission and he is in agreement with treatment plan.  Final Clinical Impression(s) / ED Diagnoses Final diagnoses:  Vasovagal syncope  AKI (acute kidney injury) [facility])    Rx / DC Orders ED Discharge Orders    None       Tilden Fossa, MD 12/02/19 1546

## 2019-12-02 NOTE — ED Triage Notes (Signed)
Patient arrived via EMS ; s/p unwitnessed fall. +LOC; Reported patient was recently on blood thinners. Hypotensive, patient is awake and answering appropriately on arrival to ED.

## 2019-12-02 NOTE — Progress Notes (Signed)
Orthopedic Tech Progress Note Patient Details:  David [name] [birth year] [id number] Level 1 trauma downgraded  Patient ID: David [name], male   DOB: 13-Jan-1966, 54 y.o.   MRN: [id number]   David [name] 12/02/2019, 11:21 AM

## 2019-12-02 NOTE — ED Provider Notes (Signed)
Signout note  Brief summary: 54 year old male syncope. David [name] is a 54 y.o. male with a PMH of chronic combined CHF (EF improved to 40-45% 11/2019 from 20-25% 08/2019), PFO, CVA 08/2019, CKD stage 2-3, and tobacco abuse.   Trauma work up noted nondisplaced rib fx, otherwise neg. Labs concerning for AKI. BP initially low, resolved with fluids.   3:30 PM received sign out, f/u cards recs  5:10 PM paged cards attending, d/w Hilty - she recommends medicine admission, hold home carvedilol and entresto for now - restart as BP improves, monitor volume status closely  5:22 PM paged hospitalist for admission   Milagros Loll, MD 12/02/19 1723

## 2019-12-02 NOTE — H&P (Signed)
History and Physical    David [name] QIW:[id number] DOB: 12/10/1965 DOA: 12/02/2019  PCP: Maximiano Coss, NP   Patient coming from: home   Chief Complaint: syncope episode  HPI: David [name] is a 54 y.o. male with medical history significant of systolic heart failure with nonischemic cardiomyopathy, chronic kidney disease stage IIa, CVA and tobacco abuse.   Patient presented after experiencing a syncope episode.  He was standing, moving boxes with his upper extremities, the boxes had medium weight.  While doing this he lost his consciousness, he does not recall any prodromal symptoms.  He collapsed to the floor, EMS was called and he was found diaphoretic and hypotensive with a blood pressure in the 41D systolic.  Positive laceration in his left ear.  He has been experiencing nausea and vomiting for the last 2 to 3 days, unable to fully tolerate p.o. intake.   There is a reported his antihypertensive agents were recently uptitrated at the outpatient clinic.  ED Course: Patient was found hypotensive, he received 2 L of isotonic saline along with 50 mcg of fentanyl and 1 tablet of Vicodin.  He was referred for admission for evaluation.  Review of Systems:  1. General: No fevers, no chills, no weight gain or weight loss.  Several days of feeling weak, generalized.  Poor p.o. intake, no appetite. 2. ENT: No runny nose or sore throat, no hearing disturbances 3. Pulmonary: No dyspnea, cough, wheezing, or hemoptysis 4. Cardiovascular: No angina, claudication, lower extremity edema, pnd or orthopnea 5. Gastrointestinal: Positive, nausea and vomiting as mentioned in history of present illness, no diarrhea or constipation 6. Hematology: No easy bruisability or frequent infections 7. Urology: No dysuria, hematuria or increased urinary frequency 8. Dermatology: No rashes. 9. Neurology: No seizures or paresthesias 10. Musculoskeletal: No joint pain or deformities  Past Medical History:    Diagnosis Date  . CHF (congestive heart failure) ([place])   . CKD stage G2/A2, GFR 60-89 and albumin creatinine ratio 30-299 mg/g   . Nonischemic cardiomyopathy ([place])   . PFO (patent foramen ovale)   . Stroke ([place])   . Tobacco abuse     Past Surgical History:  Procedure Laterality Date  . EXTERNAL EAR SURGERY       has no history on file for tobacco use, alcohol use, and drug use.  No Known Allergies  Family History  Problem Relation Age of Onset  . Stroke Sister      Prior to Admission medications   Medication Sig Start Date End Date Taking? Authorizing Provider  aspirin EC 81 MG tablet Take 81 mg by mouth 2 (two) times daily. Swallow whole.   Yes [provider]  atorvastatin (LIPITOR) 80 MG tablet Take 80 mg by mouth daily. 10/07/19  Yes [provider]  carvedilol (COREG) 6.25 MG tablet Take 6.25 mg by mouth 2 (two) times daily. 10/02/19  Yes [provider]  sacubitril-valsartan (ENTRESTO) 24-26 MG Take 1 tablet by mouth 2 (two) times daily.   Yes [provider]    Physical Exam: Vitals:   12/02/19 1350 12/02/19 1523 12/02/19 1545 12/02/19 1645  BP: (!) 92/59 111/62 (!) 98/58 (!) 111/55  Pulse: 70 72 65 66  Resp: (!) 22 18  13   Temp:      TempSrc:      SpO2: 98% 99% 98% 98%  Weight:      Height:        Vitals:   12/02/19 1350 12/02/19 1523 12/02/19 1545 12/02/19  1645  BP: (!) 92/59 111/62 (!) 98/58 (!) 111/55  Pulse: 70 72 65 66  Resp: (!) 22 18  13   Temp:      TempSrc:      SpO2: 98% 99% 98% 98%  Weight:      Height:       General: deconditioned and ill looking appearing  Neurology: Awake and alert, non focal Head and Neck. Head normocephalic. Neck supple with no adenopathy or thyromegaly.   E ENT: positive  pallor, no icterus, oral mucosa dry Cardiovascular: No JVD. S1-S2 present, rhythmic, no gallops, rubs, or murmurs. No lower extremity edema. Pulmonary: positive breath sounds bilaterally, adequate air movement, no  wheezing, rhonchi or rales. Gastrointestinal. Abdomen soft no organomegaly, non tender, no rebound or guarding Skin. Left ear laceration. Dressing in place.  Musculoskeletal: no joint deformities    Labs on Admission: I have personally reviewed following labs and imaging studies  CBC: Recent Labs  Lab 12/02/19 1113 12/02/19 1115  WBC 9.1  --   NEUTROABS 5.1  --   HGB 14.8 15.3  HCT 44.9 45.0  MCV 95.7  --   PLT 230  --    Basic Metabolic Panel: Recent Labs  Lab 12/02/19 1113 12/02/19 1115  NA 136 137  K 5.4* 4.2  CL 94* 95*  CO2 31  --   GLUCOSE 128* 124*  BUN 22* 26*  CREATININE 2.48* 2.30*  CALCIUM 9.1  --    GFR: Estimated Creatinine Clearance: 37.3 mL/min (A) (by C-G formula based on SCr of 2.3 mg/dL (H)). Liver Function Tests: Recent Labs  Lab 12/02/19 1113  AST 30  ALT 21  ALKPHOS 83  BILITOT 1.8*  PROT 7.1  ALBUMIN 3.9   No results for input(s): LIPASE, AMYLASE in the last 168 hours. No results for input(s): AMMONIA in the last 168 hours. Coagulation Profile: Recent Labs  Lab 12/02/19 1113  INR 1.1   Cardiac Enzymes: No results for input(s): CKTOTAL, CKMB, CKMBINDEX, TROPONINI in the last 168 hours. BNP (last 3 results) No results for input(s): PROBNP in the last 8760 hours. HbA1C: No results for input(s): HGBA1C in the last 72 hours. CBG: No results for input(s): GLUCAP in the last 168 hours. Lipid Profile: No results for input(s): CHOL, HDL, LDLCALC, TRIG, CHOLHDL, LDLDIRECT in the last 72 hours. Thyroid Function Tests: No results for input(s): TSH, T4TOTAL, FREET4, T3FREE, THYROIDAB in the last 72 hours. Anemia Panel: No results for input(s): VITAMINB12, FOLATE, FERRITIN, TIBC, IRON, RETICCTPCT in the last 72 hours. Urine analysis: No results found for: COLORURINE, APPEARANCEUR, LABSPEC, PHURINE, GLUCOSEU, HGBUR, BILIRUBINUR, KETONESUR, PROTEINUR, UROBILINOGEN, NITRITE, LEUKOCYTESUR  Radiological Exams on Admission: CT Abdomen Pelvis  Wo Contrast  Result Date: 12/02/2019 CLINICAL DATA:  Unwitnessed fall. EXAM: CT ABDOMEN AND PELVIS WITHOUT CONTRAST TECHNIQUE: Multidetector CT imaging of the abdomen and pelvis was performed following the standard protocol without IV contrast. COMPARISON:  November 22, 2019.  January 02, 2018. FINDINGS: Lower chest: Minimal bilateral posterior basilar subsegmental atelectasis is noted. Hepatobiliary: No focal liver abnormality is seen. No gallstones, gallbladder wall thickening, or biliary dilatation. Pancreas: Unremarkable. No pancreatic ductal dilatation or surrounding inflammatory changes. Spleen: Normal in size without focal abnormality. Adrenals/Urinary Tract: Adrenal glands appear normal. No renal or ureteral calculi are noted. No hydronephrosis or renal obstruction is noted. Urinary bladder is unremarkable. 1.6 cm rounded echogenic focus is noted in lower pole of right kidney which corresponds in location to cyst seen on prior exam of 2019. Stomach/Bowel: Stomach  is within normal limits. Appendix appears normal. No evidence of bowel wall thickening, distention, or inflammatory changes. Vascular/Lymphatic: No significant vascular findings are present. No enlarged abdominal or pelvic lymph nodes. Reproductive: Prostate is unremarkable. Other: No abdominal wall hernia or abnormality. No abdominopelvic ascites. Musculoskeletal: Mildly displaced fractures are seen involving the left tenth, eleventh and twelfth ribs. IMPRESSION: 1. Mildly displaced fractures are seen involving the left tenth, eleventh and twelfth ribs. 2. 1.6 cm rounded echogenic focus is noted in lower pole of right kidney which corresponds in location to cyst seen on prior exam of 2019. This most likely represents hyperdense cyst, but renal ultrasound is recommended for confirmation and to rule out mass. Electronically Signed   By: Lupita Raider M.D.   On: 12/02/2019 13:25   CT Head Wo Contrast  Result Date: 12/02/2019 CLINICAL DATA:  Syncope,  hypotension, altered mental status. Additional provided: Unwitnessed fall, positive loss of consciousness, back pain. EXAM: CT HEAD WITHOUT CONTRAST TECHNIQUE: Contiguous axial images were obtained from the base of the skull through the vertex without intravenous contrast. COMPARISON:  Brain MRI 08/10/2019, CT angiogram head/neck and non-contrast CT head 08/09/2019 FINDINGS: Brain: Redemonstrated, now chronic, infarct within the mid left frontal lobe and left frontal operculum. There is no acute intracranial hemorrhage. No acute demarcated cortical infarct is identified. No extra-axial fluid collection. No evidence of intracranial mass. No midline shift. Vascular: No hyperdense vessel. Skull: Normal. Negative for fracture or focal lesion. Sinuses/Orbits: Visualized orbits show no acute finding. Complete opacification of the left maxillary sinus at the imaged levels consistent with known chronic left maxillary sinusitis. Otherwise mild scattered paranasal sinus mucosal thickening, most notably ethmoid. No significant mastoid effusion. Other: Interval decrease in size of a partially calcified cutaneous/subcutaneous mass along the lateral aspect of the left temporal bone and involving the external ear, now measuring 4.9 x 1.8 cm in transaxial dimensions. IMPRESSION: No CT evidence of acute intracranial abnormality. Redemonstrated cortically based infarct within the mid left frontal lobe and left frontal operculum, now chronic. Paranasal sinus disease as described, most notably left maxillary. Interval decrease in size of a partially calcified cutaneous/subcutaneous mass along the lateral aspect of the left temporal bone and involving the left external ear, now 4.9 x 1.8 cm. Clinical correlation is recommended. Electronically Signed   By: Jackey Loge DO   On: 12/02/2019 13:27   DG Chest Portable 1 View  Result Date: 12/02/2019 CLINICAL DATA:  54 year old male with history of trauma from a fall. Hypotension. EXAM:  PORTABLE CHEST 1 VIEW COMPARISON:  No priors. FINDINGS: Lung volumes are normal. No consolidative airspace disease. No pleural effusions. No pneumothorax. No pulmonary nodule or mass noted. Pulmonary vasculature and the cardiomediastinal silhouette are within normal limits. IMPRESSION: No radiographic evidence of acute cardiopulmonary disease. Electronically Signed   By: Trudie Reed M.D.   On: 12/02/2019 11:19    EKG: Independently reviewed.  72 bpm, normal axis, normal intervals, sinus rhythm, poor R wave progression, no ST segment changes, negative T wave lead II, III, aVF  Assessment/Plan Principal Problem:   Syncope Active Problems:   Chronic combined systolic and diastolic CHF (congestive heart failure) ([facility])   Nonischemic cardiomyopathy ([facility])   CVA (cerebral vascular accident) ([facility])   PFO (patent foramen ovale)   CKD stage G2/A2, GFR 60-89 and albumin creatinine ratio 30-299 mg/g   Tobacco abuse   Hypotension   54 year old male with significant past medical history for nonischemic cardiomyopathy, last ejection fraction 40 to 45% who presents  with a syncope episode.  The event occurred while he was moving boxes with his upper extremities, no apparent prodromal symptoms.  He has been experiencing nausea and vomiting for 2 to 3 days, very poor oral intake, and the dose of his afterload reducing agents was recently increased.  On his initial physical examination his blood pressure was 72/50, temperature 97.6, heart rate 78, respiratory rate 20, oxygen saturation 98% on room air, he is pale, dry mucous membranes, lungs clear to auscultation bilaterally, heart S1-S2 present, rhythmic, no gallop, abdomen soft nontender, no lower extremity edema. Sodium 137, potassium 4.2, chloride 95, bicarb 31, glucose 124, BUN 26, creatinine 2.3, white count 9.1, hemoglobin 14.8, hematocrit 44.9, platelets 230.  Troponin I 7, SARS COVID-19 negative.  CT head no acute changes, chronic cortically based infarct  within the mid left frontal lobe and left frontal operculum.  Chest radiograph no infiltrates.  Patient admitted to the hospital with a working diagnosis of hypovolemic syncope.   1.  Hypovolemic syncope.  Patient has received 2 L of isotonic saline in the emergency department.  Continue volume resuscitation with isotonic saline at 100 mL/h.  Continue telemetry monitoring.  Hold Entresto and carvedilol.  His ejection fraction left ventricle is 40 to 45%, he gets benefit from these medications but will be resumed once volume has been restored.  Check orthostatic vital signs in a.m. PT and OT evaluation.   2.  Acute kidney injury on chronic kidney disease stage IIa.  Continue volume resuscitation with isotonic saline, follow-up kidney function in the morning, avoid further hypotension or nephrotoxic agents.  3.  Dyslipidemia.  Continue atorvastatin.  Check CPK.  4.  Dyspepsia/gastritis.  Will order soft diet and antiacid therapy with pantoprazole and sucralfate. Status is: Observation  The patient remains OBS appropriate and will d/c before 2 midnights.  Dispo: The patient is from: Home              Anticipated d/c is to: Home              Anticipated d/c date is: 1 day              Patient currently is not medically stable to d/c.       DVT prophylaxis: Enoxaparin   Code Status:   full  Family Communication:  No family at the beside     Consults called:  Cardiology by the ED   Admission status:  Observation    Arabella Revelle Annett Gula MD [facility]   12/02/2019, 5:35 PM

## 2019-12-03 ENCOUNTER — Encounter (HOSPITAL_COMMUNITY): Payer: Self-pay | Admitting: Internal Medicine

## 2019-12-03 ENCOUNTER — Ambulatory Visit (HOSPITAL_COMMUNITY): Payer: Self-pay

## 2019-12-03 DIAGNOSIS — R55 Syncope and collapse: Secondary | ICD-10-CM | POA: Diagnosis present

## 2019-12-03 DIAGNOSIS — I340 Nonrheumatic mitral (valve) insufficiency: Secondary | ICD-10-CM

## 2019-12-03 LAB — COMPREHENSIVE METABOLIC PANEL
ALT: 14 U/L (ref 0–44)
AST: 24 U/L (ref 15–41)
Albumin: 2.9 g/dL — ABNORMAL LOW (ref 3.5–5.0)
Alkaline Phosphatase: 66 U/L (ref 38–126)
Anion gap: 9 (ref 5–15)
BUN: 17 mg/dL (ref 6–20)
CO2: 25 mmol/L (ref 22–32)
Calcium: 8 mg/dL — ABNORMAL LOW (ref 8.9–10.3)
Chloride: 101 mmol/L (ref 98–111)
Creatinine, Ser: 1.32 mg/dL — ABNORMAL HIGH (ref 0.61–1.24)
GFR calc Af Amer: >60 mL/min (ref >60)
GFR calc non Af Amer: >60 mL/min (ref >60)
Glucose, Bld: 100 mg/dL — ABNORMAL HIGH (ref 70–99)
Potassium: 3.7 mmol/L (ref 3.5–5.1)
Sodium: 135 mmol/L (ref 135–145)
Total Bilirubin: 1.2 mg/dL (ref 0.3–1.2)
Total Protein: 5.7 g/dL — ABNORMAL LOW (ref 6.5–8.1)

## 2019-12-03 LAB — HIV ANTIBODY (ROUTINE TESTING W REFLEX): HIV Screen 4th Generation wRfx: NONREACTIVE

## 2019-12-03 LAB — ECHOCARDIOGRAM COMPLETE
Height: 71 in
Weight: 2525.59 oz

## 2019-12-03 LAB — CREATININE, SERUM
Creatinine, Ser: 1.43 mg/dL — ABNORMAL HIGH (ref 0.61–1.24)
GFR calc Af Amer: >60 mL/min (ref >60)
GFR calc non Af Amer: 55 mL/min — ABNORMAL LOW (ref >60)

## 2019-12-03 LAB — CK: Total CK: 513 U/L — ABNORMAL HIGH (ref 49–397)

## 2019-12-03 MED ORDER — TRAMADOL HCL 50 MG PO TABS
50.0000 mg | ORAL_TABLET | Freq: Two times a day (BID) | ORAL | Status: AC | PRN
  Administered 2019-12-03: 50 mg via ORAL
  Filled 2019-12-03: qty 1

## 2019-12-03 NOTE — Progress Notes (Addendum)
Occupational Therapy Evaluation Patient Details Name: David [name] MRN: [id number] DOB: 1965-10-07 Today's Date: 12/03/2019    History of Present Illness David [name] is a 54 y.o. male with medical history significant of systolic heart failure with nonischemic cardiomyopathy, chronic kidney disease stage IIa, CVA and tobacco abuse. Patient presented after experiencing a syncope episode.  He was standing, moving boxes with his upper extremities, the boxes had medium weight.  While doing this he lost his consciousness, he does not recall any prodromal symptoms.  He collapsed to the floor, EMS was called and he was found diaphoretic and hypotensive with a blood pressure in the 70s systolic.  Positive laceration in his left ear.     Clinical Impression   PTA, pt completely Independent with ADLs, IADLs, mobility and driving. Pt works full time as a Location manager. Pt presents with increased low back/rib pain and dizziness during transitional movements (+orthostatic BP readings - see below). Pt Min A for bed mobility secondary to pain and overall min guard for remaining of movements. Pt progressed to supervision for mobility to sink to complete grooming tasks in standing for > 3 minutes with some improvements in dizziness. Provided education to pt on strategies to implement to decrease fall risk at home if dizziness persists, as well as ways to alleviate back pain when completing LB ADLs. Pt reports fiance does not work and can assist him at home as needed while recovering. No OT needs anticipated at DC, but would benefit from acute services to maximize safety with daily tasks and mobility.     Follow Up Recommendations  No OT follow up;Supervision - Intermittent    Equipment Recommendations  None recommended by OT    Recommendations for Other Services       Precautions / Restrictions Precautions Precautions: Other (comment) Precaution Comments: monitor orthostatic BP Restrictions Weight  Bearing Restrictions: No      Mobility Bed Mobility Overal bed mobility: Needs Assistance Bed Mobility: Supine to Sit;Sit to Supine     Supine to sit: Min assist Sit to supine: Min guard   General bed mobility comments: Min A to support trunk while moving to EOB due to pt rib/back pain. min guard on return to prevent B LE from dropping if pain back increased  Transfers Overall transfer level: Needs assistance Equipment used: None Transfers: Sit to/from Stand Sit to Stand: Min assist;Min guard         General transfer comment: Min A progressing to min guard for sit to stand transfers at bedside when assessing orthostatic BP    Balance Overall balance assessment: Mild deficits observed, not formally tested (suspect unsteadiness due to dizziness and will improve )                                         ADL either performed or assessed with clinical judgement   ADL Overall ADL's : Needs assistance/impaired Eating/Feeding: Independent;Sitting   Grooming: Supervision/safety;Standing;Wash/dry hands;Wash/dry face;Oral care Grooming Details (indicate cue type and reason): Min guard progressing to supervision in standing without AD to ensure safety due to dizziness  Upper Body Bathing: Independent;Sitting   Lower Body Bathing: Supervison/ safety;Sit to/from stand   Upper Body Dressing : Independent;Sitting   Lower Body Dressing: Minimal assistance;Sit to/from stand Lower Body Dressing Details (indicate cue type and reason): Min A to adjust socks sitting EOB due to back pain with movement.  Educated on ways to complete task without bending, encouraged to have fiance assist as needed at home Toilet Transfer: Min guard;Ambulation;Regular Toilet   Toileting- Architect and Hygiene: Supervision/safety;Sit to/from stand       Functional mobility during ADLs: Min guard;Supervision/safety General ADL Comments: min guard initially to ensure safety and  maintenance of steadiness on feet due to dizziness, dehydration, and low BPs. Pt with pain during movement impacting ability to complete LB ADLs, but suspect pain will improve and pt return to independence quickly     Vision Baseline Vision/History: No visual deficits Patient Visual Report: No change from baseline Vision Assessment?: No apparent visual deficits     Perception     Praxis      Pertinent Vitals/Pain Pain Assessment: Faces Faces Pain Scale: Hurts even more Pain Location: low back, left ribs Pain Descriptors / Indicators: Aching;Discomfort;Grimacing;Sore Pain Intervention(s): Limited activity within patient's tolerance;Monitored during session;Repositioned (educated on strategies to prevent back pain during movement)     Hand Dominance Right   Extremity/Trunk Assessment Upper Extremity Assessment Upper Extremity Assessment: Overall WFL for tasks assessed   Lower Extremity Assessment Lower Extremity Assessment: Defer to PT evaluation       Communication Communication Communication: No difficulties   Cognition Arousal/Alertness: Awake/alert Behavior During Therapy: WFL for tasks assessed/performed Overall Cognitive Status: Within Functional Limits for tasks assessed                                     General Comments  Pt reporting rib/low back pain with movement. Educated in ways to alleviate or prevent back pain when attempting to complete tasks. Pt reporting mild dizziness that improved throughout session. Assessed orthostatic BPs with 108/84 lying in bed, 112/78 sitting EOB, 96/65 standing at bedside and 102/70 after standing for 1 minute.     Exercises     Shoulder Instructions      Home Living Family/patient expects to be discharged to:: Private residence Living Arrangements: Spouse/significant other Available Help at Discharge: Family (fiance does not work and can be home with pt) Type of Home: Apartment Home Access: Level entry      Home Layout: One level     Bathroom Shower/Tub: Tub/shower unit         Home Equipment: None          Prior Functioning/Environment Level of Independence: Independent        Comments: Pt completely Independent with ADLs, IADLs, mobility and driving. 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 )        OT Problem List: Decreased activity tolerance;Pain;Impaired balance (sitting and/or standing)      OT Treatment/Interventions: Self-care/ADL training;Therapeutic exercise;Energy conservation;Therapeutic activities;Patient/family education    OT Goals(Current goals can be found in the care plan section) Acute Rehab OT Goals Patient Stated Goal: pain control, feel better and go home soon OT Goal Formulation: With patient Time For Goal Achievement: 12/17/19 Potential to Achieve Goals: Good ADL Goals Pt Will Perform Lower Body Dressing: Independently;sit to/from stand Additional ADL Goal #1: Pt to demonstrate toileting tasks Independently, including ability to ambulate to bathroom with good pacing and no abnormal changes in vitals. Additional ADL Goal #2: Pt to verbalize at least 3 strategies to implement during daily tasks to prevent further injury, decrease fall risk, and avoid exacerbation of pain.  OT Frequency: Min 2X/week   Barriers to  D/C:            Co-evaluation              AM-PAC OT "6 Clicks" Daily Activity     Outcome Measure Help from another person eating meals?: None Help from another person taking care of personal grooming?: A Little Help from another person toileting, which includes using toliet, bedpan, or urinal?: A Little Help from another person bathing (including washing, rinsing, drying)?: A Little Help from another person to put on and taking off regular upper body clothing?: None Help from another person to put on and taking off regular lower body clothing?: A Little 6 Click Score: 20   End of  Session Equipment Utilized During Treatment: Gait belt Nurse Communication: Mobility status;Other (comment) (BP readings)  Activity Tolerance: Patient tolerated treatment well Patient left: in bed;with call bell/phone within reach;with bed alarm set  OT Visit Diagnosis: Unsteadiness on feet (R26.81)                Time: 9244-6286 OT Time Calculation (min): 28 min Charges:  OT General Charges $OT Visit: 1 Visit OT Evaluation $OT Eval Moderate Complexity: 1 Mod OT Treatments $Self Care/Home Management : 8-22 mins  Lorre Munroe, OTR/L  Lorre Munroe 12/03/2019, 9:44 AM

## 2019-12-03 NOTE — Progress Notes (Signed)
  Echocardiogram 2D Echocardiogram has been performed.  David [name] 12/03/2019, 4:25 PM

## 2019-12-03 NOTE — TOC Initial Note (Signed)
Transition of Care [facility]) - Initial/Assessment Note    Patient Details  Name: David [name] MRN: [id number] Date of Birth: November 26, 1965  Transition [facility]) CM/SW Contact:    Kingsley Plan, RN Phone Number: 12/03/2019, 2:11 PM  Clinical Narrative:                 Confirmed face sheet information with patient.   PCP is Dr Kateri Plummer.   Patient lives alone but has friends and family close by if needed.   PTA patient does not have any DME. PT note : assess for cane vs walker tomorrow. Will follow up tomorrow.   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 Patient consented for NCM to call his job , [employer] [phone] and speak to [employer]. NCM called left a message for Fredna Dow in [employer].   If patient is going to be covered by Workers Comp, NCM will need Workers Comp information to get approval for OP PT and medications at discharge and any DME needs.   Patient voiced understanding of above. Consented for NCM to place order for [facility], same done.  Expected Discharge Plan: Home/Self Care Barriers to Discharge: Continued Medical Work up   Patient Goals and CMS Choice Patient states their goals for this hospitalization and ongoing recovery are:: to return to home CMS Medicare.gov Compare Post Acute Care list provided to:: Patient Choice offered to / list presented to : Patient  Expected Discharge Plan and Services Expected Discharge Plan: Home/Self Care   Discharge Planning Services: CM Consult Post Acute Care Choice:  (OP PT) Living arrangements for the past 2 months: Single Family Home                 DME Arranged: N/A         HH Arranged: NA          Prior Living Arrangements/Services Living arrangements for the past 2 months: Single Family Home Lives with:: Self Patient language and need for interpreter reviewed:: Yes        Need for Family Participation in Patient Care: Yes  (Comment) Care giver support system in place?: Yes (comment)   Criminal Activity/Legal Involvement Pertinent to Current Situation/Hospitalization: No - Comment as needed  Activities of Daily Living Home Assistive Devices/Equipment: None ADL Screening (condition at time of admission) Patient's cognitive ability adequate to safely complete daily activities?: Yes Is the patient deaf or have difficulty hearing?: No Does the patient have difficulty seeing, even when wearing glasses/contacts?: No Does the patient have difficulty concentrating, remembering, or making decisions?: Yes Patient able to express need for assistance with ADLs?: No Does the patient have difficulty dressing or bathing?: No Independently performs ADLs?: Yes (appropriate for developmental age) Does the patient have difficulty walking or climbing stairs?: Yes Weakness of Legs: Both Weakness of Arms/Hands: None  Permission Sought/Granted   Permission granted to share information with : No              Emotional Assessment Appearance:: Appears stated age Attitude/Demeanor/Rapport: Engaged Affect (typically observed): Accepting Orientation: : Oriented to Self, Oriented to Place, Oriented to  Time, Oriented to Situation Alcohol / Substance Use: Not Applicable Psych Involvement: No (comment)  Admission diagnosis:  Vasovagal syncope [R55] AKI (acute kidney injury) (HCC) [N17.9] Hypotension [I95.9] Syncope [R55] Patient Active Problem List   Diagnosis Date Noted   Chronic combined systolic and diastolic CHF (congestive heart failure) (HCC)  12/02/2019   Nonischemic cardiomyopathy (HCC) 12/02/2019   CVA (cerebral vascular accident) (HCC) 12/02/2019   PFO (patent foramen ovale) 12/02/2019   CKD stage G2/A2, GFR 60-89 and albumin creatinine ratio 30-299 mg/g 12/02/2019   Tobacco abuse 12/02/2019   Hypotension 12/02/2019   Syncope 12/02/2019   PCP:  Janeece Agee, NP Pharmacy:   [facility] [phone] Ginette Otto, [place] - [address] [place] [address] Phone: [phone] Fax: [phone]     Social Determinants of Health (SDOH) Interventions    Readmission Risk Interventions No flowsheet data found.

## 2019-12-03 NOTE — Progress Notes (Signed)
Ordered 2D echo to r/o any structural cardiac abnomalities.  Will follow results.

## 2019-12-03 NOTE — Progress Notes (Addendum)
PROGRESS NOTE  DEUNDRA BARD VQM:[id number] DOB: September 15, 1965 DOA: 12/02/2019 PCP: Janeece Agee, NP  HPI/Recap of past 24 hours: Kishon J Carolan is a 54 y.o. male with medical history significant of systolic heart failure with nonischemic cardiomyopathy, chronic kidney disease stage IIa, CVA and tobacco abuse.   Patient presented after experiencing a syncope episode.  He was standing, moving boxes with his upper extremities, the boxes had medium weight.  While doing this he lost his consciousness, he does not recall any prodromal symptoms.  He collapsed to the floor, EMS was called and he was found diaphoretic and hypotensive with a blood pressure in the 70s systolic.  Positive laceration in his left ear.  He has been experiencing nausea and vomiting for the last 2 to 3 days, unable to fully tolerate p.o. intake.   There is a reported his antihypertensive agents were recently uptitrated at the outpatient clinic.  ED Course: Patient was found hypotensive, he received 2 L of isotonic saline along with 50 mcg of fentanyl and 1 tablet of Vicodin.  He was referred for admission for evaluation.  12/03/19: Seen and examined at his bedside.  Denies any chest pain, palpitations, dizziness or dyspnea.  Weak appearing.  He has had poor oral intake, currently on IV fluids.  Seen by cardiology recommended to continue to hold off Entresto and Coreg due to concern for hypotension.  Assessment/Plan: Principal Problem:   Syncope Active Problems:   Chronic combined systolic and diastolic CHF (congestive heart failure) (HCC)   Nonischemic cardiomyopathy (HCC)   CVA (cerebral vascular accident) (HCC)   PFO (patent foramen ovale)   CKD stage G2/A2, GFR 60-89 and albumin creatinine ratio 30-299 mg/g   Tobacco abuse   Hypotension  Hypovolemic syncope.   Reports nausea and vomiting 2 days prior to his syncopal episode Poor oral intake, continue IV fluid Continue to hold off Entresto and Coreg Obtain  orthostatic vital signs daily Seen by cardiology, appreciate assistance Seen by PT, no further recommendations Continue telemetry monitoring Continue fall precautions  Acute kidney injury on chronic kidney disease stage II, likely prerenal in the setting of dehydration from poor oral intake, nausea and vomiting.   Presented with creatinine 2.48 with GFR 33 CT abdomen and pelvis showed 1.6 cm rounded echogenic focus noted in lower pole of right kidney which corresponds in location to cyst seen on prior exam of 2019.This most likely represents hyperdense cyst, but renal ultrasound is recommended for confirmation and to rule out mass. Creatinine downtrending 1.3 with GFR greater than 60  Monitor urine output Avoid nephrotoxins, dehydration, and hypotension Continue IV fluid hydration  Dehydration in the setting of poor oral intake, nausea and vomiting Encourage oral intake Continue IV fluid, monitor volume status while on IV fluids. Continue antiemetics as needed  Elevated CPK Presented with CPK of 513 Repeat level in the morning If CPK continues to trend up may consider holding off statin  Hypotension Systolic BP in the low 100 Continue to hold off Entresto and Coreg Obtain orthostatic vital signs daily Maintain MAP greater than 65 Continue to monitor vital signs  Mildly displaced rib fractures involving left 10th, 11th and 12th ribs, incidentally found Closely monitor oxygen saturation, 97%. Pain control  Nonischemic cardiomyopathy Seen by cardiology Denies any anginal symptoms at the time of this visit Continue to hold off Entresto and Coreg due to soft blood pressures  History of CVA Continue aspirin  Dyslipidemia.   Continue atorvastatin.   Dyspepsia/gastritis.   Improved  Continue PPI  Antiemetics as needed   Resolved hyperkalemia likely in the setting of dehydration Presented with serum potassium 5.4 Serum potassium 3.7   DVT prophylaxis:  Subcu heparin  3 times daily. Code Status:   Full code. Family Communication:       No family at the beside    Status is: Inpatient    Dispo:  Patient From: Home  Planned Disposition: Home  Expected discharge date: 12/04/19  Medically stable for discharge: No.  Patient is currently not appropriate for discharge at this time due to ongoing treatment for dehydration, AKI, in the setting of poor oral intake with by IV fluids.    Objective: Vitals:   12/03/19 0145 12/03/19 0156 12/03/19 0226 12/03/19 0527  BP: 111/63  114/64 105/64  Pulse: 66  66 60  Resp: (!) 26  20 20   Temp:  98.3 F (36.8 C) 98.3 F (36.8 C) 98.4 F (36.9 C)  TempSrc:  Oral Oral Oral  SpO2: 100%  98% 97%  Weight:   71.6 kg   Height:        Intake/Output Summary (Last 24 hours) at 12/03/2019 1141 Last data filed at 12/03/2019 0300 Gross per 24 hour  Intake 518.33 ml  Output --  Net 518.33 ml   Filed Weights   12/02/19 1105 12/03/19 0226  Weight: 72.6 kg 71.6 kg    Exam:  . General: 54 y.o. year-old male well developed well nourished in no acute distress.  Alert and oriented x3. . Cardiovascular: Regular rate and rhythm with no rubs or gallops.  No thyromegaly or JVD noted.   53 Respiratory: Clear to auscultation with no wheezes or rales. Good inspiratory effort. . Abdomen: Soft nontender nondistended with normal bowel sounds x4 quadrants. . Musculoskeletal: No lower extremity edema.  [name] Psychiatry: Mood is appropriate for condition and setting   Data Reviewed: CBC: Recent Labs  Lab 12/02/19 1113 12/02/19 1115  WBC 9.1  --   NEUTROABS 5.1  --   HGB 14.8 15.3  HCT 44.9 45.0  MCV 95.7  --   PLT 230  --    Basic Metabolic Panel: Recent Labs  Lab 12/02/19 1113 12/02/19 1115 12/02/19 2313 12/03/19 0624  NA 136 137  --  135  K 5.4* 4.2  --  3.7  CL 94* 95*  --  101  CO2 31  --   --  25  GLUCOSE 128* 124*  --  100*  BUN 22* 26*  --  17  CREATININE 2.48* 2.30* 1.43* 1.32*  CALCIUM 9.1  --   --  8.0*    GFR: Estimated Creatinine Clearance: 64 mL/min (A) (by C-G formula based on SCr of 1.32 mg/dL (H)). Liver Function Tests: Recent Labs  Lab 12/02/19 1113 12/03/19 0624  AST 30 24  ALT 21 14  ALKPHOS 83 66  BILITOT 1.8* 1.2  PROT 7.1 5.7*  ALBUMIN 3.9 2.9*   No results for input(s): LIPASE, AMYLASE in the last 168 hours. No results for input(s): AMMONIA in the last 168 hours. Coagulation Profile: Recent Labs  Lab 12/02/19 1113  INR 1.1   Cardiac Enzymes: Recent Labs  Lab 12/03/19 0624  CKTOTAL 513*   BNP (last 3 results) No results for input(s): PROBNP in the last 8760 hours. HbA1C: No results for input(s): HGBA1C in the last 72 hours. CBG: No results for input(s): GLUCAP in the last 168 hours. Lipid Profile: No results for input(s): CHOL, HDL, LDLCALC, TRIG, CHOLHDL, LDLDIRECT in the last  72 hours. Thyroid Function Tests: No results for input(s): TSH, T4TOTAL, FREET4, T3FREE, THYROIDAB in the last 72 hours. Anemia Panel: No results for input(s): VITAMINB12, FOLATE, FERRITIN, TIBC, IRON, RETICCTPCT in the last 72 hours. Urine analysis: No results found for: COLORURINE, APPEARANCEUR, LABSPEC, PHURINE, GLUCOSEU, HGBUR, BILIRUBINUR, KETONESUR, PROTEINUR, UROBILINOGEN, NITRITE, LEUKOCYTESUR Sepsis Labs: @LABRCNTIP (procalcitonin:4,lacticidven:4)  ) Recent Results (from the past 240 hour(s))  SARS Coronavirus 2 by RT PCR (hospital order, performed in [facility] hospital lab) Nasopharyngeal Nasopharyngeal Swab     Status: None   Collection Time: 12/02/19  1:07 PM   Specimen: Nasopharyngeal Swab  Result Value Ref Range Status   SARS Coronavirus 2 NEGATIVE NEGATIVE Final    Comment: (NOTE) SARS-CoV-2 target nucleic acids are NOT DETECTED.  The SARS-CoV-2 RNA is generally detectable in upper and lower respiratory specimens during the acute phase of infection. The lowest concentration of SARS-CoV-2 viral copies this assay can detect is 250 copies / mL. A negative  result does not preclude SARS-CoV-2 infection and should not be used as the sole basis for treatment or other patient management decisions.  A negative result may occur with improper specimen collection / handling, submission of specimen other than nasopharyngeal swab, presence of viral mutation(s) within the areas targeted by this assay, and inadequate number of viral copies (<250 copies / mL). A negative result must be combined with clinical observations, patient history, and epidemiological information.  Fact Sheet for Patients:   BoilerBrush.com.cy  Fact Sheet for Healthcare Providers: https://pope.com/  This test is not yet approved or  cleared by the [place] FDA and has been authorized for detection and/or diagnosis of SARS-CoV-2 by FDA under an Emergency Use Authorization (EUA).  This EUA will remain in effect (meaning this test can be used) for the duration of the COVID-19 declaration under Section 564(b)(1) of the Act, 21 U.S.C. section 360bbb-3(b)(1), unless the authorization is terminated or revoked sooner.  Performed at [facility], [address], [place], [place] [address]       Studies: CT Abdomen Pelvis Wo Contrast  Result Date: 12/02/2019 CLINICAL DATA:  Unwitnessed fall. EXAM: CT ABDOMEN AND PELVIS WITHOUT CONTRAST TECHNIQUE: Multidetector CT imaging of the abdomen and pelvis was performed following the standard protocol without IV contrast. COMPARISON:  November 22, 2019.  January 02, 2018. FINDINGS: Lower chest: Minimal bilateral posterior basilar subsegmental atelectasis is noted. Hepatobiliary: No focal liver abnormality is seen. No gallstones, gallbladder wall thickening, or biliary dilatation. Pancreas: Unremarkable. No pancreatic ductal dilatation or surrounding inflammatory changes. Spleen: Normal in size without focal abnormality. Adrenals/Urinary Tract: Adrenal glands appear normal. No renal or ureteral  calculi are noted. No hydronephrosis or renal obstruction is noted. Urinary bladder is unremarkable. 1.6 cm rounded echogenic focus is noted in lower pole of right kidney which corresponds in location to cyst seen on prior exam of 2019. Stomach/Bowel: Stomach is within normal limits. Appendix appears normal. No evidence of bowel wall thickening, distention, or inflammatory changes. Vascular/Lymphatic: No significant vascular findings are present. No enlarged abdominal or pelvic lymph nodes. Reproductive: Prostate is unremarkable. Other: No abdominal wall hernia or abnormality. No abdominopelvic ascites. Musculoskeletal: Mildly displaced fractures are seen involving the left tenth, eleventh and twelfth ribs. IMPRESSION: 1. Mildly displaced fractures are seen involving the left tenth, eleventh and twelfth ribs. 2. 1.6 cm rounded echogenic focus is noted in lower pole of right kidney which corresponds in location to cyst seen on prior exam of 2019. This most likely represents hyperdense cyst, but  renal ultrasound is recommended for confirmation and to rule out mass. Electronically Signed   By: Lupita Raider M.D.   On: 12/02/2019 13:25   CT Head Wo Contrast  Result Date: 12/02/2019 CLINICAL DATA:  Syncope, hypotension, altered mental status. Additional provided: Unwitnessed fall, positive loss of consciousness, back pain. EXAM: CT HEAD WITHOUT CONTRAST TECHNIQUE: Contiguous axial images were obtained from the base of the skull through the vertex without intravenous contrast. COMPARISON:  Brain MRI 08/10/2019, CT angiogram head/neck and non-contrast CT head 08/09/2019 FINDINGS: Brain: Redemonstrated, now chronic, infarct within the mid left frontal lobe and left frontal operculum. There is no acute intracranial hemorrhage. No acute demarcated cortical infarct is identified. No extra-axial fluid collection. No evidence of intracranial mass. No midline shift. Vascular: No hyperdense vessel. Skull: Normal. Negative  for fracture or focal lesion. Sinuses/Orbits: Visualized orbits show no acute finding. Complete opacification of the left maxillary sinus at the imaged levels consistent with known chronic left maxillary sinusitis. Otherwise mild scattered paranasal sinus mucosal thickening, most notably ethmoid. No significant mastoid effusion. Other: Interval decrease in size of a partially calcified cutaneous/subcutaneous mass along the lateral aspect of the left temporal bone and involving the external ear, now measuring 4.9 x 1.8 cm in transaxial dimensions. IMPRESSION: No CT evidence of acute intracranial abnormality. Redemonstrated cortically based infarct within the mid left frontal lobe and left frontal operculum, now chronic. Paranasal sinus disease as described, most notably left maxillary. Interval decrease in size of a partially calcified cutaneous/subcutaneous mass along the lateral aspect of the left temporal bone and involving the left external ear, now 4.9 x 1.8 cm. Clinical correlation is recommended. Electronically Signed   By: Jackey Loge DO   On: 12/02/2019 13:27    Scheduled Meds: . aspirin EC  81 mg Oral BID  . atorvastatin  80 mg Oral Daily  . heparin  5,000 Units Subcutaneous Q8H  . pantoprazole  40 mg Oral BID  . sucralfate  1 g Oral TID WC & HS    Continuous Infusions: . sodium chloride 100 mL/hr at 12/02/19 2149     LOS: 0 days     Darlin Drop, MD [facility] Pager [phone]  If 7PM-7AM, please contact night-coverage [email or web address] Password [facility] 12/03/2019, 11:41 AM

## 2019-12-03 NOTE — Progress Notes (Signed)
Progress Note  Patient Name: David [name] Date of Encounter: 12/03/2019  [facility] Cardiologist: David Millers, MD   Subjective   [name] CP or dyspnea; mild dizziness with standing  Inpatient Medications    Scheduled Meds: . aspirin EC  81 mg Oral BID  . atorvastatin  80 mg Oral Daily  . heparin  5,000 Units Subcutaneous Q8H  . pantoprazole  40 mg Oral BID  . sucralfate  1 g Oral TID WC & HS   Continuous Infusions: . sodium chloride 100 mL/hr at 12/02/19 2149   PRN Meds: acetaminophen **OR** acetaminophen, ondansetron **OR** ondansetron (ZOFRAN) IV   Vital Signs    Vitals:   12/03/19 0145 12/03/19 0156 12/03/19 0226 12/03/19 0527  BP: 111/63  114/64 105/64  Pulse: 66  66 60  Resp: (!) 26  20 20   Temp:  98.3 F (36.8 C) 98.3 F (36.8 C) 98.4 F (36.9 C)  TempSrc:  Oral Oral Oral  SpO2: 100%  98% 97%  Weight:   71.6 kg   Height:        Intake/Output Summary (Last 24 hours) at 12/03/2019 0832 Last data filed at 12/03/2019 0300 Gross per 24 hour  Intake 1218.33 ml  Output 0 ml  Net 1218.33 ml   Last 3 Weights 12/03/2019 12/02/2019  Weight (lbs) 157 lb 13.6 oz 160 lb  Weight (kg) 71.6 kg 72.576 kg      Telemetry    [name] telemetry to review  Physical Exam   GEN: [name] acute distress.   Neck: [name] JVD Cardiac: RRR, [name] murmurs, rubs, or gallops.  Respiratory: Clear to auscultation bilaterally. GI: Soft, nontender, non-distended  MS: [name] edema Neuro:  Nonfocal  Psych: Normal affect   Labs    High Sensitivity Troponin:   Recent Labs  Lab 12/02/19 1113 12/02/19 1323  TROPONINIHS 7 8      Chemistry Recent Labs  Lab 12/02/19 1113 12/02/19 1113 12/02/19 1115 12/02/19 2313 12/03/19 0624  NA 136  --  137  --  135  K 5.4*  --  4.2  --  3.7  CL 94*  --  95*  --  101  CO2 31  --   --   --  25  GLUCOSE 128*  --  124*  --  100*  BUN 22*  --  26*  --  17  CREATININE 2.48*   < > 2.30* 1.43* 1.32*  CALCIUM 9.1  --   --   --  8.0*  PROT 7.1  --   --    --  5.7*  ALBUMIN 3.9  --   --   --  2.9*  AST 30  --   --   --  24  ALT 21  --   --   --  14  ALKPHOS 83  --   --   --  66  BILITOT 1.8*  --   --   --  1.2  GFRNONAA 28*  --   --  55* >60  GFRAA 33*  --   --  >60 >60  ANIONGAP 11  --   --   --  9   < > = values in this interval not displayed.     Hematology Recent Labs  Lab 12/02/19 1113 12/02/19 1115  WBC 9.1  --   RBC 4.69  --   HGB 14.8 15.3  HCT 44.9 45.0  MCV 95.7  --   MCH 31.6  --  MCHC 33.0  --   RDW 11.4*  --   PLT 230  --     Radiology    CT Abdomen Pelvis Wo Contrast  Result Date: 12/02/2019 CLINICAL DATA:  Unwitnessed fall. EXAM: CT ABDOMEN AND PELVIS WITHOUT CONTRAST TECHNIQUE: Multidetector CT imaging of the abdomen and pelvis was performed following the standard protocol without IV contrast. COMPARISON:  November 22, 2019.  January 02, 2018. FINDINGS: Lower chest: Minimal bilateral posterior basilar subsegmental atelectasis is noted. Hepatobiliary: [name] focal liver abnormality is seen. [name] gallstones, gallbladder wall thickening, or biliary dilatation. Pancreas: Unremarkable. [name] pancreatic ductal dilatation or surrounding inflammatory changes. Spleen: Normal in size without focal abnormality. Adrenals/Urinary Tract: Adrenal glands appear normal. [name] renal or ureteral calculi are noted. [name] hydronephrosis or renal obstruction is noted. Urinary bladder is unremarkable. 1.6 cm rounded echogenic focus is noted in lower pole of right kidney which corresponds in location to cyst seen on prior exam of 2019. Stomach/Bowel: Stomach is within normal limits. Appendix appears normal. [name] evidence of bowel wall thickening, distention, or inflammatory changes. Vascular/Lymphatic: [name] significant vascular findings are present. [name] enlarged abdominal or pelvic lymph nodes. Reproductive: Prostate is unremarkable. Other: [name] abdominal wall hernia or abnormality. [name] abdominopelvic ascites. Musculoskeletal: Mildly displaced fractures are seen involving  the left tenth, eleventh and twelfth ribs. IMPRESSION: 1. Mildly displaced fractures are seen involving the left tenth, eleventh and twelfth ribs. 2. 1.6 cm rounded echogenic focus is noted in lower pole of right kidney which corresponds in location to cyst seen on prior exam of 2019. This most likely represents hyperdense cyst, but renal ultrasound is recommended for confirmation and to rule out mass. Electronically Signed   By: Lupita Raider M.D.   On: 12/02/2019 13:25   CT Head Wo Contrast  Result Date: 12/02/2019 CLINICAL DATA:  Syncope, hypotension, altered mental status. Additional provided: Unwitnessed fall, positive loss of consciousness, back pain. EXAM: CT HEAD WITHOUT CONTRAST TECHNIQUE: Contiguous axial images were obtained from the base of the skull through the vertex without intravenous contrast. COMPARISON:  Brain MRI 08/10/2019, CT angiogram head/neck and non-contrast CT head 08/09/2019 FINDINGS: Brain: Redemonstrated, now chronic, infarct within the mid left frontal lobe and left frontal operculum. There is [name] acute intracranial hemorrhage. [name] acute demarcated cortical infarct is identified. [name] extra-axial fluid collection. [name] evidence of intracranial mass. [name] midline shift. Vascular: [name] hyperdense vessel. Skull: Normal. Negative for fracture or focal lesion. Sinuses/Orbits: Visualized orbits show [name] acute finding. Complete opacification of the left maxillary sinus at the imaged levels consistent with known chronic left maxillary sinusitis. Otherwise mild scattered paranasal sinus mucosal thickening, most notably ethmoid. [name] significant mastoid effusion. Other: Interval decrease in size of a partially calcified cutaneous/subcutaneous mass along the lateral aspect of the left temporal bone and involving the external ear, now measuring 4.9 x 1.8 cm in transaxial dimensions. IMPRESSION: [name] CT evidence of acute intracranial abnormality. Redemonstrated cortically based infarct within the mid left  frontal lobe and left frontal operculum, now chronic. Paranasal sinus disease as described, most notably left maxillary. Interval decrease in size of a partially calcified cutaneous/subcutaneous mass along the lateral aspect of the left temporal bone and involving the left external ear, now 4.9 x 1.8 cm. Clinical correlation is recommended. Electronically Signed   By: Jackey Loge DO   On: 12/02/2019 13:27   DG Chest Portable 1 View  Result Date: 12/02/2019 CLINICAL DATA:  54 year old male with history of trauma from a fall. Hypotension. EXAM: PORTABLE CHEST 1  VIEW COMPARISON:  [name] priors. FINDINGS: Lung volumes are normal. [name] consolidative airspace disease. [name] pleural effusions. [name] pneumothorax. [name] pulmonary nodule or mass noted. Pulmonary vasculature and the cardiomediastinal silhouette are within normal limits. IMPRESSION: [name] radiographic evidence of acute cardiopulmonary disease. Electronically Signed   By: Trudie Reed M.D.   On: 12/02/2019 11:19    Patient Profile     54 year old male with past medical history of chronic combined systolic/diastolic congestive heart failure, prior CVA, chronic stage II-III kidney disease for evaluation of syncope.  Patient previously admitted with CVA and echocardiogram showed severely reduced LV function, moderate mitral regurgitation and mild left atrial enlargement.  Cardiac CTA revealed calcium score of 0 and [name] coronary disease.  Patient treated medically and follow-up echocardiogram June 2021 showed ejection fraction 40 to 45%, mild mitral regurgitation.  Patient admitted with nausea and vomiting and syncope.  Assessment & Plan    1 syncope-as outlined previously this appears to be orthostasis.  His nausea and vomiting have improved and he has been hydrated with some improvement in symptoms.  Would continue hydration today and follow blood pressure and for any symptoms of CHF.  Continue telemetry.  Can likely be discharged tomorrow if stable.  2  cardiomyopathy-nonischemic in etiology.  Recent coronary CTA showed [name] coronary disease.    Continue to hold Entresto and carvedilol today as blood pressure is borderline.  Will resume low-dose cardiac medications as tolerated by blood pressure later.  3 acute on chronic stage II kidney disease-creatinine improving this morning with hydration.  We will continue to follow.  Entresto on hold.  4 history of CVA.  5 Renal lesion-Per primary care.  For questions or updates, please contact [facility] Please consult [email or web address] for contact info under        Signed, David Millers, MD  12/03/2019, 8:32 AM

## 2019-12-03 NOTE — Progress Notes (Addendum)
Pt stating pain level of7; on call MD paged to get something stronger than tylenol. Awaiting new orders.  Received new order for Tramadol 50mg .

## 2019-12-03 NOTE — Evaluation (Signed)
Physical Therapy Evaluation Patient Details Name: David [name] MRN: [id number] DOB: 1966-01-24 Today's Date: 12/03/2019   History of Present Illness  David [name] is a 54 y.o. male with medical history significant of systolic heart failure with nonischemic cardiomyopathy, chronic kidney disease stage IIa, CVA and tobacco abuse. Patient presented after experiencing a syncope episode.  He was standing, moving boxes with his upper extremities, the boxes had medium weight.  While doing this he lost his consciousness, he does not recall any prodromal symptoms.  He collapsed to the floor, EMS was called and he was found diaphoretic and hypotensive with a blood pressure in the 70s systolic.  Positive laceration in his left ear.    Clinical Impression   Pt admitted with above diagnosis. Comes from home, single level apartment with level entry;  pt completely Independent with ADLs, IADLs, mobility and driving. Pt works full time as a Location manager. Pt presents with increased low back/rib pain and dizziness during transitional movements;  Pt Min A for bed mobility secondary to pain and overall min guard for remaining of movements. Able to walk the hallway, slow, gaurded steps;   Pt currently with functional limitations due to the deficits listed below (see PT Problem List). Pt will benefit from skilled PT to increase their independence and safety with mobility to allow discharge to the venue listed below.      12/03/19 1100  Vital Signs  Patient Position (if appropriate) Orthostatic Vitals  Orthostatic Lying   BP- Lying 111/63  Pulse- Lying 61  Orthostatic Sitting  BP- Sitting 112/60  Pulse- Sitting 64  Orthostatic Standing at 0 minutes  BP- Standing at 0 minutes 99/60  Pulse- Standing at 0 minutes 79  Orthostatic Standing at 3 minutes  BP- Standing at 3 minutes 100/67  Pulse- Standing at 3 minutes 79        Follow Up Recommendations Outpatient PT (for back pain)    Equipment  Recommendations  None recommended by PT (will consider cane vs rW tomorrow, anticipate no needs)    Recommendations for Other Services       Precautions / Restrictions Precautions Precautions: Other (comment) Precaution Comments: monitor orthostatic BP      Mobility  Bed Mobility Overal bed mobility: Needs Assistance Bed Mobility: Supine to Sit     Supine to sit: Min guard (without physical assist)     General bed mobility comments: Slow moving, but not needing physical assist  Transfers Overall transfer level: Needs assistance Equipment used: None Transfers: Sit to/from Stand Sit to Stand: Min guard;Min assist         General transfer comment: Very slow rise to stand due to back pain  Ambulation/Gait Ambulation/Gait assistance: Min assist;Min guard Gait Distance (Feet): 100 Feet Assistive device: IV Pole Gait Pattern/deviations: Step-through pattern;Decreased step length - right;Decreased step length - left Gait velocity: slowed   General Gait Details: Reports incr back pain with moving, and reports is worried about passing out again  [employer] Mobility    Modified Rankin (Stroke Patients Only)       Balance Overall balance assessment: Mild deficits observed, not formally tested (suspect unsteadiness due to dizziness and will improve )  Pertinent Vitals/Pain Pain Assessment: 0-10 Pain Score: 7  Pain Location: low back, left ribs Pain Descriptors / Indicators: Aching;Discomfort;Grimacing;Sore Pain Intervention(s): Monitored during session    Home Living Family/patient expects to be discharged to:: Private residence Living Arrangements: Alone Available Help at Discharge: Other (Comment) (per OT note, fiance can assist him) Type of Home: Apartment Home Access: Level entry     Home Layout: One level Home Equipment: None      Prior Function Level of Independence:  Independent         Comments: Pt completely Independent with ADLs, IADLs, mobility and driving. 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 )     Hand Dominance   Dominant Hand: Right    Extremity/Trunk Assessment   Upper Extremity Assessment Upper Extremity Assessment: Defer to OT evaluation    Lower Extremity Assessment Lower Extremity Assessment:  (Notable low back kpain associated with bil LE movement)    Cervical / Trunk Assessment Cervical / Trunk Assessment: Other exceptions Cervical / Trunk Exceptions: Low back pain; moves very slowly into stand  Communication   Communication: No difficulties  Cognition Arousal/Alertness: Awake/alert Behavior During Therapy: WFL for tasks assessed/performed Overall Cognitive Status: Within Functional Limits for tasks assessed                                        General Comments General comments (skin integrity, edema, etc.): BPs soft, but not noting a drop of 20 or more mmHg; reports mild dizziness    Exercises     Assessment/Plan    PT Assessment Patient needs continued PT services  PT Problem List Decreased strength;Decreased range of motion;Decreased activity tolerance;Decreased balance;Pain       PT Treatment Interventions DME instruction;Gait training;Stair training;Functional mobility training;Therapeutic activities;Therapeutic exercise;Balance training;Patient/family education    PT Goals (Current goals can be found in the Care Plan section)  Acute Rehab PT Goals Patient Stated Goal: pain control, feel better and go home soon PT Goal Formulation: With patient Time For Goal Achievement: 12/17/19 Potential to Achieve Goals: Good    Frequency Min 3X/week   Barriers to discharge        Co-evaluation               AM-PAC PT "6 Clicks" Mobility  Outcome Measure Help needed turning from your back to your side while in a flat bed without using  bedrails?: None Help needed moving from lying on your back to sitting on the side of a flat bed without using bedrails?: None Help needed moving to and from a bed to a chair (including a wheelchair)?: None Help needed standing up from a chair using your arms (e.g., wheelchair or bedside chair)?: A Little Help needed to walk in hospital room?: A Little Help needed climbing 3-5 steps with a railing? : A Little 6 Click Score: 21    End of Session Equipment Utilized During Treatment: Gait belt Activity Tolerance: Patient tolerated treatment well Patient left: in bed;with call bell/phone within reach;with bed alarm set Nurse Communication: Mobility status PT Visit Diagnosis: Other abnormalities of gait and mobility (R26.89)    Time: 1130-1151 PT Time Calculation (min) (ACUTE ONLY): 21 min   Charges:   PT Evaluation $PT Eval Moderate Complexity: 1 Mod          Van Clines, PT  Acute Herbalist  [phone] Office [phone]   Levi Aland 12/03/2019, 12:39 PM

## 2019-12-04 DIAGNOSIS — I959 Hypotension, unspecified: Secondary | ICD-10-CM

## 2019-12-04 DIAGNOSIS — N179 Acute kidney failure, unspecified: Secondary | ICD-10-CM

## 2019-12-04 LAB — CBC
HCT: 35.2 % — ABNORMAL LOW (ref 39.0–52.0)
Hemoglobin: 11.7 g/dL — ABNORMAL LOW (ref 13.0–17.0)
MCH: 31.8 pg (ref 26.0–34.0)
MCHC: 33.2 g/dL (ref 30.0–36.0)
MCV: 95.7 fL (ref 80.0–100.0)
Platelets: 186 10*3/uL (ref 150–400)
RBC: 3.68 MIL/uL — ABNORMAL LOW (ref 4.22–5.81)
RDW: 11.4 % — ABNORMAL LOW (ref 11.5–15.5)
WBC: 5 10*3/uL (ref 4.0–10.5)
nRBC: 0 % (ref 0.0–0.2)

## 2019-12-04 LAB — CK: Total CK: 428 U/L — ABNORMAL HIGH (ref 49–397)

## 2019-12-04 LAB — BASIC METABOLIC PANEL
Anion gap: 5 (ref 5–15)
BUN: 13 mg/dL (ref 6–20)
CO2: 29 mmol/L (ref 22–32)
Calcium: 8.2 mg/dL — ABNORMAL LOW (ref 8.9–10.3)
Chloride: 104 mmol/L (ref 98–111)
Creatinine, Ser: 1.19 mg/dL (ref 0.61–1.24)
GFR calc Af Amer: >60 mL/min (ref >60)
GFR calc non Af Amer: >60 mL/min (ref >60)
Glucose, Bld: 107 mg/dL — ABNORMAL HIGH (ref 70–99)
Potassium: 3.4 mmol/L — ABNORMAL LOW (ref 3.5–5.1)
Sodium: 138 mmol/L (ref 135–145)

## 2019-12-04 MED ORDER — METOPROLOL SUCCINATE ER 25 MG PO TB24: 12.5000 mg | ORAL_TABLET | Freq: Every day | ORAL | 0 refills | Status: DC

## 2019-12-04 MED ORDER — PANTOPRAZOLE SODIUM 40 MG PO TBEC: 40.0000 mg | DELAYED_RELEASE_TABLET | Freq: Every day | ORAL | 0 refills | Status: DC

## 2019-12-04 MED ORDER — POTASSIUM CHLORIDE CRYS ER 20 MEQ PO TBCR
40.0000 meq | EXTENDED_RELEASE_TABLET | Freq: Once | ORAL | Status: AC
  Administered 2019-12-04: 40 meq via ORAL
  Filled 2019-12-04: qty 2

## 2019-12-04 MED ORDER — METOPROLOL SUCCINATE ER 25 MG PO TB24
12.5000 mg | ORAL_TABLET | Freq: Every day | ORAL | Status: DC
  Administered 2019-12-04: 12.5 mg via ORAL
  Filled 2019-12-04: qty 1

## 2019-12-04 NOTE — Care Management (Signed)
Spoke to [employer] ( [employer] with patient's job). He has filed a claim. Mr Corliss Blacker will call their Workers Comp company to get a contact person for needs. Await call back.   Ronny Flurry RN

## 2019-12-04 NOTE — Discharge Summary (Signed)
Physician Discharge Summary  David [name] JOA:[id number] DOB: 1965-08-03 DOA: 12/02/2019  PCP: Janeece Agee, NP  Admit date: 12/02/2019 Discharge date: 12/04/2019  Admitted From: home Discharge disposition: home   Recommendations for Outpatient Follow-Up:   1. Outpatient PT 2. Outpatient renal U/S 3. Referral to GI for EGD if continues to have GERD symptoms   Discharge Diagnosis:   Principal Problem:   Syncope Active Problems:   Chronic combined systolic and diastolic CHF (congestive heart failure) (HCC)   Nonischemic cardiomyopathy (HCC)   CVA (cerebral vascular accident) (HCC)   PFO (patent foramen ovale)   CKD stage G2/A2, GFR 60-89 and albumin creatinine ratio 30-299 mg/g   Tobacco abuse   Hypotension    Discharge Condition: Improved.  Diet recommendation: Low sodium, heart healthy  Wound care: None.  Code status: Full.   History of Present Illness:   David [name] is a 54 y.o. male with medical history significant of systolic heart failure with nonischemic cardiomyopathy, chronic kidney disease stage IIa, CVA and tobacco abuse.   Patient presented after experiencing a syncope episode.  He was standing, moving boxes with his upper extremities, the boxes had medium weight.  While doing this he lost his consciousness, he does not recall any prodromal symptoms.  He collapsed to the floor, EMS was called and he was found diaphoretic and hypotensive with a blood pressure in the 70s systolic.    Hospital Course by Problem:   syncope-event was orthostatic mediated.  Nausea and vomiting have resolved and his symptoms have improved with hydration.  No arrhythmias noted on telemetry.  No plans for further evaluation at this time.    cardiomyopathy-nonischemic in etiology.  -Recent coronary CTA showed no coronary disease.   - Toprol 12.5 mg daily  -do not resume Entresto or carvedilol at this point.    Cardiology recommends considering adding an ARB as  an outpatient if his blood pressure allows.  acute on chronic stage II kidney disease-felt secondary to dehydration. -  Renal function  improved following hydration.  Renal lesion - will need outpatient renal ultrasound  Rib fractures Mildly displaced fractures are seen involving the left tenth, eleventh and twelfth ribs. -incentive spirometry  Hypokalemia -repleted  Medical Consultants:   Cardiology   Discharge Exam:   Vitals:   12/04/19 1015 12/04/19 1244  BP: (!) 102/57 105/60  Pulse: 66 67  Resp:  18  Temp:  98.5 F (36.9 C)  SpO2:  98%   Vitals:   12/04/19 0023 12/04/19 0522 12/04/19 1015 12/04/19 1244  BP: 113/61 113/62 (!) 102/57 105/60  Pulse: 60 62 66 67  Resp: 18 18  18   Temp: 98.4 F (36.9 C) 98.6 F (37 C)  98.5 F (36.9 C)  TempSrc: Oral Oral  Oral  SpO2: 97% 98%  98%  Weight:  72.9 kg    Height:        General exam: Appears calm and comfortable.   The results of significant diagnostics from this hospitalization (including imaging, microbiology, ancillary and laboratory) are listed below for reference.     Procedures and Diagnostic Studies:   CT Abdomen Pelvis Wo Contrast  Result Date: 12/02/2019 CLINICAL DATA:  Unwitnessed fall. EXAM: CT ABDOMEN AND PELVIS WITHOUT CONTRAST TECHNIQUE: Multidetector CT imaging of the abdomen and pelvis was performed following the standard protocol without IV contrast. COMPARISON:  November 22, 2019.  January 02, 2018. FINDINGS: Lower chest: Minimal bilateral posterior basilar subsegmental atelectasis is noted. Hepatobiliary: No  focal liver abnormality is seen. No gallstones, gallbladder wall thickening, or biliary dilatation. Pancreas: Unremarkable. No pancreatic ductal dilatation or surrounding inflammatory changes. Spleen: Normal in size without focal abnormality. Adrenals/Urinary Tract: Adrenal glands appear normal. No renal or ureteral calculi are noted. No hydronephrosis or renal obstruction is noted. Urinary  bladder is unremarkable. 1.6 cm rounded echogenic focus is noted in lower pole of right kidney which corresponds in location to cyst seen on prior exam of 2019. Stomach/Bowel: Stomach is within normal limits. Appendix appears normal. No evidence of bowel wall thickening, distention, or inflammatory changes. Vascular/Lymphatic: No significant vascular findings are present. No enlarged abdominal or pelvic lymph nodes. Reproductive: Prostate is unremarkable. Other: No abdominal wall hernia or abnormality. No abdominopelvic ascites. Musculoskeletal: Mildly displaced fractures are seen involving the left tenth, eleventh and twelfth ribs. IMPRESSION: 1. Mildly displaced fractures are seen involving the left tenth, eleventh and twelfth ribs. 2. 1.6 cm rounded echogenic focus is noted in lower pole of right kidney which corresponds in location to cyst seen on prior exam of 2019. This most likely represents hyperdense cyst, but renal ultrasound is recommended for confirmation and to rule out mass. Electronically Signed   By: Lupita Raider M.D.   On: 12/02/2019 13:25   CT Head Wo Contrast  Result Date: 12/02/2019 CLINICAL DATA:  Syncope, hypotension, altered mental status. Additional provided: Unwitnessed fall, positive loss of consciousness, back pain. EXAM: CT HEAD WITHOUT CONTRAST TECHNIQUE: Contiguous axial images were obtained from the base of the skull through the vertex without intravenous contrast. COMPARISON:  Brain MRI 08/10/2019, CT angiogram head/neck and non-contrast CT head 08/09/2019 FINDINGS: Brain: Redemonstrated, now chronic, infarct within the mid left frontal lobe and left frontal operculum. There is no acute intracranial hemorrhage. No acute demarcated cortical infarct is identified. No extra-axial fluid collection. No evidence of intracranial mass. No midline shift. Vascular: No hyperdense vessel. Skull: Normal. Negative for fracture or focal lesion. Sinuses/Orbits: Visualized orbits show no acute  finding. Complete opacification of the left maxillary sinus at the imaged levels consistent with known chronic left maxillary sinusitis. Otherwise mild scattered paranasal sinus mucosal thickening, most notably ethmoid. No significant mastoid effusion. Other: Interval decrease in size of a partially calcified cutaneous/subcutaneous mass along the lateral aspect of the left temporal bone and involving the external ear, now measuring 4.9 x 1.8 cm in transaxial dimensions. IMPRESSION: No CT evidence of acute intracranial abnormality. Redemonstrated cortically based infarct within the mid left frontal lobe and left frontal operculum, now chronic. Paranasal sinus disease as described, most notably left maxillary. Interval decrease in size of a partially calcified cutaneous/subcutaneous mass along the lateral aspect of the left temporal bone and involving the left external ear, now 4.9 x 1.8 cm. Clinical correlation is recommended. Electronically Signed   By: Jackey Loge DO   On: 12/02/2019 13:27   DG Chest Portable 1 View  Result Date: 12/02/2019 CLINICAL DATA:  54 year old male with history of trauma from a fall. Hypotension. EXAM: PORTABLE CHEST 1 VIEW COMPARISON:  No priors. FINDINGS: Lung volumes are normal. No consolidative airspace disease. No pleural effusions. No pneumothorax. No pulmonary nodule or mass noted. Pulmonary vasculature and the cardiomediastinal silhouette are within normal limits. IMPRESSION: No radiographic evidence of acute cardiopulmonary disease. Electronically Signed   By: Trudie Reed M.D.   On: 12/02/2019 11:19   ECHOCARDIOGRAM COMPLETE  Result Date: 12/03/2019    ECHOCARDIOGRAM REPORT   Patient Name:   David [name] [facility] Date of Exam: 12/03/2019 Medical Rec #:  [id number]       Height:       71.0 in Accession #:    [id number]      Weight:       157.8 lb Date of Birth:  [birth year]       BSA:          1.907 m Patient Age:    53 years        BP:           105/64 mmHg Patient Gender: M                HR:           55 bpm. Exam Location:  Inpatient Procedure: 2D Echo Indications:    syncope 780.2  History:        Patient has no prior history of Echocardiogram examinations. PFO                 and CHF; chronic kidney disease. history of stroke.  Sonographer:    Delcie Roch Referring Phys: [id number] CAROLE N HALL IMPRESSIONS  1. Left ventricular ejection fraction, by estimation, is 35 to 40%. The left ventricle has moderately decreased function. The left ventricle demonstrates global hypokinesis. The left ventricular internal cavity size was mildly dilated. Left ventricular diastolic parameters are contradictory, but mostly consistent with Grade II diastolic dysfunction (pseudonormalization). Elevated left atrial pressure.  2. Right ventricular systolic function is normal. The right ventricular size is normal. There is normal pulmonary artery systolic pressure.  3. Left atrial size was mild to moderately dilated.  4. The mitral valve is normal in structure. Mild mitral valve regurgitation.  5. The aortic valve is normal in structure. Aortic valve regurgitation is not visualized. FINDINGS  Left Ventricle: Left ventricular ejection fraction, by estimation, is 35 to 40%. The left ventricle has moderately decreased function. The left ventricle demonstrates global hypokinesis. The left ventricular internal cavity size was mildly dilated. There is no left ventricular hypertrophy. Left ventricular diastolic parameters are consistent with Grade II diastolic dysfunction (pseudonormalization). Elevated left atrial pressure. Remarkably high e' annular diastolic velocities. All other indicators  are consistent with poor LV performance. Tei index is 0.79. Right Ventricle: The right ventricular size is normal. No increase in right ventricular wall thickness. Right ventricular systolic function is normal. There is normal pulmonary artery systolic pressure. The tricuspid regurgitant velocity is 2.52 m/s, and  with  an assumed right atrial pressure of 3 mmHg, the estimated right ventricular systolic pressure is 28.4 mmHg. Left Atrium: Left atrial size was mild to moderately dilated. Right Atrium: Right atrial size was normal in size. Pericardium: There is no evidence of pericardial effusion. Mitral Valve: The mitral valve is normal in structure. Mild mitral valve regurgitation, with centrally-directed jet. Tricuspid Valve: The tricuspid valve is normal in structure. Tricuspid valve regurgitation is trivial. Aortic Valve: The aortic valve is normal in structure. Aortic valve regurgitation is not visualized. Pulmonic Valve: The pulmonic valve was normal in structure. Pulmonic valve regurgitation is not visualized. Aorta: The aortic root is normal in size and structure. IAS/Shunts: No atrial level shunt detected by color flow Doppler.  LEFT VENTRICLE PLAX 2D LVIDd:         5.90 cm      Diastology LVIDs:         5.10 cm      LV e' lateral:   15.10 cm/s LV PW:         1.00 cm  LV E/e' lateral: 5.3 LV IVS:        0.90 cm      LV e' medial:    9.36 cm/s LVOT diam:     1.70 cm      LV E/e' medial:  8.6 LV SV:         52 LV SV Index:   27 LVOT Area:     2.27 cm  LV Volumes (MOD) LV vol d, MOD A2C: 174.0 ml LV vol d, MOD A4C: 148.0 ml LV vol s, MOD A2C: 111.0 ml LV vol s, MOD A4C: 82.4 ml LV SV MOD A2C:     63.0 ml LV SV MOD A4C:     65.6 ml RIGHT VENTRICLE             IVC RV S prime:     14.00 cm/s  IVC diam: 1.40 cm TAPSE (M-mode): 2.1 cm LEFT ATRIUM             Index       RIGHT ATRIUM           Index LA diam:        4.10 cm 2.15 cm/m  RA Area:     16.80 cm LA Vol (A2C):   71.8 ml 37.66 ml/m RA Volume:   42.80 ml  22.45 ml/m LA Vol (A4C):   55.4 ml 29.05 ml/m LA Biplane Vol: 66.0 ml 34.61 ml/m  AORTIC VALVE LVOT Vmax:   112.00 cm/s LVOT Vmean:  78.000 cm/s LVOT VTI:    0.228 m  AORTA Ao Root diam: 3.00 cm MITRAL VALVE               TRICUSPID VALVE MV Area (PHT): 3.31 cm    TR Peak grad:   25.4 mmHg MV Decel Time: 229 msec     TR Vmax:        252.00 cm/s MV E velocity: 80.60 cm/s MV A velocity: 68.10 cm/s  SHUNTS MV E/A ratio:  1.18        Systemic VTI:  0.23 m                            Systemic Diam: 1.70 cm Mihai Croitoru MD Electronically signed by Thurmon Fair MD Signature Date/Time: 12/03/2019/4:41:34 PM    Final      Labs:   Basic Metabolic Panel: Recent Labs  Lab 12/02/19 1113 12/02/19 1113 12/02/19 1115 12/02/19 1115 12/02/19 2313 12/03/19 0624 12/04/19 0648  NA 136  --  137  --   --  135 138  K 5.4*   < > 4.2   < >  --  3.7 3.4*  CL 94*  --  95*  --   --  101 104  CO2 31  --   --   --   --  25 29  GLUCOSE 128*  --  124*  --   --  100* 107*  BUN 22*  --  26*  --   --  17 13  CREATININE 2.48*  --  2.30*  --  1.43* 1.32* 1.19  CALCIUM 9.1  --   --   --   --  8.0* 8.2*   < > = values in this interval not displayed.   GFR Estimated Creatinine Clearance: 72.3 mL/min (by C-G formula based on SCr of 1.19 mg/dL). Liver Function Tests: Recent Labs  Lab 12/02/19 1113 12/03/19 0624  AST 30 24  ALT 21 14  ALKPHOS 83 66  BILITOT 1.8* 1.2  PROT 7.1 5.7*  ALBUMIN 3.9 2.9*   No results for input(s): LIPASE, AMYLASE in the last 168 hours. No results for input(s): AMMONIA in the last 168 hours. Coagulation profile Recent Labs  Lab 12/02/19 1113  INR 1.1    CBC: Recent Labs  Lab 12/02/19 1113 12/02/19 1115 12/04/19 0648  WBC 9.1  --  5.0  NEUTROABS 5.1  --   --   HGB 14.8 15.3 11.7*  HCT 44.9 45.0 35.2*  MCV 95.7  --  95.7  PLT 230  --  186   Cardiac Enzymes: Recent Labs  Lab 12/03/19 0624 12/04/19 0648  CKTOTAL 513* 428*   BNP: Invalid input(s): POCBNP CBG: No results for input(s): GLUCAP in the last 168 hours. D-Dimer No results for input(s): DDIMER in the last 72 hours. Hgb A1c No results for input(s): HGBA1C in the last 72 hours. Lipid Profile No results for input(s): CHOL, HDL, LDLCALC, TRIG, CHOLHDL, LDLDIRECT in the last 72 hours. Thyroid function studies No  results for input(s): TSH, T4TOTAL, T3FREE, THYROIDAB in the last 72 hours.  Invalid input(s): FREET3 Anemia work up No results for input(s): VITAMINB12, FOLATE, FERRITIN, TIBC, IRON, RETICCTPCT in the last 72 hours. Microbiology Recent Results (from the past 240 hour(s))  SARS Coronavirus 2 by RT PCR (hospital order, performed in [facility] hospital lab) Nasopharyngeal Nasopharyngeal Swab     Status: None   Collection Time: 12/02/19  1:07 PM   Specimen: Nasopharyngeal Swab  Result Value Ref Range Status   SARS Coronavirus 2 NEGATIVE NEGATIVE Final    Comment: (NOTE) SARS-CoV-2 target nucleic acids are NOT DETECTED.  The SARS-CoV-2 RNA is generally detectable in upper and lower respiratory specimens during the acute phase of infection. The lowest concentration of SARS-CoV-2 viral copies this assay can detect is 250 copies / mL. A negative result does not preclude SARS-CoV-2 infection and should not be used as the sole basis for treatment or other patient management decisions.  A negative result may occur with improper specimen collection / handling, submission of specimen other than nasopharyngeal swab, presence of viral mutation(s) within the areas targeted by this assay, and inadequate number of viral copies (<250 copies / mL). A negative result must be combined with clinical observations, patient history, and epidemiological information.  Fact Sheet for Patients:   BoilerBrush.com.cy  Fact Sheet for Healthcare Providers: https://pope.com/  This test is not yet approved or  cleared by the [place] FDA and has been authorized for detection and/or diagnosis of SARS-CoV-2 by FDA under an Emergency Use Authorization (EUA).  This EUA will remain in effect (meaning this test can be used) for the duration of the COVID-19 declaration under Section 564(b)(1) of the Act, 21 U.S.C. section 360bbb-3(b)(1), unless the authorization is  terminated or revoked sooner.  Performed at [facility], [address] [place], [place] [address]      Discharge Instructions:   Discharge Instructions    Ambulatory referral to Physical Therapy   Complete by: As directed    Iontophoresis - 4 mg/ml of dexamethasone: No   T.E.N.S. Unit Evaluation and Dispense as Indicated: No     Allergies as of 12/04/2019   No Known Allergies     Medication List    STOP taking these medications   carvedilol 6.25 MG tablet Commonly known as: COREG   Entresto 24-26 MG Generic drug: sacubitril-valsartan     TAKE these medications  aspirin EC 81 MG tablet Take 81 mg by mouth 2 (two) times daily. Swallow whole.   atorvastatin 80 MG tablet Commonly known as: LIPITOR Take 80 mg by mouth daily.   metoprolol succinate 25 MG 24 hr tablet Commonly known as: TOPROL-XL Take 0.5 tablets (12.5 mg total) by mouth daily. Start taking on: December 05, 2019   pantoprazole 40 MG tablet Commonly known as: PROTONIX Take 1 tablet (40 mg total) by mouth daily.       Follow-up Information    [facility] Follow up.   Specialty: Rehabilitation Contact information: [address] [id number] mc [place] [address] [phone]               Time coordinating discharge: 35 min  Signed:  Joseph Art DO  [facility] 12/04/2019, 1:02 PM

## 2019-12-04 NOTE — Progress Notes (Signed)
Progress Note  Patient Name: David [name] Date of Encounter: 12/04/2019  [facility] Cardiologist: Olga Millers, MD   Subjective   Pt denies dyspnea, CP or dizziness  Inpatient Medications    Scheduled Meds: . aspirin EC  81 mg Oral BID  . atorvastatin  80 mg Oral Daily  . heparin  5,000 Units Subcutaneous Q8H  . pantoprazole  40 mg Oral BID  . sucralfate  1 g Oral TID WC & HS   Continuous Infusions:  PRN Meds: acetaminophen **OR** acetaminophen, ondansetron **OR** ondansetron (ZOFRAN) IV   Vital Signs    Vitals:   12/03/19 0527 12/03/19 1829 12/04/19 0023 12/04/19 0522  BP: 105/64 (!) 101/58 113/61 113/62  Pulse: 60 62 60 62  Resp: 20 20 18 18   Temp: 98.4 F (36.9 C) 98.1 F (36.7 C) 98.4 F (36.9 C) 98.6 F (37 C)  TempSrc: Oral Oral Oral Oral  SpO2: 97%  97% 98%  Weight:    72.9 kg  Height:        Intake/Output Summary (Last 24 hours) at 12/04/2019 0817 Last data filed at 12/04/2019 0755 Gross per 24 hour  Intake 352 ml  Output 600 ml  Net -248 ml   Last 3 Weights 12/04/2019 12/03/2019 12/02/2019  Weight (lbs) 160 lb 12.8 oz 157 lb 13.6 oz 160 lb  Weight (kg) 72.938 kg 71.6 kg 72.576 kg      Telemetry    Sinus  Physical Exam   GEN: WD NAD Neck: No JVD, supple Cardiac: RRR Respiratory: CTA GI: Soft, NT/ND MS: No edema Neuro:  Grossly intact Psych: Normal affect   Labs    High Sensitivity Troponin:   Recent Labs  Lab 12/02/19 1113 12/02/19 1323  TROPONINIHS 7 8      Chemistry Recent Labs  Lab 12/02/19 1113 12/02/19 1113 12/02/19 1115 12/02/19 2313 12/03/19 0624 12/04/19 0648  NA 136   < > 137  --  135 138  K 5.4*   < > 4.2  --  3.7 3.4*  CL 94*   < > 95*  --  101 104  CO2 31  --   --   --  25 29  GLUCOSE 128*   < > 124*  --  100* 107*  BUN 22*   < > 26*  --  17 13  CREATININE 2.48*   < > 2.30* 1.43* 1.32* 1.19  CALCIUM 9.1  --   --   --  8.0* 8.2*  PROT 7.1  --   --   --  5.7*  --   ALBUMIN 3.9  --   --   --   2.9*  --   AST 30  --   --   --  24  --   ALT 21  --   --   --  14  --   ALKPHOS 83  --   --   --  66  --   BILITOT 1.8*  --   --   --  1.2  --   GFRNONAA 28*   < >  --  55* >60 >60  GFRAA 33*   < >  --  >60 >60 >60  ANIONGAP 11  --   --   --  9 5   < > = values in this interval not displayed.     Hematology Recent Labs  Lab 12/02/19 1113 12/02/19 1115 12/04/19 0648  WBC 9.1  --  5.0  RBC  4.69  --  3.68*  HGB 14.8 15.3 11.7*  HCT 44.9 45.0 35.2*  MCV 95.7  --  95.7  MCH 31.6  --  31.8  MCHC 33.0  --  33.2  RDW 11.4*  --  11.4*  PLT 230  --  186    Radiology    CT Abdomen Pelvis Wo Contrast  Result Date: 12/02/2019 CLINICAL DATA:  Unwitnessed fall. EXAM: CT ABDOMEN AND PELVIS WITHOUT CONTRAST TECHNIQUE: Multidetector CT imaging of the abdomen and pelvis was performed following the standard protocol without IV contrast. COMPARISON:  November 22, 2019.  January 02, 2018. FINDINGS: Lower chest: Minimal bilateral posterior basilar subsegmental atelectasis is noted. Hepatobiliary: No focal liver abnormality is seen. No gallstones, gallbladder wall thickening, or biliary dilatation. Pancreas: Unremarkable. No pancreatic ductal dilatation or surrounding inflammatory changes. Spleen: Normal in size without focal abnormality. Adrenals/Urinary Tract: Adrenal glands appear normal. No renal or ureteral calculi are noted. No hydronephrosis or renal obstruction is noted. Urinary bladder is unremarkable. 1.6 cm rounded echogenic focus is noted in lower pole of right kidney which corresponds in location to cyst seen on prior exam of 2019. Stomach/Bowel: Stomach is within normal limits. Appendix appears normal. No evidence of bowel wall thickening, distention, or inflammatory changes. Vascular/Lymphatic: No significant vascular findings are present. No enlarged abdominal or pelvic lymph nodes. Reproductive: Prostate is unremarkable. Other: No abdominal wall hernia or abnormality. No abdominopelvic ascites.  Musculoskeletal: Mildly displaced fractures are seen involving the left tenth, eleventh and twelfth ribs. IMPRESSION: 1. Mildly displaced fractures are seen involving the left tenth, eleventh and twelfth ribs. 2. 1.6 cm rounded echogenic focus is noted in lower pole of right kidney which corresponds in location to cyst seen on prior exam of 2019. This most likely represents hyperdense cyst, but renal ultrasound is recommended for confirmation and to rule out mass. Electronically Signed   By: Lupita Raider M.D.   On: 12/02/2019 13:25   CT Head Wo Contrast  Result Date: 12/02/2019 CLINICAL DATA:  Syncope, hypotension, altered mental status. Additional provided: Unwitnessed fall, positive loss of consciousness, back pain. EXAM: CT HEAD WITHOUT CONTRAST TECHNIQUE: Contiguous axial images were obtained from the base of the skull through the vertex without intravenous contrast. COMPARISON:  Brain MRI 08/10/2019, CT angiogram head/neck and non-contrast CT head 08/09/2019 FINDINGS: Brain: Redemonstrated, now chronic, infarct within the mid left frontal lobe and left frontal operculum. There is no acute intracranial hemorrhage. No acute demarcated cortical infarct is identified. No extra-axial fluid collection. No evidence of intracranial mass. No midline shift. Vascular: No hyperdense vessel. Skull: Normal. Negative for fracture or focal lesion. Sinuses/Orbits: Visualized orbits show no acute finding. Complete opacification of the left maxillary sinus at the imaged levels consistent with known chronic left maxillary sinusitis. Otherwise mild scattered paranasal sinus mucosal thickening, most notably ethmoid. No significant mastoid effusion. Other: Interval decrease in size of a partially calcified cutaneous/subcutaneous mass along the lateral aspect of the left temporal bone and involving the external ear, now measuring 4.9 x 1.8 cm in transaxial dimensions. IMPRESSION: No CT evidence of acute intracranial  abnormality. Redemonstrated cortically based infarct within the mid left frontal lobe and left frontal operculum, now chronic. Paranasal sinus disease as described, most notably left maxillary. Interval decrease in size of a partially calcified cutaneous/subcutaneous mass along the lateral aspect of the left temporal bone and involving the left external ear, now 4.9 x 1.8 cm. Clinical correlation is recommended. Electronically Signed   By: Jackey Loge DO  On: 12/02/2019 13:27   DG Chest Portable 1 View  Result Date: 12/02/2019 CLINICAL DATA:  54 year old male with history of trauma from a fall. Hypotension. EXAM: PORTABLE CHEST 1 VIEW COMPARISON:  No priors. FINDINGS: Lung volumes are normal. No consolidative airspace disease. No pleural effusions. No pneumothorax. No pulmonary nodule or mass noted. Pulmonary vasculature and the cardiomediastinal silhouette are within normal limits. IMPRESSION: No radiographic evidence of acute cardiopulmonary disease. Electronically Signed   By: Trudie Reed M.D.   On: 12/02/2019 11:19   ECHOCARDIOGRAM COMPLETE  Result Date: 12/03/2019    ECHOCARDIOGRAM REPORT   Patient Name:   David [name] Date of Exam: 12/03/2019 Medical Rec #:  [id number]       Height:       71.0 in Accession #:    [id number]      Weight:       157.8 lb Date of Birth:  09-Mar-1966       BSA:          1.907 m Patient Age:    53 years        BP:           105/64 mmHg Patient Gender: M               HR:           55 bpm. Exam Location:  Inpatient Procedure: 2D Echo Indications:    syncope 780.2  History:        Patient has no prior history of Echocardiogram examinations. PFO                 and CHF; chronic kidney disease. history of stroke.  Sonographer:    Delcie Roch Referring Phys: [id number] CAROLE N HALL IMPRESSIONS  1. Left ventricular ejection fraction, by estimation, is 35 to 40%. The left ventricle has moderately decreased function. The left ventricle demonstrates global hypokinesis. The  left ventricular internal cavity size was mildly dilated. Left ventricular diastolic parameters are contradictory, but mostly consistent with Grade II diastolic dysfunction (pseudonormalization). Elevated left atrial pressure.  2. Right ventricular systolic function is normal. The right ventricular size is normal. There is normal pulmonary artery systolic pressure.  3. Left atrial size was mild to moderately dilated.  4. The mitral valve is normal in structure. Mild mitral valve regurgitation.  5. The aortic valve is normal in structure. Aortic valve regurgitation is not visualized. FINDINGS  Left Ventricle: Left ventricular ejection fraction, by estimation, is 35 to 40%. The left ventricle has moderately decreased function. The left ventricle demonstrates global hypokinesis. The left ventricular internal cavity size was mildly dilated. There is no left ventricular hypertrophy. Left ventricular diastolic parameters are consistent with Grade II diastolic dysfunction (pseudonormalization). Elevated left atrial pressure. Remarkably high e' annular diastolic velocities. All other indicators  are consistent with poor LV performance. Tei index is 0.79. Right Ventricle: The right ventricular size is normal. No increase in right ventricular wall thickness. Right ventricular systolic function is normal. There is normal pulmonary artery systolic pressure. The tricuspid regurgitant velocity is 2.52 m/s, and  with an assumed right atrial pressure of 3 mmHg, the estimated right ventricular systolic pressure is 28.4 mmHg. Left Atrium: Left atrial size was mild to moderately dilated. Right Atrium: Right atrial size was normal in size. Pericardium: There is no evidence of pericardial effusion. Mitral Valve: The mitral valve is normal in structure. Mild mitral valve regurgitation, with centrally-directed jet. Tricuspid Valve: The tricuspid valve is normal in  structure. Tricuspid valve regurgitation is trivial. Aortic Valve: The  aortic valve is normal in structure. Aortic valve regurgitation is not visualized. Pulmonic Valve: The pulmonic valve was normal in structure. Pulmonic valve regurgitation is not visualized. Aorta: The aortic root is normal in size and structure. IAS/Shunts: No atrial level shunt detected by color flow Doppler.  LEFT VENTRICLE PLAX 2D LVIDd:         5.90 cm      Diastology LVIDs:         5.10 cm      LV e' lateral:   15.10 cm/s LV PW:         1.00 cm      LV E/e' lateral: 5.3 LV IVS:        0.90 cm      LV e' medial:    9.36 cm/s LVOT diam:     1.70 cm      LV E/e' medial:  8.6 LV SV:         52 LV SV Index:   27 LVOT Area:     2.27 cm  LV Volumes (MOD) LV vol d, MOD A2C: 174.0 ml LV vol d, MOD A4C: 148.0 ml LV vol s, MOD A2C: 111.0 ml LV vol s, MOD A4C: 82.4 ml LV SV MOD A2C:     63.0 ml LV SV MOD A4C:     65.6 ml RIGHT VENTRICLE             IVC RV S prime:     14.00 cm/s  IVC diam: 1.40 cm TAPSE (M-mode): 2.1 cm LEFT ATRIUM             Index       RIGHT ATRIUM           Index LA diam:        4.10 cm 2.15 cm/m  RA Area:     16.80 cm LA Vol (A2C):   71.8 ml 37.66 ml/m RA Volume:   42.80 ml  22.45 ml/m LA Vol (A4C):   55.4 ml 29.05 ml/m LA Biplane Vol: 66.0 ml 34.61 ml/m  AORTIC VALVE LVOT Vmax:   112.00 cm/s LVOT Vmean:  78.000 cm/s LVOT VTI:    0.228 m  AORTA Ao Root diam: 3.00 cm MITRAL VALVE               TRICUSPID VALVE MV Area (PHT): 3.31 cm    TR Peak grad:   25.4 mmHg MV Decel Time: 229 msec    TR Vmax:        252.00 cm/s MV E velocity: 80.60 cm/s MV A velocity: 68.10 cm/s  SHUNTS MV E/A ratio:  1.18        Systemic VTI:  0.23 m                            Systemic Diam: 1.70 cm Rachelle Hora Croitoru MD Electronically signed by Thurmon Fair MD Signature Date/Time: 12/03/2019/4:41:34 PM    Final     Patient Profile     54 year old male with past medical history of chronic combined systolic/diastolic congestive heart failure, prior CVA, chronic stage II-III kidney disease for evaluation of syncope.   Patient previously admitted with CVA and echocardiogram showed severely reduced LV function, moderate mitral regurgitation and mild left atrial enlargement.  Cardiac CTA revealed calcium score of 0 and no coronary disease.  Patient treated medically and follow-up echocardiogram June 2021 showed  ejection fraction 40 to 45%, mild mitral regurgitation.  Patient admitted with nausea and vomiting and syncope.  Patient's echocardiogram was repeated this admission and shows ejection fraction 35 to 40%, mild left ventricular enlargement, grade 2 diastolic dysfunction, mild to moderate left atrial enlargement, mild mitral regurgitation.  Assessment & Plan    1 syncope-event was orthostatic mediated.  Nausea and vomiting have resolved and his symptoms have improved with hydration.  No arrhythmias noted on telemetry.  No plans for further evaluation at this time.    2 cardiomyopathy-nonischemic in etiology.  Recent coronary CTA showed no coronary disease.  Blood pressure remains borderline.  I will add Toprol 12.5 mg daily but we will not resume Entresto or carvedilol at this point.  We will consider adding an ARB as an outpatient if his blood pressure allows.  3 acute on chronic stage II kidney disease-felt secondary to dehydration.  Renal function is improved following hydration.  4 history of CVA.  5 Renal lesion-Per primary care.  Patient can be discharged from a cardiac standpoint.  He has a follow-up appointment with me in July.  We will consider adding low-dose ARB at that time if his blood pressure improves.  Would not resume carvedilol or Entresto.  We will sign off.  Please call with questions.  For questions or updates, please contact [facility] Please consult [email or web address] for contact info under        Signed, Olga Millers, MD  12/04/2019, 8:17 AM

## 2019-12-04 NOTE — Progress Notes (Signed)
Physical Therapy Treatment Patient Details Name: David [name] MRN: [id number] DOB: 1966/03/04 Today's Date: 12/04/2019    History of Present Illness David [name] is a 54 y.o. male with medical history significant of systolic heart failure with nonischemic cardiomyopathy, chronic kidney disease stage IIa, CVA and tobacco abuse. Patient presented after experiencing a syncope episode.  He was standing, moving boxes with his upper extremities, the boxes had medium weight.  While doing this he lost his consciousness, he does not recall any prodromal symptoms.  He collapsed to the floor, EMS was called and he was found diaphoretic and hypotensive with a blood pressure in the 70s systolic.  Positive laceration in his left ear.      PT Comments    Pt making steady progress with functional mobility. He continues to prefer one UE support of IV pole and stated he feels a cane would be beneficial for him and for back pain relief. Continue to recommend OPPT f/u services to further address his back pain. Pt would continue to benefit from skilled physical therapy services at this time while admitted and after d/c to address the below listed limitations in order to improve overall safety and independence with functional mobility.    Follow Up Recommendations  Outpatient PT;Other (comment) (for back pain)     Equipment Recommendations  Cane    Recommendations for Other Services       Precautions / Restrictions Precautions Precautions: Other (comment) Precaution Comments: monitor orthostatic BP Restrictions Weight Bearing Restrictions: No    Mobility  Bed Mobility Overal bed mobility: Needs Assistance Bed Mobility: Supine to Sit     Supine to sit: Supervision     General bed mobility comments: very slowly moving into sitting from supine position, no physical assistance needed  Transfers Overall transfer level: Needs assistance Equipment used: None Transfers: Sit to/from Stand Sit to  Stand: Min assist;From elevated surface         General transfer comment: very slow with rise into standing and min A needed to power up and for stability  Ambulation/Gait Ambulation/Gait assistance: Min guard Gait Distance (Feet): 100 Feet Assistive device: IV Pole Gait Pattern/deviations: Step-through pattern;Decreased step length - right;Decreased step length - left;Decreased stride length Gait velocity: decreased   General Gait Details: intermittently stopping secondary to back pain. Pt preferring to ambulate with use of IV pole for support, no instability or LOB, no need for physical assistance   Stairs             Wheelchair Mobility    Modified Rankin (Stroke Patients Only)       Balance Overall balance assessment: Needs assistance Sitting-balance support: Feet supported Sitting balance-Leahy Scale: Good     Standing balance support: During functional activity Standing balance-Leahy Scale: Fair                              Cognition Arousal/Alertness: Awake/alert Behavior During Therapy: WFL for tasks assessed/performed Overall Cognitive Status: Within Functional Limits for tasks assessed                                        Exercises      General Comments        Pertinent Vitals/Pain Pain Assessment: Faces Faces Pain Scale: Hurts even more Pain Location: low back, L flank Pain Descriptors / Indicators: Aching;Discomfort;Grimacing;Sore  Pain Intervention(s): Monitored during session;Repositioned    Home Living                      Prior Function            PT Goals (current goals can now be found in the care plan section) Acute Rehab PT Goals PT Goal Formulation: With patient Time For Goal Achievement: 12/17/19 Potential to Achieve Goals: Good Progress towards PT goals: Progressing toward goals    Frequency    Min 3X/week      PT Plan Current plan remains appropriate    Co-evaluation               AM-PAC PT "6 Clicks" Mobility   Outcome Measure  Help needed turning from your back to your side while in a flat bed without using bedrails?: None Help needed moving from lying on your back to sitting on the side of a flat bed without using bedrails?: None Help needed moving to and from a bed to a chair (including a wheelchair)?: None Help needed standing up from a chair using your arms (e.g., wheelchair or bedside chair)?: A Little Help needed to walk in hospital room?: A Little Help needed climbing 3-5 steps with a railing? : A Little 6 Click Score: 21    End of Session   Activity Tolerance: Patient tolerated treatment well Patient left: in bed;with call bell/phone within reach Nurse Communication: Mobility status PT Visit Diagnosis: Other abnormalities of gait and mobility (R26.89)     Time: 7939-0300 PT Time Calculation (min) (ACUTE ONLY): 20 min  Charges:  $Gait Training: 8-22 mins                     Arletta Bale, DPT  Acute Rehabilitation Services Pager [phone] Office [phone]     David [name] 12/04/2019, 11:34 AM

## 2019-12-04 NOTE — TOC CAGE-AID Note (Signed)
Transition of Care [facility]) - CAGE-AID Screening   Patient Details  Name: David [name] MRN: [id number] Date of Birth: [birth year]  Transition of Care [facility]) CM/SW Contact:    Emeterio Reeve, [place] Phone Number: 12/04/2019, 5:05 PM   Clinical Narrative:  CSW met with pt at bedside. CSW introduced self and explained her role at the hospital. Pt denied current alcohol and substance use.    CAGE-AID Screening:    Have You Ever Felt You Ought to Cut Down on Your Drinking or Drug Use?: No Have People Annoyed You By Critizing Your Drinking Or Drug Use?: No Have You Felt Bad Or Guilty About Your Drinking Or Drug Use?: No Have You Ever Had a Drink or Used Drugs First Thing In The Morning to STeady Your Nerves or to Get Rid of a Hangover?: No CAGE-AID Score: 0  Substance Abuse Education Offered: Yes     Blima Ledger, [place] Social Worker [phone]

## 2019-12-04 NOTE — Progress Notes (Signed)
Nsg Discharge Note  Admit Date:  12/02/2019 Discharge date: 12/04/2019   David [name] to be D/C'd Home per MD order.  AVS completed.  Copy for chart, and copy for patient signed, and dated. Patient/caregiver able to verbalize understanding.  Discharge Medication: Allergies as of 12/04/2019   No Known Allergies     Medication List    STOP taking these medications   carvedilol 6.25 MG tablet Commonly known as: COREG   Entresto 24-26 MG Generic drug: sacubitril-valsartan     TAKE these medications   aspirin EC 81 MG tablet Take 81 mg by mouth 2 (two) times daily. Swallow whole.   atorvastatin 80 MG tablet Commonly known as: LIPITOR Take 80 mg by mouth daily.   metoprolol succinate 25 MG 24 hr tablet Commonly known as: TOPROL-XL Take 0.5 tablets (12.5 mg total) by mouth daily. Start taking on: December 05, 2019   pantoprazole 40 MG tablet Commonly known as: PROTONIX Take 1 tablet (40 mg total) by mouth daily.       Discharge Assessment: Vitals:   12/04/19 1015 12/04/19 1244  BP: (!) 102/57 105/60  Pulse: 66 67  Resp:  18  Temp:  98.5 F (36.9 C)  SpO2:  98%   Skin clean, dry and intact without evidence of skin break down, no evidence of skin tears noted. IV catheter discontinued intact. Site without signs and symptoms of complications - no redness or edema noted at insertion site, patient denies c/o pain - only slight tenderness at site.  Dressing with slight pressure applied.  D/c Instructions-Education: Discharge instructions given to patient/family with verbalized understanding. D/c education completed with patient/family including follow up instructions, medication list, d/c activities limitations if indicated, with other d/c instructions as indicated by MD - patient able to verbalize understanding, all questions fully answered. Patient instructed to return to ED, call 911, or call MD for any changes in condition.  Patient escorted via WC, and D/C home via  private auto.  Boykin Nearing, RN 12/04/2019 4:25 PM

## 2019-12-05 ENCOUNTER — Encounter: Payer: Self-pay | Admitting: General Practice

## 2019-12-05 ENCOUNTER — Telehealth: Payer: Self-pay | Admitting: General Practice

## 2019-12-05 MED ORDER — PANTOPRAZOLE SODIUM 40 MG PO TBEC
40.0000 mg | DELAYED_RELEASE_TABLET | Freq: Every day | ORAL | 0 refills | Status: DC
Start: 2019-12-05 — End: 2019-12-26

## 2019-12-05 MED ORDER — METOPROLOL TARTRATE 25 MG PO TABS
12.5000 mg | ORAL_TABLET | Freq: Two times a day (BID) | ORAL | 0 refills | Status: DC
Start: 2019-12-05 — End: 2019-12-26

## 2019-12-05 NOTE — Telephone Encounter (Signed)
Pt calling today to discuss medication changes during his last hospital admission on 6/28. Unfortunately, his records are under another MRN number for his hospital encounter. See MRN [id number].  Pt states he was lifting boxes and had an episode of syncope with no recall as to how it happened. He damaged ribs during this episode and was admitted for observation.  I have moved his follow up appointment with [name], NP to discuss medication changes and syncope. Per Dr. Benjamine Mola, discharging MD, pt was to: " STOP taking these medications   carvedilol 6.25 MG tablet Commonly known as: COREG   Entresto 24-26 MG Generic drug: sacubitril-valsartan     TAKE these medications   aspirin EC 81 MG tablet Take 81 mg by mouth 2 (two) times daily. Swallow whole.   atorvastatin 80 MG tablet Commonly known as: LIPITOR Take 80 mg by mouth daily.   metoprolol succinate 25 MG 24 hr tablet Commonly known as: TOPROL-XL Take 0.5 tablets (12.5 mg total) by mouth daily. Start taking on: December 05, 2019   pantoprazole 40 MG tablet Commonly known as: PROTONIX Take 1 tablet (40 mg total) by mouth daily.     Pt has requested his medications be sent to a cheaper pharmacy as he does not have insurance at this time. I have refilled his request to [facility] pharmacy on [address] per Dr. Burna Sis above instructions.   A message has been sent to medical records to request a chart merge as well.   Pt has been scheduled to see [name], NP on 7/8 as an office visit.

## 2019-12-05 NOTE — Telephone Encounter (Signed)
New Message:    Please call pt about his medicine. He says he does not have it and does not have insurance in effect yet to get it.

## 2019-12-11 NOTE — Progress Notes (Signed)
Cardiology Clinic Note   Patient Name: David [name] Date of Encounter: 12/12/2019  Primary Care Provider:  Janeece Agee, NP Primary Cardiologist:  Olga Millers, MD  Patient Profile    David [name] year old male presents today for follow-up evaluation of his chronic systolic CHF and cardiomyopathy.  Past Medical History    Past Medical History:  Diagnosis Date  . Acute ischemic left MCA stroke (HCC)    a. 08/2019 MRI: Acute L MCA branch vessel infarctions; b. 08/2019 CTA head/neck: Patent bilat carotids/vertebrals. High grade stenosis - inf division of prox L MCA M2 branch. additional high-grade mid-dist superior L MCA M2 branch occlusion. 27mm aneurysm arising from cavernous L ICA.  [name] Cardiomyopathy (HCC)    a. 08/2019 Echo: EF 20-25%, global HK. Gr2 DD. Nl RV fxn. RVSP 30.21mmHg.  Mildly dil LA. Mod MR.  . Cerebral aneurysm    a. 08/2019 CTA Head: 12mm aneurysm arising from the cavernous L ICA.  [name] CHF (congestive heart failure) (HCC)   . CKD stage G2/A2, GFR 60-89 and albumin creatinine ratio 30-299 mg/g   . Facial mass    a. CT head: 7.0 x 2.3 cm soft tissue mass along lat aspect of L temporal bone, involving L ext ear - highly suscpicious for malignancy.  [name] Heart attack (HCC)   . Heart murmur   . Hypertension   . Moderate mitral regurgitation   . Nonischemic cardiomyopathy (HCC)   . PFO (patent foramen ovale)   . Stroke (HCC)   . Tobacco abuse    Past Surgical History:  Procedure Laterality Date  . BUBBLE STUDY  08/12/2019   Procedure: BUBBLE STUDY;  Surgeon: Parke Poisson, MD;  Location: [facility] ENDOSCOPY;  Service: Cardiology;;  . EXTERNAL EAR SURGERY    . TEE WITHOUT CARDIOVERSION N/A 08/12/2019   Procedure: TRANSESOPHAGEAL ECHOCARDIOGRAM (TEE);  Surgeon: Parke Poisson, MD;  Location: [facility] ENDOSCOPY;  Service: Cardiology;  Laterality: N/A;    Allergies  No Known Allergies  History of Present Illness    [name] has a PMH of acute ischemic CVA,  chronic systolic CHF, HTN, chest pain, cardiomyopathy, marijuana use and tobacco abuse.  He was seen by cardiology on 08/11/2019 as a consult for new cardiomyopathy. He had not seen a healthcare provider in many years. He works 68 hours a week and had done this for around 20 years. His work was somewhat physical and he performed tasks without physical limitations. On 3/21 he noted chest discomfort that lasted for about 15 minutes and resolve spontaneously. He mentioned this to his fiance but then did not seek medical attention for further evaluation. On 08/09/2019 he returned home from work and was noted to have right facial droop and slurred speech. He was taken by EMS to [facility], ED. A CT of his head showed no hemorrhage however, subacute infarct in the mid left frontal lobe and left frontal operculum or identified. He was mildly hypertensive at presentation. He was also noted to have a 7 x 2.3 cm soft tissue mass along the lateral aspect of his left temporal bone. This was highly suspicious for malignancy. He was admitted and underwent CTA of his head and neck which showed high-grade stenosis in the inferior division of the proximal left MCA M2 branch and high-grade stenosis in the mid to distal posterior left MCA M2 branch. A 7 mm aneurysm arising from his left ICA was also noted. An MRI showed acute left MCA branch vessel infarctions consistent with embolic  stroke.  An echocardiogram 08/10/2019 showed an EF of 20-25% with global hypokinesis and grade 2 diastolic dysfunction. Moderate mitral valve regurgitation. He denied dyspnea on exertion, PND, orthopnea, and lower extremity edema.  During his lastcardiology visit3/03/2020 he denied chest pain and indicated he was feeling slightly better. EKG showed normal sinus rhythm with PVCs and one run of nonsustained ventricular tachycardia that lasted 15 beats. TEE 08/12/2019 showed EF 20-25%, global hypokinesis, mildly dilated left atria, and  tiny patent foramen ovale. Cardiac MRI 08/13/2019 showed severely dilated left ventricle with mild concentric hypertrophy and severely decreased systolic function LVEF 20%. Mild mitral and tricuspid regurgitation. Findings suspicious for ischemic cardiomyopathy, ischemic work-up recommended. Evidence of prethrombotic state there was no evidence of clot/thrombus however it was believed that thrombus may have been cause. Patient was anticoagulated previously by neurology service. With a plan to transition losartan to [facility] as outpatient. He was given LifeVest at discharge with a plan for possible ICD in 3 months.  He presented to the clinic 11/05/19 for follow-up evaluation and stateedhe did not feel any different than he did prior to going to the hospital. He had been wearing his LifeVest as directed. He did not have any increased shortness of breath and denies chest pain. I  stopped his losartan  and started him on Entresto. I ordered BMP checked in 1 week and scheduled follow-up in 3 to 4 weeks for further evaluation.  He was seen in follow-up evaluation by Dr. Ladona Ridgel on 11/12/2019.  During that time he was doing well.  His Eliquis was stopped and he was started on 81 mg aspirin.  His follow-up echocardiogram 11/06/2019 showed an LVEF improvement to 40-45%.  He was instructed to follow-up in 1 year.  He presented to the emergency department on 11/21/2019 with complaints of coughing up/vomiting blood.  His nausea was controlled with Zofran.  CT abdomen showed no acute findings.  It was felt that his symptoms were related to gastroenteritis.  He denied dysuria or hematuria.  He was discharged in stable condition.  His creatinine at the time was 1.86.  He presented to the clinic 11/27/19 and stated his Eliquis was stopped by Dr. Ladona Ridgel and he is now taking aspirin.  He had an acute episode of hematemesis and presented to the emergency department.  He has had no more episodes of GI bleeding.  He was  back at work and doing 40 hours/week.  He stated he had some back pain  which appears to be muscular in nature.  He felt overall he had more energy and was doing well.  I did not increase his Entresto  due to his recent bump in creatinine surrounding his acute episode of GI bleeding.  I planned follow-up with him virtually in 1 month, and order a BMP  for further evaluation.  He was recently seen in the emergency department for vasovagal symptoms on 12/02/2019.  He was discharged on 12/04/2019.  He was noted to have 1 syncopal episode.  He was standing and moving boxes with his upper extremities.  Boxes were noted to be medium weight.  He lost consciousness and did not recall any prodromal symptoms.  EMS was called and he was found to be diaphoretic and hypotensive with a blood pressure in 70s.  He received IV hydration.  EKG showed no arrhythmias.  Recent CTA showed no coronary disease.  His metoprolol was continued at 12.5 and his Entresto and carvedilol were held.  His renal function improved with hydration.  He was instructed to have outpatient renal ultrasound due to a renal lesion.  He suffered a rib fracture involving the left 10th 11th and 12th ribs.  He presents to the clinic today for follow-up evaluation and states he feels well.  He has had no further episodes of syncope.  He states that his ribs continue to hurt.  He presents with his wife and his wife states that his place appointment is not allowing him to climb Workmen's Comp.  Due to his blood pressure today I will not make any medication changes.  I will have him continue to increase his physical activity slowly.  I educated about bracing ribs for pain relief with coughing sneezing, and movements.  I will have him follow-up in 1 month for reevaluation.  Today hedenies chest pain, shortness of breath, lower extremity edema, fatigue, palpitations, melena, hematuria, hemoptysis, diaphoresis, weakness, presyncope, syncope, orthopnea, and  PND.  Home Medications    Prior to Admission medications   Medication Sig Start Date End Date Taking? Authorizing Provider  aspirin EC 81 MG tablet Take 1 tablet (81 mg total) by mouth daily. 11/12/19   Marinus Maw, MD  aspirin EC 81 MG tablet Take 81 mg by mouth 2 (two) times daily. Swallow whole.    [provider]  atorvastatin (LIPITOR) 80 MG tablet TAKE 1 TABLET (80 MG TOTAL) BY MOUTH DAILY AT 6 PM. 10/07/19   Tillery, Mariam Dollar, PA-C  atorvastatin (LIPITOR) 80 MG tablet Take 80 mg by mouth daily. 10/07/19   [provider]  carvedilol (COREG) 6.25 MG tablet Take 1 tablet (6.25 mg total) by mouth 2 (two) times daily. 09/18/19   Graciella Freer, PA-C  ibuprofen (ADVIL,MOTRIN) 600 MG tablet Take 1 tablet (600 mg total) by mouth every 6 (six) hours as needed. 05/13/18   Fawze, Mina A, PA-C  metoprolol succinate (TOPROL-XL) 25 MG 24 hr tablet Take 0.5 tablets (12.5 mg total) by mouth daily. 12/05/19   Joseph Art, DO  metoprolol tartrate (LOPRESSOR) 25 MG tablet Take 0.5 tablets (12.5 mg total) by mouth 2 (two) times daily. 12/05/19 03/04/20  Joseph Art, DO  nicotine (NICODERM CQ) 21 mg/24hr patch Place 1 patch (21 mg total) onto the skin daily. 09/12/19   Janeece Agee, NP  pantoprazole (PROTONIX) 40 MG tablet Take 1 tablet (40 mg total) by mouth daily. 12/05/19   Joseph Art, DO  pantoprazole (PROTONIX) 40 MG tablet Take 1 tablet (40 mg total) by mouth daily. 12/04/19   Joseph Art, DO  sacubitril-valsartan (ENTRESTO) 24-26 MG Take 1 tablet by mouth 2 (two) times daily. 11/05/19   Ronney Asters, NP    Family History    Family History  Problem Relation Age of Onset  . Stroke Sister   . Cancer Mother        died @ 46  . Other Father        died of CO poisoning as a young man.   He indicated that his mother is deceased. He indicated that his father is deceased. He indicated that his sister is alive.  Social History    Social History    Socioeconomic History  . Marital status: Single    Spouse name: Not on file  . Number of children: 6  . Years of education: Not on file  . Highest education level: Not on file  Occupational History  . Not on file  Tobacco Use  . Smoking status: Current Every Day  Smoker    Packs/day: 0.50    Years: 35.00    Pack years: 17.50    Types: Cigarettes  . Smokeless tobacco: Never Used  Vaping Use  . Vaping Use: Never used  Substance and Sexual Activity  . Alcohol use: No  . Drug use: Not Currently    Types: Marijuana    Comment: smokes several blunts on the weekend.  . Sexual activity: Yes  Other Topics Concern  . Not on file  Social History Narrative   ** Merged History Encounter **       Lives in [place] w/ fiancee.  Does not routinely exercise but has very busy work-life, working M-F 12 hrs shifts and another 8 hrs on Saturdays.  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   Social Determinants of Health   Financial Resource Strain:   . Difficulty of Paying Living Expenses:   Food Insecurity:   . Worried About Programme researcher, broadcasting/film/video in the Last Year:   . Barista in the Last Year:   Transportation Needs:   . Freight forwarder (Medical):   [name] Lack of Transportation (Non-Medical):   Physical Activity:   . Days of Exercise per Week:   . Minutes of Exercise per Session:   Stress:   . Feeling of Stress :   Social Connections:   . Frequency of Communication with Friends and Family:   . Frequency of Social Gatherings with Friends and Family:   . Attends Religious Services:   . Active Member of Clubs or Organizations:   . Attends Banker Meetings:   [name] Marital Status:   Intimate Partner Violence:   . Fear of Current or Ex-Partner:   . Emotionally Abused:   [name] Physically Abused:   . Sexually Abused:      Review of Systems    General:  No chills, fever, night sweats or weight changes.  Cardiovascular:  No chest pain, dyspnea on  exertion, edema, orthopnea, palpitations, paroxysmal nocturnal dyspnea. Dermatological: No rash, lesions/masses Respiratory: No cough, dyspnea Urologic: No hematuria, dysuria Abdominal:   No nausea, vomiting, diarrhea, bright red blood per rectum, melena, or hematemesis Neurologic:  No visual changes, wkns, changes in mental status. All other systems reviewed and are otherwise negative except as noted above.  Physical Exam    VS:  BP 112/70   Pulse 64   Ht 5\' 11"  (1.803 m)   Wt 165 lb 9.6 oz (75.1 kg)   SpO2 96%   BMI 23.10 kg/m  , BMI Body mass index is 23.1 kg/m. GEN: Well nourished, well developed, in no acute distress. HEENT: normal. Neck: Supple, no JVD, carotid bruits, or masses. Cardiac: RRR, no murmurs, rubs, or gallops. No clubbing, cyanosis, edema.  Radials/DP/PT 2+ and equal bilaterally.  Respiratory:  Respirations regular and unlabored, clear to auscultation bilaterally. GI: Soft, nontender, nondistended, BS + x 4. MS: no deformity or atrophy. Skin: warm and dry, no rash. Neuro:  Strength and sensation are intact. Psych: Normal affect.  Accessory Clinical Findings    ECG personally reviewed by me today-none today.  EKG 11/27/2019  normal sinus rhythm 75 bpm T wave abnormality consider inferior ischemia no acute changes  EKG 08/09/2019 Sinus rhythm 80 bpm  Cardiac MRI 08/13/2019 IMPRESSION: 1. Severely dilated left ventricle with mild concentric hypertrophy and severely decreased systolic function (LVEF = 20%). There is severe diffuse hypokinesis with akinesis in the basal, mid and apical inferior wall. There  is transmural late gadolinium enhancement in the basal, mid and apical inferior walls. There is a heavy smoke/sludge in the left ventricular apex consistent with pre-thrombotic state. Systemic anticoagulation is recommended.  2. Normal right ventricular size, thickness and mildly decreased systolic function (LVEF = 39%). There are no regional wall  motion abnormalities.  3. Normal left and right atrial size.  4. Normal size of the aortic root, ascending aorta and pulmonary artery.  5. Mild mitral and tricuspid regurgitation.  6. Normal pericardium. No pericardial effusion.  These findings are suspicious for an ischemic cardiomyopathy, an ischemic workup is recommended.  Echocardiogram 09/04/2019 IMPRESSIONS    1. No LV thombus is seen. Left ventricular ejection fraction, by  estimation, is 20 to 25%. The left ventricle has severely decreased  function. The left ventricle demonstrates global hypokinesis. The left  ventricular internal cavity size was severely  dilated. Left ventricular diastolic parameters are consistent with Grade  II diastolic dysfunction (pseudonormalization). Elevated left atrial  pressure.  2. Right ventricular systolic function is normal. The right ventricular  size is normal. There is normal pulmonary artery systolic pressure. The  estimated right ventricular systolic pressure is 25.3 mmHg.  3. Left atrial size was mildly dilated.  4. The mitral valve is normal in structure. Mild to moderate mitral valve  regurgitation.  5. The aortic valve is normal in structure. Aortic valve regurgitation is  not visualized.  6. The inferior vena cava is normal in size with greater than 50%  respiratory variability, suggesting right atrial pressure of 3 mmHg.   Comparison(s): Prior images reviewed side by side. Changes from prior  study are noted. Although LV systolic function is unchanged, there is less  mitral regurgitation and the signs of high left atrial pressure are  improved.  ECHO TEE 08/12/2019  1. Left ventricular ejection fraction, by estimation, is 20 to 25%. The  left ventricle has severely decreased function. The left ventricle  demonstrates global hypokinesis. The left ventricular internal cavity size  was moderately dilated.  2. Right ventricular systolic function is normal.  The right ventricular  size is normal.  3. Left atrial size was mildly dilated. No left atrial/left atrial  appendage thrombus was detected. The LAA emptying velocity was 105 cm/s.  4. The mitral valve is grossly normal. Mild mitral valve regurgitation.  5. The aortic valve is tricuspid. Aortic valve regurgitation is trivial.  6. There is borderline dilatation of the ascending aorta measuring 38 mm.  There is mild (Grade II) atheroma plaque involving the transverse and  descending aorta.  7. Evidence of atrial level shunting detected by color flow Doppler.  Agitated saline contrast bubble study was positive with shunting observed  within 3-6 cardiac cycles suggestive of interatrial shunt. There is a tiny  patent foramen ovale.    Echocardiogram 11/06/2019 IMPRESSIONS    1. Left ventricular ejection fraction, by estimation, is 40 to 45%. The  left ventricle has mildly decreased function. The left ventricle  demonstrates global hypokinesis. There is severe hypokinesis of the left  ventricular, entire anteroseptal wall and  inferoseptal wall.The left ventricular internal cavity size was severely  dilated. Left ventricular diastolic parameters are consistent with Grade I  diastolic dysfunction (impaired relaxation). Elevated left ventricular  end-diastolic pressure. The LV  strain was performed but visually does not appear accurate so is not  reported.  2. Right ventricular systolic function is normal. The right ventricular  size is normal. Tricuspid regurgitation signal is inadequate for assessing  PA pressure.  3. The mitral valve is normal in structure. Mild mitral valve  regurgitation. No evidence of mitral stenosis.  4. The aortic valve is tricuspid. Aortic valve regurgitation is not  visualized. Mild aortic valve sclerosis is present, with no evidence of  aortic valve stenosis.  5. Aortic dilatation noted. There is mild dilatation of the ascending  aorta measuring 39  mm.  6. The inferior vena cava is normal in size with greater than 50%  respiratory variability, suggesting right atrial pressure of 3 mmHg.  7. Compared to prior echo 09/04/2019, LVF has improved.    Assessment & Plan   1.   Syncope-no further syncopal episodes.  Seen in the emergency department for vasovagal symptoms on 12/02/2019.  He was discharged on 12/04/2019.  He was noted to have 1 syncopal episode.  He was standing and moving boxes with his upper extremities at his place of employment.. Continue p.o. hydration Increase physical activity slowly Brace ribs with coughing/sneezing for pain relief  Cardiomyopathy- no increased dyspnea or activity intolerance today. Echocardiogram 09/04/2019 stable compared to previous LVEF 20-25%, G2 DD, trivial aortic valve regurgitation, left atrium mildly dilated.  Echocardiogram 11/06/2019 showed an LVEF improvement to 40-45%.Blood pressure today 112/70.  Plan to uptitrate medications as blood pressure allows. Continuemetoprolol Heart healthy low-sodium diet-salty 6 given Increase physical activity as tolerated Ischemic evaluation plan for after CVA recovery.  Essential hypertension-BP today112/70.  Blood pressure cuff given Continue Metoprolol Heart healthy low-sodium diet-salty 6 given Increase physical activity as tolerated Blood pressure log  L MCA CVA-TEE showed PFO, no clot/thrombus. EP consulted for loop recorder placement. He was not a candidate for loop given his low EF and possible need for ICD.  Left cranial mass-7 x 2.3 cm. Followed by PCP/neurology  Disposition: Follow-up with me and Dr. [name] scheduled  Thomasene Ripple. Celestine Bougie NP-C    12/12/2019, 10:08 AM [facility] [address] Office [phone] Fax [phone]

## 2019-12-12 ENCOUNTER — Other Ambulatory Visit: Payer: Self-pay

## 2019-12-12 ENCOUNTER — Encounter: Payer: Self-pay | Admitting: General Practice

## 2019-12-12 ENCOUNTER — Ambulatory Visit (INDEPENDENT_AMBULATORY_CARE_PROVIDER_SITE_OTHER): Payer: Self-pay | Admitting: General Practice

## 2019-12-12 VITALS — BP 112/70 | HR 64 | Ht 71.0 in | Wt 165.6 lb

## 2019-12-12 DIAGNOSIS — Z79899 Other long term (current) drug therapy: Secondary | ICD-10-CM

## 2019-12-12 DIAGNOSIS — I10 Essential (primary) hypertension: Secondary | ICD-10-CM

## 2019-12-12 DIAGNOSIS — R55 Syncope and collapse: Secondary | ICD-10-CM

## 2019-12-12 DIAGNOSIS — I429 Cardiomyopathy, unspecified: Secondary | ICD-10-CM

## 2019-12-12 DIAGNOSIS — I639 Cerebral infarction, unspecified: Secondary | ICD-10-CM

## 2019-12-12 NOTE — Patient Instructions (Addendum)
Medication Instructions:  The current medical regimen is effective;  continue present plan and medications as directed. Please refer to the Current Medication list given to you today. *If you need a refill on your cardiac medications before your next appointment, please call your pharmacy*  Special Instructions TAKE AND LOG YOUR BLOOD PRESSURE DAILY  Follow-Up: Your next appointment:  AS SCHEDULED   In Person with You may see Olga Millers, MD or one of the following Advanced Practice Providers on your designated Care Team:  [name], FNP  Corine Shelter, PA-C Marjie Skiff, [place]   At [facility], you and your health needs are our priority.  As part of our continuing mission to provide you with exceptional heart care, we have created designated Provider Care Teams.  These Care Teams include your primary Cardiologist (physician) and Advanced Practice Providers (APPs -  Physician Assistants and Nurse Practitioners) who all work together to provide you with the care you need, when you need it.  We recommend signing up for the patient portal called "MyChart".  Sign up information is provided on this After Visit Summary.  MyChart is used to connect with patients for Virtual Visits (Telemedicine).  Patients are able to view lab/test results, encounter notes, upcoming appointments, etc.  Non-urgent messages can be sent to your provider as well.   To learn more about what you can do with MyChart, go to [email or web address].

## 2019-12-16 ENCOUNTER — Encounter: Payer: Self-pay | Admitting: Registered Nurse

## 2019-12-16 ENCOUNTER — Ambulatory Visit (INDEPENDENT_AMBULATORY_CARE_PROVIDER_SITE_OTHER): Payer: Self-pay | Admitting: Registered Nurse

## 2019-12-16 ENCOUNTER — Other Ambulatory Visit: Payer: Self-pay

## 2019-12-16 VITALS — BP 133/72 | HR 62 | Temp 97.2°F | Resp 18 | Ht 71.0 in | Wt 170.2 lb

## 2019-12-16 DIAGNOSIS — I5022 Chronic systolic (congestive) heart failure: Secondary | ICD-10-CM

## 2019-12-16 NOTE — Progress Notes (Signed)
Established Patient Office Visit  Subjective:  Patient ID: David [name], male    DOB: [birth year]  Age: 54 y.o. MRN: [id number]  CC:  Chief Complaint  Patient presents with  . Hospitalization Follow-up    Patient states that he was picked up on his job 12/02/2019 because his blood pressure was dropping really low. Patient states he hit his head and fractured ribs.    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 BP dropped and he experienced syncope. Fell to ground, broke ribs on L back side. No pneumothorax. No head injury beyond laceration of ear.  On arrival, EMS found extremely low bp with systolic in 70s.  In hospital found to have AKI which improved with hydration, as did BP  Stabilized and dc'd with follow up to cards. Had his entresto and metoprolol held, cardiology will add back in as bp allows. Encouraged increased hydration and lighter work.   Today feeling well, all things considered. Pain from ribs - keeping him up at night, difficult to take deep breaths, extreme pain with coughing. He has been facing a lot of trouble getting worker's comp or disability - he is hoping to find legal aid in getting these.   He has a next follow up on 12/26/19 with cardiology, as well as another visit on 01/01/20 with Dr. [name].   Past Medical History:  Diagnosis Date  . Acute ischemic left MCA stroke (HCC)    a. 08/2019 MRI: Acute L MCA branch vessel infarctions; b. 08/2019 CTA head/neck: Patent bilat carotids/vertebrals. High grade stenosis - inf division of prox L MCA M2 branch. additional high-grade mid-dist superior L MCA M2 branch occlusion. 6mm aneurysm arising from cavernous L ICA.  David [name] Cardiomyopathy (HCC)    a. 08/2019 Echo: EF 20-25%, global HK. Gr2 DD. Nl RV fxn. RVSP 30.20mmHg.  Mildly dil LA. Mod MR.  . Cerebral aneurysm    a. 08/2019 CTA Head: 51mm aneurysm arising from the cavernous L ICA.  David [name] CHF  (congestive heart failure) (HCC)   . CKD stage G2/A2, GFR 60-89 and albumin creatinine ratio 30-299 mg/g   . Facial mass    a. CT head: 7.0 x 2.3 cm soft tissue mass along lat aspect of L temporal bone, involving L ext ear - highly suscpicious for malignancy.  David [name] Heart attack (HCC)   . Heart murmur   . Hypertension   . Moderate mitral regurgitation   . Nonischemic cardiomyopathy (HCC)   . PFO (patent foramen ovale)   . Stroke (HCC)   . Tobacco abuse     Past Surgical History:  Procedure Laterality Date  . BUBBLE STUDY  08/12/2019   Procedure: BUBBLE STUDY;  Surgeon: Parke Poisson, MD;  Location: [facility] ENDOSCOPY;  Service: Cardiology;;  . EXTERNAL EAR SURGERY    . TEE WITHOUT CARDIOVERSION N/A 08/12/2019   Procedure: TRANSESOPHAGEAL ECHOCARDIOGRAM (TEE);  Surgeon: Parke Poisson, MD;  Location: [facility] ENDOSCOPY;  Service: Cardiology;  Laterality: N/A;    Family History  Problem Relation Age of Onset  . Stroke Sister   . Cancer Mother        died @ 9  . Other Father        died of CO poisoning as a young man.    Social History   Socioeconomic History  . Marital status: Single    Spouse name: Not on file  . Number of children:  6  . Years of education: Not on file  . Highest education level: Not on file  Occupational History  . Not on file  Tobacco Use  . Smoking status: Current Every Day Smoker    Packs/day: 0.50    Years: 35.00    Pack years: 17.50    Types: Cigarettes  . Smokeless tobacco: Never Used  Vaping Use  . Vaping Use: Never used  Substance and Sexual Activity  . Alcohol use: No  . Drug use: Not Currently    Types: Marijuana    Comment: smokes several blunts on the weekend.  . Sexual activity: Yes  Other Topics Concern  . Not on file  Social History Narrative   ** Merged History Encounter **       Lives in [place] w/ fiancee.  Does not routinely exercise but has very busy work-life, working M-F 12 hrs shifts and another 8 hrs on Saturdays.  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   Social Determinants of Health   Financial Resource Strain:   . Difficulty of Paying Living Expenses:   Food Insecurity:   . Worried About Programme researcher, broadcasting/film/video in the Last Year:   . Barista in the Last Year:   Transportation Needs:   . Freight forwarder (Medical):   David [name] Lack of Transportation (Non-Medical):   Physical Activity:   . Days of Exercise per Week:   . Minutes of Exercise per Session:   Stress:   . Feeling of Stress :   Social Connections:   . Frequency of Communication with Friends and Family:   . Frequency of Social Gatherings with Friends and Family:   . Attends Religious Services:   . Active Member of Clubs or Organizations:   . Attends Banker Meetings:   David [name] Marital Status:   Intimate Partner Violence:   . Fear of Current or Ex-Partner:   . Emotionally Abused:   David [name] Physically Abused:   . Sexually Abused:     Outpatient Medications Prior to Visit  Medication Sig Dispense Refill  . aspirin EC 81 MG tablet Take 1 tablet (81 mg total) by mouth daily. 90 tablet 3  . atorvastatin (LIPITOR) 80 MG tablet TAKE 1 TABLET (80 MG TOTAL) BY MOUTH DAILY AT 6 PM. 30 tablet 8  . ibuprofen (ADVIL,MOTRIN) 600 MG tablet Take 1 tablet (600 mg total) by mouth every 6 (six) hours as needed. 30 tablet 0  . metoprolol tartrate (LOPRESSOR) 25 MG tablet Take 0.5 tablets (12.5 mg total) by mouth 2 (two) times daily. 30 tablet 0  . nicotine (NICODERM CQ) 21 mg/24hr patch Place 1 patch (21 mg total) onto the skin daily. 28 patch 1  . pantoprazole (PROTONIX) 40 MG tablet Take 1 tablet (40 mg total) by mouth daily. 30 tablet 0   No facility-administered medications prior to visit.    No Known Allergies  ROS Review of Systems  Constitutional: Negative.   HENT: Negative.   Eyes: Negative.   Respiratory: Negative.   Cardiovascular: Negative.   Gastrointestinal: Negative.   Endocrine: Negative.   Genitourinary:  Negative.   Musculoskeletal: Positive for back pain. Negative for arthralgias, gait problem, joint swelling, myalgias, neck pain and neck stiffness.  Skin: Negative.   Allergic/Immunologic: Negative.   Neurological: Negative.   Hematological: Negative.   Psychiatric/Behavioral: Negative.   All other systems reviewed and are negative.     Objective:    Physical Exam  Vitals and nursing note reviewed.  Constitutional:      General: He is not in acute distress.    Appearance: Normal appearance. He is normal weight. He is not ill-appearing, toxic-appearing or diaphoretic.  Cardiovascular:     Rate and Rhythm: Normal rate and regular rhythm.     Pulses: Normal pulses.     Heart sounds: Normal heart sounds. No murmur heard.  No friction rub. No gallop.   Pulmonary:     Effort: Pulmonary effort is normal. No respiratory distress.     Breath sounds: Normal breath sounds. No stridor. No wheezing, rhonchi or rales.  Chest:     Chest wall: No tenderness.  Skin:    General: Skin is warm and dry.     Capillary Refill: Capillary refill takes less than 2 seconds.     Coloration: Skin is not jaundiced or pale.     Findings: No bruising or lesion.  Neurological:     General: No focal deficit present.     Mental Status: He is alert and oriented to person, place, and time. Mental status is at baseline.  Psychiatric:        Mood and Affect: Mood normal.        Behavior: Behavior normal.        Thought Content: Thought content normal.        Judgment: Judgment normal.     BP 133/72   Pulse 62   Temp (!) 97.2 F (36.2 C) (Temporal)   Resp 18   Ht 5\' 11"  (1.803 m)   Wt 170 lb 3.2 oz (77.2 kg)   SpO2 98%   BMI 23.74 kg/m  Wt Readings from Last 3 Encounters:  12/16/19 170 lb 3.2 oz (77.2 kg)  12/12/19 165 lb 9.6 oz (75.1 kg)  12/04/19 160 lb 12.8 oz (72.9 kg)     There are no preventive care reminders to display for this patient.  There are no preventive care reminders to display  for this patient.  Lab Results  Component Value Date   TSH 1.840 08/21/2019   Lab Results  Component Value Date   WBC 5.0 12/04/2019   HGB 11.7 (L) 12/04/2019   HCT 35.2 (L) 12/04/2019   MCV 95.7 12/04/2019   PLT 186 12/04/2019   Lab Results  Component Value Date   NA 138 12/04/2019   K 3.4 (L) 12/04/2019   CO2 29 12/04/2019   GLUCOSE 107 (H) 12/04/2019   BUN 13 12/04/2019   CREATININE 1.19 12/04/2019   BILITOT 1.2 12/03/2019   ALKPHOS 66 12/03/2019   AST 24 12/03/2019   ALT 14 12/03/2019   PROT 5.7 (L) 12/03/2019   ALBUMIN 2.9 (L) 12/03/2019   CALCIUM 8.2 (L) 12/04/2019   ANIONGAP 5 12/04/2019   Lab Results  Component Value Date   CHOL 93 (L) 08/21/2019   Lab Results  Component Value Date   HDL 29 (L) 08/21/2019   Lab Results  Component Value Date   LDLCALC 51 08/21/2019   Lab Results  Component Value Date   TRIG 57 08/21/2019   Lab Results  Component Value Date   CHOLHDL 3.2 08/21/2019   Lab Results  Component Value Date   HGBA1C 5.4 08/21/2019      Assessment & Plan:   Problem List Items Addressed This Visit      Cardiovascular and Mediastinum   Chronic systolic CHF (congestive heart failure) (HCC) - Primary   Relevant Orders   Comprehensive metabolic panel  CK   CBC with Differential      No orders of the defined types were placed in this encounter.   Follow-up: No follow-ups on file.   PLAN  Will draw labs to ensure continued normalcy  If labs normal, will consider tramadol qhs for pain relief from rib pain  Continue follow up with cardiology. Pt seems very stable today.   Patient encouraged to call clinic with any questions, comments, or concerns.  Janeece Agee, NP

## 2019-12-16 NOTE — Patient Instructions (Signed)
° ° ° °  If you have lab work done today you will be contacted with your lab results within the next 2 weeks.  If you have not heard from us then please contact us. The fastest way to get your results is to register for My Chart. ° ° °IF you received an x-ray today, you will receive an invoice from [facility]. Please contact [facility] at [phone] with questions or concerns regarding your invoice.  ° °IF you received labwork today, you will receive an invoice from [facility]. Please contact [facility] at [phone] with questions or concerns regarding your invoice.  ° °Our billing staff will not be able to assist you with questions regarding bills from these companies. ° °You will be contacted with the lab results as soon as they are available. The fastest way to get your results is to activate your My Chart account. Instructions are located on the last page of this paperwork. If you have not heard from us regarding the results in 2 weeks, please contact this office. °  ° ° ° °

## 2019-12-17 ENCOUNTER — Other Ambulatory Visit: Payer: Self-pay | Admitting: Registered Nurse

## 2019-12-17 DIAGNOSIS — S2242XD Multiple fractures of ribs, left side, subsequent encounter for fracture with routine healing: Secondary | ICD-10-CM

## 2019-12-17 LAB — COMPREHENSIVE METABOLIC PANEL
ALT: 143 IU/L — ABNORMAL HIGH (ref 0–44)
AST: 86 IU/L — ABNORMAL HIGH (ref 0–40)
Albumin/Globulin Ratio: 1.4 (ref 1.2–2.2)
Albumin: 3.9 g/dL (ref 3.8–4.9)
Alkaline Phosphatase: 126 IU/L — ABNORMAL HIGH (ref 48–121)
BUN/Creatinine Ratio: 17 (ref 9–20)
BUN: 16 mg/dL (ref 6–24)
Bilirubin Total: 0.4 mg/dL (ref 0.0–1.2)
CO2: 25 mmol/L (ref 20–29)
Calcium: 9.1 mg/dL (ref 8.7–10.2)
Chloride: 108 mmol/L — ABNORMAL HIGH (ref 96–106)
Creatinine, Ser: 0.96 mg/dL (ref 0.76–1.27)
GFR calc Af Amer: 102 mL/min/{1.73_m2} (ref >59)
GFR calc non Af Amer: 89 mL/min/{1.73_m2} (ref >59)
Globulin, Total: 2.7 g/dL (ref 1.5–4.5)
Glucose: 85 mg/dL (ref 65–99)
Potassium: 3.8 mmol/L (ref 3.5–5.2)
Sodium: 144 mmol/L (ref 134–144)
Total Protein: 6.6 g/dL (ref 6.0–8.5)

## 2019-12-17 LAB — CK: Total CK: 112 U/L (ref 41–331)

## 2019-12-17 LAB — CBC WITH DIFFERENTIAL/PLATELET
Basophils Absolute: 0 10*3/uL (ref 0.0–0.2)
Basos: 1 %
EOS (ABSOLUTE): 0.4 10*3/uL (ref 0.0–0.4)
Eos: 5 %
Hematocrit: 38 % (ref 37.5–51.0)
Hemoglobin: 12.7 g/dL — ABNORMAL LOW (ref 13.0–17.7)
Immature Grans (Abs): 0 10*3/uL (ref 0.0–0.1)
Immature Granulocytes: 0 %
Lymphocytes Absolute: 3 10*3/uL (ref 0.7–3.1)
Lymphs: 40 %
MCH: 31.9 pg (ref 26.6–33.0)
MCHC: 33.4 g/dL (ref 31.5–35.7)
MCV: 96 fL (ref 79–97)
Monocytes Absolute: 0.8 10*3/uL (ref 0.1–0.9)
Monocytes: 10 %
Neutrophils Absolute: 3.4 10*3/uL (ref 1.4–7.0)
Neutrophils: 44 %
Platelets: 231 10*3/uL (ref 150–450)
RBC: 3.98 x10E6/uL — ABNORMAL LOW (ref 4.14–5.80)
RDW: 12.3 % (ref 11.6–15.4)
WBC: 7.6 10*3/uL (ref 3.4–10.8)

## 2019-12-17 MED ORDER — TRAMADOL HCL 50 MG PO TABS: 50.0000 mg | ORAL_TABLET | Freq: Every day | ORAL | 0 refills | Status: AC

## 2019-12-17 NOTE — Progress Notes (Signed)
Labs are looking good, no urgent concerns. Will send short course of tramadol to take before bedtime to help with rib pain. He should continue follow up as planned with cardiology.  Thank you  Jari Sportsman, NP

## 2019-12-22 ENCOUNTER — Other Ambulatory Visit: Payer: Self-pay | Admitting: Student

## 2019-12-24 NOTE — Telephone Encounter (Signed)
   Patient Instructions by Alyson Ingles, LPN at 10/07/84 1:45 PM Author: Alyson Ingles, LPN Author Type: Licensed Practical Nurse Filed: 11/05/2019 2:24 PM  Note Status: Signed Cosign: Cosign Not Required Encounter Date: 11/05/2019  Editor: Alyson Ingles, LPN (Licensed Practical Nurse)               Medication Instructions:  David [name]   START ENTRESTO 24/26MG  TWICE DAILY  *If you need a refill on your cardiac medications before your next appointment, please call your pharmacy*

## 2019-12-25 NOTE — Progress Notes (Signed)
Cardiology Clinic Note   Patient Name: David [name] Date of Encounter: 12/26/2019  Primary Care Provider:  Janeece Agee, NP Primary Cardiologist:  Olga Millers, MD  Patient Profile    David Roan Spinks30 year old male presents today for follow-up evaluation of his chronic systolic CHF and cardiomyopathy.  Past Medical History    Past Medical History:  Diagnosis Date  . Acute ischemic left MCA stroke (HCC)    a. 08/2019 MRI: Acute L MCA branch vessel infarctions; b. 08/2019 CTA head/neck: Patent bilat carotids/vertebrals. High grade stenosis - inf division of prox L MCA M2 branch. additional high-grade mid-dist superior L MCA M2 branch occlusion. 41mm aneurysm arising from cavernous L ICA.  [name] Cardiomyopathy (HCC)    a. 08/2019 Echo: EF 20-25%, global HK. Gr2 DD. Nl RV fxn. RVSP 30.83mmHg.  Mildly dil LA. Mod MR.  . Cerebral aneurysm    a. 08/2019 CTA Head: 75mm aneurysm arising from the cavernous L ICA.  [name] CHF (congestive heart failure) (HCC)   . CKD stage G2/A2, GFR 60-89 and albumin creatinine ratio 30-299 mg/g   . Facial mass    a. CT head: 7.0 x 2.3 cm soft tissue mass along lat aspect of L temporal bone, involving L ext ear - highly suscpicious for malignancy.  [name] Heart attack (HCC)   . Heart murmur   . Hypertension   . Moderate mitral regurgitation   . Nonischemic cardiomyopathy (HCC)   . PFO (patent foramen ovale)   . Stroke (HCC)   . Tobacco abuse    Past Surgical History:  Procedure Laterality Date  . BUBBLE STUDY  08/12/2019   Procedure: BUBBLE STUDY;  Surgeon: Parke Poisson, MD;  Location: [facility] ENDOSCOPY;  Service: Cardiology;;  . EXTERNAL EAR SURGERY    . TEE WITHOUT CARDIOVERSION N/A 08/12/2019   Procedure: TRANSESOPHAGEAL ECHOCARDIOGRAM (TEE);  Surgeon: Parke Poisson, MD;  Location: [facility] ENDOSCOPY;  Service: Cardiology;  Laterality: N/A;    Allergies  No Known Allergies  History of Present Illness    David [name] has a PMH of acute ischemic CVA,  chronic systolic CHF, HTN, chest pain, cardiomyopathy, marijuana use and tobacco abuse.  He was seen by cardiology on 08/11/2019 as a consult for new cardiomyopathy. He had not seen a healthcare provider in many years. He works 68 hours a week and had done this for around 20 years. His work was somewhat physical and he performed tasks without physical limitations. On 3/21 he noted chest discomfort that lasted for about 15 minutes and resolve spontaneously. He mentioned this to his fiance but then did not seek medical attention for further evaluation. On 08/09/2019 he returned home from work and was noted to have right facial droop and slurred speech. He was taken by EMS to [facility], ED. A CT of his head showed no hemorrhage however, subacute infarct in the mid left frontal lobe and left frontal operculum or identified. He was mildly hypertensive at presentation. He was also noted to have a 7 x 2.3 cm soft tissue mass along the lateral aspect of his left temporal bone. This was highly suspicious for malignancy. He was admitted and underwent CTA of his head and neck which showed high-grade stenosis in the inferior division of the proximal left MCA M2 branch and high-grade stenosis in the mid to distal posterior left MCA M2 branch. A 7 mm aneurysm arising from his left ICA was also noted. An MRI showed acute left MCA branch vessel infarctions consistent with embolic  stroke.  An echocardiogram 08/10/2019 showed an EF of 20-25% with global hypokinesis and grade 2 diastolic dysfunction. Moderate mitral valve regurgitation. He denied dyspnea on exertion, PND, orthopnea, and lower extremity edema.  During his lastcardiology visit3/03/2020 he denied chest pain and indicated he was feeling slightly better. EKG showed normal sinus rhythm with PVCs and one run of nonsustained ventricular tachycardia that lasted 15 beats. TEE 08/12/2019 showed EF 20-25%, global hypokinesis, mildly dilated left atria, and  tiny patent foramen ovale. Cardiac MRI 08/13/2019 showed severely dilated left ventricle with mild concentric hypertrophy and severely decreased systolic function LVEF 20%. Mild mitral and tricuspid regurgitation. Findings suspicious for ischemic cardiomyopathy, ischemic work-up recommended. Evidence of prethrombotic state there was no evidence of clot/thrombus however it was believed that thrombus may have been cause. Patient was anticoagulated previously by neurology service. With a plan to transition losartan to [facility] as outpatient. He was given LifeVest at discharge with a plan for possible ICD in 3 months.  He presented tothe clinic 6/1/21for follow-up evaluation and stateedhe didnot feel any different than he did prior to going to the hospital. He hadbeen wearing his LifeVest as directed. He didnot have any increased shortness of breath and denies chest pain. I stoppedhis losartan and startedhim on Entresto. IorderedBMP checked in 1 week and scheduledfollow-up in 3 to 4 weeks for further evaluation.  He was seen in follow-up evaluation by Dr. Ladona Ridgel on 11/12/2019. During that time he was doing well. His Eliquis was stopped and he was started on 81 mg aspirin. His follow-up echocardiogram 11/06/2019 showed an LVEF improvement to 40-45%. He was instructed to follow-up in 1 year.  He presented to the emergency department on 11/21/2019 with complaints of coughing up/vomiting blood. His nausea was controlled with Zofran. CT abdomen showed no acute findings. It was felt that his symptoms were related to gastroenteritis. He denied dysuria or hematuria. He was discharged in stable condition. His creatinine at the time was 1.86.  He presented to the clinic 11/27/19 and statedhis Eliquis was stopped by Dr. Ladona Ridgel and he is now taking aspirin. He had an acute episode of hematemesis and presented to the emergency department. He has had no more episodes of GI bleeding. He was  back at work and doing 40 hours/week. He stated he had some back pain  which appears to be muscular in nature. He felt overall he had more energy and was doing well. I did not increase his Entresto  due to his recent bump in creatinine surrounding his acute episode of GI bleeding. I planned follow-up with him virtually in 1 month, and order a BMP  for further evaluation.  He was recently seen in the emergency department for vasovagal symptoms on 12/02/2019.  He was discharged on 12/04/2019.  He was noted to have 1 syncopal episode.  He was standing and moving boxes with his upper extremities.  Boxes were noted to be medium weight.  He lost consciousness and did not recall any prodromal symptoms.  EMS was called and he was found to be diaphoretic and hypotensive with a blood pressure in 70s.  He received IV hydration.  EKG showed no arrhythmias.  Recent CTA showed no coronary disease.  His metoprolol was continued at 12.5 and his Entresto and carvedilol were held.  His renal function improved with hydration.  He was instructed to have outpatient renal ultrasound due to a renal lesion.  He suffered a rib fracture involving the left 10th 11th and 12th ribs.  He presented  to the clinic 12/12/19 for follow-up evaluation and stated he felt well.  He  had no further episodes of syncope.  He stated that his ribs continued to hurt.  He presented with his wife and his wife stated that his place employment was not allowing him to claim Workmen's Comp.  Due to his blood pressure  I didnot make any medication changes.  I had him continue to increase his physical activity slowly.  I educated about bracing ribs for pain relief with coughing sneezing, and movements.  I planned  follow-up in 1 month for reevaluation.  He presents to the clinic today for follow-up evaluation and states he has slowly started to increase his physical activity.  He has been to see his PCP who prescribed tramadol for his rib pain.  He continues  to try to work with his place of employment to make this case Workmen's Comp.  Him and his wife have contacted a lawyer to help him with this case.  I will stop his metoprolol and restart his Entresto.  We will have him follow-up in 4 weeks for reevaluation and possible up titration.  I will have him continue to increase his physical activity slowly as tolerated.  We will have patient assistance contact him for help with his medication cost.  Today hedenies chest pain, shortness of breath, lower extremity edema, fatigue, palpitations, melena, hematuria, hemoptysis, diaphoresis, weakness, presyncope, syncope, orthopnea, and PND.  Home Medications    Prior to Admission medications   Medication Sig Start Date End Date Taking? Authorizing Provider  aspirin EC 81 MG tablet Take 1 tablet (81 mg total) by mouth daily. 11/12/19   Marinus Maw, MD  atorvastatin (LIPITOR) 80 MG tablet TAKE 1 TABLET (80 MG TOTAL) BY MOUTH DAILY AT 6 PM. 10/07/19   Tillery, Mariam Dollar, PA-C  ibuprofen (ADVIL,MOTRIN) 600 MG tablet Take 1 tablet (600 mg total) by mouth every 6 (six) hours as needed. 05/13/18   Luevenia Maxin, Mina A, PA-C  metoprolol tartrate (LOPRESSOR) 25 MG tablet Take 0.5 tablets (12.5 mg total) by mouth 2 (two) times daily. 12/05/19 03/04/20  Joseph Art, DO  nicotine (NICODERM CQ) 21 mg/24hr patch Place 1 patch (21 mg total) onto the skin daily. 09/12/19   Janeece Agee, NP  pantoprazole (PROTONIX) 40 MG tablet Take 1 tablet (40 mg total) by mouth daily. 12/05/19   Joseph Art, DO    Family History    Family History  Problem Relation Age of Onset  . Stroke Sister   . Cancer Mother        died @ 84  . Other Father        died of CO poisoning as a young man.   He indicated that his mother is deceased. He indicated that his father is deceased. He indicated that his sister is alive.  Social History    Social History   Socioeconomic History  . Marital status: Single    Spouse name: Not on file  .  Number of children: 6  . Years of education: Not on file  . Highest education level: Not on file  Occupational History  . Not on file  Tobacco Use  . Smoking status: Current Every Day Smoker    Packs/day: 0.50    Years: 35.00    Pack years: 17.50    Types: Cigarettes  . Smokeless tobacco: Never Used  Vaping Use  . Vaping Use: Never used  Substance and Sexual Activity  . Alcohol  use: No  . Drug use: Not Currently    Types: Marijuana    Comment: smokes several blunts on the weekend.  . Sexual activity: Yes  Other Topics Concern  . Not on file  Social History Narrative   ** Merged History Encounter **       Lives in [place] w/ fiancee.  Does not routinely exercise but has very busy work-life, working M-F 12 hrs shifts and another 8 hrs on Saturdays.  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   Social Determinants of Health   Financial Resource Strain:   . Difficulty of Paying Living Expenses:   Food Insecurity:   . Worried About Programme researcher, broadcasting/film/video in the Last Year:   . Barista in the Last Year:   Transportation Needs:   . Freight forwarder (Medical):   [name] Lack of Transportation (Non-Medical):   Physical Activity:   . Days of Exercise per Week:   . Minutes of Exercise per Session:   Stress:   . Feeling of Stress :   Social Connections:   . Frequency of Communication with Friends and Family:   . Frequency of Social Gatherings with Friends and Family:   . Attends Religious Services:   . Active Member of Clubs or Organizations:   . Attends Banker Meetings:   [name] Marital Status:   Intimate Partner Violence:   . Fear of Current or Ex-Partner:   . Emotionally Abused:   [name] Physically Abused:   . Sexually Abused:      Review of Systems    General:  No chills, fever, night sweats or weight changes.  Cardiovascular:  No chest pain, dyspnea on exertion, edema, orthopnea, palpitations, paroxysmal nocturnal dyspnea. Dermatological: No  rash, lesions/masses Respiratory: No cough, dyspnea Urologic: No hematuria, dysuria Abdominal:   No nausea, vomiting, diarrhea, bright red blood per rectum, melena, or hematemesis Neurologic:  No visual changes, wkns, changes in mental status. All other systems reviewed and are otherwise negative except as noted above.  Physical Exam    VS:  BP (!) 130/82   Pulse 65   Ht  (1.803 m)   Wt 171 lb 3.2 oz (77.7 kg)   SpO2 96%   BMI 23.88 kg/m  , BMI Body mass index is 23.88 kg/m. GEN: Well nourished, well developed, in no acute distress. HEENT: normal. Neck: Supple, no JVD, carotid bruits, or masses. Cardiac: RRR, no murmurs, rubs, or gallops. No clubbing, cyanosis, edema.  Radials/DP/PT 2+ and equal bilaterally.  Respiratory:  Respirations regular and unlabored, clear to auscultation bilaterally. GI: Soft, nontender, nondistended, BS + x 4. MS: no deformity or atrophy. Skin: warm and dry, no rash. Neuro:  Strength and sensation are intact. Psych: Normal affect.  Accessory Clinical Findings    Recent Labs: 08/14/2019: Magnesium 2.1 08/21/2019: TSH 1.840 12/16/2019: ALT 143; BUN 16; Creatinine, Ser 0.96; Hemoglobin 12.7; Platelets 231; Potassium 3.8; Sodium 144   Recent Lipid Panel    Component Value Date/Time   CHOL 93 (L) 08/21/2019 0908   TRIG 57 08/21/2019 0908   HDL 29 (L) 08/21/2019 0908   CHOLHDL 3.2 08/21/2019 0908   CHOLHDL 4.4 08/10/2019 0439   VLDL 18 08/10/2019 0439   LDLCALC 51 08/21/2019 0908    ECG personally reviewed by me today-none today.  EKG 11/27/2019  normal sinus rhythm 75 bpm T wave abnormality consider inferior ischemia no acute changes  EKG 08/09/2019 Sinus rhythm 80 bpm  Cardiac MRI 08/13/2019 IMPRESSION: 1. Severely dilated left ventricle with mild concentric hypertrophy and severely decreased systolic function (LVEF = 20%). There is severe diffuse hypokinesis with akinesis in the basal, mid and apical inferior wall. There is  transmural late gadolinium enhancement in the basal, mid and apical inferior walls. There is a heavy smoke/sludge in the left ventricular apex consistent with pre-thrombotic state. Systemic anticoagulation is recommended.  2. Normal right ventricular size, thickness and mildly decreased systolic function (LVEF = 39%). There are no regional wall motion abnormalities.  3. Normal left and right atrial size.  4. Normal size of the aortic root, ascending aorta and pulmonary artery.  5. Mild mitral and tricuspid regurgitation.  6. Normal pericardium. No pericardial effusion.  These findings are suspicious for an ischemic cardiomyopathy, an ischemic workup is recommended.  Echocardiogram 09/04/2019 IMPRESSIONS    1. No LV thombus is seen. Left ventricular ejection fraction, by  estimation, is 20 to 25%. The left ventricle has severely decreased  function. The left ventricle demonstrates global hypokinesis. The left  ventricular internal cavity size was severely  dilated. Left ventricular diastolic parameters are consistent with Grade  II diastolic dysfunction (pseudonormalization). Elevated left atrial  pressure.  2. Right ventricular systolic function is normal. The right ventricular  size is normal. There is normal pulmonary artery systolic pressure. The  estimated right ventricular systolic pressure is 25.3 mmHg.  3. Left atrial size was mildly dilated.  4. The mitral valve is normal in structure. Mild to moderate mitral valve  regurgitation.  5. The aortic valve is normal in structure. Aortic valve regurgitation is  not visualized.  6. The inferior vena cava is normal in size with greater than 50%  respiratory variability, suggesting right atrial pressure of 3 mmHg.   Comparison(s): Prior images reviewed side by side. Changes from prior  study are noted. Although LV systolic function is unchanged, there is less  mitral regurgitation and the signs of high left  atrial pressure are  improved.  ECHO TEE 08/12/2019  1. Left ventricular ejection fraction, by estimation, is 20 to 25%. The  left ventricle has severely decreased function. The left ventricle  demonstrates global hypokinesis. The left ventricular internal cavity size  was moderately dilated.  2. Right ventricular systolic function is normal. The right ventricular  size is normal.  3. Left atrial size was mildly dilated. No left atrial/left atrial  appendage thrombus was detected. The LAA emptying velocity was 105 cm/s.  4. The mitral valve is grossly normal. Mild mitral valve regurgitation.  5. The aortic valve is tricuspid. Aortic valve regurgitation is trivial.  6. There is borderline dilatation of the ascending aorta measuring 38 mm.  There is mild (Grade II) atheroma plaque involving the transverse and  descending aorta.  7. Evidence of atrial level shunting detected by color flow Doppler.  Agitated saline contrast bubble study was positive with shunting observed  within 3-6 cardiac cycles suggestive of interatrial shunt. There is a tiny  patent foramen ovale.    Echocardiogram 11/06/2019 IMPRESSIONS    1. Left ventricular ejection fraction, by estimation, is 40 to 45%. The  left ventricle has mildly decreased function. The left ventricle  demonstrates global hypokinesis. There is severe hypokinesis of the left  ventricular, entire anteroseptal wall and  inferoseptal wall.The left ventricular internal cavity size was severely  dilated. Left ventricular diastolic parameters are consistent with Grade I  diastolic dysfunction (impaired relaxation). Elevated left ventricular  end-diastolic pressure. The LV  strain was  performed but visually does not appear accurate so is not  reported.  2. Right ventricular systolic function is normal. The right ventricular  size is normal. Tricuspid regurgitation signal is inadequate for assessing  PA pressure.  3. The mitral valve  is normal in structure. Mild mitral valve  regurgitation. No evidence of mitral stenosis.  4. The aortic valve is tricuspid. Aortic valve regurgitation is not  visualized. Mild aortic valve sclerosis is present, with no evidence of  aortic valve stenosis.  5. Aortic dilatation noted. There is mild dilatation of the ascending  aorta measuring 39 mm.  6. The inferior vena cava is normal in size with greater than 50%  respiratory variability, suggesting right atrial pressure of 3 mmHg.  7. Compared to prior echo 09/04/2019, LVF has improved.   Assessment & Plan   1.  Cardiomyopathy- continues to slowly increase his physical activity.  No increased dyspnea or activity intolerance . Echocardiogram 09/04/2019 stable compared to previous LVEF 20-25%, G2 DD, trivial aortic valve regurgitation, left atrium mildly dilated.Echocardiogram 6/2/2021showed an LVEF improvement to 40-45%.Blood pressure today130/82.   Restart Entresto 24/26 Stopmetoprolol Heart healthy low-sodium diet-salty 6 given Increase physical activity as tolerated Ischemic evaluation plan for after CVA recovery. Order BMP in 4 weeks.   Syncope-no further syncopal episodes.  Has started to slowly increase his physical activity and is walking daily. Seen in the emergency department for vasovagal symptoms on 12/02/2019.  He was discharged on 12/04/2019.  He was noted to have 1 syncopal episode.  He was standing and moving boxes with his upper extremities at his place of employment.. Continue p.o. hydration Increase physical activity slowly Brace ribs with coughing/sneezing for pain relief  Essential hypertension-BP [id number].    Well-controlled at home. Blood pressure cuff given at last appointment Continue Metoprolol Heart healthy low-sodium diet-salty 6 given Increase physical activity as tolerated Blood pressure log  L MCA CVA-TEE showed PFO, no clot/thrombus. EP consulted for loop recorder placement. He was not  a candidate for loop given his low EF and possible need for ICD.  Left cranial mass-7 x 2.3 cm. Followed by PCP/neurology  Disposition: Follow-up with me or Dr. [name] in 1 month.   Thomasene Ripple. Delaynie Stetzer NP-C    12/26/2019, 11:12 AM [facility] [address] Office [phone] Fax [phone]

## 2019-12-26 ENCOUNTER — Encounter: Payer: Self-pay | Admitting: General Practice

## 2019-12-26 ENCOUNTER — Other Ambulatory Visit: Payer: Self-pay

## 2019-12-26 ENCOUNTER — Ambulatory Visit (INDEPENDENT_AMBULATORY_CARE_PROVIDER_SITE_OTHER): Payer: Self-pay | Admitting: General Practice

## 2019-12-26 VITALS — BP 130/82 | HR 65 | Ht 71.0 in | Wt 171.2 lb

## 2019-12-26 DIAGNOSIS — I10 Essential (primary) hypertension: Secondary | ICD-10-CM

## 2019-12-26 DIAGNOSIS — R55 Syncope and collapse: Secondary | ICD-10-CM

## 2019-12-26 DIAGNOSIS — I639 Cerebral infarction, unspecified: Secondary | ICD-10-CM

## 2019-12-26 DIAGNOSIS — I429 Cardiomyopathy, unspecified: Secondary | ICD-10-CM

## 2019-12-26 MED ORDER — ATORVASTATIN CALCIUM 80 MG PO TABS: 80.0000 mg | ORAL_TABLET | Freq: Every day | ORAL | 8 refills | Status: DC

## 2019-12-26 MED ORDER — ENTRESTO 24-26 MG PO TABS: 1.0000 | ORAL_TABLET | Freq: Two times a day (BID) | ORAL | 3 refills | Status: DC

## 2019-12-26 MED ORDER — PANTOPRAZOLE SODIUM 40 MG PO TBEC: 40.0000 mg | DELAYED_RELEASE_TABLET | Freq: Every day | ORAL | 6 refills | Status: DC

## 2019-12-26 MED FILL — PANTOPRAZOLE SOD DR 40 MG T: 40 | 30 days supply | Qty: 30 | Fill #0

## 2019-12-26 MED FILL — ATORVASTATIN 80 MG TABLET: 80 | 30 days supply | Qty: 30 | Fill #0

## 2019-12-26 MED FILL — ENTRESTO 24 MG-26 MG TABLET: 24-26 | 30 days supply | Qty: 60 | Fill #0

## 2019-12-26 NOTE — Patient Instructions (Signed)
Medication Instructions:  STOP METOPROLOL  START ENTRESTO 24/26MG  TWICE DAILY *If you need a refill on your cardiac medications before your next appointment, please call your pharmacy*  Special Instructions PLEASE READ AND FOLLOW HEART HEALTHY, LOW SALT DIET ATTACHED  PLEASE INCREASE PHYSICAL ACTIVITY AS TOLERATED  Follow-Up: Your next appointment:  4 week(s)  In Person with Olga Millers, MD OR JESSE CLEAVER, FNP-C  At [facility] you and your health needs are our priority.  As part of our continuing mission to provide you with exceptional heart care, we have created designated Provider Care Teams.  These Care Teams include your primary Cardiologist (physician) and Advanced Practice Providers (APPs -  Physician Assistants and Nurse Practitioners) who all work together to provide you with the care you need, when you need it.  We recommend signing up for the patient portal called "MyChart".  Sign up information is provided on this After Visit Summary.  MyChart is used to connect with patients for Virtual Visits (Telemedicine).  Patients are able to view lab/test results, encounter notes, upcoming appointments, etc.  Non-urgent messages can be sent to your provider as well.   To learn more about what you can do with MyChart, go to [email or web address].      DASH Eating Plan DASH stands for "Dietary Approaches to Stop Hypertension." The DASH eating plan is a healthy eating plan that has been shown to reduce high blood pressure (hypertension). It may also reduce your risk for type 2 diabetes, heart disease, and stroke. The DASH eating plan may also help with weight loss. What are tips for following this plan?  General guidelines  Avoid eating more than 2,300 mg (milligrams) of salt (sodium) a day. If you have hypertension, you may need to reduce your sodium intake to 1,500 mg a day.  Limit alcohol intake to no more than 1 drink a day for nonpregnant women and 2 drinks a day for men.  One drink equals 12 oz of beer, 5 oz of wine, or 1 oz of hard liquor.  Work with your health care provider to maintain a healthy body weight or to lose weight. Ask what an ideal weight is for you.  Get at least 30 minutes of exercise that causes your heart to beat faster (aerobic exercise) most days of the week. Activities may include walking, swimming, or biking.  Work with your health care provider or diet and nutrition specialist (dietitian) to adjust your eating plan to your individual calorie needs. Reading food labels   Check food labels for the amount of sodium per serving. Choose foods with less than 5 percent of the Daily Value of sodium. Generally, foods with less than 300 mg of sodium per serving fit into this eating plan.  To find whole grains, look for the word "whole" as the first word in the ingredient list. Shopping  Buy products labeled as "low-sodium" or "no salt added."  Buy fresh foods. Avoid canned foods and premade or frozen meals. Cooking  Avoid adding salt when cooking. Use salt-free seasonings or herbs instead of table salt or sea salt. Check with your health care provider or pharmacist before using salt substitutes.  Do not fry foods. Cook foods using healthy methods such as baking, boiling, grilling, and broiling instead.  Cook with heart-healthy oils, such as olive, canola, soybean, or sunflower oil. Meal planning  Eat a balanced diet that includes: ? 5 or more servings of fruits and vegetables each day. At each meal, try to fill half  of your plate with fruits and vegetables. ? Up to 6-8 servings of whole grains each day. ? Less than 6 oz of lean meat, poultry, or fish each day. A 3-oz serving of meat is about the same size as a deck of cards. One egg equals 1 oz. ? 2 servings of low-fat dairy each day. ? A serving of nuts, seeds, or beans 5 times each week. ? Heart-healthy fats. Healthy fats called Omega-3 fatty acids are found in foods such as flaxseeds  and coldwater fish, like sardines, salmon, and mackerel.  Limit how much you eat of the following: ? Canned or prepackaged foods. ? Food that is high in trans fat, such as fried foods. ? Food that is high in saturated fat, such as fatty meat. ? Sweets, desserts, sugary drinks, and other foods with added sugar. ? Full-fat dairy products.  Do not salt foods before eating.  Try to eat at least 2 vegetarian meals each week.  Eat more home-cooked food and less restaurant, buffet, and fast food.  When eating at a restaurant, ask that your food be prepared with less salt or no salt, if possible. What foods are recommended? The items listed may not be a complete list. Talk with your dietitian about what dietary choices are best for you. Grains Whole-grain or whole-wheat bread. Whole-grain or whole-wheat pasta. Brown rice. Orpah Cobb. Bulgur. Whole-grain and low-sodium cereals. Pita bread. Low-fat, low-sodium crackers. Whole-wheat flour tortillas. Vegetables Fresh or frozen vegetables (raw, steamed, roasted, or grilled). Low-sodium or reduced-sodium tomato and vegetable juice. Low-sodium or reduced-sodium tomato sauce and tomato paste. Low-sodium or reduced-sodium canned vegetables. Fruits All fresh, dried, or frozen fruit. Canned fruit in natural juice (without added sugar). Meat and other protein foods Skinless chicken or [place]. Ground chicken or [place]. Pork with fat trimmed off. Fish and seafood. Egg whites. Dried beans, peas, or lentils. Unsalted nuts, nut butters, and seeds. Unsalted canned beans. Lean cuts of beef with fat trimmed off. Low-sodium, lean deli meat. Dairy Low-fat (1%) or fat-free (skim) milk. Fat-free, low-fat, or reduced-fat cheeses. Nonfat, low-sodium ricotta or cottage cheese. Low-fat or nonfat yogurt. Low-fat, low-sodium cheese. Fats and oils Soft margarine without trans fats. Vegetable oil. Low-fat, reduced-fat, or light mayonnaise and salad dressings  (reduced-sodium). Canola, safflower, olive, soybean, and sunflower oils. Avocado. Seasoning and other foods Herbs. Spices. Seasoning mixes without salt. Unsalted popcorn and pretzels. Fat-free sweets. What foods are not recommended? The items listed may not be a complete list. Talk with your dietitian about what dietary choices are best for you. Grains Baked goods made with fat, such as croissants, muffins, or some breads. Dry pasta or rice meal packs. Vegetables Creamed or fried vegetables. Vegetables in a cheese sauce. Regular canned vegetables (not low-sodium or reduced-sodium). Regular canned tomato sauce and paste (not low-sodium or reduced-sodium). Regular tomato and vegetable juice (not low-sodium or reduced-sodium). Rosita Fire. Olives. Fruits Canned fruit in a light or heavy syrup. Fried fruit. Fruit in cream or butter sauce. Meat and other protein foods Fatty cuts of meat. Ribs. Fried meat. Tomasa Blase. Sausage. Bologna and other processed lunch meats. Salami. Fatback. Hotdogs. Bratwurst. Salted nuts and seeds. Canned beans with added salt. Canned or smoked fish. Whole eggs or egg yolks. Chicken or [place] with skin. Dairy Whole or 2% milk, cream, and half-and-half. Whole or full-fat cream cheese. Whole-fat or sweetened yogurt. Full-fat cheese. Nondairy creamers. Whipped toppings. Processed cheese and cheese spreads. Fats and oils Butter. Stick margarine. Lard. Shortening. Ghee. Bacon fat. Tropical oils,  such as coconut, palm kernel, or palm oil. Seasoning and other foods Salted popcorn and pretzels. Onion salt, garlic salt, seasoned salt, table salt, and sea salt. Worcestershire sauce. Tartar sauce. Barbecue sauce. Teriyaki sauce. Soy sauce, including reduced-sodium. Steak sauce. Canned and packaged gravies. Fish sauce. Oyster sauce. Cocktail sauce. Horseradish that you find on the shelf. Ketchup. Mustard. Meat flavorings and tenderizers. Bouillon cubes. Hot sauce and Tabasco sauce. Premade or  packaged marinades. Premade or packaged taco seasonings. Relishes. Regular salad dressings. Where to find more information:  National Heart, Lung, and Blood Institute: PopSteam.is  American Heart Association: www.heart.org Summary  The DASH eating plan is a healthy eating plan that has been shown to reduce high blood pressure (hypertension). It may also reduce your risk for type 2 diabetes, heart disease, and stroke.  With the DASH eating plan, you should limit salt (sodium) intake to 2,300 mg a day. If you have hypertension, you may need to reduce your sodium intake to 1,500 mg a day.  When on the DASH eating plan, aim to eat more fresh fruits and vegetables, whole grains, lean proteins, low-fat dairy, and heart-healthy fats.  Work with your health care provider or diet and nutrition specialist (dietitian) to adjust your eating plan to your individual calorie needs. This information is not intended to replace advice given to you by your health care provider. Make sure you discuss any questions you have with your health care provider.

## 2019-12-26 NOTE — Addendum Note (Signed)
Addended by: Alyson Ingles on: 12/26/2019 11:34 AM   Modules accepted: Orders

## 2019-12-30 ENCOUNTER — Telehealth: Payer: Self-pay

## 2019-12-30 NOTE — Telephone Encounter (Signed)
Left patient a message to return call to [phone] regarding assistance with completing the [facility] Patient Assistance Application form.

## 2019-12-31 ENCOUNTER — Telehealth: Payer: Self-pay | Admitting: Cardiology

## 2019-12-31 NOTE — Telephone Encounter (Signed)
*  STAT* If patient is at the pharmacy, call can be transferred to refill team.   1. Which medications need to be refilled? (please list name of each medication and dose if known)  pantoprazole (PROTONIX) 40 MG tablet   2. Which pharmacy/location (including street and city if local pharmacy) is medication to be sent to? [facility] - [place], [place] - [address]  3. Do they need a 30 day or 90 day supply? 90

## 2020-01-01 ENCOUNTER — Telehealth (HOSPITAL_COMMUNITY): Payer: Self-pay

## 2020-01-01 ENCOUNTER — Ambulatory Visit: Payer: Self-pay | Admitting: Cardiology

## 2020-01-01 MED ORDER — PANTOPRAZOLE SODIUM 40 MG PO TBEC: 40.0000 mg | DELAYED_RELEASE_TABLET | Freq: Every day | ORAL | 6 refills | Status: DC

## 2020-01-01 NOTE — Telephone Encounter (Signed)
Rx(s) sent to pharmacy electronically.  

## 2020-01-01 NOTE — Telephone Encounter (Signed)
Pt returned call. He does not want to schedule diagnostic angiogram at this time. He said he is not ready. Will call back when he is ready to schedule. AW

## 2020-01-01 NOTE — Telephone Encounter (Signed)
Called to schedule diagnostic angiogram, no answer, left vm. AW  

## 2020-01-02 NOTE — Telephone Encounter (Signed)
Pt c/o medication issue:  1. Name of Medication: pantoprazole (PROTONIX) 40 MG tablet  2. How are you currently taking this medication (dosage and times per day)? As directed  3. Are you having a reaction (difficulty breathing--STAT)? No   4. What is your medication issue? Patient's spouse states the medication was sent to the incorrect pharmacy. She is requesting to have rx resubmitted to [facility] - [place], [place] - [address]

## 2020-01-06 ENCOUNTER — Other Ambulatory Visit: Payer: Self-pay

## 2020-01-06 MED ORDER — PANTOPRAZOLE SODIUM 40 MG PO TBEC: 40.0000 mg | DELAYED_RELEASE_TABLET | Freq: Every day | ORAL | 6 refills | Status: DC

## 2020-01-10 ENCOUNTER — Encounter: Payer: Self-pay | Admitting: Pharmacist Clinician (PhC)/ Clinical Pharmacy Specialist

## 2020-01-10 ENCOUNTER — Ambulatory Visit (INDEPENDENT_AMBULATORY_CARE_PROVIDER_SITE_OTHER): Payer: Self-pay | Admitting: Pharmacist Clinician (PhC)/ Clinical Pharmacy Specialist

## 2020-01-10 ENCOUNTER — Other Ambulatory Visit: Payer: Self-pay

## 2020-01-10 DIAGNOSIS — I5022 Chronic systolic (congestive) heart failure: Secondary | ICD-10-CM

## 2020-01-10 NOTE — Assessment & Plan Note (Signed)
Patient with HFrEF, 40-45% by most recent echo, having been down to 20-25% earlier this year.  He was originally started on Entresto at the beginning of June, but held after bump in serum creatinine noted at hospitalization after syncopal episode.  It was re-started at a follow up visit with [name] on July 22.  Will continue him with same dose of Entresto, 24/26 mg for now.  Do not want to risk another syncopal episode at this time.  He is due to see Verdon Cummins for follow up in 2 weeks.  Until then, patient was asked to check home BP twice daily.  About twice weekly, he should check orthostatic readings, laying in bed, sitting on edge of bed then standing.  Based on that information he can determine whether patient would be able to tolerate increase in [place] or addition of low dose beta blocker.

## 2020-01-10 NOTE — Progress Notes (Signed)
01/10/2020 David [name] 12-30-1965 [id number]   HPI:  David [name] is a 54 y.o. male patient of Dr [name], with a PMH below who presents today for heart failure medication titration.   He was admitted to [facility] March 5 of this year with facial droop and slurred speech.  In addition to ischemic stroke, he was found to have an aneurysm arising from left ICA and a soft tissue mass along temporal bone.  An echocardiogram done during this hospitalization showed an EF at 20-25%.   A repeat echo in June showed improvement to 40-45%  At an office visit on on June 1 he was switched from losartan to Entresto 24/26 mg.  Labs drawn 2 weeks later showed a bump in SCr from 1.22 to 1.86 (35% change), however the elevated level was drawn at time of ED visit for hematemesis/GI bleed.  At the end of June was hospitalized again, for 2 days after a syncopal episode with BP in the 70's.  Sherryll Burger was held and renal function improved with hydration.  He did fracture 3 ribs in the fall.  He thn saw [name] on July 22 at which time the Sherryll Burger was re-started.  He was also given information on patient assistance to help cover costs of his medications.    Today he brings in paperwork regarding disability for Dr. 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  This information was passed on to Dr. Ludwig Clarks nurse.     Past Medical History: ASCVD Acute ischemic left MCA stroke 3/21; MI  CKD  Stage 2, GFR  Hypertension Currently controlled with Entresto, did have hypotensive episode recently     Blood Pressure Goal:  130/80  Current Medications: Entresto 24/26  Family Hx: sister had stroke at 39, doing well now; father passed when patient very young, carbon monoxide poisoning; mother died at 32 from cancer; sons are 73, 28, 36, daughters are 3, 75 - all currently healthy  Social Hx: 2 cigarettes per day; no alcohol; occasional  coffee  Diet: soda is mostly ginger ale; home cooked meals, add little salt at table to certain foods  Exercise: walking occasionally  Home BP readings: has been checking at home, readings are doing well per patient, nothing > 150 systolic/ < 80 diastolic  Intolerances: nkda  Labs:  Wt Readings from Last 3 Encounters:  01/10/20 169 lb 6.4 oz (76.8 kg)  12/26/19 171 lb 3.2 oz (77.7 kg)  12/16/19 170 lb 3.2 oz (77.2 kg)   BP Readings from Last 3 Encounters:  01/10/20 124/82  12/26/19 (!) 130/82  12/16/19 133/72   Pulse Readings from Last 3 Encounters:  01/10/20 74  12/26/19 65  12/16/19 62    Current Outpatient Medications  Medication Sig Dispense Refill  . aspirin EC 81 MG tablet Take 1 tablet (81 mg total) by mouth daily. 90 tablet 3  . atorvastatin (LIPITOR) 80 MG tablet Take 1 tablet (80 mg total) by mouth daily at 6 PM. 30 tablet 8  . nicotine (NICODERM CQ) 21 mg/24hr patch Place 1 patch (21 mg total) onto the skin daily. 28 patch 1  . pantoprazole (PROTONIX) 40 MG tablet Take 1 tablet (40 mg total) by mouth daily. 30 tablet 6  . sacubitril-valsartan (ENTRESTO) 24-26 MG Take 1 tablet by mouth 2 (two) times daily. 60 tablet 3  . traMADol (ULTRAM) 50 MG tablet Take 50 mg by mouth once.  1 tablet daily     No current facility-administered medications for this visit.    No Known Allergies  Past Medical History:  Diagnosis Date  . Acute ischemic left MCA stroke (HCC)    a. 08/2019 MRI: Acute L MCA branch vessel infarctions; b. 08/2019 CTA head/neck: Patent bilat carotids/vertebrals. High grade stenosis - inf division of prox L MCA M2 branch. additional high-grade mid-dist superior L MCA M2 branch occlusion. 58mm aneurysm arising from cavernous L ICA.  [name] Cardiomyopathy (HCC)    a. 08/2019 Echo: EF 20-25%, global HK. Gr2 DD. Nl RV fxn. RVSP 30.5mmHg.  Mildly dil LA. Mod MR.  . Cerebral aneurysm    a. 08/2019 CTA Head: 43mm aneurysm arising from the cavernous L ICA.  [name] CHF  (congestive heart failure) (HCC)   . CKD stage G2/A2, GFR 60-89 and albumin creatinine ratio 30-299 mg/g   . Facial mass    a. CT head: 7.0 x 2.3 cm soft tissue mass along lat aspect of L temporal bone, involving L ext ear - highly suscpicious for malignancy.  [name] Heart attack (HCC)   . Heart murmur   . Hypertension   . Moderate mitral regurgitation   . Nonischemic cardiomyopathy (HCC)   . PFO (patent foramen ovale)   . Stroke (HCC)   . Tobacco abuse     Blood pressure 124/82, pulse 74, height 5\' 11"  (1.803 m), weight 169 lb 6.4 oz (76.8 kg).  Chronic systolic CHF (congestive heart failure) (HCC) Patient with HFrEF, 40-45% by most recent echo, having been down to 20-25% earlier this year.  He was originally started on Entresto at the beginning of June, but held after bump in serum creatinine noted at hospitalization after syncopal episode.  It was re-started at a follow up visit with July on July 22.  Will continue him with same dose of Entresto, 24/26 mg for now.  Do not want to risk another syncopal episode at this time.  He is due to see July 24 for follow up in 2 weeks.  Until then, patient was asked to check home BP twice daily.  About twice weekly, he should check orthostatic readings, laying in bed, sitting on edge of bed then standing.  Based on that information he can determine whether patient would be able to tolerate increase in [place] or addition of low dose beta blocker.       [place] PharmD CPP [facility] [address] [place], [place] [id number]

## 2020-01-10 NOTE — Patient Instructions (Signed)
Return for a a follow up appointment with [name] on August 20  Check your blood pressure at home once or twice daily and keep record of the readings.  Take your BP meds as follows:  Continue with all current medications.     If you want to stop the pantoprazole, slowly taper the dose down every 1-2 weeks until not using any more.  Cut back to no more than 2 cigarettes per day.  After about 2 weeks, cut by another 1/2 cigarette per day.  Continue this every 2 weeks until no longer smoking   Bring all of your meds, your BP cuff and your record of home blood pressures to your next appointment.  Exercise as you're able, try to walk approximately 30 minutes per day.  Keep salt intake to a minimum, especially watch canned and prepared boxed foods.  Eat more fresh fruits and vegetables and fewer canned items.  Avoid eating in fast food restaurants.    HOW TO TAKE YOUR BLOOD PRESSURE: . Rest 5 minutes before taking your blood pressure. .  Don't smoke or drink caffeinated beverages for at least 30 minutes before. . Take your blood pressure before (not after) you eat. . Sit comfortably with your back supported and both feet on the floor (don't cross your legs). . Elevate your arm to heart level on a table or a desk. . Use the proper sized cuff. It should fit smoothly and snugly around your bare upper arm. There should be enough room to slip a fingertip under the cuff. The bottom edge of the cuff should be 1 inch above the crease of the elbow. . Ideally, take 3 measurements at one sitting and record the average.

## 2020-01-14 ENCOUNTER — Ambulatory Visit: Payer: Self-pay | Admitting: Neurology

## 2020-01-14 ENCOUNTER — Other Ambulatory Visit: Payer: Self-pay

## 2020-01-14 ENCOUNTER — Telehealth: Payer: Self-pay | Admitting: Cardiology

## 2020-01-14 ENCOUNTER — Encounter: Payer: Self-pay | Admitting: Neurology

## 2020-01-14 VITALS — BP 125/75 | HR 75 | Ht 71.0 in | Wt 172.0 lb

## 2020-01-14 DIAGNOSIS — I6990 Unspecified sequelae of unspecified cerebrovascular disease: Secondary | ICD-10-CM

## 2020-01-14 MED ORDER — ATORVASTATIN CALCIUM 80 MG PO TABS: 80.0000 mg | ORAL_TABLET | Freq: Every day | ORAL | 8 refills | Status: DC

## 2020-01-14 NOTE — Patient Instructions (Signed)
I had a long d/w patient and his fiance about his recent stroke, risk for recurrent stroke/TIAs, personally independently reviewed imaging studies and stroke evaluation results and answered questions.Continue aspirin 81 mg daily  for secondary stroke prevention and maintain strict control of hypertension with blood pressure goal below 130/90, diabetes with hemoglobin A1c goal below 6.5% and lipids with LDL cholesterol goal below 70 mg/dL. I also advised the patient to eat a healthy diet with plenty of whole grains, cereals, fruits and vegetables, exercise regularly and maintain ideal body weight.  I have counseled the patient to quit smoking completely and he says he will try.  He was given a refill for prescription for Lipitor which he was out of upon request but he was advised to get subsequent refills from his primary physician or cardiologist.  Followup in the future with my nurse practitioner Shanda Bumps in 6 months or call earlier if necessary..  Stroke Prevention Some medical conditions and behaviors are associated with a higher chance of having a stroke. You can help prevent a stroke by making nutrition, lifestyle, and other changes, including managing any medical conditions you may have. What nutrition changes can be made?   Eat healthy foods. You can do this by: ? Choosing foods high in fiber, such as fresh fruits and vegetables and whole grains. ? Eating at least 5 or more servings of fruits and vegetables a day. Try to fill half of your plate at each meal with fruits and vegetables. ? Choosing lean protein foods, such as lean cuts of meat, poultry without skin, fish, tofu, beans, and nuts. ? Eating low-fat dairy products. ? Avoiding foods that are high in salt (sodium). This can help lower blood pressure. ? Avoiding foods that have saturated fat, trans fat, and cholesterol. This can help prevent high cholesterol. ? Avoiding processed and premade foods.  Follow your health care provider's  specific guidelines for losing weight, controlling high blood pressure (hypertension), lowering high cholesterol, and managing diabetes. These may include: ? Reducing your daily calorie intake. ? Limiting your daily sodium intake to 1,500 milligrams (mg). ? Using only healthy fats for cooking, such as olive oil, canola oil, or sunflower oil. ? Counting your daily carbohydrate intake. What lifestyle changes can be made?  Maintain a healthy weight. Talk to your health care provider about your ideal weight.  Get at least 30 minutes of moderate physical activity at least 5 days a week. Moderate activity includes brisk walking, biking, and swimming.  Do not use any products that contain nicotine or tobacco, such as cigarettes and e-cigarettes. If you need help quitting, ask your health care provider. It may also be helpful to avoid exposure to secondhand smoke.  Limit alcohol intake to no more than 1 drink a day for nonpregnant women and 2 drinks a day for men. One drink equals 12 oz of beer, 5 oz of wine, or 1 oz of hard liquor.  Stop any illegal drug use.  Avoid taking birth control pills. Talk to your health care provider about the risks of taking birth control pills if: ? You are over 8 years old. ? You smoke. ? You get migraines. ? You have ever had a blood clot. What other changes can be made?  Manage your cholesterol levels. ? Eating a healthy diet is important for preventing high cholesterol. If cholesterol cannot be managed through diet alone, you may also need to take medicines. ? Take any prescribed medicines to control your cholesterol as told by  your health care provider.  Manage your diabetes. ? Eating a healthy diet and exercising regularly are important parts of managing your blood sugar. If your blood sugar cannot be managed through diet and exercise, you may need to take medicines. ? Take any prescribed medicines to control your diabetes as told by your health care  provider.  Control your hypertension. ? To reduce your risk of stroke, try to keep your blood pressure below 130/80. ? Eating a healthy diet and exercising regularly are an important part of controlling your blood pressure. If your blood pressure cannot be managed through diet and exercise, you may need to take medicines. ? Take any prescribed medicines to control hypertension as told by your health care provider. ? Ask your health care provider if you should monitor your blood pressure at home. ? Have your blood pressure checked every year, even if your blood pressure is normal. Blood pressure increases with age and some medical conditions.  Get evaluated for sleep disorders (sleep apnea). Talk to your health care provider about getting a sleep evaluation if you snore a lot or have excessive sleepiness.  Take over-the-counter and prescription medicines only as told by your health care provider. Aspirin or blood thinners (antiplatelets or anticoagulants) may be recommended to reduce your risk of forming blood clots that can lead to stroke.  Make sure that any other medical conditions you have, such as atrial fibrillation or atherosclerosis, are managed. What are the warning signs of a stroke? The warning signs of a stroke can be easily remembered as BEFAST.  B is for balance. Signs include: ? Dizziness. ? Loss of balance or coordination. ? Sudden trouble walking.  E is for eyes. Signs include: ? A sudden change in vision. ? Trouble seeing.  F is for face. Signs include: ? Sudden weakness or numbness of the face. ? The face or eyelid drooping to one side.  A is for arms. Signs include: ? Sudden weakness or numbness of the arm, usually on one side of the body.  S is for speech. Signs include: ? Trouble speaking (aphasia). ? Trouble understanding.  T is for time. ? These symptoms may represent a serious problem that is an emergency. Do not wait to see if the symptoms will go away.  Get medical help right away. Call your local emergency services (911 in the [place] Do not drive yourself to the hospital.  Other signs of stroke may include: ? A sudden, severe headache with no known cause. ? Nausea or vomiting. ? Seizure. Where to find more information For more information, visit:  American Stroke Association: www.strokeassociation.org  National Stroke Association: www.stroke.org Summary  You can prevent a stroke by eating healthy, exercising, not smoking, limiting alcohol intake, and managing any medical conditions you may have.  Do not use any products that contain nicotine or tobacco, such as cigarettes and e-cigarettes. If you need help quitting, ask your health care provider. It may also be helpful to avoid exposure to secondhand smoke.  Remember BEFAST for warning signs of stroke. Get help right away if you or a loved one has any of these signs. This information is not intended to replace advice given to you by your health care provider. Make sure you discuss any questions you have with your health care provider. Document Revised: 05/05/2017 Document Reviewed: 06/28/2016 Elsevier Patient Education  2020 [employer].

## 2020-01-14 NOTE — Progress Notes (Signed)
[facility] [address] [place]. [place] [address] [phone]       OFFICE CONSULT NOTE  [name] Date of Birth:  [birth year] Medical Record Number:  [id number]   Referring MD: Marvel Plan  Reason for Referral: Stroke  HPI: David [name] is a 54 year old African-American male seen today for initial office visit following hospital consultation for stroke.  History is obtained from the patient and his fiance as well as review of electronic medical records and personally reviewed imaging films in PACS.  Patient presented to [facility] emergency room with no known prior past medical history as a code stroke for sudden onset of slurred speech and facial droop.  CT scan of the head on admission was positive for acute/subacute infarction involving left frontal lobe and operculum.  CT angiogram did not show any large vessel occlusion but showed 7 mm left cavernous ICA aneurysm.  2D echo showed diminished ejection fraction of 20-25% with severely decreased left ventricular function and global hypokinesis but no definite clot was noted.  TEE was also performed which also did not show definite clot or cardiac source of embolism.  LDL cholesterol was 97 mg percent and hemoglobin A1c was 5.1.  Urine drug screen was positive for marijuana.  Patient was discharged on Eliquis after discussion with cardiology given strong possibility that his stroke was likely cardioembolic.  Patient has done well and is slurred speech and facial droop appears to have improved and he has no definite residual deficits.  He states he has cut back smoking and plans to quit completely soon.  He is also stopped using marijuana.  He is tolerating Eliquis well with only minor bruising and no bleeding.  Is currently wearing a 30-day Holter monitor.  He has an appointment to see cardiologist Dr. [name] next month.  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  He also has an appointment to see Dr. Corliss Skains for his cavernous carotid aneurysm but is not so sure that he wants to address this at the present time.  He had a follow-up lipid profile checked on 08/19/2019 and LDL was down to 51 mg percent. Update 01/14/2020 : He returns for follow-up after last visit 3 months ago.  Is accompanied by his fiance.  Patient was admitted in June to [facility] as he passed out at work.  He sustained 3 fractured ribs and scalp laceration.  He was found to have low blood pressure by EMS.  He was found to have mixed systolic and diastolic heart failure and cardiology saw him and change his cardiac medications and started low-dose Toprol and recommended to hold Entresto or carvedilol until the blood pressure came up.  He was thought to have syncope related to orthostasis and he improved with hydration.  Since his left the hospital Sherryll Burger has been gradually started in the low-dose.  Patient continues to smoke.  His blood pressures well controlled today it is 125/75.  He had follow-up echocardiogram done on 11/06/2019 with ejection fraction having improved to 40 to 45% and at that time Eliquis was discontinued and he was switched to aspirin 81 mg instead.  He had a repeat echo on 12/03/2019 when he was admitted with syncope and ejection fraction was slightly lower at 35 to 40%. ROS:   14 system review of systems is positive for bruising, fatigue, memory loss, syncope,  fall, fractured rib, scalp laceration, congestive heart failure tiredness, shortness of breath and all other systems negative  PMH:  Past Medical History:  Diagnosis Date  . Acute ischemic left MCA stroke (HCC)    a. 08/2019 MRI: Acute L MCA branch vessel infarctions; b. 08/2019 CTA head/neck: Patent bilat carotids/vertebrals. High grade stenosis - inf division of prox L MCA M2 branch. additional high-grade mid-dist superior L MCA M2 branch occlusion. 74mm aneurysm  arising from cavernous L ICA.  [name] Cardiomyopathy (HCC)    a. 08/2019 Echo: EF 20-25%, global HK. Gr2 DD. Nl RV fxn. RVSP 30.3mmHg.  Mildly dil LA. Mod MR.  . Cerebral aneurysm    a. 08/2019 CTA Head: 94mm aneurysm arising from the cavernous L ICA.  [name] CHF (congestive heart failure) (HCC)   . CKD stage G2/A2, GFR 60-89 and albumin creatinine ratio 30-299 mg/g   . Facial mass    a. CT head: 7.0 x 2.3 cm soft tissue mass along lat aspect of L temporal bone, involving L ext ear - highly suscpicious for malignancy.  [name] Heart attack (HCC)   . Heart murmur   . Hypertension   . Moderate mitral regurgitation   . Nonischemic cardiomyopathy (HCC)   . PFO (patent foramen ovale)   . Stroke (HCC)   . Tobacco abuse     Social History:  Social History   Socioeconomic History  . Marital status: Single    Spouse name: Not on file  . Number of children: 6  . Years of education: Not on file  . Highest education level: Not on file  Occupational History  . Not on file  Tobacco Use  . Smoking status: Current Every Day Smoker    Packs/day: 0.25    Years: 35.00    Pack years: 8.75    Types: Cigarettes  . Smokeless tobacco: Never Used  Vaping Use  . Vaping Use: Never used  Substance and Sexual Activity  . Alcohol use: No  . Drug use: Not Currently    Types: Marijuana    Comment: smokes several blunts on the weekend.  . Sexual activity: Yes  Other Topics Concern  . Not on file  Social History Narrative   ** Merged History Encounter **       Lives in [place] w/ fiancee.  Does not routinely exercise but has very busy work-life, working M-F 12 hrs shifts and another 8 hrs on Saturdays.  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   Social Determinants of Health   Financial Resource Strain:   . Difficulty of Paying Living Expenses:   Food Insecurity:   . Worried About Programme researcher, broadcasting/film/video in the Last Year:   . Barista in the Last Year:   Transportation Needs:   . Automotive engineer (Medical):   [name] Lack of Transportation (Non-Medical):   Physical Activity:   . Days of Exercise per Week:   . Minutes of Exercise per Session:   Stress:   . Feeling of Stress :   Social Connections:   . Frequency of Communication with Friends and Family:   . Frequency of Social Gatherings with Friends and Family:   . Attends Religious Services:   . Active Member of Clubs or Organizations:   . Attends Banker Meetings:   [name] Marital Status:   Intimate Partner Violence:   . Fear of Current or Ex-Partner:   . Emotionally Abused:   [name] Physically  Abused:   . Sexually Abused:     Medications:   Current Outpatient Medications on File Prior to Visit  Medication Sig Dispense Refill  . aspirin EC 81 MG tablet Take 1 tablet (81 mg total) by mouth daily. 90 tablet 3  . nicotine (NICODERM CQ) 21 mg/24hr patch Place 1 patch (21 mg total) onto the skin daily. 28 patch 1  . pantoprazole (PROTONIX) 40 MG tablet Take 1 tablet (40 mg total) by mouth daily. 30 tablet 6  . sacubitril-valsartan (ENTRESTO) 24-26 MG Take 1 tablet by mouth 2 (two) times daily. 60 tablet 3  . traMADol (ULTRAM) 50 MG tablet Take 50 mg by mouth once. 1 tablet daily     No current facility-administered medications on file prior to visit.    Allergies:  No Known Allergies  Physical Exam General: Frail middle-aged African-American male, seated, in no evident distress Head: head normocephalic and atraumatic.   Neck: supple with no carotid or supraclavicular bruits Cardiovascular: regular rate and rhythm, no murmurs Musculoskeletal: no deformity Skin:  no rash/petichiae Vascular:  Normal pulses all extremities  Neurologic Exam Mental Status: Awake and fully alert. Oriented to place and time. Recent and remote memory intact. Attention span, concentration and fund of knowledge appropriate. Mood and affect appropriate.  Cranial Nerves: Fundoscopic exam not done. Pupils equal, briskly reactive to  light. Extraocular movements full without nystagmus. Visual fields full to confrontation. Hearing intact. Facial sensation intact. Face, tongue, palate moves normally and symmetrically.  Motor: Normal bulk and tone. Normal strength in all tested extremity muscles. Sensory.: intact to touch , pinprick , position and vibratory sensation.  Coordination: Rapid alternating movements normal in all extremities. Finger-to-nose and heel-to-shin performed accurately bilaterally. Gait and Station: Arises from chair without difficulty. Stance is normal. Gait demonstrates normal stride length and balance . Able to heel, toe and tandem walk with slight t difficulty.  Reflexes: 1+ and symmetric. Toes downgoing.   NIHSS  0 Modified Rankin  0  ASSESSMENT: 54 year old African-American male with embolic left middle cerebral artery infarcts in March 2021 likely secondary to cardiomyopathy etiology from cardiomyopathy with low ejection fraction.  Vascular risk factors of hypertension, hyperlipidemia, cardiomyopathy and smoking     PLAN: I had a long d/w patient and his fiance about his recent stroke, risk for recurrent stroke/TIAs, personally independently reviewed imaging studies and stroke evaluation results and answered questions.Continue aspirin 81 mg daily  for secondary stroke prevention and maintain strict control of hypertension with blood pressure goal below 130/90, diabetes with hemoglobin A1c goal below 6.5% and lipids with LDL cholesterol goal below 70 mg/dL. I also advised the patient to eat a healthy diet with plenty of whole grains, cereals, fruits and vegetables, exercise regularly and maintain ideal body weight.  I have counseled the patient to quit smoking completely and he says he will try.  He was given a refill for prescription for Lipitor which he was out of upon request but he was advised to get subsequent refills from his primary physician or cardiologist.  Followup in the future with my nurse  practitioner David [name] in 6 months or call earlier if necessary.. Greater than 50% time during this 30-minute visit was spent on counseling and coordination of care about his stroke and cardiomyopathy and answering questions . David Heady, MD  [facility] [address] [place], [place] [address]  Phone [phone] Fax [phone] Note: This document was prepared with digital dictation and possible smart phrase technology. Any transcriptional errors  that result from this process are unintentional.

## 2020-01-14 NOTE — Telephone Encounter (Signed)
Spoke with pt wife, aware we have the paperwork, she reports the neurologist said we need to give the okay for him to return to work. 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 He has been out of work since 12-02-19. She reports he is having no problems but his ribs are still bothering him some. He has a follow up appointment with Verdon Cummins cleaver np 01-24-20. Will discuss with jesse disability paperwork.

## 2020-01-14 NOTE — Telephone Encounter (Signed)
Patient's wife calling to follow up on the disability paperwork for Dr. [name] that was dropped off 01/10/2020 at his last appointment. She also states unemployment is requesting records of his stroke and heart attack.

## 2020-01-17 NOTE — Telephone Encounter (Signed)
Paperwork complete and given to medical records.

## 2020-01-21 NOTE — Progress Notes (Signed)
Cardiology Clinic Note   Patient Name: David [name] Date of Encounter: 01/24/2020  Primary Care Provider:  Janeece Agee, NP Primary Cardiologist:  Olga Millers, MD  Patient Profile    David Roan Spinks30 year old male presents today for follow-up evaluation of his chronic systolic CHF and cardiomyopathy.  Past Medical History    Past Medical History:  Diagnosis Date  . Acute ischemic left MCA stroke (HCC)    a. 08/2019 MRI: Acute L MCA branch vessel infarctions; b. 08/2019 CTA head/neck: Patent bilat carotids/vertebrals. High grade stenosis - inf division of prox L MCA M2 branch. additional high-grade mid-dist superior L MCA M2 branch occlusion. 7mm aneurysm arising from cavernous L ICA.  [name] Cardiomyopathy (HCC)    a. 08/2019 Echo: EF 20-25%, global HK. Gr2 DD. Nl RV fxn. RVSP 30.26mmHg.  Mildly dil LA. Mod MR.  . Cerebral aneurysm    a. 08/2019 CTA Head: 7mm aneurysm arising from the cavernous L ICA.  [name] CHF (congestive heart failure) (HCC)   . CKD stage G2/A2, GFR 60-89 and albumin creatinine ratio 30-299 mg/g   . Facial mass    a. CT head: 7.0 x 2.3 cm soft tissue mass along lat aspect of L temporal bone, involving L ext ear - highly suscpicious for malignancy.  [name] Heart attack (HCC)   . Heart murmur   . Hypertension   . Moderate mitral regurgitation   . Nonischemic cardiomyopathy (HCC)   . PFO (patent foramen ovale)   . Stroke (HCC)   . Tobacco abuse    Past Surgical History:  Procedure Laterality Date  . BUBBLE STUDY  08/12/2019   Procedure: BUBBLE STUDY;  Surgeon: Parke Poisson, MD;  Location: [facility] ENDOSCOPY;  Service: Cardiology;;  . EXTERNAL EAR SURGERY    . TEE WITHOUT CARDIOVERSION N/A 08/12/2019   Procedure: TRANSESOPHAGEAL ECHOCARDIOGRAM (TEE);  Surgeon: Parke Poisson, MD;  Location[facility] ENDOSCOPY;  Service: Cardiology;  Laterality: N/A;    Allergies  No Known Allergies  History of Present Illness    [name] has a PMH of acute ischemic CVA,  chronic systolic CHF, HTN, chest pain, cardiomyopathy, marijuana use and tobacco abuse.  He was seen by cardiology on 08/11/2019 as a consult for new cardiomyopathy. He had not seen a healthcare provider in many years. He works 68 hours a week and had done this for around 20 years. His work was somewhat physical and he performed tasks without physical limitations. On 3/21 he noted chest discomfort that lasted for about 15 minutes and resolve spontaneously. He mentioned this to his fiance but then did not seek medical attention for further evaluation. On 08/09/2019 he returned home from work and was noted to have right facial droop and slurred speech. He was taken by EMS to [facility], ED. A CT of his head showed no hemorrhage however, subacute infarct in the mid left frontal lobe and left frontal operculum or identified. He was mildly hypertensive at presentation. He was also noted to have a 7 x 2.3 cm soft tissue mass along the lateral aspect of his left temporal bone. This was highly suspicious for malignancy. He was admitted and underwent CTA of his head and neck which showed high-grade stenosis in the inferior division of the proximal left MCA M2 branch and high-grade stenosis in the mid to distal posterior left MCA M2 branch. A 7 mm aneurysm arising from his left ICA was also noted. An MRI showed acute left MCA branch vessel infarctions consistent with embolic  stroke.  An echocardiogram 08/10/2019 showed an EF of 20-25% with global hypokinesis and grade 2 diastolic dysfunction. Moderate mitral valve regurgitation. He denied dyspnea on exertion, PND, orthopnea, and lower extremity edema.  During his lastcardiology visit3/03/2020 he denied chest pain and indicated he was feeling slightly better. EKG showed normal sinus rhythm with PVCs and one run of nonsustained ventricular tachycardia that lasted 15 beats. TEE 08/12/2019 showed EF 20-25%, global hypokinesis, mildly dilated left atria, and  tiny patent foramen ovale. Cardiac MRI 08/13/2019 showed severely dilated left ventricle with mild concentric hypertrophy and severely decreased systolic function LVEF 20%. Mild mitral and tricuspid regurgitation. Findings suspicious for ischemic cardiomyopathy, ischemic work-up recommended. Evidence of prethrombotic state there was no evidence of clot/thrombus however it was believed that thrombus may have been cause. Patient was anticoagulated previously by neurology service. With a plan to transition losartan to [facility] as outpatient. He was given LifeVest at discharge with a plan for possible ICD in 3 months.  He presented tothe clinic 6/1/21for follow-up evaluation and stateedhe didnot feel any different than he did prior to going to the hospital. He hadbeen wearing his LifeVest as directed. He didnot have any increased shortness of breath and denies chest pain. I stoppedhis losartan and startedhim on Entresto. IorderedBMP checked in 1 week and scheduledfollow-up in 3 to 4 weeks for further evaluation.  He was seen in follow-up evaluation by Dr. Ladona Ridgel on 11/12/2019. During that time he was doing well. His Eliquis was stopped and he was started on 81 mg aspirin. His follow-up echocardiogram 11/06/2019 showed an LVEF improvement to 40-45%. He was instructed to follow-up in 1 year.  He presented to the emergency department on 11/21/2019 with complaints of coughing up/vomiting blood. His nausea was controlled with Zofran. CT abdomen showed no acute findings. It was felt that his symptoms were related to gastroenteritis. He denied dysuria or hematuria. He was discharged in stable condition. His creatinine at the time was 1.86.  He presentedto the clinic 6/23/21and statedhis Eliquis was stopped by Dr. Ladona Ridgel and he is now taking aspirin. He had an acute episode of hematemesis and presented to the emergency department. He has had no more episodes of GI bleeding.  Hewasback at work and doing 40 hours/week. He statedhe hadsome back pain which appears to be muscular in nature. He feltoverall he hadmore energy and wasdoing well. Ididnot increase his Sherryll Burger due to his recent bump in creatinine surrounding his acute episode of GI bleeding. Iplannedfollow-up with him virtually in 1 month, and order a BMP for further evaluation.  He was recently seen in the emergency department for vasovagal symptoms on 12/02/2019. He was discharged on 12/04/2019. He was noted to have 1 syncopal episode. He was standing and moving boxes with his upper extremities. Boxes were noted to be medium weight. He lost consciousness and did not recall any prodromal symptoms. EMS was called and he was found to be diaphoretic and hypotensive with a blood pressure in 70s. He received IV hydration. EKG showed no arrhythmias. Recent CTA showed no coronary disease. His metoprolol was continued at 12.5 and his Entresto and carvedilol were held. His renal function improved with hydration. He was instructed to have outpatient renal ultrasound due to a renal lesion. He suffered a rib fracture involving the left 10th 11th and 12th ribs.  He presented to the clinic 12/12/19 for follow-up evaluation and statedhe felt well. He  had no further episodes of syncope. He stated that his ribs continued to hurt. He presented  with his wife and his wife stated that his place employment was not allowing him to claim Workmen's Comp. Due to his blood pressure  I didnot make any medication changes. I had him continue to increase his physical activity slowly. I educated about bracing ribs for pain relief with coughing sneezing, and movements. I planned  follow-up in 1 month for reevaluation.  He presented to the clinic 12/26/2019 for follow-up evaluation and stated he had slowly started to increase his physical activity.  He had been to see his PCP who prescribed tramadol for his rib pain.   He continued to try to work with his place of employment to make this case Workmen's Comp.  Him and his wife had contacted a lawyer to help him with this case.  I  stopped his metoprolol and restarted his Entresto.  We planned to have him follow-up in 4 weeks for reevaluation and possible up titration.  I had him continue to increase his physical activity slowly as tolerated.  We gave patient assistance contact information for help with his medication cost.  He presents in the clinic today for follow-up evaluation and states he is feeling much better with adding Entresto back to his medication regimen.  He has not been approved yet for patient assistance and has no insurance.  We have given Entresto samples today.[name] He does state however that he has had episodes of dizziness upon standing.  He wishes to return to work so that he may regain his insurance.  His blood pressures have been stable at home.  I will ask him to return to clinic for follow-up in 2 weeks to reevaluate his dizziness and repeat his echocardiogram in 1 month.  I will check his BMP today.  Today hedenies chest pain, shortness of breath, lower extremity edema, fatigue, palpitations, melena, hematuria, hemoptysis, diaphoresis, weakness, presyncope, syncope, orthopnea, and PND.   Home Medications    Prior to Admission medications   Medication Sig Start Date End Date Taking? Authorizing Provider  aspirin EC 81 MG tablet Take 1 tablet (81 mg total) by mouth daily. 11/12/19   Marinus Maw, MD  atorvastatin (LIPITOR) 80 MG tablet Take 1 tablet (80 mg total) by mouth daily at 6 PM. 01/14/20   Micki Riley, MD  nicotine (NICODERM CQ) 21 mg/24hr patch Place 1 patch (21 mg total) onto the skin daily. 09/12/19   Janeece Agee, NP  pantoprazole (PROTONIX) 40 MG tablet Take 1 tablet (40 mg total) by mouth daily. 01/06/20   Lewayne Bunting, MD  sacubitril-valsartan (ENTRESTO) 24-26 MG Take 1 tablet by mouth 2 (two) times daily. 12/26/19    Ronney Asters, NP  traMADol (ULTRAM) 50 MG tablet Take 50 mg by mouth once. 1 tablet daily    [provider]    Family History    Family History  Problem Relation Age of Onset  . Stroke Sister   . Cancer Mother        died @ 16  . Other Father        died of CO poisoning as a young man.   He indicated that his mother is deceased. He indicated that his father is deceased. He indicated that his sister is alive.  Social History    Social History   Socioeconomic History  . Marital status: Single    Spouse name: Not on file  . Number of children: 6  . Years of education: Not on file  . Highest education level:  Not on file  Occupational History  . Not on file  Tobacco Use  . Smoking status: Current Every Day Smoker    Packs/day: 0.25    Years: 35.00    Pack years: 8.75    Types: Cigarettes  . Smokeless tobacco: Never Used  Vaping Use  . Vaping Use: Never used  Substance and Sexual Activity  . Alcohol use: No  . Drug use: Not Currently    Types: Marijuana    Comment: smokes several blunts on the weekend.  . Sexual activity: Yes  Other Topics Concern  . Not on file  Social History Narrative   ** Merged History Encounter **       Lives in [place] w/ fiancee.  Does not routinely exercise but has very busy work-life, working M-F 12 hrs shifts and another 8 hrs on Saturdays.  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   Social Determinants of Health   Financial Resource Strain:   . Difficulty of Paying Living Expenses: Not on file  Food Insecurity:   . Worried About Programme researcher, broadcasting/film/video in the Last Year: Not on file  . Ran Out of Food in the Last Year: Not on file  Transportation Needs:   . Lack of Transportation (Medical): Not on file  . Lack of Transportation (Non-Medical): Not on file  Physical Activity:   . Days of Exercise per Week: Not on file  . Minutes of Exercise per Session: Not on file  Stress:   . Feeling of Stress : Not on  file  Social Connections:   . Frequency of Communication with Friends and Family: Not on file  . Frequency of Social Gatherings with Friends and Family: Not on file  . Attends Religious Services: Not on file  . Active Member of Clubs or Organizations: Not on file  . Attends Banker Meetings: Not on file  . Marital Status: Not on file  Intimate Partner Violence:   . Fear of Current or Ex-Partner: Not on file  . Emotionally Abused: Not on file  . Physically Abused: Not on file  . Sexually Abused: Not on file     Review of Systems    General:  No chills, fever, night sweats or weight changes.  Cardiovascular:  No chest pain, dyspnea on exertion, edema, orthopnea, palpitations, paroxysmal nocturnal dyspnea. Dermatological: No rash, lesions/masses Respiratory: No cough, dyspnea Urologic: No hematuria, dysuria Abdominal:   No nausea, vomiting, diarrhea, bright red blood per rectum, melena, or hematemesis Neurologic:  No visual changes, wkns, changes in mental status. All other systems reviewed and are otherwise negative except as noted above.  Physical Exam    VS:  BP 124/62   Pulse 82   Ht  (1.803 m)   Wt 172 lb (78 kg)   SpO2 97%   BMI 23.99 kg/m  , BMI Body mass index is 23.99 kg/m. GEN: Well nourished, well developed, in no acute distress. HEENT: normal. Neck: Supple, no JVD, carotid bruits, or masses. Cardiac: RRR, no murmurs, rubs, or gallops. No clubbing, cyanosis, edema.  Radials/DP/PT 2+ and equal bilaterally.  Respiratory:  Respirations regular and unlabored, clear to auscultation bilaterally. GI: Soft, nontender, nondistended, BS + x 4. MS: no deformity or atrophy. Skin: warm and dry, no rash. Neuro:  Strength and sensation are intact. Psych: Normal affect.  Accessory Clinical Findings    Recent Labs: 08/14/2019: Magnesium 2.1 08/21/2019: TSH 1.840 12/16/2019: ALT 143; BUN 16; Creatinine, Ser  0.96; Hemoglobin 12.7; Platelets 231; Potassium 3.8;  Sodium 144   Recent Lipid Panel    Component Value Date/Time   CHOL 93 (L) 08/21/2019 0908   TRIG 57 08/21/2019 0908   HDL 29 (L) 08/21/2019 0908   CHOLHDL 3.2 08/21/2019 0908   CHOLHDL 4.4 08/10/2019 0439   VLDL 18 08/10/2019 0439   LDLCALC 51 08/21/2019 0908    ECG personally reviewed by me today-none today.  EKG 11/27/2019 normal sinus rhythm 75 bpm T wave abnormality consider inferior ischemia no acute changes  EKG 08/09/2019 Sinus rhythm 80 bpm  Cardiac MRI 08/13/2019 IMPRESSION: 1. Severely dilated left ventricle with mild concentric hypertrophy and severely decreased systolic function (LVEF = 20%). There is severe diffuse hypokinesis with akinesis in the basal, mid and apical inferior wall. There is transmural late gadolinium enhancement in the basal, mid and apical inferior walls. There is a heavy smoke/sludge in the left ventricular apex consistent with pre-thrombotic state. Systemic anticoagulation is recommended.  2. Normal right ventricular size, thickness and mildly decreased systolic function (LVEF = 39%). There are no regional wall motion abnormalities.  3. Normal left and right atrial size.  4. Normal size of the aortic root, ascending aorta and pulmonary artery.  5. Mild mitral and tricuspid regurgitation.  6. Normal pericardium. No pericardial effusion.  These findings are suspicious for an ischemic cardiomyopathy, an ischemic workup is recommended.  Echocardiogram 09/04/2019 IMPRESSIONS    1. No LV thombus is seen. Left ventricular ejection fraction, by  estimation, is 20 to 25%. The left ventricle has severely decreased  function. The left ventricle demonstrates global hypokinesis. The left  ventricular internal cavity size was severely  dilated. Left ventricular diastolic parameters are consistent with Grade  II diastolic dysfunction (pseudonormalization). Elevated left atrial  pressure.  2. Right ventricular systolic function is  normal. The right ventricular  size is normal. There is normal pulmonary artery systolic pressure. The  estimated right ventricular systolic pressure is 25.3 mmHg.  3. Left atrial size was mildly dilated.  4. The mitral valve is normal in structure. Mild to moderate mitral valve  regurgitation.  5. The aortic valve is normal in structure. Aortic valve regurgitation is  not visualized.  6. The inferior vena cava is normal in size with greater than 50%  respiratory variability, suggesting right atrial pressure of 3 mmHg.   Comparison(s): Prior images reviewed side by side. Changes from prior  study are noted. Although LV systolic function is unchanged, there is less  mitral regurgitation and the signs of high left atrial pressure are  improved.  ECHO TEE 08/12/2019  1. Left ventricular ejection fraction, by estimation, is 20 to 25%. The  left ventricle has severely decreased function. The left ventricle  demonstrates global hypokinesis. The left ventricular internal cavity size  was moderately dilated.  2. Right ventricular systolic function is normal. The right ventricular  size is normal.  3. Left atrial size was mildly dilated. No left atrial/left atrial  appendage thrombus was detected. The LAA emptying velocity was 105 cm/s.  4. The mitral valve is grossly normal. Mild mitral valve regurgitation.  5. The aortic valve is tricuspid. Aortic valve regurgitation is trivial.  6. There is borderline dilatation of the ascending aorta measuring 38 mm.  There is mild (Grade II) atheroma plaque involving the transverse and  descending aorta.  7. Evidence of atrial level shunting detected by color flow Doppler.  Agitated saline contrast bubble study was positive with shunting observed  within 3-6  cardiac cycles suggestive of interatrial shunt. There is a tiny  patent foramen ovale.    Echocardiogram 11/06/2019 IMPRESSIONS    1. Left ventricular ejection fraction, by  estimation, is 40 to 45%. The  left ventricle has mildly decreased function. The left ventricle  demonstrates global hypokinesis. There is severe hypokinesis of the left  ventricular, entire anteroseptal wall and  inferoseptal wall.The left ventricular internal cavity size was severely  dilated. Left ventricular diastolic parameters are consistent with Grade I  diastolic dysfunction (impaired relaxation). Elevated left ventricular  end-diastolic pressure. The LV  strain was performed but visually does not appear accurate so is not  reported.  2. Right ventricular systolic function is normal. The right ventricular  size is normal. Tricuspid regurgitation signal is inadequate for assessing  PA pressure.  3. The mitral valve is normal in structure. Mild mitral valve  regurgitation. No evidence of mitral stenosis.  4. The aortic valve is tricuspid. Aortic valve regurgitation is not  visualized. Mild aortic valve sclerosis is present, with no evidence of  aortic valve stenosis.  5. Aortic dilatation noted. There is mild dilatation of the ascending  aorta measuring 39 mm.  6. The inferior vena cava is normal in size with greater than 50%  respiratory variability, suggesting right atrial pressure of 3 mmHg.  7. Compared to prior echo 09/04/2019, LVF has improved.   Assessment & Plan   1.  Cardiomyopathy-continues to slowly increase his physical activity.  No increased dyspnea or activity intolerance . Echocardiogram 09/04/2019 stable compared to previous LVEF 20-25%, G2 DD, trivial aortic valve regurgitation, left atrium mildly dilated.Echocardiogram 6/2/2021showed an LVEF improvement to 40-45%.Blood pressure [id number]. Continue Entresto 24/26 unable to titrate due to occasional episodes of dizziness.  Samples given. Heart healthy low-sodium diet-salty 6 given Increase physical activity as tolerated Ischemic evaluation plan for after CVA recovery. Order BMP    Syncope-no  further syncopal episodes.  Has started to slowly increase his physical activity and is walking daily.Seen in the emergency department for vasovagal symptoms on 12/02/2019. He was discharged on 12/04/2019. He was noted to have 1 syncopal episode. He was standing and moving boxes with his upper extremitiesat his place of employment.. Continue p.o. hydration Increase physical activity slowly Brace ribs with coughing/sneezing for pain relief  Essential hypertension-BP [id number].   Well-controlled at home. Blood pressure cuff given at last appointment Continue Metoprolol Heart healthy low-sodium diet-salty 6 given Increase physical activity as tolerated Blood pressure log  L MCA CVA-TEE showed PFO, no clot/thrombus. EP consulted for loop recorder placement. He was not a candidate for loop given his low EF and possible need for ICD.   Disposition: Follow-up with me or Dr. [name] in 2 weeks.   Thomasene Ripple. Jubilee Vivero NP-C    01/24/2020, 3:40 PM [facility] [address] Office [phone] Fax [phone]  Notice: This dictation was prepared with Dragon dictation along with smaller phrase technology. Any transcriptional errors that result from this process are unintentional and may not be corrected upon review.

## 2020-01-24 ENCOUNTER — Encounter: Payer: Self-pay | Admitting: General Practice

## 2020-01-24 ENCOUNTER — Ambulatory Visit (INDEPENDENT_AMBULATORY_CARE_PROVIDER_SITE_OTHER): Payer: Self-pay | Admitting: General Practice

## 2020-01-24 ENCOUNTER — Other Ambulatory Visit: Payer: Self-pay

## 2020-01-24 VITALS — BP 124/62 | HR 82 | Ht 71.0 in | Wt 172.0 lb

## 2020-01-24 DIAGNOSIS — I10 Essential (primary) hypertension: Secondary | ICD-10-CM

## 2020-01-24 DIAGNOSIS — I429 Cardiomyopathy, unspecified: Secondary | ICD-10-CM

## 2020-01-24 DIAGNOSIS — R55 Syncope and collapse: Secondary | ICD-10-CM

## 2020-01-24 DIAGNOSIS — I639 Cerebral infarction, unspecified: Secondary | ICD-10-CM

## 2020-01-24 DIAGNOSIS — Z79899 Other long term (current) drug therapy: Secondary | ICD-10-CM

## 2020-01-24 NOTE — Patient Instructions (Signed)
Medication Instructions:  STAY ON ENTRESTO 24/26 *If you need a refill on your cardiac medications before your next appointment, please call your pharmacy*  Special Instructions MAKE SURE TO FILL OUT FORMS AND BRING BACK FOR [place] TO FAX ON Monday.  DUE TO MANUFACTURER'S SAMPLE ISSUE WE ARE UNABLE TO PROVIDE SAMPLES.  Follow-Up: Your next appointment:  2 week(s)   In Person with [name], FNP  At [facility], you and your health needs are our priority.  As part of our continuing mission to provide you with exceptional heart care, we have created designated Provider Care Teams.  These Care Teams include your primary Cardiologist (physician) and Advanced Practice Providers (APPs -  Physician Assistants and Nurse Practitioners) who all work together to provide you with the care you need, when you need it.  We recommend signing up for the patient portal called "MyChart".  Sign up information is provided on this After Visit Summary.  MyChart is used to connect with patients for Virtual Visits (Telemedicine).  Patients are able to view lab/test results, encounter notes, upcoming appointments, etc.  Non-urgent messages can be sent to your provider as well.   To learn more about what you can do with MyChart, go to [email or web address].

## 2020-01-25 LAB — BASIC METABOLIC PANEL
BUN/Creatinine Ratio: 10 (ref 9–20)
BUN: 13 mg/dL (ref 6–24)
CO2: 27 mmol/L (ref 20–29)
Calcium: 9.4 mg/dL (ref 8.7–10.2)
Chloride: 105 mmol/L (ref 96–106)
Creatinine, Ser: 1.24 mg/dL (ref 0.76–1.27)
GFR calc Af Amer: 75 mL/min/{1.73_m2} (ref >59)
GFR calc non Af Amer: 65 mL/min/{1.73_m2} (ref >59)
Glucose: 80 mg/dL (ref 65–99)
Potassium: 5.1 mmol/L (ref 3.5–5.2)
Sodium: 146 mmol/L — ABNORMAL HIGH (ref 134–144)

## 2020-02-11 ENCOUNTER — Ambulatory Visit: Payer: Self-pay | Admitting: General Practice

## 2020-02-11 NOTE — Progress Notes (Deleted)
8  Cardiology Clinic Note   Patient Name: David [name] Date of Encounter: 02/11/2020  Primary Care Provider:  Janeece Agee, NP Primary Cardiologist:  David Millers, MD  Patient Profile    David Roan Spinks40 year old male presents today for follow-up evaluation of his chronic systolic CHF and cardiomyopathy.  Past Medical History    Past Medical History:  Diagnosis Date   Acute ischemic left MCA stroke (HCC)    a. 08/2019 MRI: Acute L MCA branch vessel infarctions; b. 08/2019 CTA head/neck: Patent bilat carotids/vertebrals. High grade stenosis - inf division of prox L MCA M2 branch. additional high-grade mid-dist superior L MCA M2 branch occlusion. 21mm aneurysm arising from cavernous L ICA.   Cardiomyopathy (HCC)    a. 08/2019 Echo: EF 20-25%, global HK. Gr2 DD. Nl RV fxn. RVSP 30.44mmHg.  Mildly dil LA. Mod MR.   Cerebral aneurysm    a. 08/2019 CTA Head: 26mm aneurysm arising from the cavernous L ICA.   CHF (congestive heart failure) (HCC)    CKD stage G2/A2, GFR 60-89 and albumin creatinine ratio 30-299 mg/g    Facial mass    a. CT head: 7.0 x 2.3 cm soft tissue mass along lat aspect of L temporal bone, involving L ext ear - highly suscpicious for malignancy.   Heart attack (HCC)    Heart murmur    Hypertension    Moderate mitral regurgitation    Nonischemic cardiomyopathy (HCC)    PFO (patent foramen ovale)    Stroke (HCC)    Tobacco abuse    Past Surgical History:  Procedure Laterality Date   BUBBLE STUDY  08/12/2019   Procedure: BUBBLE STUDY;  Surgeon: Parke Poisson, MD;  Location: [facility] ENDOSCOPY;  Service: Cardiology;;   EXTERNAL EAR SURGERY     TEE WITHOUT CARDIOVERSION N/A 08/12/2019   Procedure: TRANSESOPHAGEAL ECHOCARDIOGRAM (TEE);  Surgeon: Parke Poisson, MD;  Location[facility] ENDOSCOPY;  Service: Cardiology;  Laterality: N/A;    Allergies  No Known Allergies  History of Present Illness    David [name] has a PMH of acute ischemic CVA,  chronic systolic CHF, HTN, chest pain, cardiomyopathy, marijuana use and tobacco abuse.  He was seen by cardiology on 08/11/2019 as a consult for new cardiomyopathy. He had not seen a healthcare provider in many years. He works 68 hours a week and had done this for around 20 years. His work was somewhat physical and he performed tasks without physical limitations. On 3/21 he noted chest discomfort that lasted for about 15 minutes and resolve spontaneously. He mentioned this to his fiance but then did not seek medical attention for further evaluation. On 08/09/2019 he returned home from work and was noted to have right facial droop and slurred speech. He was taken by EMS to [facility], ED. A CT of his head showed no hemorrhage however, subacute infarct in the mid left frontal lobe and left frontal operculum or identified. He was mildly hypertensive at presentation. He was also noted to have a 7 x 2.3 cm soft tissue mass along the lateral aspect of his left temporal bone. This was highly suspicious for malignancy. He was admitted and underwent CTA of his head and neck which showed high-grade stenosis in the inferior division of the proximal left MCA M2 branch and high-grade stenosis in the mid to distal posterior left MCA M2 branch. A 7 mm aneurysm arising from his left ICA was also noted. An MRI showed acute left MCA branch vessel infarctions consistent with  embolic stroke.  An echocardiogram 08/10/2019 showed an EF of 20-25% with global hypokinesis and grade 2 diastolic dysfunction. Moderate mitral valve regurgitation. He denied dyspnea on exertion, PND, orthopnea, and lower extremity edema.  During his lastcardiology visit3/03/2020 he denied chest pain and indicated he was feeling slightly better. EKG showed normal sinus rhythm with PVCs and one run of nonsustained ventricular tachycardia that lasted 15 beats. TEE 08/12/2019 showed EF 20-25%, global hypokinesis, mildly dilated left atria, and  tiny patent foramen ovale. Cardiac MRI 08/13/2019 showed severely dilated left ventricle with mild concentric hypertrophy and severely decreased systolic function LVEF 20%. Mild mitral and tricuspid regurgitation. Findings suspicious for ischemic cardiomyopathy, ischemic work-up recommended. Evidence of prethrombotic state there was no evidence of clot/thrombus however it was believed that thrombus may have been cause. Patient was anticoagulated previously by neurology service. With a plan to transition losartan to [facility] as outpatient. He was given LifeVest at discharge with a plan for possible ICD in 3 months.  He presented tothe clinic 6/1/21for follow-up evaluation and stateedhe didnot feel any different than he did prior to going to the hospital. He hadbeen wearing his LifeVest as directed. He didnot have any increased shortness of breath and denies chest pain. I stoppedhis losartan and startedhim on Entresto. IorderedBMP checked in 1 week and scheduledfollow-up in 3 to 4 weeks for further evaluation.  He was seen in follow-up evaluation by Dr. Ladona Ridgel on 11/12/2019. During that time he was doing well. His Eliquis was stopped and he was started on 81 mg aspirin. His follow-up echocardiogram 11/06/2019 showed an LVEF improvement to 40-45%. He was instructed to follow-up in 1 year.  He presented to the emergency department on 11/21/2019 with complaints of coughing up/vomiting blood. His nausea was controlled with Zofran. CT abdomen showed no acute findings. It was felt that his symptoms were related to gastroenteritis. He denied dysuria or hematuria. He was discharged in stable condition. His creatinine at the time was 1.86.  He presentedto the clinic 6/23/21and statedhis Eliquis was stopped by Dr. Ladona Ridgel and he is now taking aspirin. He had an acute episode of hematemesis and presented to the emergency department. He has had no more episodes of GI bleeding.  Hewasback at work and doing 40 hours/week. He statedhe hadsome back pain which appears to be muscular in nature. He feltoverall he hadmore energy and wasdoing well. Ididnot increase his Sherryll Burger due to his recent bump in creatinine surrounding his acute episode of GI bleeding. Iplannedfollow-up with him virtually in 1 month, and order a BMP for further evaluation.  He was recently seen in the emergency department for vasovagal symptoms on 12/02/2019. He was discharged on 12/04/2019. He was noted to have 1 syncopal episode. He was standing and moving boxes with his upper extremities. Boxes were noted to be medium weight. He lost consciousness and did not recall any prodromal symptoms. EMS was called and he was found to be diaphoretic and hypotensive with a blood pressure in 70s. He received IV hydration. EKG showed no arrhythmias. Recent CTA showed no coronary disease. His metoprolol was continued at 12.5 and his Entresto and carvedilol were held. His renal function improved with hydration. He was instructed to have outpatient renal ultrasound due to a renal lesion. He suffered a rib fracture involving the left 10th 11th and 12th ribs.  He presentedto the clinic 7/8/21for follow-up evaluation and statedhe feltwell. He had no further episodes of syncope. He statedthat his ribs continuedto hurt. He presentedwith his wife and his wife  statedthat his place employmentwasnot allowing him to claimWorkmen's Comp. Due to his blood pressure I didnot make any medication changes. I hadhim continue to increase his physical activity slowly. I educated about bracing ribs for pain relief with coughing sneezing, and movements. Iplannedfollow-up in 1 month for reevaluation.  He presented to the clinic 12/26/2019 for follow-up evaluation and statedhe had slowly started to increase his physical activity. He had been to see his PCP who prescribed tramadol for his rib pain.  He continued to try to work with his place of employment to make this case Workmen's Comp. Him and his wife had contacted a lawyer to help him with this case. I  stopped his metoprolol and restarted his Entresto. We planned to have him follow-up in 4 weeks for reevaluation and possible up titration. I had him continue to increase his physical activity slowly as tolerated. We gave patient assistance contact information for help with his medication cost.  He presented to the clinic 8/202021 for follow-up evaluation and stated he was feeling much better with adding Entresto back to his medication regimen.  He had not been approved yet for patient assistance and had no insurance.  We  gave Entresto samples. He did state however that he  had episodes of dizziness upon standing.  He wished to return to work so that he may regain his insurance.  His blood pressures had been stable at home.  I  asked him to return to clinic for follow-up in 2 weeks to reevaluate his dizziness and repeat his echocardiogram in 1 month.  I  Repeated his BMP.  He presents to the clinic today for follow-up evaluation and states***  Today hedenies chest pain, shortness of breath, lower extremity edema, fatigue, palpitations, melena, hematuria, hemoptysis, diaphoresis, weakness, presyncope, syncope, orthopnea, and PND.  Home Medications    Prior to Admission medications   Medication Sig Start Date End Date Taking? Authorizing Provider  Acetaminophen (TYLENOL PO) Take by mouth.    [provider]  aspirin EC 81 MG tablet Take 1 tablet (81 mg total) by mouth daily. 11/12/19   Marinus Maw, MD  atorvastatin (LIPITOR) 80 MG tablet Take 1 tablet (80 mg total) by mouth daily at 6 PM. 01/14/20   Micki Riley, MD  nicotine (NICODERM CQ) 21 mg/24hr patch Place 1 patch (21 mg total) onto the skin daily. 09/12/19   David Agee, NP  pantoprazole (PROTONIX) 40 MG tablet Take 1 tablet (40 mg total) by mouth daily. 01/06/20    Lewayne Bunting, MD  sacubitril-valsartan (ENTRESTO) 24-26 MG Take 1 tablet by mouth 2 (two) times daily. 12/26/19   Ronney Asters, NP    Family History    Family History  Problem Relation Age of Onset   Stroke Sister    Cancer Mother        died @ 68   Other Father        died of CO poisoning as a young man.   He indicated that his mother is deceased. He indicated that his father is deceased. He indicated that his sister is alive.  Social History    Social History   Socioeconomic History   Marital status: Single    Spouse name: Not on file   Number of children: 6   Years of education: Not on file   Highest education level: Not on file  Occupational History   Not on file  Tobacco Use   Smoking status: Current Every Day Smoker  Packs/day: 0.25    Years: 35.00    Pack years: 8.75    Types: Cigarettes   Smokeless tobacco: Never Used  Vaping Use   Vaping Use: Never used  Substance and Sexual Activity   Alcohol use: No   Drug use: Not Currently    Types: Marijuana    Comment: smokes several blunts on the weekend.   Sexual activity: Yes  Other Topics Concern   Not on file  Social History Narrative   ** Merged History Encounter **       Lives in [place] w/ fiancee.  Does not routinely exercise but has very busy work-life, working M-F 12 hrs shifts and another 8 hrs on Saturdays.  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   Social Determinants of Health   Financial Resource Strain:    Difficulty of Paying Living Expenses: Not on file  Food Insecurity:    Worried About Programme researcher, broadcasting/film/video in the Last Year: Not on file   [employer] of Food in the Last Year: Not on file  Transportation Needs:    Lack of Transportation (Medical): Not on file   Lack of Transportation (Non-Medical): Not on file  Physical Activity:    Days of Exercise per Week: Not on file   Minutes of Exercise per Session: Not on file  Stress:    Feeling of  Stress : Not on file  Social Connections:    Frequency of Communication with Friends and Family: Not on file   Frequency of Social Gatherings with Friends and Family: Not on file   Attends Religious Services: Not on file   Active Member of Clubs or Organizations: Not on file   Attends Banker Meetings: Not on file   Marital Status: Not on file  Intimate Partner Violence:    Fear of Current or Ex-Partner: Not on file   Emotionally Abused: Not on file   Physically Abused: Not on file   Sexually Abused: Not on file     Review of Systems    General:  No chills, fever, night sweats or weight changes.  Cardiovascular:  No chest pain, dyspnea on exertion, edema, orthopnea, palpitations, paroxysmal nocturnal dyspnea. Dermatological: No rash, lesions/masses Respiratory: No cough, dyspnea Urologic: No hematuria, dysuria Abdominal:   No nausea, vomiting, diarrhea, bright red blood per rectum, melena, or hematemesis Neurologic:  No visual changes, wkns, changes in mental status. All other systems reviewed and are otherwise negative except as noted above.  Physical Exam    VS:  There were no vitals taken for this visit. , BMI There is no height or weight on file to calculate BMI. GEN: Well nourished, well developed, in no acute distress. HEENT: normal. Neck: Supple, no JVD, carotid bruits, or masses. Cardiac: RRR, no murmurs, rubs, or gallops. No clubbing, cyanosis, edema.  Radials/DP/PT 2+ and equal bilaterally.  Respiratory:  Respirations regular and unlabored, clear to auscultation bilaterally. GI: Soft, nontender, nondistended, BS + x 4. MS: no deformity or atrophy. Skin: warm and dry, no rash. Neuro:  Strength and sensation are intact. Psych: Normal affect.  Accessory Clinical Findings    Recent Labs: 08/14/2019: Magnesium 2.1 08/21/2019: TSH 1.840 12/16/2019: ALT 143; Hemoglobin 12.7; Platelets 231 01/24/2020: BUN 13; Creatinine, Ser 1.24; Potassium 5.1;  Sodium 146   Recent Lipid Panel    Component Value Date/Time   CHOL 93 (L) 08/21/2019 0908   TRIG 57 08/21/2019 0908   HDL 29 (L) 08/21/2019 0947  CHOLHDL 3.2 08/21/2019 0908   CHOLHDL 4.4 08/10/2019 0439   VLDL 18 08/10/2019 0439   LDLCALC 51 08/21/2019 0908    ECG personally reviewed by me today- *** - No acute changes  EKG 11/27/2019 normal sinus rhythm 75 bpm T wave abnormality consider inferior ischemia no acute changes  EKG 08/09/2019 Sinus rhythm 80 bpm  Cardiac MRI 08/13/2019 IMPRESSION: 1. Severely dilated left ventricle with mild concentric hypertrophy and severely decreased systolic function (LVEF = 20%). There is severe diffuse hypokinesis with akinesis in the basal, mid and apical inferior wall. There is transmural late gadolinium enhancement in the basal, mid and apical inferior walls. There is a heavy smoke/sludge in the left ventricular apex consistent with pre-thrombotic state. Systemic anticoagulation is recommended.  2. Normal right ventricular size, thickness and mildly decreased systolic function (LVEF = 39%). There are no regional wall motion abnormalities.  3. Normal left and right atrial size.  4. Normal size of the aortic root, ascending aorta and pulmonary artery.  5. Mild mitral and tricuspid regurgitation.  6. Normal pericardium. No pericardial effusion.  These findings are suspicious for an ischemic cardiomyopathy, an ischemic workup is recommended.  Echocardiogram 09/04/2019 IMPRESSIONS    1. No LV thombus is seen. Left ventricular ejection fraction, by  estimation, is 20 to 25%. The left ventricle has severely decreased  function. The left ventricle demonstrates global hypokinesis. The left  ventricular internal cavity size was severely  dilated. Left ventricular diastolic parameters are consistent with Grade  II diastolic dysfunction (pseudonormalization). Elevated left atrial  pressure.  2. Right ventricular systolic  function is normal. The right ventricular  size is normal. There is normal pulmonary artery systolic pressure. The  estimated right ventricular systolic pressure is 25.3 mmHg.  3. Left atrial size was mildly dilated.  4. The mitral valve is normal in structure. Mild to moderate mitral valve  regurgitation.  5. The aortic valve is normal in structure. Aortic valve regurgitation is  not visualized.  6. The inferior vena cava is normal in size with greater than 50%  respiratory variability, suggesting right atrial pressure of 3 mmHg.   Comparison(s): Prior images reviewed side by side. Changes from prior  study are noted. Although LV systolic function is unchanged, there is less  mitral regurgitation and the signs of high left atrial pressure are  improved.  ECHO TEE 08/12/2019  1. Left ventricular ejection fraction, by estimation, is 20 to 25%. The  left ventricle has severely decreased function. The left ventricle  demonstrates global hypokinesis. The left ventricular internal cavity size  was moderately dilated.  2. Right ventricular systolic function is normal. The right ventricular  size is normal.  3. Left atrial size was mildly dilated. No left atrial/left atrial  appendage thrombus was detected. The LAA emptying velocity was 105 cm/s.  4. The mitral valve is grossly normal. Mild mitral valve regurgitation.  5. The aortic valve is tricuspid. Aortic valve regurgitation is trivial.  6. There is borderline dilatation of the ascending aorta measuring 38 mm.  There is mild (Grade II) atheroma plaque involving the transverse and  descending aorta.  7. Evidence of atrial level shunting detected by color flow Doppler.  Agitated saline contrast bubble study was positive with shunting observed  within 3-6 cardiac cycles suggestive of interatrial shunt. There is a tiny  patent foramen ovale.    Echocardiogram 11/06/2019 IMPRESSIONS    1. Left ventricular ejection  fraction, by estimation, is 40 to 45%. The  left ventricle  has mildly decreased function. The left ventricle  demonstrates global hypokinesis. There is severe hypokinesis of the left  ventricular, entire anteroseptal wall and  inferoseptal wall.The left ventricular internal cavity size was severely  dilated. Left ventricular diastolic parameters are consistent with Grade I  diastolic dysfunction (impaired relaxation). Elevated left ventricular  end-diastolic pressure. The LV  strain was performed but visually does not appear accurate so is not  reported.  2. Right ventricular systolic function is normal. The right ventricular  size is normal. Tricuspid regurgitation signal is inadequate for assessing  PA pressure.  3. The mitral valve is normal in structure. Mild mitral valve  regurgitation. No evidence of mitral stenosis.  4. The aortic valve is tricuspid. Aortic valve regurgitation is not  visualized. Mild aortic valve sclerosis is present, with no evidence of  aortic valve stenosis.  5. Aortic dilatation noted. There is mild dilatation of the ascending  aorta measuring 39 mm.  6. The inferior vena cava is normal in size with greater than 50%  respiratory variability, suggesting right atrial pressure of 3 mmHg.  7. Compared to prior echo 09/04/2019, LVF has improved.  Assessment & Plan   1.  Cardiomyopathy-increasing  his physical activity. No increased dyspnea or activity intolerance . Echocardiogram 09/04/2019 stable compared to previous LVEF 20-25%, G2 DD, trivial aortic valve regurgitation, left atrium mildly dilated.Echocardiogram 6/2/2021showed an LVEF improvement to 40-45%.Blood pressure [id number].*** BMP stable Continue Entresto 24/26 unable to titrate due to occasional episodes of dizziness.  Samples given.*** Heart healthy low-sodium diet-salty 6 given Increase physical activity as tolerated Ischemic evaluation plan for after CVA recovery. Order  echocardiogram  Syncope-no further/recent syncopal episodes.Continues walking daily. Was seen in the emergency department for vasovagal symptoms on 12/02/2019. He was discharged on 12/04/2019.  He was standing and moving boxes with his upper extremitiesat his place of employment.. Continue p.o. hydration  Essential hypertension-BP [id number].***Well-controlled at home. Blood pressure cuff givenat last appointment Continue Metoprolol Heart healthy low-sodium diet-salty 6 given Increase physical activity as tolerated Blood pressure log  L MCA CVA-TEE showed PFO, no clot/thrombus. EP consulted for loop recorder placement. He was not a candidate for loop given his low EF and possible need for ICD.   Disposition: Follow-up withme or Dr. [name] in 3 months.   Thomasene Ripple. Bailyn Spackman NP-C    02/11/2020, 6:53 AM [facility] [address] Office [phone] Fax [phone]  Notice: This dictation was prepared with Dragon dictation along with smaller phrase technology. Any transcriptional errors that result from this process are unintentional and may not be corrected upon review.

## 2020-02-17 ENCOUNTER — Ambulatory Visit (INDEPENDENT_AMBULATORY_CARE_PROVIDER_SITE_OTHER): Payer: Self-pay | Admitting: General Practice

## 2020-02-17 ENCOUNTER — Encounter: Payer: Self-pay | Admitting: General Practice

## 2020-02-17 ENCOUNTER — Other Ambulatory Visit: Payer: Self-pay

## 2020-02-17 VITALS — BP 130/82 | HR 67 | Ht 71.0 in | Wt 177.2 lb

## 2020-02-17 DIAGNOSIS — R55 Syncope and collapse: Secondary | ICD-10-CM

## 2020-02-17 DIAGNOSIS — I10 Essential (primary) hypertension: Secondary | ICD-10-CM

## 2020-02-17 DIAGNOSIS — I429 Cardiomyopathy, unspecified: Secondary | ICD-10-CM

## 2020-02-17 DIAGNOSIS — I639 Cerebral infarction, unspecified: Secondary | ICD-10-CM

## 2020-02-17 MED ORDER — ENTRESTO 24-26 MG PO TABS: 1.0000 | ORAL_TABLET | Freq: Two times a day (BID) | ORAL | 3 refills | Status: DC

## 2020-02-17 MED ORDER — ATORVASTATIN CALCIUM 80 MG PO TABS: 80.0000 mg | ORAL_TABLET | Freq: Every day | ORAL | 3 refills | Status: DC

## 2020-02-17 NOTE — Patient Instructions (Signed)
Medication Instructions:  The current medical regimen is effective;  continue present plan and medications as directed. Please refer to the Current Medication list given to you today.  *If you need a refill on your cardiac medications before your next appointment, please call your pharmacy*  Lab Work: NONE  Testing/Procedures: Echocardiogram - Your physician has requested that you have an echocardiogram. Echocardiography is a painless test that uses sound waves to create images of your heart. It provides your doctor with information about the size and shape of your heart and how well your heart's chambers and valves are working. This procedure takes approximately one hour. There are no restrictions for this procedure. This will be performed at our [facility] location - [address].  Special Instructions MAKE SURE TO KEEP HYDRATED  YOU MAY RETURN TO WORK WITH NO RESTRICTIONS-LETTER PROVIDED  Follow-Up: Your next appointment:  3 month(s)   In Person with  -OR- JESSE CLEAVER, FNP-C  At [facility], you and your health needs are our priority.  As part of our continuing mission to provide you with exceptional heart care, we have created designated Provider Care Teams.  These Care Teams include your primary Cardiologist (physician) and Advanced Practice Providers (APPs -  Physician Assistants and Nurse Practitioners) who all work together to provide you with the care you need, when you need it.  We recommend signing up for the patient portal called "MyChart".  Sign up information is provided on this After Visit Summary.  MyChart is used to connect with patients for Virtual Visits (Telemedicine).  Patients are able to view lab/test results, encounter notes, upcoming appointments, etc.  Non-urgent messages can be sent to your provider as well.   To learn more about what you can do with MyChart, go to [email or web address].

## 2020-02-17 NOTE — Progress Notes (Signed)
Cardiology Clinic Note   Patient Name: David [name] Date of Encounter: 02/17/2020  Primary Care Provider:  Janeece Agee, NP Primary Cardiologist:  Olga Millers, MD  Patient Profile    David [name] 54 year old male presents today for follow-up evaluation of his chronic systolic CHF and cardiomyopathy.  Past Medical History    Past Medical History:  Diagnosis Date   Acute ischemic left MCA stroke (HCC)    a. 08/2019 MRI: Acute L MCA branch vessel infarctions; b. 08/2019 CTA head/neck: Patent bilat carotids/vertebrals. High grade stenosis - inf division of prox L MCA M2 branch. additional high-grade mid-dist superior L MCA M2 branch occlusion. 7mm aneurysm arising from cavernous L ICA.   Cardiomyopathy (HCC)    a. 08/2019 Echo: EF 20-25%, global HK. Gr2 DD. Nl RV fxn. RVSP 30.45mmHg.  Mildly dil LA. Mod MR.   Cerebral aneurysm    a. 08/2019 CTA Head: 7mm aneurysm arising from the cavernous L ICA.   CHF (congestive heart failure) (HCC)    CKD stage G2/A2, GFR 60-89 and albumin creatinine ratio 30-299 mg/g    Facial mass    a. CT head: 7.0 x 2.3 cm soft tissue mass along lat aspect of L temporal bone, involving L ext ear - highly suscpicious for malignancy.   Heart attack (HCC)    Heart murmur    Hypertension    Moderate mitral regurgitation    Nonischemic cardiomyopathy (HCC)    PFO (patent foramen ovale)    Stroke (HCC)    Tobacco abuse    Past Surgical History:  Procedure Laterality Date   BUBBLE STUDY  08/12/2019   Procedure: BUBBLE STUDY;  Surgeon: Parke Poisson, MD;  Location: [facility] ENDOSCOPY;  Service: Cardiology;;   EXTERNAL EAR SURGERY     TEE WITHOUT CARDIOVERSION N/A 08/12/2019   Procedure: TRANSESOPHAGEAL ECHOCARDIOGRAM (TEE);  Surgeon: Parke Poisson, MD;  Location: [facility] ENDOSCOPY;  Service: Cardiology;  Laterality: N/A;    Allergies  No Known Allergies  History of Present Illness    David [name] has a PMH of acute ischemic CVA,  chronic systolic CHF, HTN, chest pain, cardiomyopathy, marijuana use and tobacco abuse.   He was seen by cardiology on 08/11/2019 as a consult for new cardiomyopathy.  He had not seen a healthcare provider in many years.  He works 68 hours a week and had done this for around 20 years.  His work was somewhat physical and he performed tasks without physical limitations.  On 3/21 he noted chest discomfort that lasted for about 15 minutes and resolve spontaneously.  He mentioned this to his fiance but then did not seek medical attention for further evaluation.  On 08/09/2019 he returned home from work and was noted to have right facial droop and slurred speech.  He was taken by EMS to [facility] ED.  A CT of his head showed no hemorrhage however, subacute infarct in the mid left frontal lobe and left frontal operculum or identified.  He was mildly hypertensive at presentation.  He was also noted to have a 7 x 2.3 cm soft tissue mass along the lateral aspect of his left temporal bone.  This was highly suspicious for malignancy.  He was admitted and underwent CTA of his head and neck which showed high-grade stenosis in the inferior division of the proximal left MCA M2 branch and high-grade stenosis in the mid to distal posterior left MCA M2 branch.  A 7 mm aneurysm arising from his left ICA  was also noted.  An MRI showed acute left MCA branch vessel infarctions consistent with embolic stroke.   An echocardiogram 08/10/2019 showed an EF of 20-25% with global hypokinesis and grade 2 diastolic dysfunction.  Moderate mitral valve regurgitation.  He denied dyspnea on exertion, PND, orthopnea, and lower extremity edema.   During his last cardiology visit 08/14/2019 he denied chest pain and indicated he was feeling slightly better.  EKG showed normal sinus rhythm with PVCs and one run of nonsustained ventricular tachycardia that lasted 15 beats.  TEE 08/12/2019 showed EF 20-25%, global hypokinesis, mildly dilated left atria, and  tiny patent foramen ovale.  Cardiac MRI 08/13/2019 showed severely dilated left ventricle with mild concentric hypertrophy and severely decreased systolic function LVEF 20%.  Mild mitral and tricuspid regurgitation.  Findings suspicious for ischemic cardiomyopathy, ischemic work-up recommended.  Evidence of prethrombotic state there was no evidence of clot/thrombus however it was believed that thrombus may have been cause.  Patient was anticoagulated previously by neurology service.  With a plan to transition losartan to [facility] as outpatient.  He was given LifeVest at discharge with a plan for possible ICD in 3 months.   He presented to the clinic 11/05/19 for follow-up evaluation and stateed he did not feel any different than he did prior to going to the hospital.  He had been wearing his LifeVest as directed.  He did not have any increased shortness of breath and denies chest pain.  I  stopped his losartan  and started him on Entresto.  I ordered BMP checked in 1 week and scheduled follow-up in 3 to 4 weeks for further evaluation.   He was seen in follow-up evaluation by Dr. Ladona Ridgel on 11/12/2019.  During that time he was doing well.  His Eliquis was stopped and he was started on 81 mg aspirin.  His follow-up echocardiogram 11/06/2019 showed an LVEF improvement to 40-45%.  He was instructed to follow-up in 1 year.   He presented to the emergency department on 11/21/2019 with complaints of coughing up/vomiting blood.  His nausea was controlled with Zofran.  CT abdomen showed no acute findings.  It was felt that his symptoms were related to gastroenteritis.  He denied dysuria or hematuria.  He was discharged in stable condition.  His creatinine at the time was 1.86.   He presented to the clinic 11/27/19 and stated his Eliquis was stopped by Dr. Ladona Ridgel and he is now taking aspirin.  He had an acute episode of hematemesis and presented to the emergency department.  He has had no more episodes of GI bleeding.  He was  back at work and doing 40 hours/week.  He stated he had some back pain  which appears to be muscular in nature.  He felt overall he had more energy and was doing well.  I did not increase his Entresto  due to his recent bump in creatinine surrounding his acute episode of GI bleeding.  I planned follow-up with him virtually in 1 month, and order a BMP  for further evaluation.   He was recently seen in the emergency department for vasovagal symptoms on 12/02/2019.  He was discharged on 12/04/2019.  He was noted to have 1 syncopal episode.  He was standing and moving boxes with his upper extremities.  Boxes were noted to be medium weight.  He lost consciousness and did not recall any prodromal symptoms.  EMS was called and he was found to be diaphoretic and hypotensive with a blood pressure  in 34s.  He received IV hydration.  EKG showed no arrhythmias.  Recent CTA showed no coronary disease.  His metoprolol was continued at 12.5 and his Entresto and carvedilol were held.  His renal function improved with hydration.  He was instructed to have outpatient renal ultrasound due to a renal lesion.  He suffered a rib fracture involving the left 10th 11th and 12th ribs.   He presented to the clinic 12/12/19 for follow-up evaluation and stated he felt well.  He  had no further episodes of syncope.  He stated that his ribs continued to hurt.  He presented with his wife and his wife stated that his place employment was not allowing him to claim Workmen's Comp.  Due to his blood pressure  I didnot make any medication changes.  I had him continue to increase his physical activity slowly.  I educated about bracing ribs for pain relief with coughing sneezing, and movements.  I planned  follow-up in 1 month for reevaluation.   He presented to the clinic 12/26/2019 for follow-up evaluation and stated he had slowly started to increase his physical activity.  He had been to see his PCP who prescribed tramadol for his rib pain.  He  continued to try to work with his place of employment to make this case Workmen's Comp.  Him and his wife had contacted a lawyer to help him with this case.  I  stopped his metoprolol and restarted his Entresto.  We planned to have him follow-up in 4 weeks for reevaluation and possible up titration.  I had him continue to increase his physical activity slowly as tolerated.  We gave patient assistance contact information for help with his medication cost.   He presented to the clinic 8/202021 for follow-up evaluation and stated he was feeling much better with adding Entresto back to his medication regimen.  He had not been approved yet for patient assistance and had no insurance.  We  gave Entresto samples. He did state however that he  had episodes of dizziness upon standing.  He wished to return to work so that he may regain his insurance.  His blood pressures had been stable at home.  I  asked him to return to clinic for follow-up in 2 weeks to reevaluate his dizziness and repeat his echocardiogram in 1 month.  I  Repeated his BMP.  He presents to the clinic today for follow-up evaluation and states he feels well.  He has had no further episodes of lightheadedness or dizziness.  He states he would like to return to work and needs a note indicating that he does not have any restrictions.  His blood pressure has remained stable.  I will order an echocardiogram, refill his Entresto, atorvastatin, and have him follow-up with Dr. [name] in 3 months.   Today he denies chest pain, shortness of breath, lower extremity edema, fatigue, palpitations, melena, hematuria, hemoptysis, diaphoresis, weakness, presyncope, syncope, orthopnea, and PND.  Home Medications    Prior to Admission medications   Medication Sig Start Date End Date Taking? Authorizing Provider  Acetaminophen (TYLENOL PO) Take by mouth.    [provider]  aspirin EC 81 MG tablet Take 1 tablet (81 mg total) by mouth daily. 11/12/19    Marinus Maw, MD  atorvastatin (LIPITOR) 80 MG tablet Take 1 tablet (80 mg total) by mouth daily at 6 PM. 01/14/20   Micki Riley, MD  nicotine (NICODERM CQ) 21 mg/24hr patch Place 1 patch (  21 mg total) onto the skin daily. 09/12/19   Janeece Agee, NP  pantoprazole (PROTONIX) 40 MG tablet Take 1 tablet (40 mg total) by mouth daily. 01/06/20   Lewayne Bunting, MD  sacubitril-valsartan (ENTRESTO) 24-26 MG Take 1 tablet by mouth 2 (two) times daily. 12/26/19   Ronney Asters, NP    Family History    Family History  Problem Relation Age of Onset   Stroke Sister    Cancer Mother        died @ 21   Other Father        died of CO poisoning as a young man.   He indicated that his mother is deceased. He indicated that his father is deceased. He indicated that his sister is alive.  Social History    Social History   Socioeconomic History   Marital status: Single    Spouse name: Not on file   Number of children: 6   Years of education: Not on file   Highest education level: Not on file  Occupational History   Not on file  Tobacco Use   Smoking status: Current Every Day Smoker    Packs/day: 0.25    Years: 35.00    Pack years: 8.75    Types: Cigarettes   Smokeless tobacco: Never Used  Building services engineer Use: Never used  Substance and Sexual Activity   Alcohol use: No   Drug use: Not Currently    Types: Marijuana    Comment: smokes several blunts on the weekend.   Sexual activity: Yes  Other Topics Concern   Not on file  Social History Narrative   ** Merged History Encounter **       Lives in [place] w/ fiancee.  Does not routinely exercise but has very busy work-life, working M-F 12 hrs shifts and another 8 hrs on Saturdays.  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   Social Determinants of Health   Financial Resource Strain:    Difficulty of Paying Living Expenses: Not on file  Food Insecurity:    Worried About Programme researcher, broadcasting/film/video  in the Last Year: Not on file   [employer] of Food in the Last Year: Not on file  Transportation Needs:    Lack of Transportation (Medical): Not on file   Lack of Transportation (Non-Medical): Not on file  Physical Activity:    Days of Exercise per Week: Not on file   Minutes of Exercise per Session: Not on file  Stress:    Feeling of Stress : Not on file  Social Connections:    Frequency of Communication with Friends and Family: Not on file   Frequency of Social Gatherings with Friends and Family: Not on file   Attends Religious Services: Not on file   Active Member of Clubs or Organizations: Not on file   Attends Banker Meetings: Not on file   Marital Status: Not on file  Intimate Partner Violence:    Fear of Current or Ex-Partner: Not on file   Emotionally Abused: Not on file   Physically Abused: Not on file   Sexually Abused: Not on file     Review of Systems    General:  No chills, fever, night sweats or weight changes.  Cardiovascular:  No chest pain, dyspnea on exertion, edema, orthopnea, palpitations, paroxysmal nocturnal dyspnea. Dermatological: No rash, lesions/masses Respiratory: No cough, dyspnea Urologic: No hematuria, dysuria Abdominal:   No  nausea, vomiting, diarrhea, bright red blood per rectum, melena, or hematemesis Neurologic:  No visual changes, wkns, changes in mental status. All other systems reviewed and are otherwise negative except as noted above.  Physical Exam    VS:  BP 130/82    Pulse 67    Ht 5\' 11"  (1.803 m)    Wt 177 lb 3.2 oz (80.4 kg)    SpO2 97%    BMI 24.71 kg/m  , BMI Body mass index is 24.71 kg/m. GEN: Well nourished, well developed, in no acute distress. HEENT: normal. Neck: Supple, no JVD, carotid bruits, or masses. Cardiac: RRR, no murmurs, rubs, or gallops. No clubbing, cyanosis, edema.  Radials/DP/PT 2+ and equal bilaterally.  Respiratory:  Respirations regular and unlabored, clear to auscultation  bilaterally. GI: Soft, nontender, nondistended, BS + x 4. MS: no deformity or atrophy. Skin: warm and dry, no rash. Neuro:  Strength and sensation are intact. Psych: Normal affect.  Accessory Clinical Findings    Recent Labs: 08/14/2019: Magnesium 2.1 08/21/2019: TSH 1.840 12/16/2019: ALT 143; Hemoglobin 12.7; Platelets 231 01/24/2020: BUN 13; Creatinine, Ser 1.24; Potassium 5.1; Sodium 146   Recent Lipid Panel    Component Value Date/Time   CHOL 93 (L) 08/21/2019 0908   TRIG 57 08/21/2019 0908   HDL 29 (L) 08/21/2019 0908   CHOLHDL 3.2 08/21/2019 0908   CHOLHDL 4.4 08/10/2019 0439   VLDL 18 08/10/2019 0439   LDLCALC 51 08/21/2019 0908    ECG personally reviewed by me today-none today.  EKG 11/27/2019  normal sinus rhythm 75 bpm T wave abnormality consider inferior ischemia no acute changes   EKG 08/09/2019 Sinus rhythm 80 bpm   Cardiac MRI 08/13/2019 IMPRESSION: 1. Severely dilated left ventricle with mild concentric hypertrophy and severely decreased systolic function (LVEF = 20%). There is severe diffuse hypokinesis with akinesis in the basal, mid and apical inferior wall. There is transmural late gadolinium enhancement in the basal, mid and apical inferior walls. There is a heavy smoke/sludge in the left ventricular apex consistent with pre-thrombotic state. Systemic anticoagulation is recommended.   2. Normal right ventricular size, thickness and mildly decreased systolic function (LVEF = 39%). There are no regional wall motion abnormalities.   3. Normal left and right atrial size.   4. Normal size of the aortic root, ascending aorta and pulmonary artery.   5. Mild mitral and tricuspid regurgitation.   6. Normal pericardium.  No pericardial effusion.   These findings are suspicious for an ischemic cardiomyopathy, an ischemic workup is recommended.   Echocardiogram 09/04/2019 IMPRESSIONS     1. No LV thombus is seen. Left ventricular ejection fraction, by    estimation, is 20 to 25%. The left ventricle has severely decreased  function. The left ventricle demonstrates global hypokinesis. The left  ventricular internal cavity size was severely  dilated. Left ventricular diastolic parameters are consistent with Grade  II diastolic dysfunction (pseudonormalization). Elevated left atrial  pressure.   2. Right ventricular systolic function is normal. The right ventricular  size is normal. There is normal pulmonary artery systolic pressure. The  estimated right ventricular systolic pressure is 25.3 mmHg.   3. Left atrial size was mildly dilated.   4. The mitral valve is normal in structure. Mild to moderate mitral valve  regurgitation.   5. The aortic valve is normal in structure. Aortic valve regurgitation is  not visualized.   6. The inferior vena cava is normal in size with greater than 50%  respiratory variability, suggesting  right atrial pressure of 3 mmHg.   Comparison(s): Prior images reviewed side by side. Changes from prior  study are noted. Although LV systolic function is unchanged, there is less  mitral regurgitation and the signs of high left atrial pressure are  improved.   ECHO TEE 08/12/2019   1. Left ventricular ejection fraction, by estimation, is 20 to 25%. The  left ventricle has severely decreased function. The left ventricle  demonstrates global hypokinesis. The left ventricular internal cavity size  was moderately dilated.   2. Right ventricular systolic function is normal. The right ventricular  size is normal.   3. Left atrial size was mildly dilated. No left atrial/left atrial  appendage thrombus was detected. The LAA emptying velocity was 105 cm/s.   4. The mitral valve is grossly normal. Mild mitral valve regurgitation.   5. The aortic valve is tricuspid. Aortic valve regurgitation is trivial.   6. There is borderline dilatation of the ascending aorta measuring 38 mm.  There is mild (Grade II) atheroma plaque  involving the transverse and  descending aorta.   7. Evidence of atrial level shunting detected by color flow Doppler.  Agitated saline contrast bubble study was positive with shunting observed  within 3-6 cardiac cycles suggestive of interatrial shunt. There is a tiny  patent foramen ovale.      Echocardiogram 11/06/2019 IMPRESSIONS     1. Left ventricular ejection fraction, by estimation, is 40 to 45%. The  left ventricle has mildly decreased function. The left ventricle  demonstrates global hypokinesis. There is severe hypokinesis of the left  ventricular, entire anteroseptal wall and  inferoseptal wall.The left ventricular internal cavity size was severely  dilated. Left ventricular diastolic parameters are consistent with Grade I  diastolic dysfunction (impaired relaxation). Elevated left ventricular  end-diastolic pressure. The LV  strain was performed but visually does not appear accurate so is not  reported.   2. Right ventricular systolic function is normal. The right ventricular  size is normal. Tricuspid regurgitation signal is inadequate for assessing  PA pressure.   3. The mitral valve is normal in structure. Mild mitral valve  regurgitation. No evidence of mitral stenosis.   4. The aortic valve is tricuspid. Aortic valve regurgitation is not  visualized. Mild aortic valve sclerosis is present, with no evidence of  aortic valve stenosis.   5. Aortic dilatation noted. There is mild dilatation of the ascending  aorta measuring 39 mm.   6. The inferior vena cava is normal in size with greater than 50%  respiratory variability, suggesting right atrial pressure of 3 mmHg.   7. Compared to prior echo 09/04/2019, LVF has improved.    Assessment & Plan   1.  Cardiomyopathy-  No increased dyspnea or activity intolerance .  Echocardiogram 09/04/2019 stable compared to previous LVEF 20-25%, G2 DD, trivial aortic valve regurgitation, left atrium mildly dilated.  Echocardiogram  11/06/2019 showed an LVEF improvement to 40-45%. Blood pressure today 130/82 BMP stable Continue Entresto 24/26   Heart healthy low-sodium diet-salty 6 given Continue to increase physical activity as tolerated Ischemic evaluation plan for after CVA recovery. Order echocardiogram We will provide return to work note.   Syncope-no further/recent syncopal episodes.  Continues walking daily.  Was seen in the emergency department for vasovagal symptoms on 12/02/2019.  He was discharged on 12/04/2019.   He was standing and moving boxes with his upper extremities at his place of employment.. Maintain p.o. hydration   Essential hypertension-BP today 130/82.  Well-controlled at home. Blood pressure cuff given at last appointment Continue Metoprolol Heart healthy low-sodium diet-salty 6 given Increase physical activity as tolerated Blood pressure log   L MCA CVA-TEE showed PFO, no clot/thrombus.  EP consulted for loop recorder placement.  He was not a candidate for loop given his low EF and possible need for ICD.     Disposition: Follow-up with me or Dr. [name] in 3 months.   Thomasene Ripple. Culver Feighner NP-C    02/17/2020, 11:33 AM [facility] [address] Office [phone] Fax [phone]  Notice: This dictation was prepared with Dragon dictation along with smaller phrase technology. Any transcriptional errors that result from this process are unintentional and may not be corrected upon review.

## 2020-02-24 ENCOUNTER — Telehealth: Payer: Self-pay | Admitting: Cardiology

## 2020-02-24 NOTE — Telephone Encounter (Signed)
Medication samples have been provided to the patient.  Drug name: entresto 24-26mg   Qty: 28 tablets  LOT: [id number]  Exp.Date: 10/2021  Samples left at front desk for patient pick-up. Patient notified.  Julaine Fusi M 9:00 AM 02/24/2020   Patient states he has not yet applied for patient assistance for [facility] and has no drug coverage. Per chart review, Amy Lee care guide has been trying to get in touch with patient about this. Message routed to her to contact patient.

## 2020-02-24 NOTE — Telephone Encounter (Signed)
  Patient calling the office for samples of medication:   1.  What medication and dosage are you requesting samples for? sacubitril-valsartan (ENTRESTO) 24-26 MG  2.  Are you currently out of this medication? No  Patient said he tried to refill the medication at [facility] but he does not have insurance and it would cost him $600

## 2020-02-24 NOTE — Telephone Encounter (Signed)
Hi [name],  I will reach out to the patient to discuss the application process for the [facility] Patient Assistance Program.  Thanks, Latyra Jaye

## 2020-02-25 ENCOUNTER — Other Ambulatory Visit (HOSPITAL_COMMUNITY): Payer: Self-pay

## 2020-02-28 ENCOUNTER — Telehealth: Payer: Self-pay | Admitting: General Practice

## 2020-02-28 NOTE — Telephone Encounter (Signed)
Forms completed by Scheryl Marten and faxed by nurse . Mailed copy of completed forms to patient.       02/28/20 fsw

## 2020-03-03 ENCOUNTER — Other Ambulatory Visit: Payer: Self-pay

## 2020-03-03 ENCOUNTER — Ambulatory Visit (HOSPITAL_COMMUNITY): Payer: Self-pay | Attending: Cardiology

## 2020-03-03 ENCOUNTER — Encounter (HOSPITAL_COMMUNITY): Payer: Self-pay

## 2020-03-03 ENCOUNTER — Other Ambulatory Visit (HOSPITAL_COMMUNITY): Payer: Self-pay

## 2020-03-03 DIAGNOSIS — I639 Cerebral infarction, unspecified: Secondary | ICD-10-CM | POA: Insufficient documentation

## 2020-03-03 DIAGNOSIS — I10 Essential (primary) hypertension: Secondary | ICD-10-CM | POA: Insufficient documentation

## 2020-03-03 DIAGNOSIS — R55 Syncope and collapse: Secondary | ICD-10-CM | POA: Insufficient documentation

## 2020-03-03 DIAGNOSIS — I429 Cardiomyopathy, unspecified: Secondary | ICD-10-CM | POA: Insufficient documentation

## 2020-03-03 LAB — ECHOCARDIOGRAM COMPLETE
Area-P 1/2: 3.53 cm2
S' Lateral: 5.7 cm

## 2020-03-18 ENCOUNTER — Telehealth: Payer: Self-pay

## 2020-03-18 DIAGNOSIS — Z0000 Encounter for general adult medical examination without abnormal findings: Secondary | ICD-10-CM

## 2020-03-18 NOTE — Telephone Encounter (Signed)
Called patient to inform him that his signature is needed on the patient assistance application for Entresto. Left patient a message to return call to Care Guide at [phone].

## 2020-03-19 ENCOUNTER — Telehealth: Payer: Self-pay | Admitting: Cardiology

## 2020-03-19 NOTE — Telephone Encounter (Signed)
   *  STAT* If patient is at the pharmacy, call can be transferred to refill team.   1. Which medications need to be refilled? (please list name of each medication and dose if known)  atorvastatin (LIPITOR) 80 MG tablet    pantoprazole (PROTONIX) 40 MG tablet    2. Which pharmacy/location (including street and city if local pharmacy) is medication to be sent to? [facility] - [place], [place] - [address]  3. Do they need a 30 day or 90 day supply? 30 days

## 2020-03-23 ENCOUNTER — Telehealth: Payer: Self-pay

## 2020-03-23 DIAGNOSIS — Z0000 Encounter for general adult medical examination without abnormal findings: Secondary | ICD-10-CM

## 2020-03-23 NOTE — Telephone Encounter (Signed)
Called patient again to inform him that the [facility] Patient Assistance Application is ready for his signature. Left patient a message to call Care Guide at [phone].

## 2020-03-24 ENCOUNTER — Other Ambulatory Visit: Payer: Self-pay

## 2020-03-24 DIAGNOSIS — R55 Syncope and collapse: Secondary | ICD-10-CM

## 2020-03-24 DIAGNOSIS — I639 Cerebral infarction, unspecified: Secondary | ICD-10-CM

## 2020-03-24 DIAGNOSIS — I429 Cardiomyopathy, unspecified: Secondary | ICD-10-CM

## 2020-03-24 DIAGNOSIS — I10 Essential (primary) hypertension: Secondary | ICD-10-CM

## 2020-03-24 MED ORDER — CARVEDILOL 3.125 MG PO TABS
3.1250 mg | ORAL_TABLET | Freq: Two times a day (BID) | ORAL | 3 refills | Status: DC
Start: 2020-03-24 — End: 2020-07-20

## 2020-05-19 ENCOUNTER — Ambulatory Visit: Payer: Self-pay | Admitting: General Practice

## 2020-06-09 NOTE — Progress Notes (Deleted)
Cardiology Clinic Note   Patient Name: David [name] Date of Encounter: 06/09/2020  Primary Care Provider:  Janeece Agee, NP Primary Cardiologist:  Olga Millers, MD  Patient Profile    David Roan Spinks73 year old male presents today for follow-up evaluation of his chronic systolic CHF and cardiomyopathy.  Past Medical History    Past Medical History:  Diagnosis Date  . Acute ischemic left MCA stroke (HCC)    a. 08/2019 MRI: Acute L MCA branch vessel infarctions; b. 08/2019 CTA head/neck: Patent bilat carotids/vertebrals. High grade stenosis - inf division of prox L MCA M2 branch. additional high-grade mid-dist superior L MCA M2 branch occlusion. 48mm aneurysm arising from cavernous L ICA.  [name] Cardiomyopathy (HCC)    a. 08/2019 Echo: EF 20-25%, global HK. Gr2 DD. Nl RV fxn. RVSP 30.11mmHg.  Mildly dil LA. Mod MR.  . Cerebral aneurysm    a. 08/2019 CTA Head: 47mm aneurysm arising from the cavernous L ICA.  [name] CHF (congestive heart failure) (HCC)   . CKD stage G2/A2, GFR 60-89 and albumin creatinine ratio 30-299 mg/g   . Facial mass    a. CT head: 7.0 x 2.3 cm soft tissue mass along lat aspect of L temporal bone, involving L ext ear - highly suscpicious for malignancy.  [name] Heart attack (HCC)   . Heart murmur   . Hypertension   . Moderate mitral regurgitation   . Nonischemic cardiomyopathy (HCC)   . PFO (patent foramen ovale)   . Stroke (HCC)   . Tobacco abuse    Past Surgical History:  Procedure Laterality Date  . BUBBLE STUDY  08/12/2019   Procedure: BUBBLE STUDY;  Surgeon: Parke Poisson, MD;  Location: [facility] ENDOSCOPY;  Service: Cardiology;;  . EXTERNAL EAR SURGERY    . TEE WITHOUT CARDIOVERSION N/A 08/12/2019   Procedure: TRANSESOPHAGEAL ECHOCARDIOGRAM (TEE);  Surgeon: Parke Poisson, MD;  Location: [facility] ENDOSCOPY;  Service: Cardiology;  Laterality: N/A;    Allergies  No Known Allergies  History of Present Illness    David [name] has a PMH of acute ischemic CVA,  chronic systolic CHF, HTN, chest pain, cardiomyopathy, marijuana use and tobacco abuse.  He was seen by cardiology on 08/11/2019 as a consult for new cardiomyopathy. He had not seen a healthcare provider in many years. He works 68 hours a week and had done this for around 20 years. His work was somewhat physical and he performed tasks without physical limitations. On 3/21 he noted chest discomfort that lasted for about 15 minutes and resolve spontaneously. He mentioned this to his fiance but then did not seek medical attention for further evaluation. On 08/09/2019 he returned home from work and was noted to have right facial droop and slurred speech. He was taken by EMS to [facility], ED. A CT of his head showed no hemorrhage however, subacute infarct in the mid left frontal lobe and left frontal operculum or identified. He was mildly hypertensive at presentation. He was also noted to have a 7 x 2.3 cm soft tissue mass along the lateral aspect of his left temporal bone. This was highly suspicious for malignancy. He was admitted and underwent CTA of his head and neck which showed high-grade stenosis in the inferior division of the proximal left MCA M2 branch and high-grade stenosis in the mid to distal posterior left MCA M2 branch. A 7 mm aneurysm arising from his left ICA was also noted. An MRI showed acute left MCA branch vessel infarctions consistent with embolic  stroke.  An echocardiogram 08/10/2019 showed an EF of 20-25% with global hypokinesis and grade 2 diastolic dysfunction. Moderate mitral valve regurgitation. He denied dyspnea on exertion, PND, orthopnea, and lower extremity edema.  During his lastcardiology visit3/03/2020 he denied chest pain and indicated he was feeling slightly better. EKG showed normal sinus rhythm with PVCs and one run of nonsustained ventricular tachycardia that lasted 15 beats. TEE 08/12/2019 showed EF 20-25%, global hypokinesis, mildly dilated left atria, and  tiny patent foramen ovale. Cardiac MRI 08/13/2019 showed severely dilated left ventricle with mild concentric hypertrophy and severely decreased systolic function LVEF 20%. Mild mitral and tricuspid regurgitation. Findings suspicious for ischemic cardiomyopathy, ischemic work-up recommended. Evidence of prethrombotic state there was no evidence of clot/thrombus however it was believed that thrombus may have been cause. Patient was anticoagulated previously by neurology service. With a plan to transition losartan to [facility] as outpatient. He was given LifeVest at discharge with a plan for possible ICD in 3 months.  He presented tothe clinic 6/1/21for follow-up evaluation and stateedhe didnot feel any different than he did prior to going to the hospital. He hadbeen wearing his LifeVest as directed. He didnot have any increased shortness of breath and denies chest pain. I stoppedhis losartan and startedhim on Entresto. IorderedBMP checked in 1 week and scheduledfollow-up in 3 to 4 weeks for further evaluation.  He was seen in follow-up evaluation by Dr. Ladona Ridgel on 11/12/2019. During that time he was doing well. His Eliquis was stopped and he was started on 81 mg aspirin. His follow-up echocardiogram 11/06/2019 showed an LVEF improvement to 40-45%. He was instructed to follow-up in 1 year.  He presented to the emergency department on 11/21/2019 with complaints of coughing up/vomiting blood. His nausea was controlled with Zofran. CT abdomen showed no acute findings. It was felt that his symptoms were related to gastroenteritis. He denied dysuria or hematuria. He was discharged in stable condition. His creatinine at the time was 1.86.  He presentedto the clinic 6/23/21and statedhis Eliquis was stopped by Dr. Ladona Ridgel and he is now taking aspirin. He had an acute episode of hematemesis and presented to the emergency department. He has had no more episodes of GI bleeding.  Hewasback at work and doing 40 hours/week. He statedhe hadsome back pain which appears to be muscular in nature. He feltoverall he hadmore energy and wasdoing well. Ididnot increase his Sherryll Burger due to his recent bump in creatinine surrounding his acute episode of GI bleeding. Iplannedfollow-up with him virtually in 1 month, and order a BMP for further evaluation.  He was recently seen in the emergency department for vasovagal symptoms on 12/02/2019. He was discharged on 12/04/2019. He was noted to have 1 syncopal episode. He was standing and moving boxes with his upper extremities. Boxes were noted to be medium weight. He lost consciousness and did not recall any prodromal symptoms. EMS was called and he was found to be diaphoretic and hypotensive with a blood pressure in 70s. He received IV hydration. EKG showed no arrhythmias. Recent CTA showed no coronary disease. His metoprolol was continued at 12.5 and his Entresto and carvedilol were held. His renal function improved with hydration. He was instructed to have outpatient renal ultrasound due to a renal lesion. He suffered a rib fracture involving the left 10th 11th and 12th ribs.  He presentedto the clinic 7/8/21for follow-up evaluation and statedhe feltwell. He had no further episodes of syncope. He statedthat his ribs continuedto hurt. He presentedwith his wife and his wife statedthat  his place employmentwasnot allowing him to claimWorkmen's Comp. Due to his blood pressure I didnot make any medication changes. I hadhim continue to increase his physical activity slowly. I educated about bracing ribs for pain relief with coughing sneezing, and movements. Iplannedfollow-up in 1 month for reevaluation.  He presentedto the clinic7/22/2021for follow-up evaluation and statedhe hadslowly started to increase his physical activity. He hadbeen to see his PCP who prescribed tramadol for his rib pain.  He continuedto try to work with his place of employment to make this case Workmen's Comp. Him and his wife hadcontacted a lawyer to help him with this case. I stoppedhis metoprolol and restartedhis Entresto. Weplanned tohave him follow-up in 4 weeks for reevaluation and possible up titration. Ihadhim continue to increase his physical activity slowly as tolerated. Wegavepatient assistance contact informationfor help with his medication cost.  He presented to the clinic 8/202021 for follow-up evaluation and statedhe was feeling much better with adding Entresto back to his medication regimen. He had not been approved yet for patient assistance and had no insurance. We  gave Entresto samples. He did state however that he  had episodes of dizziness upon standing. He wished to return to work so that he may regain his insurance. His blood pressures had been stable at home. I  asked him to return to clinic for follow-up in 2 weeks to reevaluate his dizziness and repeat his echocardiogram in 1 month. I  Repeated his BMP.  He presented to the clinic 02/17/20 for follow-up evaluation and stated he felt well.  He had no further episodes of lightheadedness or dizziness.  He stated he would like to return to work and needed a note indicating that he did not have any restrictions.  His blood pressure had remained stable.  I will ordered an echocardiogram, refilled his Entresto, atorvastatin, and had him follow-up with Dr. Stanford Breed in 3 months.  He presents to the clinic today for follow-up evaluation states***  Today hedenies chest pain, shortness of breath, lower extremity edema, fatigue, palpitations, melena, hematuria, hemoptysis, diaphoresis, weakness, presyncope, syncope, orthopnea, and PND.  Home Medications    Prior to Admission medications   Medication Sig Start Date End Date Taking? Authorizing Provider  Acetaminophen (TYLENOL PO) Take by mouth.    [provider]   aspirin EC 81 MG tablet Take 1 tablet (81 mg total) by mouth daily. 11/12/19   Evans Lance, MD  atorvastatin (LIPITOR) 80 MG tablet Take 1 tablet (80 mg total) by mouth daily at 6 PM. 02/17/20   Malania Gawthrop, Jossie Ng, NP  carvedilol (COREG) 3.125 MG tablet Take 1 tablet (3.125 mg total) by mouth 2 (two) times daily. 03/24/20 06/22/20  Deberah Pelton, NP  nicotine (NICODERM CQ) 21 mg/24hr patch Place 1 patch (21 mg total) onto the skin daily. 09/12/19   Maximiano Coss, NP  pantoprazole (PROTONIX) 40 MG tablet Take 1 tablet (40 mg total) by mouth daily. 01/06/20   Lelon Perla, MD  sacubitril-valsartan (ENTRESTO) 24-26 MG Take 1 tablet by mouth 2 (two) times daily. 02/17/20   Deberah Pelton, NP    Family History    Family History  Problem Relation Age of Onset  . Stroke Sister   . Cancer Mother        died @ 44  . Other Father        died of CO poisoning as a young man.   He indicated that his mother is deceased. He indicated that his father is deceased.  He indicated that his sister is alive.  Social History    Social History   Socioeconomic History  . Marital status: Single    Spouse name: Not on file  . Number of children: 6  . Years of education: Not on file  . Highest education level: Not on file  Occupational History  . Not on file  Tobacco Use  . Smoking status: Current Every Day Smoker    Packs/day: 0.25    Years: 35.00    Pack years: 8.75    Types: Cigarettes  . Smokeless tobacco: Never Used  Vaping Use  . Vaping Use: Never used  Substance and Sexual Activity  . Alcohol use: No  . Drug use: Not Currently    Types: Marijuana    Comment: smokes several blunts on the weekend.  . Sexual activity: Yes  Other Topics Concern  . Not on file  Social History Narrative   ** Merged History Encounter **       Lives in [place] w/ fiancee.  Does not routinely exercise but has very busy work-life, working M-F 12 hrs shifts and another 8 hrs on Saturdays.  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   Social Determinants of Health   Financial Resource Strain: Not on file  Food Insecurity: Not on file  Transportation Needs: Not on file  Physical Activity: Not on file  Stress: Not on file  Social Connections: Not on file  Intimate Partner Violence: Not on file     Review of Systems    General:  No chills, fever, night sweats or weight changes.  Cardiovascular:  No chest pain, dyspnea on exertion, edema, orthopnea, palpitations, paroxysmal nocturnal dyspnea. Dermatological: No rash, lesions/masses Respiratory: No cough, dyspnea Urologic: No hematuria, dysuria Abdominal:   No nausea, vomiting, diarrhea, bright red blood per rectum, melena, or hematemesis Neurologic:  No visual changes, wkns, changes in mental status. All other systems reviewed and are otherwise negative except as noted above.  Physical Exam    VS:  There were no vitals taken for this visit. , BMI There is no height or weight on file to calculate BMI. GEN: Well nourished, well developed, in no acute distress. HEENT: normal. Neck: Supple, no JVD, carotid bruits, or masses. Cardiac: RRR, no murmurs, rubs, or gallops. No clubbing, cyanosis, edema.  Radials/DP/PT 2+ and equal bilaterally.  Respiratory:  Respirations regular and unlabored, clear to auscultation bilaterally. GI: Soft, nontender, nondistended, BS + x 4. MS: no deformity or atrophy. Skin: warm and dry, no rash. Neuro:  Strength and sensation are intact. Psych: Normal affect.  Accessory Clinical Findings    Recent Labs: 08/14/2019: Magnesium 2.1 08/21/2019: TSH 1.840 12/16/2019: ALT 143; Hemoglobin 12.7; Platelets 231 01/24/2020: BUN 13; Creatinine, Ser 1.24; Potassium 5.1; Sodium 146   Recent Lipid Panel    Component Value Date/Time   CHOL 93 (L) 08/21/2019 0908   TRIG 57 08/21/2019 0908   HDL 29 (L) 08/21/2019 0908   CHOLHDL 3.2 08/21/2019 0908   CHOLHDL 4.4 08/10/2019 0439   VLDL 18 08/10/2019 0439    LDLCALC 51 08/21/2019 0908    ECG personally reviewed by me today- *** - No acute changes  EKG 11/27/2019 normal sinus rhythm 75 bpm T wave abnormality consider inferior ischemia no acute changes  EKG 08/09/2019 Sinus rhythm 80 bpm  Cardiac MRI 08/13/2019 IMPRESSION: 1. Severely dilated left ventricle with mild concentric hypertrophy and severely decreased systolic function (LVEF = 20%). There is severe  diffuse hypokinesis with akinesis in the basal, mid and apical inferior wall. There is transmural late gadolinium enhancement in the basal, mid and apical inferior walls. There is a heavy smoke/sludge in the left ventricular apex consistent with pre-thrombotic state. Systemic anticoagulation is recommended.  2. Normal right ventricular size, thickness and mildly decreased systolic function (LVEF = 39%). There are no regional wall motion abnormalities.  3. Normal left and right atrial size.  4. Normal size of the aortic root, ascending aorta and pulmonary artery.  5. Mild mitral and tricuspid regurgitation.  6. Normal pericardium. No pericardial effusion.  These findings are suspicious for an ischemic cardiomyopathy, an ischemic workup is recommended.  Echocardiogram 09/04/2019 IMPRESSIONS    1. No LV thombus is seen. Left ventricular ejection fraction, by  estimation, is 20 to 25%. The left ventricle has severely decreased  function. The left ventricle demonstrates global hypokinesis. The left  ventricular internal cavity size was severely  dilated. Left ventricular diastolic parameters are consistent with Grade  II diastolic dysfunction (pseudonormalization). Elevated left atrial  pressure.  2. Right ventricular systolic function is normal. The right ventricular  size is normal. There is normal pulmonary artery systolic pressure. The  estimated right ventricular systolic pressure is 25.3 mmHg.  3. Left atrial size was mildly dilated.  4. The mitral  valve is normal in structure. Mild to moderate mitral valve  regurgitation.  5. The aortic valve is normal in structure. Aortic valve regurgitation is  not visualized.  6. The inferior vena cava is normal in size with greater than 50%  respiratory variability, suggesting right atrial pressure of 3 mmHg.   Comparison(s): Prior images reviewed side by side. Changes from prior  study are noted. Although LV systolic function is unchanged, there is less  mitral regurgitation and the signs of high left atrial pressure are  improved.  ECHO TEE 08/12/2019  1. Left ventricular ejection fraction, by estimation, is 20 to 25%. The  left ventricle has severely decreased function. The left ventricle  demonstrates global hypokinesis. The left ventricular internal cavity size  was moderately dilated.  2. Right ventricular systolic function is normal. The right ventricular  size is normal.  3. Left atrial size was mildly dilated. No left atrial/left atrial  appendage thrombus was detected. The LAA emptying velocity was 105 cm/s.  4. The mitral valve is grossly normal. Mild mitral valve regurgitation.  5. The aortic valve is tricuspid. Aortic valve regurgitation is trivial.  6. There is borderline dilatation of the ascending aorta measuring 38 mm.  There is mild (Grade II) atheroma plaque involving the transverse and  descending aorta.  7. Evidence of atrial level shunting detected by color flow Doppler.  Agitated saline contrast bubble study was positive with shunting observed  within 3-6 cardiac cycles suggestive of interatrial shunt. There is a tiny  patent foramen ovale.    Echocardiogram 11/06/2019 IMPRESSIONS    1. Left ventricular ejection fraction, by estimation, is 40 to 45%. The  left ventricle has mildly decreased function. The left ventricle  demonstrates global hypokinesis. There is severe hypokinesis of the left  ventricular, entire anteroseptal wall and  inferoseptal  wall.The left ventricular internal cavity size was severely  dilated. Left ventricular diastolic parameters are consistent with Grade I  diastolic dysfunction (impaired relaxation). Elevated left ventricular  end-diastolic pressure. The LV  strain was performed but visually does not appear accurate so is not  reported.  2. Right ventricular systolic function is normal. The right ventricular  size is normal. Tricuspid regurgitation signal is inadequate for assessing  PA pressure.  3. The mitral valve is normal in structure. Mild mitral valve  regurgitation. No evidence of mitral stenosis.  4. The aortic valve is tricuspid. Aortic valve regurgitation is not  visualized. Mild aortic valve sclerosis is present, with no evidence of  aortic valve stenosis.  5. Aortic dilatation noted. There is mild dilatation of the ascending  aorta measuring 39 mm.  6. The inferior vena cava is normal in size with greater than 50%  respiratory variability, suggesting right atrial pressure of 3 mmHg.  7. Compared to prior echo 09/04/2019, LVF has improved.  Echocardiogram 03/03/2020 IMPRESSIONS    1. Left ventricular ejection fraction, by estimation, is 25 to 30%. The  left ventricle has severely decreased function. The left ventricle  demonstrates global hypokinesis. The left ventricular internal cavity size  was severely dilated. Left ventricular  diastolic parameters are consistent with Grade I diastolic dysfunction  (impaired relaxation).  2. Right ventricular systolic function is normal. The right ventricular  size is normal.  3. The mitral valve is normal in structure. Mild mitral valve  regurgitation. No evidence of mitral stenosis.  4. The aortic valve is tricuspid. Aortic valve regurgitation is not  visualized. No aortic stenosis is present.  5. The inferior vena cava is normal in size with greater than 50%  respiratory variability, suggesting right atrial pressure of 3  mmHg.  Assessment & Plan   1.  Cardiomyopathy-continues to progress with physical activity.  Denies DOE or activity tolerance. Echocardiogram 03/03/2020 showed decreased LVEF to 25-30%, and G1 DD.Echocardiogram 6/2/2021showed an LVEF improvement to 40-45%.Blood pressure today130/82*** . ContinueEntresto 24/26   Heart healthy low-sodium diet-salty 6 given Continue to increase physical activity as tolerated Ischemic evaluation plan for after CVA recovery.  We will provide return to work note.***  Syncope-no recent events.Continues to walk daily. Was seen in the emergency department for vasovagal symptoms on 12/02/2019. He was discharged on 12/04/2019.  He was standing and moving boxes with his upper extremitiesat his place of employment.. Continue to maintain p.o. hydration Lower extremity support stockings  Essential hypertension-BP today***130/82.Well-controlled at home. Blood pressure cuff givenat last appointment Continue Metoprolol Heart healthy low-sodium diet-salty 6 given Increase physical activity as tolerated Blood pressure log  L MCA CVA-TEE showed PFO, no clot/thrombus. EP consulted for loop recorder placement. He was not a candidate for loop given his low EF and possible need for ICD.   Disposition: Follow-up with Dr. [name] in6 months.   Thomasene Ripple. Dannon Perlow NP-C    06/09/2020, 7:30 AM [facility] [address] Office [phone] Fax [phone]  Notice: This dictation was prepared with Dragon dictation along with smaller phrase technology. Any transcriptional errors that result from this process are unintentional and may not be corrected upon review.

## 2020-06-12 ENCOUNTER — Ambulatory Visit: Payer: Self-pay | Admitting: General Practice

## 2020-06-24 ENCOUNTER — Other Ambulatory Visit (HOSPITAL_COMMUNITY): Payer: Self-pay

## 2020-07-15 ENCOUNTER — Ambulatory Visit (HOSPITAL_COMMUNITY): Payer: 59 | Attending: Cardiovascular Disease

## 2020-07-15 ENCOUNTER — Encounter: Payer: Self-pay | Admitting: Cardiology

## 2020-07-15 ENCOUNTER — Other Ambulatory Visit: Payer: Self-pay

## 2020-07-15 DIAGNOSIS — I10 Essential (primary) hypertension: Secondary | ICD-10-CM

## 2020-07-15 DIAGNOSIS — I429 Cardiomyopathy, unspecified: Secondary | ICD-10-CM | POA: Diagnosis present

## 2020-07-15 DIAGNOSIS — R55 Syncope and collapse: Secondary | ICD-10-CM

## 2020-07-15 DIAGNOSIS — I639 Cerebral infarction, unspecified: Secondary | ICD-10-CM

## 2020-07-15 LAB — ECHOCARDIOGRAM COMPLETE
Area-P 1/2: 3.03 cm2
S' Lateral: 5.7 cm

## 2020-07-20 ENCOUNTER — Other Ambulatory Visit: Payer: Self-pay | Admitting: General Practice

## 2020-07-28 ENCOUNTER — Telehealth: Payer: Self-pay | Admitting: General Practice

## 2020-07-28 MED ORDER — CARVEDILOL 3.125 MG PO TABS: 3.1250 mg | ORAL_TABLET | Freq: Two times a day (BID) | ORAL | 6 refills | Status: DC

## 2020-07-28 NOTE — Telephone Encounter (Signed)
  *  STAT* If patient is at the pharmacy, call can be transferred to refill team.   1. Which medications need to be refilled? (please list name of each medication and dose if known)   carvedilol (COREG) 3.125 MG tablet    2. Which pharmacy/location (including street and city if local pharmacy) is medication to be sent to? [facility] - [place], [place] - [address]  3. Do they need a 30 day or 90 day supply? 90 days

## 2020-08-19 NOTE — Progress Notes (Signed)
Cardiology Clinic Note   Patient Name: David [name] Date of Encounter: 08/20/2020  Primary Care Provider:  Janeece Agee, NP Primary Cardiologist:  Olga Millers, MD  Patient Profile    David Roan Spinks7 year old male presents today for follow-up evaluation of his chronic systolic CHF and cardiomyopathy.  Past Medical History    Past Medical History:  Diagnosis Date  . Acute ischemic left MCA stroke (HCC)    a. 08/2019 MRI: Acute L MCA branch vessel infarctions; b. 08/2019 CTA head/neck: Patent bilat carotids/vertebrals. High grade stenosis - inf division of prox L MCA M2 branch. additional high-grade mid-dist superior L MCA M2 branch occlusion. 54mm aneurysm arising from cavernous L ICA.  [name] Cardiomyopathy (HCC)    a. 08/2019 Echo: EF 20-25%, global HK. Gr2 DD. Nl RV fxn. RVSP 30.17mmHg.  Mildly dil LA. Mod MR.  . Cerebral aneurysm    a. 08/2019 CTA Head: 20mm aneurysm arising from the cavernous L ICA.  [name] CHF (congestive heart failure) (HCC)   . CKD stage G2/A2, GFR 60-89 and albumin creatinine ratio 30-299 mg/g   . Facial mass    a. CT head: 7.0 x 2.3 cm soft tissue mass along lat aspect of L temporal bone, involving L ext ear - highly suscpicious for malignancy.  [name] Heart attack (HCC)   . Heart murmur   . Hypertension   . Moderate mitral regurgitation   . Nonischemic cardiomyopathy (HCC)   . PFO (patent foramen ovale)   . Stroke (HCC)   . Tobacco abuse    Past Surgical History:  Procedure Laterality Date  . BUBBLE STUDY  08/12/2019   Procedure: BUBBLE STUDY;  Surgeon: Parke Poisson, MD;  Location: [facility] ENDOSCOPY;  Service: Cardiology;;  . EXTERNAL EAR SURGERY    . TEE WITHOUT CARDIOVERSION N/A 08/12/2019   Procedure: TRANSESOPHAGEAL ECHOCARDIOGRAM (TEE);  Surgeon: Parke Poisson, MD;  Location: [facility] ENDOSCOPY;  Service: Cardiology;  Laterality: N/A;    Allergies  No Known Allergies  History of Present Illness    David [name] has a PMH of acute ischemic CVA,  chronic systolic CHF, HTN, chest pain, cardiomyopathy, marijuana use and tobacco abuse.  He was seen by cardiology on 08/11/2019 as a consult for new cardiomyopathy. He had not seen a healthcare provider in many years. He works 68 hours a week and had done this for around 20 years. His work was somewhat physical and he performed tasks without physical limitations. On 3/21 he noted chest discomfort that lasted for about 15 minutes and resolve spontaneously. He mentioned this to his fiance but then did not seek medical attention for further evaluation. On 08/09/2019 he returned home from work and was noted to have right facial droop and slurred speech. He was taken by EMS to [facility], ED. A CT of his head showed no hemorrhage however, subacute infarct in the mid left frontal lobe and left frontal operculum or identified. He was mildly hypertensive at presentation. He was also noted to have a 7 x 2.3 cm soft tissue mass along the lateral aspect of his left temporal bone. This was highly suspicious for malignancy. He was admitted and underwent CTA of his head and neck which showed high-grade stenosis in the inferior division of the proximal left MCA M2 branch and high-grade stenosis in the mid to distal posterior left MCA M2 branch. A 7 mm aneurysm arising from his left ICA was also noted. An MRI showed acute left MCA branch vessel infarctions consistent with embolic  stroke.  An echocardiogram 08/10/2019 showed an EF of 20-25% with global hypokinesis and grade 2 diastolic dysfunction. Moderate mitral valve regurgitation. He denied dyspnea on exertion, PND, orthopnea, and lower extremity edema.  During his lastcardiology visit3/03/2020 he denied chest pain and indicated he was feeling slightly better. EKG showed normal sinus rhythm with PVCs and one run of nonsustained ventricular tachycardia that lasted 15 beats. TEE 08/12/2019 showed EF 20-25%, global hypokinesis, mildly dilated left atria, and  tiny patent foramen ovale. Cardiac MRI 08/13/2019 showed severely dilated left ventricle with mild concentric hypertrophy and severely decreased systolic function LVEF 20%. Mild mitral and tricuspid regurgitation. Findings suspicious for ischemic cardiomyopathy, ischemic work-up recommended. Evidence of prethrombotic state there was no evidence of clot/thrombus however it was believed that thrombus may have been cause. Patient was anticoagulated previously by neurology service. With a plan to transition losartan to [facility] as outpatient. He was given LifeVest at discharge with a plan for possible ICD in 3 months.  He presented tothe clinic 6/1/21for follow-up evaluation and stateedhe didnot feel any different than he did prior to going to the hospital. He hadbeen wearing his LifeVest as directed. He didnot have any increased shortness of breath and denies chest pain. I stoppedhis losartan and startedhim on Entresto. IorderedBMP checked in 1 week and scheduledfollow-up in 3 to 4 weeks for further evaluation.  He was seen in follow-up evaluation by Dr. Ladona Ridgel on 11/12/2019. During that time he was doing well. His Eliquis was stopped and he was started on 81 mg aspirin. His follow-up echocardiogram 11/06/2019 showed an LVEF improvement to 40-45%. He was instructed to follow-up in 1 year.  He presented to the emergency department on 11/21/2019 with complaints of coughing up/vomiting blood. His nausea was controlled with Zofran. CT abdomen showed no acute findings. It was felt that his symptoms were related to gastroenteritis. He denied dysuria or hematuria. He was discharged in stable condition. His creatinine at the time was 1.86.  He presentedto the clinic 6/23/21and statedhis Eliquis was stopped by Dr. Ladona Ridgel and he is now taking aspirin. He had an acute episode of hematemesis and presented to the emergency department. He has had no more episodes of GI bleeding.  Hewasback at work and doing 40 hours/week. He statedhe hadsome back pain which appears to be muscular in nature. He feltoverall he hadmore energy and wasdoing well. Ididnot increase his Sherryll Burger due to his recent bump in creatinine surrounding his acute episode of GI bleeding. Iplannedfollow-up with him virtually in 1 month, and order a BMP for further evaluation.  He was recently seen in the emergency department for vasovagal symptoms on 12/02/2019. He was discharged on 12/04/2019. He was noted to have 1 syncopal episode. He was standing and moving boxes with his upper extremities. Boxes were noted to be medium weight. He lost consciousness and did not recall any prodromal symptoms. EMS was called and he was found to be diaphoretic and hypotensive with a blood pressure in 70s. He received IV hydration. EKG showed no arrhythmias. Recent CTA showed no coronary disease. His metoprolol was continued at 12.5 and his Entresto and carvedilol were held. His renal function improved with hydration. He was instructed to have outpatient renal ultrasound due to a renal lesion. He suffered a rib fracture involving the left 10th 11th and 12th ribs.  He presentedto the clinic 7/8/21for follow-up evaluation and statedhe feltwell. He had no further episodes of syncope. He statedthat his ribs continuedto hurt. He presentedwith his wife and his wife statedthat  his place employmentwasnot allowing him to claimWorkmen's Comp. Due to his blood pressure I didnot make any medication changes. I hadhim continue to increase his physical activity slowly. I educated about bracing ribs for pain relief with coughing sneezing, and movements. Iplannedfollow-up in 1 month for reevaluation.  He presentedto the clinic7/22/2021for follow-up evaluation and statedhe hadslowly started to increase his physical activity. He hadbeen to see his PCP who prescribed tramadol for his rib pain.  He continuedto try to work with his place of employment to make this case Workmen's Comp. Him and his wife hadcontacted a lawyer to help him with this case. I stoppedhis metoprolol and restartedhis Entresto. Weplanned tohave him follow-up in 4 weeks for reevaluation and possible up titration. Ihadhim continue to increase his physical activity slowly as tolerated. Wegavepatient assistance contact informationfor help with his medication cost.  He presented to the clinic 8/202021 for follow-up evaluation and statedhe was feeling much better with adding Entresto back to his medication regimen. He had not been approved yet for patient assistance and had no insurance. We  gave Entresto samples. He did state however that he  had episodes of dizziness upon standing. He wished to return to work so that he may regain his insurance. His blood pressures had been stable at home. I  asked him to return to clinic for follow-up in 2 weeks to reevaluate his dizziness and repeat his echocardiogram in 1 month. I  Repeated his BMP.  He presented to the clinic 02/17/2020 for follow-up evaluation and stated he felt well.  He has had no further episodes of lightheadedness or dizziness.  He stated he would like to return to work and needed a note indicating that he did not have any restrictions.  His blood pressure had remained stable.  I  ordered an echocardiogram, refilled his Entresto, atorvastatin, and planned follow-up with Dr. [name] in 3 months.  Echocardiogram 07/15/2020 showed an LVEF of 30-35% which was slight improvement in his systolic function compared to previous study.  He presents to the clinic today for follow-up evaluation states he has returned to work and is working in minimum of 35--40 hours weekly.  He has been given allowances for taking more frequent breaks and has been given 15-minute breaks on before and after lunch.  He continues to follow a low-sodium diet.  He reports that he is  now smoking about 4 cigarettes/day and is trying to quit.  I will repeat his BMP today, refill his medication, give him smoking cessation information, have him increase his physical activity as tolerated, and follow-up with Dr. [name] in 6 months.  Today hedenies chest pain, shortness of breath, lower extremity edema, fatigue, palpitations, melena, hematuria, hemoptysis, diaphoresis, weakness, presyncope, syncope, orthopnea, and PND.  Home Medications    Prior to Admission medications   Medication Sig Start Date End Date Taking? Authorizing Provider  Acetaminophen (TYLENOL PO) Take by mouth.    [provider]  aspirin EC 81 MG tablet Take 1 tablet (81 mg total) by mouth daily. 11/12/19   Marinus Maw, MD  atorvastatin (LIPITOR) 80 MG tablet Take 1 tablet (80 mg total) by mouth daily at 6 PM. 02/17/20   Cong Hightower, Thomasene Ripple, NP  carvedilol (COREG) 3.125 MG tablet Take 1 tablet (3.125 mg total) by mouth 2 (two) times daily. 07/28/20   Lewayne Bunting, MD  nicotine (NICODERM CQ) 21 mg/24hr patch Place 1 patch (21 mg total) onto the skin daily. 09/12/19   Janeece Agee, NP  pantoprazole (  PROTONIX) 40 MG tablet Take 1 tablet (40 mg total) by mouth daily. 01/06/20   Lewayne Bunting, MD  sacubitril-valsartan (ENTRESTO) 24-26 MG Take 1 tablet by mouth 2 (two) times daily. 02/17/20   Ronney Asters, NP    Family History    Family History  Problem Relation Age of Onset  . Stroke Sister   . Cancer Mother        died @ 20  . Other Father        died of CO poisoning as a young man.   He indicated that his mother is deceased. He indicated that his father is deceased. He indicated that his sister is alive.  Social History    Social History   Socioeconomic History  . Marital status: Single    Spouse name: Not on file  . Number of children: 6  . Years of education: Not on file  . Highest education level: Not on file  Occupational History  . Not on file  Tobacco Use  . Smoking  status: Current Every Day Smoker    Packs/day: 0.25    Years: 35.00    Pack years: 8.75    Types: Cigarettes  . Smokeless tobacco: Never Used  Vaping Use  . Vaping Use: Never used  Substance and Sexual Activity  . Alcohol use: No  . Drug use: Not Currently    Types: Marijuana    Comment: smokes several blunts on the weekend.  . Sexual activity: Yes  Other Topics Concern  . Not on file  Social History Narrative   ** Merged History Encounter **       Lives in [place] w/ fiancee.  Does not routinely exercise but has very busy work-life, working M-F 12 hrs shifts and another 8 hrs on Saturdays.  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   Social Determinants of Health   Financial Resource Strain: Not on file  Food Insecurity: Not on file  Transportation Needs: Not on file  Physical Activity: Not on file  Stress: Not on file  Social Connections: Not on file  Intimate Partner Violence: Not on file     Review of Systems    General:  No chills, fever, night sweats or weight changes.  Cardiovascular:  No chest pain, dyspnea on exertion, edema, orthopnea, palpitations, paroxysmal nocturnal dyspnea. Dermatological: No rash, lesions/masses Respiratory: No cough, dyspnea Urologic: No hematuria, dysuria Abdominal:   No nausea, vomiting, diarrhea, bright red blood per rectum, melena, or hematemesis Neurologic:  No visual changes, wkns, changes in mental status. All other systems reviewed and are otherwise negative except as noted above.  Physical Exam    VS:  BP (!) 142/76   Pulse 74   Ht 5\' 11"  (1.803 m)   Wt 166 lb 12.8 oz (75.7 kg)   BMI 23.26 kg/m  , BMI Body mass index is 23.26 kg/m. GEN: Well nourished, well developed, in no acute distress. HEENT: normal. Neck: Supple, no JVD, carotid bruits, or masses. Cardiac: RRR, no murmurs, rubs, or gallops. No clubbing, cyanosis, edema.  Radials/DP/PT 2+ and equal bilaterally.  Respiratory:  Respirations regular and  unlabored, clear to auscultation bilaterally. GI: Soft, nontender, nondistended, BS + x 4. MS: no deformity or atrophy. Skin: warm and dry, no rash. Neuro:  Strength and sensation are intact. Psych: Normal affect.  Accessory Clinical Findings    Recent Labs: 12/16/2019: ALT 143; Hemoglobin 12.7; Platelets 231 01/24/2020: BUN 13; Creatinine, Ser 1.24; Potassium  5.1; Sodium 146   Recent Lipid Panel    Component Value Date/Time   CHOL 93 (L) 08/21/2019 0908   TRIG 57 08/21/2019 0908   HDL 29 (L) 08/21/2019 0908   CHOLHDL 3.2 08/21/2019 0908   CHOLHDL 4.4 08/10/2019 0439   VLDL 18 08/10/2019 0439   LDLCALC 51 08/21/2019 0908    ECG personally reviewed by me today-normal sinus rhythm minimal voltage criteria for LVH T wave abnormality consider inferior lateral ischemia 74 bpm  Echocardiogram 07/15/2020 IMPRESSIONS    1. No left ventricular thrombus is seen. Left ventricular ejection  fraction, by estimation, is 30 to 35%. The left ventricle has moderately  decreased function. The left ventricle demonstrates global hypokinesis.  The left ventricular internal cavity size  was moderately dilated. Left ventricular diastolic parameters are  consistent with Grade II diastolic dysfunction (pseudonormalization).  Elevated left atrial pressure. The average left ventricular global  longitudinal strain is -12.7 %.  2. Right ventricular systolic function is normal. The right ventricular  size is normal. There is mildly elevated pulmonary artery systolic  pressure. The estimated right ventricular systolic pressure is 34.6 mmHg.  3. Left atrial size was mildly dilated.  4. The mitral valve is normal in structure. Mild to moderate mitral valve  regurgitation. No evidence of mitral stenosis.  5. The aortic valve is normal in structure. Aortic valve regurgitation is  not visualized. No aortic stenosis is present.  6. The inferior vena cava is normal in size with <50% respiratory   variability, suggesting right atrial pressure of 8 mmHg.   Comparison(s): Prior images reviewed side by side. Although there is  slight improment in left ventricular systolic function ands there is  evidence of improved cardiac output, there is now also evidence of  increased mean left atrial filling pressures.   Assessment & Plan   1.  Cardiomyopathy-has returned to work and is doing well.   Most recent echocardiogram showed an EF of 30-35%. Echocardiogram 09/04/2019 stable compared to previous LVEF 20-25%, G2 DD, trivial aortic valve regurgitation, left atrium mildly dilated.Echocardiogram 6/2/2021showed an LVEF improvement to 40-45%.Blood pressure today130/82 BMP stable ContinueEntresto 24/26   Heart healthy low-sodium diet-salty 6 given Continue to increase physical activity as tolerated Ischemic evaluation plan for after CVA recovery. Order BMP  Syncope-no further episodes.Continues walking daily. Was seen in the emergency department for vasovagal symptoms on 12/02/2019. He was discharged on 12/04/2019.  He was standing and moving boxes with his upper extremitiesat his place of employment.. Maintain p.o. hydration Continue to monitor  Essential hypertension-BP [id number]/76.Well-controlled at home 120s over 70s. Blood pressure cuff givenat last appointment Continue Metoprolol Heart healthy low-sodium diet-salty 6 given Increase physical activity as tolerated Blood pressure log  L MCA CVA-TEE showed PFO, no clot/thrombus. EP consulted.He was not a candidate for loop given his low EF and possible need for ICD.  Tobacco use-smokes around 4 cigarettes/day.  Smoking cessation recommended Smoking cessation information given.   Disposition: Follow-up withme or Dr. Jens Somrenshaw in6 months   Thomasene RippleJesse M. Tahmid Stonehocker NP-C    08/20/2020, 3:10 PM [facility] [address] Office [phone] Fax [phone]  Notice: This  dictation was prepared with Dragon dictation along with smaller phrase technology. Any transcriptional errors that result from this process are unintentional and may not be corrected upon review.  I spent 12 minutes examining this patient, reviewing medications, and using patient centered shared decision making involving her cardiac care.  Prior to her visit I spent  greater than 20 minutes reviewing her past medical history,  medications, and prior cardiac tests.

## 2020-08-20 ENCOUNTER — Encounter: Payer: Self-pay | Admitting: General Practice

## 2020-08-20 ENCOUNTER — Ambulatory Visit (INDEPENDENT_AMBULATORY_CARE_PROVIDER_SITE_OTHER): Payer: 59 | Admitting: General Practice

## 2020-08-20 ENCOUNTER — Other Ambulatory Visit: Payer: Self-pay

## 2020-08-20 VITALS — BP 142/76 | HR 74 | Ht 71.0 in | Wt 166.8 lb

## 2020-08-20 DIAGNOSIS — I10 Essential (primary) hypertension: Secondary | ICD-10-CM

## 2020-08-20 DIAGNOSIS — Z720 Tobacco use: Secondary | ICD-10-CM

## 2020-08-20 DIAGNOSIS — R55 Syncope and collapse: Secondary | ICD-10-CM

## 2020-08-20 DIAGNOSIS — I429 Cardiomyopathy, unspecified: Secondary | ICD-10-CM | POA: Diagnosis not present

## 2020-08-20 DIAGNOSIS — I639 Cerebral infarction, unspecified: Secondary | ICD-10-CM | POA: Diagnosis not present

## 2020-08-20 DIAGNOSIS — Z79899 Other long term (current) drug therapy: Secondary | ICD-10-CM

## 2020-08-20 MED ORDER — CARVEDILOL 3.125 MG PO TABS: 3.1250 mg | ORAL_TABLET | Freq: Two times a day (BID) | ORAL | 3 refills | Status: DC

## 2020-08-20 MED ORDER — ATORVASTATIN CALCIUM 80 MG PO TABS: 80.0000 mg | ORAL_TABLET | Freq: Every day | ORAL | 3 refills | Status: DC

## 2020-08-20 NOTE — Patient Instructions (Signed)
Medication Instructions:  The current medical regimen is effective;  continue present plan and medications as directed. Please refer to the Current Medication list given to you today., *If you need a refill on your cardiac medications before your next appointment, please call your pharmacy*  Lab Work: BMET TODAY If you have labs (blood work) drawn today and your tests are completely normal, you will receive your results only by:  MyChart Message (if you have MyChart) OR A paper copy in the mail.  If you have any lab test that is abnormal or we need to change your treatment, we will call you to review the results. You may go to any [facility] that is convenient for you however, we do have a lab in our office that is able to assist you. You DO NOT need an appointment for our lab. The lab is open 8:00am and closes at 4:00pm. Lunch 12:45 - 1:45pm.  Testing/Procedures: NONE  Special Instructions PLEASE READ AND FOLLOW SMOKING CESSATION TIPS-ATTACHED  PLEASE READ AND FOLLOW SALTY 6-ATTACHED-1,800 mg daily  PLEASE INCREASE PHYSICAL ACTIVITY AS TOLERATED  Follow-Up: Your next appointment:  6 month(s) In Person with Olga Millers, MD ONLY  Please call our office 2 months in advance to schedule this appointment   At [facility], you and your health needs are our priority.  As part of our continuing mission to provide you with exceptional heart care, we have created designated Provider Care Teams.  These Care Teams include your primary Cardiologist (physician) and Advanced Practice Providers (APPs -  Physician Assistants and Nurse Practitioners) who all work together to provide you with the care you need, when you need it.  We recommend signing up for the patient portal called "MyChart".  Sign up information is provided on this After Visit Summary.  MyChart is used to connect with patients for Virtual Visits (Telemedicine).  Patients are able to view lab/test results, encounter notes, upcoming  appointments, etc.  Non-urgent messages can be sent to your provider as well.   To learn more about what you can do with MyChart, go to [email or web address].         Steps to Quit Smoking Smoking tobacco is the leading cause of preventable death. It can affect almost every organ in the body. Smoking puts you and people around you at risk for many serious, long-lasting (chronic) diseases. Quitting smoking can be hard, but it is one of the best things that you can do for your health. It is never too late to quit. How do I get ready to quit? When you decide to quit smoking, make a plan to help you succeed. Before you quit:  Pick a date to quit. Set a date within the next 2 weeks to give you time to prepare.  Write down the reasons why you are quitting. Keep this list in places where you will see it often.  Tell your family, friends, and co-workers that you are quitting. Their support is important.  Talk with your doctor about the choices that may help you quit.  Find out if your health insurance will pay for these treatments.  Know the people, places, things, and activities that make you want to smoke (triggers). Avoid them. What first steps can I take to quit smoking?  Throw away all cigarettes at home, at work, and in your car.  Throw away the things that you use when you smoke, such as ashtrays and lighters.  Clean your car. Make sure to empty the ashtray.  Clean your home, including curtains and carpets. What can I do to help me quit smoking? Talk with your doctor about taking medicines and seeing a counselor at the same time. You are more likely to succeed when you do both.  If you are pregnant or breastfeeding, talk with your doctor about counseling or other ways to quit smoking. Do not take medicine to help you quit smoking unless your doctor tells you to do so. To quit smoking: Quit right away  Quit smoking totally, instead of slowly cutting back on how much you smoke  over a period of time.  Go to counseling. You are more likely to quit if you go to counseling sessions regularly. Take medicine You may take medicines to help you quit. Some medicines need a prescription, and some you can buy over-the-counter. Some medicines may contain a drug called nicotine to replace the nicotine in cigarettes. Medicines may:  Help you to stop having the desire to smoke (cravings).  Help to stop the problems that come when you stop smoking (withdrawal symptoms). Your doctor may ask you to use:  Nicotine patches, gum, or lozenges.  Nicotine inhalers or sprays.  Non-nicotine medicine that is taken by mouth. Find resources Find resources and other ways to help you quit smoking and remain smoke-free after you quit. These resources are most helpful when you use them often. They include:  Online chats with a Veterinary surgeon.  Phone quitlines.  Printed Materials engineer.  Support groups or group counseling.  Text messaging programs.  Mobile phone apps. Use apps on your mobile phone or tablet that can help you stick to your quit plan. There are many free apps for mobile phones and tablets as well as websites. Examples include Quit Guide from the [employer] and smokefree.gov   What things can I do to make it easier to quit?  Talk to your family and friends. Ask them to support and encourage you.  Call a phone quitline (1-800-QUIT-NOW), reach out to support groups, or work with a Veterinary surgeon.  Ask people who smoke to not smoke around you.  Avoid places that make you want to smoke, such as: ? Bars. ? Parties. ? Smoke-break areas at work.  Spend time with people who do not smoke.  Lower the stress in your life. Stress can make you want to smoke. Try these things to help your stress: ? Getting regular exercise. ? Doing deep-breathing exercises. ? Doing yoga. ? Meditating. ? Doing a body scan. To do this, close your eyes, focus on one area of your body at a time from head  to toe. Notice which parts of your body are tense. Try to relax the muscles in those areas.   How will I feel when I quit smoking? Day 1 to 3 weeks Within the first 24 hours, you may start to have some problems that come from quitting tobacco. These problems are very bad 2-3 days after you quit, but they do not often last for more than 2-3 weeks. You may get these symptoms:  Mood swings.  Feeling restless, nervous, angry, or annoyed.  Trouble concentrating.  Dizziness.  Strong desire for high-sugar foods and nicotine.  Weight gain.  Trouble pooping (constipation).  Feeling like you may vomit (nausea).  Coughing or a sore throat.  Changes in how the medicines that you take for other issues work in your body.  Depression.  Trouble sleeping (insomnia). Week 3 and afterward After the first 2-3 weeks of quitting, you may start  to notice more positive results, such as:  Better sense of smell and taste.  Less coughing and sore throat.  Slower heart rate.  Lower blood pressure.  Clearer skin.  Better breathing.  Fewer sick days. Quitting smoking can be hard. Do not give up if you fail the first time. Some people need to try a few times before they succeed. Do your best to stick to your quit plan, and talk with your doctor if you have any questions or concerns. Summary  Smoking tobacco is the leading cause of preventable death. Quitting smoking can be hard, but it is one of the best things that you can do for your health.  When you decide to quit smoking, make a plan to help you succeed.  Quit smoking right away, not slowly over a period of time.  When you start quitting, seek help from your doctor, family, or friends. This information is not intended to replace advice given to you by your health care provider. Make sure you discuss any questions you have with your health care provider. Document Revised: 02/15/2019 Document Reviewed: 08/11/2018 Elsevier Patient  Education  2021 [employer].

## 2020-08-21 LAB — BASIC METABOLIC PANEL
BUN/Creatinine Ratio: 11 (ref 9–20)
BUN: 12 mg/dL (ref 6–24)
CO2: 24 mmol/L (ref 20–29)
Calcium: 9 mg/dL (ref 8.7–10.2)
Chloride: 103 mmol/L (ref 96–106)
Creatinine, Ser: 1.13 mg/dL (ref 0.76–1.27)
Glucose: 80 mg/dL (ref 65–99)
Potassium: 3.8 mmol/L (ref 3.5–5.2)
Sodium: 143 mmol/L (ref 134–144)
eGFR: 76 mL/min/{1.73_m2} (ref >59)

## 2020-08-25 NOTE — Addendum Note (Signed)
Addended by: Myna Hidalgo A on: 08/25/2020 01:15 PM   Modules accepted: Orders

## 2020-09-08 ENCOUNTER — Telehealth: Payer: Self-pay | Admitting: General Practice

## 2020-09-08 NOTE — Telephone Encounter (Signed)
*  STAT* If patient is at the pharmacy, call can be transferred to refill team.   1. Which medications need to be refilled? (please list name of each medication and dose if known)  Need a new prescription to local pharmacy for his Entresto  2. Which pharmacy/location (including street and city if local pharmacy) is medication to be sent to? [facility], [place]  3. Do they need a 30 day or 90 day supply? 8 until his medicine comes

## 2020-09-09 MED ORDER — ENTRESTO 24-26 MG PO TABS: 1.0000 | ORAL_TABLET | Freq: Two times a day (BID) | ORAL | 11 refills | Status: DC

## 2020-11-20 ENCOUNTER — Other Ambulatory Visit: Payer: Self-pay | Admitting: Cardiology

## 2020-11-23 ENCOUNTER — Other Ambulatory Visit: Payer: Self-pay

## 2020-12-09 ENCOUNTER — Other Ambulatory Visit (HOSPITAL_BASED_OUTPATIENT_CLINIC_OR_DEPARTMENT_OTHER): Payer: Self-pay

## 2020-12-09 MED ORDER — ENTRESTO 24-26 MG PO TABS: 1.0000 | ORAL_TABLET | Freq: Two times a day (BID) | ORAL | 11 refills | Status: DC

## 2021-02-20 NOTE — Progress Notes (Signed)
HPI: Follow-up nonischemic cardiomyopathy and chronic systolic congestive heart failure.  Patient admitted March 2021 with CVA. CTA of his head and neck showed high-grade stenosis in the inferior division of the proximal left MCA M2 branch and high-grade stenosis in the mid to distal posterior left MCA M2 branch.  A 7 mm aneurysm arising from his left ICA was also noted.  An MRI showed acute left MCA branch vessel infarctions consistent with embolic stroke.  Echocardiogram at that time showed ejection fraction 20 to 25%, grade 2 diastolic dysfunction and moderate mitral regurgitation.  Patient was treated medically.  TEE showed ejection fraction 20 to 25%, patent foramen ovale.  Cardiac MRI March 2021 showed ejection fraction 20%, mild mitral and tricuspid regurgitation.  Findings felt suspicious for ischemic cardiomyopathy.  It was felt that thrombus may have been the cause of his CVA and he was anticoagulated.  Cardiac CTA March 2021 showed calcium score of 0 and no coronary disease.  Last echocardiogram February 2022 showed ejection fraction 30 to 35%, moderate left ventricular enlargement, grade 2 diastolic dysfunction, mild left atrial enlargement, mild to moderate mitral regurgitation.  Since last seen patient denies dyspnea, chest pain, palpitations or syncope.  Current Outpatient Medications  Medication Sig Dispense Refill   Acetaminophen (TYLENOL PO) Take by mouth.     aspirin EC 81 MG tablet Take 1 tablet (81 mg total) by mouth daily. 90 tablet 3   atorvastatin (LIPITOR) 80 MG tablet Take 1 tablet (80 mg total) by mouth daily at 6 PM. 90 tablet 3   carvedilol (COREG) 3.125 MG tablet Take 1 tablet (3.125 mg total) by mouth 2 (two) times daily. 180 tablet 3   nicotine (NICODERM CQ) 21 mg/24hr patch Place 1 patch (21 mg total) onto the skin daily. 28 patch 1   pantoprazole (PROTONIX) 40 MG tablet Take 1 tablet by mouth once daily 90 tablet 1   sacubitril-valsartan (ENTRESTO) 24-26 MG Take  1 tablet by mouth 2 (two) times daily. 60 tablet 11   No current facility-administered medications for this visit.     Past Medical History:  Diagnosis Date   Acute ischemic left MCA stroke (HCC)    a. 08/2019 MRI: Acute L MCA branch vessel infarctions; b. 08/2019 CTA head/neck: Patent bilat carotids/vertebrals. High grade stenosis - inf division of prox L MCA M2 branch. additional high-grade mid-dist superior L MCA M2 branch occlusion. 7mm aneurysm arising from cavernous L ICA.   Cardiomyopathy (HCC)    a. 08/2019 Echo: EF 20-25%, global HK. Gr2 DD. Nl RV fxn. RVSP 30.26mmHg.  Mildly dil LA. Mod MR.   Cerebral aneurysm    a. 08/2019 CTA Head: 75mm aneurysm arising from the cavernous L ICA.   CHF (congestive heart failure) (HCC)    CKD stage G2/A2, GFR 60-89 and albumin creatinine ratio 30-299 mg/g    Facial mass    a. CT head: 7.0 x 2.3 cm soft tissue mass along lat aspect of L temporal bone, involving L ext ear - highly suscpicious for malignancy.   Heart attack (HCC)    Heart murmur    Hypertension    Moderate mitral regurgitation    Nonischemic cardiomyopathy (HCC)    PFO (patent foramen ovale)    Stroke (HCC)    Tobacco abuse     Past Surgical History:  Procedure Laterality Date   BUBBLE STUDY  08/12/2019   Procedure: BUBBLE STUDY;  Surgeon: Parke Poisson, MD;  Location: [facility] ENDOSCOPY;  Service:  Cardiology;;   EXTERNAL EAR SURGERY     TEE WITHOUT CARDIOVERSION N/A 08/12/2019   Procedure: TRANSESOPHAGEAL ECHOCARDIOGRAM (TEE);  Surgeon: Parke Poisson, MD;  Location: [facility] ENDOSCOPY;  Service: Cardiology;  Laterality: N/A;    Social History   Socioeconomic History   Marital status: Single    Spouse name: Not on file   Number of children: 6   Years of education: Not on file   Highest education level: Not on file  Occupational History   Not on file  Tobacco Use   Smoking status: Every Day    Packs/day: 0.25    Years: 35.00    Pack years: 8.75    Types: Cigarettes    Smokeless tobacco: Never  Vaping Use   Vaping Use: Never used  Substance and Sexual Activity   Alcohol use: No   Drug use: Not Currently    Types: Marijuana    Comment: smokes several blunts on the weekend.   Sexual activity: Yes  Other Topics Concern   Not on file  Social History Narrative   ** Merged History Encounter **       Lives in [place] w/ fiancee.  Does not routinely exercise but has very busy work-life, working M-F 12 hrs shifts and another 8 hrs on Saturdays.  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   Social Determinants of Health   Financial Resource Strain: Not on file  Food Insecurity: Not on file  Transportation Needs: Not on file  Physical Activity: Not on file  Stress: Not on file  Social Connections: Not on file  Intimate Partner Violence: Not on file    Family History  Problem Relation Age of Onset   Stroke Sister    Cancer Mother        died @ 55   Other Father        died of CO poisoning as a young man.    ROS: no fevers or chills, productive cough, hemoptysis, dysphasia, odynophagia, melena, hematochezia, dysuria, hematuria, rash, seizure activity, orthopnea, PND, pedal edema, claudication. Remaining systems are negative.  Physical Exam: Well-developed well-nourished in no acute distress.  Skin is warm and dry.  HEENT is normal.  Neck is supple.  Chest is clear to auscultation with normal expansion.  Cardiovascular exam is regular rate and rhythm.  Abdominal exam nontender or distended. No masses palpated. Extremities show no edema. neuro grossly intact  A/P  1 nonischemic cardiomyopathy-etiology of cardiomyopathy unclear.  Previous CTA showed no coronary disease.  Plan to continue Entresto and carvedilol.  Increase carvedilol to 6.25 mg twice daily.  In 3 months repeat echocardiogram to reassess LV function.  If ejection fraction less than 35% will need to consider ICD.  2 hypertension-patient's blood pressure is  controlled.  Medication adjustments as outlined above under nonischemic cardiomyopathy.  3 history of syncope-no recurrent episodes.  4 prior CVA  5 tobacco abuse-patient counseled on discontinuing.  6 previous abnormality noted in right kidney on abdominal CT-we will arrange renal ultrasound as recommended.  Olga Millers, MD

## 2021-02-23 ENCOUNTER — Encounter: Payer: Self-pay | Admitting: Cardiology

## 2021-02-23 ENCOUNTER — Ambulatory Visit (INDEPENDENT_AMBULATORY_CARE_PROVIDER_SITE_OTHER): Payer: 59 | Admitting: Cardiology

## 2021-02-23 ENCOUNTER — Other Ambulatory Visit: Payer: Self-pay

## 2021-02-23 VITALS — BP 124/76 | HR 75 | Ht 71.0 in | Wt 155.0 lb

## 2021-02-23 DIAGNOSIS — I420 Dilated cardiomyopathy: Secondary | ICD-10-CM | POA: Diagnosis not present

## 2021-02-23 DIAGNOSIS — Z720 Tobacco use: Secondary | ICD-10-CM | POA: Diagnosis not present

## 2021-02-23 DIAGNOSIS — I10 Essential (primary) hypertension: Secondary | ICD-10-CM | POA: Diagnosis not present

## 2021-02-23 DIAGNOSIS — N2889 Other specified disorders of kidney and ureter: Secondary | ICD-10-CM

## 2021-02-23 DIAGNOSIS — R55 Syncope and collapse: Secondary | ICD-10-CM

## 2021-02-23 MED ORDER — CARVEDILOL 6.25 MG PO TABS: 6.2500 mg | ORAL_TABLET | Freq: Two times a day (BID) | ORAL | 3 refills | Status: DC

## 2021-02-23 MED ORDER — ATORVASTATIN CALCIUM 80 MG PO TABS: 80.0000 mg | ORAL_TABLET | Freq: Every day | ORAL | 3 refills | Status: DC

## 2021-02-23 NOTE — Patient Instructions (Signed)
Medication Instructions:   INCREASE CARVEDILOL TO 6.25 MG TWICE DAILY= 2 OF THE 3.125 MG TABLETS TWICE DAILY  *If you need a refill on your cardiac medications before your next appointment, please call your pharmacy*   Testing/Procedures:  Your physician has requested that you have an echocardiogram. Echocardiography is a painless test that uses sound waves to create images of your heart. It provides your doctor with information about the size and shape of your heart and how well your heart's chambers and valves are working. This procedure takes approximately one hour. There are no restrictions for this procedure. [address]-SCHEDULE IN 3 MONTHS   Follow-Up: At [facility], you and your health needs are our priority.  As part of our continuing mission to provide you with exceptional heart care, we have created designated Provider Care Teams.  These Care Teams include your primary Cardiologist (physician) and Advanced Practice Providers (APPs -  Physician Assistants and Nurse Practitioners) who all work together to provide you with the care you need, when you need it.  We recommend signing up for the patient portal called "MyChart".  Sign up information is provided on this After Visit Summary.  MyChart is used to connect with patients for Virtual Visits (Telemedicine).  Patients are able to view lab/test results, encounter notes, upcoming appointments, etc.  Non-urgent messages can be sent to your provider as well.   To learn more about what you can do with MyChart, go to [email or web address].    Your next appointment:   6 month(s)  The format for your next appointment:   In Person  Provider:   Olga Millers, MD

## 2021-03-17 ENCOUNTER — Ambulatory Visit (HOSPITAL_COMMUNITY): Admission: RE | Admit: 2021-03-17 | Payer: 59 | Source: Ambulatory Visit

## 2021-03-25 ENCOUNTER — Other Ambulatory Visit: Payer: Self-pay

## 2021-03-25 ENCOUNTER — Ambulatory Visit (HOSPITAL_COMMUNITY)
Admission: RE | Admit: 2021-03-25 | Discharge: 2021-03-25 | Disposition: A | Discharge status: Home / Self Care | Payer: 59 | Source: Ambulatory Visit | Attending: Cardiology | Admitting: Cardiology

## 2021-03-25 DIAGNOSIS — N2889 Other specified disorders of kidney and ureter: Secondary | ICD-10-CM | POA: Insufficient documentation

## 2021-03-26 ENCOUNTER — Encounter: Payer: Self-pay | Admitting: *Deleted

## 2021-04-06 ENCOUNTER — Telehealth: Payer: Self-pay | Admitting: Cardiology

## 2021-04-06 NOTE — Telephone Encounter (Signed)
Patient calling the office for samples of medication:   1.  What medication and dosage are you requesting samples for?Entresto  2.  Are you currently out of this medication? yes    

## 2021-04-06 NOTE — Telephone Encounter (Signed)
Left VM informing patient that samples are ready for him at [facility].

## 2021-05-11 ENCOUNTER — Other Ambulatory Visit (HOSPITAL_COMMUNITY): Payer: 59

## 2021-05-11 ENCOUNTER — Encounter (HOSPITAL_COMMUNITY): Payer: Self-pay

## 2021-05-11 ENCOUNTER — Encounter (HOSPITAL_COMMUNITY): Payer: Self-pay | Admitting: Cardiology

## 2021-05-11 NOTE — Progress Notes (Signed)
Verified appointment "no show" status with G. Andrews at 10:29.

## 2021-06-09 ENCOUNTER — Telehealth: Payer: Self-pay | Admitting: *Deleted

## 2021-06-09 NOTE — Telephone Encounter (Signed)
Spoke with pt, aware he has been approved for patient assistance for entresto. Pharmacy telephone number and patient ID given to the patient.

## 2021-07-29 ENCOUNTER — Other Ambulatory Visit: Payer: Self-pay

## 2021-07-29 ENCOUNTER — Encounter (HOSPITAL_COMMUNITY): Payer: Self-pay

## 2021-07-29 ENCOUNTER — Encounter (HOSPITAL_COMMUNITY): Payer: Self-pay | Admitting: Emergency Medicine

## 2021-07-29 ENCOUNTER — Emergency Department (HOSPITAL_COMMUNITY): Payer: 59

## 2021-07-29 ENCOUNTER — Ambulatory Visit (HOSPITAL_COMMUNITY)
Admission: EM | Admit: 2021-07-29 | Discharge: 2021-07-29 | Payer: 59 | Attending: Internal Medicine | Admitting: Internal Medicine

## 2021-07-29 ENCOUNTER — Emergency Department (HOSPITAL_COMMUNITY)
Admission: EM | Admit: 2021-07-29 | Discharge: 2021-07-29 | Disposition: A | Discharge status: Home / Self Care | Payer: 59 | Attending: Emergency Medicine | Admitting: Emergency Medicine

## 2021-07-29 DIAGNOSIS — Z7982 Long term (current) use of aspirin: Secondary | ICD-10-CM | POA: Insufficient documentation

## 2021-07-29 DIAGNOSIS — I509 Heart failure, unspecified: Secondary | ICD-10-CM | POA: Insufficient documentation

## 2021-07-29 DIAGNOSIS — D72829 Elevated white blood cell count, unspecified: Secondary | ICD-10-CM | POA: Diagnosis not present

## 2021-07-29 DIAGNOSIS — L02215 Cutaneous abscess of perineum: Secondary | ICD-10-CM | POA: Insufficient documentation

## 2021-07-29 DIAGNOSIS — N189 Chronic kidney disease, unspecified: Secondary | ICD-10-CM | POA: Insufficient documentation

## 2021-07-29 DIAGNOSIS — K611 Rectal abscess: Secondary | ICD-10-CM

## 2021-07-29 DIAGNOSIS — I130 Hypertensive heart and chronic kidney disease with heart failure and stage 1 through stage 4 chronic kidney disease, or unspecified chronic kidney disease: Secondary | ICD-10-CM | POA: Diagnosis not present

## 2021-07-29 DIAGNOSIS — Z79899 Other long term (current) drug therapy: Secondary | ICD-10-CM | POA: Insufficient documentation

## 2021-07-29 LAB — CBC WITH DIFFERENTIAL/PLATELET
Abs Immature Granulocytes: 0.03 10*3/uL (ref 0.00–0.07)
Basophils Absolute: 0 10*3/uL (ref 0.0–0.1)
Basophils Relative: 0 %
Eosinophils Absolute: 0.1 10*3/uL (ref 0.0–0.5)
Eosinophils Relative: 1 %
HCT: 38.2 % — ABNORMAL LOW (ref 39.0–52.0)
Hemoglobin: 12.9 g/dL — ABNORMAL LOW (ref 13.0–17.0)
Immature Granulocytes: 0 %
Lymphocytes Relative: 27 %
Lymphs Abs: 2.9 10*3/uL (ref 0.7–4.0)
MCH: 31.7 pg (ref 26.0–34.0)
MCHC: 33.8 g/dL (ref 30.0–36.0)
MCV: 93.9 fL (ref 80.0–100.0)
Monocytes Absolute: 1.1 10*3/uL — ABNORMAL HIGH (ref 0.1–1.0)
Monocytes Relative: 10 %
Neutro Abs: 6.6 10*3/uL (ref 1.7–7.7)
Neutrophils Relative %: 62 %
Platelets: 195 10*3/uL (ref 150–400)
RBC: 4.07 MIL/uL — ABNORMAL LOW (ref 4.22–5.81)
RDW: 12.9 % (ref 11.5–15.5)
WBC: 10.7 10*3/uL — ABNORMAL HIGH (ref 4.0–10.5)
nRBC: 0 % (ref 0.0–0.2)

## 2021-07-29 LAB — BASIC METABOLIC PANEL
Anion gap: 9 (ref 5–15)
BUN: 14 mg/dL (ref 6–20)
CO2: 27 mmol/L (ref 22–32)
Calcium: 8.7 mg/dL — ABNORMAL LOW (ref 8.9–10.3)
Chloride: 102 mmol/L (ref 98–111)
Creatinine, Ser: 1.18 mg/dL (ref 0.61–1.24)
GFR, Estimated: >60 mL/min (ref >60)
Glucose, Bld: 99 mg/dL (ref 70–99)
Potassium: 3.4 mmol/L — ABNORMAL LOW (ref 3.5–5.1)
Sodium: 138 mmol/L (ref 135–145)

## 2021-07-29 MED ORDER — AMOXICILLIN-POT CLAVULANATE 875-125 MG PO TABS: 1.0000 | ORAL_TABLET | Freq: Two times a day (BID) | ORAL | 0 refills | Status: DC

## 2021-07-29 MED ORDER — IOHEXOL 300 MG/ML  SOLN
100.0000 mL | Freq: Once | INTRAMUSCULAR | Status: AC | PRN
  Administered 2021-07-29: 100 mL via INTRAVENOUS

## 2021-07-29 MED ORDER — LIDOCAINE-EPINEPHRINE (PF) 2 %-1:200000 IJ SOLN
20.0000 mL | Freq: Once | INTRAMUSCULAR | Status: AC
  Administered 2021-07-29: 20 mL
  Filled 2021-07-29: qty 20

## 2021-07-29 MED ORDER — AMOXICILLIN-POT CLAVULANATE 875-125 MG PO TABS
1.0000 | ORAL_TABLET | Freq: Once | ORAL | Status: AC
  Administered 2021-07-29: 1 via ORAL
  Filled 2021-07-29: qty 1

## 2021-07-29 MED ORDER — FENTANYL CITRATE PF 50 MCG/ML IJ SOSY
50.0000 ug | PREFILLED_SYRINGE | Freq: Once | INTRAMUSCULAR | Status: AC
  Administered 2021-07-29: 50 ug via INTRAVENOUS
  Filled 2021-07-29: qty 1

## 2021-07-29 MED ORDER — HYDROMORPHONE HCL 1 MG/ML IJ SOLN
1.0000 mg | Freq: Once | INTRAMUSCULAR | Status: AC | PRN
  Administered 2021-07-29: 1 mg via INTRAVENOUS
  Filled 2021-07-29: qty 1

## 2021-07-29 NOTE — ED Triage Notes (Signed)
Pt reports two weeks of perirectal abscess. Pt denies drainage. Pt endorses chills beginning last night.

## 2021-07-29 NOTE — ED Provider Triage Note (Signed)
Emergency Medicine Provider Triage Evaluation Note  David [name] , a 56 y.o. male  was evaluated in triage.  Pt complains of perianal abscess. Patient states that he started noticing rectal pain about one week ago. A bump has started to enlarge throughout the week. It is painful. No drainage from the site. He has a hx of a perianal abscess that has been drained in the past. He was sent over here by urgent care.   Review of Systems  Positive:  Negative:   Physical Exam  BP (!) 145/90 (BP Location: Left Arm)    Pulse 94    Temp 99.5 F (37.5 C) (Oral)    Resp 18    Ht 5\' 11"  (1.803 m)    Wt 70.3 kg    SpO2 97%    BMI 21.62 kg/m  Gen:   Awake, no distress   Resp:  Normal effort  MSK:   Moves extremities without difficulty  Other:  Large perianal abscess with fluctuance adjacent to rectum and extending into perineum.  Medical Decision Making  Medically screening exam initiated at 6:15 PM.  Appropriate orders placed.  Marie J Kulak was informed that the remainder of the evaluation will be completed by another provider, this initial triage assessment does not replace that evaluation, and the importance of remaining in the ED until their evaluation is complete.  CT a/p to evaluate depth of tract.    , Claudie Leach 07/29/21 1816

## 2021-07-29 NOTE — Discharge Instructions (Addendum)
Go to urgent care or the emergency department to have your packing removed in 48-72 hours.  If you feel like you are getting a fever, new or worsening pain in that area, swelling, or any other new/concerning symptoms then return to the ER for evaluation.  In general, follow-up with your family doctor for abscess care.

## 2021-07-29 NOTE — ED Triage Notes (Signed)
History of the same.  Noticed abscess 3 days ago.  Reports abscess is not draining .  Reports this abscess is inside left buttocks

## 2021-07-29 NOTE — ED Provider Notes (Addendum)
[facility]    CSN: [id number] Arrival date & time: 07/29/21  1622      History   Chief Complaint Chief Complaint  Patient presents with   Abscess    HPI David [name] is a 56 y.o. male comes to the urgent care for painful swelling in the rectum.  Patient's symptoms worsened over the past 3 days.  He has been doing sitz bath's with no improvement in the pain level.  Swelling has worsened over the past 3 days.  No fever or chills.  He has not been able to move his bowel because of pain and attempting to move his bowels.  No drainage from the swelling.   HPI  Past Medical History:  Diagnosis Date   Acute ischemic left MCA stroke ([place])    a. 08/2019 MRI: Acute L MCA branch vessel infarctions; b. 08/2019 CTA head/neck: Patent bilat carotids/vertebrals. High grade stenosis - inf division of prox L MCA M2 branch. additional high-grade mid-dist superior L MCA M2 branch occlusion. 39mm aneurysm arising from cavernous L ICA.   Cardiomyopathy ([place])    a. 08/2019 Echo: EF 20-25%, global HK. Gr2 DD. Nl RV fxn. RVSP 30.71mmHg.  Mildly dil LA. Mod MR.   Cerebral aneurysm    a. 08/2019 CTA Head: 68mm aneurysm arising from the cavernous L ICA.   CHF (congestive heart failure) (HCC)    CKD stage G2/A2, GFR 60-89 and albumin creatinine ratio 30-299 mg/g    Facial mass    a. CT head: 7.0 x 2.3 cm soft tissue mass along lat aspect of L temporal bone, involving L ext ear - highly suscpicious for malignancy.   Heart attack ([place])    Heart murmur    Hypertension    Moderate mitral regurgitation    Nonischemic cardiomyopathy (HCC)    PFO (patent foramen ovale)    Stroke [facility])    Tobacco abuse     Patient Active Problem List   Diagnosis Date Noted   Chronic combined systolic and diastolic CHF (congestive heart failure) ([place]) 12/02/2019   Nonischemic cardiomyopathy ([place]) 12/02/2019   CVA (cerebral vascular accident) ([place]) 12/02/2019   PFO (patent foramen ovale) 12/02/2019   CKD stage  G2/A2, GFR 60-89 and albumin creatinine ratio 30-299 mg/g 12/02/2019   Tobacco abuse 12/02/2019   Hypotension 12/02/2019   Syncope 12/02/2019   Cryptogenic stroke ([place]) [id number]   Chronic systolic CHF (congestive heart failure) ([place]) 08/11/2019   Mass of left temporal lobe 08/11/2019   Tobacco abuse 08/10/2019   Acute ischemic stroke ([place]) 08/09/2019   Intussusception of jejunum ([place]) 09/12/2017   Tailor's bunion of both feet 01/06/2017   Onychomycosis 01/06/2017   Foot callus 01/05/2017   Foreign body in foot 01/05/2017    Past Surgical History:  Procedure Laterality Date   BUBBLE STUDY  08/12/2019   Procedure: BUBBLE STUDY;  Surgeon: Elouise Munroe, MD;  Location: [place];  Service: Cardiology;;   EXTERNAL EAR SURGERY     TEE WITHOUT CARDIOVERSION N/A 08/12/2019   Procedure: TRANSESOPHAGEAL ECHOCARDIOGRAM (TEE);  Surgeon: Elouise Munroe, MD;  Location: [place];  Service: Cardiology;  Laterality: N/A;       Home Medications    Prior to Admission medications   Medication Sig Start Date End Date Taking? Authorizing Provider  Acetaminophen (TYLENOL PO) Take by mouth.    [provider]  aspirin EC 81 MG tablet Take 1 tablet (81 mg total) by mouth daily. 11/12/19   Evans Lance,  MD  atorvastatin (LIPITOR) 80 MG tablet Take 1 tablet (80 mg total) by mouth daily at 6 PM. 02/23/21   Stanford Breed, Denice Bors, MD  carvedilol (COREG) 6.25 MG tablet Take 1 tablet (6.25 mg total) by mouth 2 (two) times daily. 02/23/21   Lelon Perla, MD  nicotine (NICODERM CQ) 21 mg/24hr patch Place 1 patch (21 mg total) onto the skin daily. 09/12/19   Maximiano Coss, NP  pantoprazole (PROTONIX) 40 MG tablet Take 1 tablet by mouth once daily Patient not taking: Reported on 07/29/2021 11/23/20   Lelon Perla, MD  sacubitril-valsartan (ENTRESTO) 24-26 MG Take 1 tablet by mouth 2 (two) times daily. 12/09/20   Deberah Pelton, NP    Family History Family History  Problem Relation  Age of Onset   Stroke Sister    Cancer Mother        died @ 91   Other Father        died of CO poisoning as a young man.    Social History Social History   Tobacco Use   Smoking status: Every Day    Packs/day: 0.25    Years: 35.00    Pack years: 8.75    Types: Cigarettes   Smokeless tobacco: Never  Vaping Use   Vaping Use: Never used  Substance Use Topics   Alcohol use: No   Drug use: Not Currently    Types: Marijuana    Comment: smokes several blunts on the weekend.     Allergies   Patient has no known allergies.   Review of Systems Review of Systems  Genitourinary: Negative.   Musculoskeletal: Negative.   Skin:  Positive for color change.  Neurological: Negative.     Physical Exam Triage Vital Signs ED Triage Vitals  Enc Vitals Group     BP 07/29/21 1703 131/81     Pulse Rate 07/29/21 1703 99     Resp 07/29/21 1703 18     Temp 07/29/21 1703 99.1 F (37.3 C)     Temp Source 07/29/21 1703 Oral     SpO2 07/29/21 1703 96 %     Weight --      Height --      Head Circumference --      Peak Flow --      Pain Score 07/29/21 1701 8     Pain Loc --      Pain Edu? --      Excl. in [place]? --    No data found.  Updated Vital Signs BP 131/81 (BP Location: Left Arm)    Pulse 99    Temp 99.1 F (37.3 C) (Oral)    Resp 18    SpO2 96%   Visual Acuity Right Eye Distance:   Left Eye Distance:   Bilateral Distance:    Right Eye Near:   Left Eye Near:    Bilateral Near:     Physical Exam Vitals and nursing note reviewed.  Constitutional:      General: He is in acute distress.  Cardiovascular:     Rate and Rhythm: Normal rate and regular rhythm.  Skin:    Comments: Exquisitely tender, fluctuant swelling in the perianal region.  Abscess measures about 3 inches in the longest diameter.  It is extending to the perineal region.  Neurological:     Mental Status: He is alert.     UC Treatments / Results  Labs (all labs ordered are listed, but only abnormal  results are  displayed) Labs Reviewed - No data to display  EKG   Radiology No results found.  Procedures Procedures (including critical care time)  Medications Ordered in UC Medications - No data to display  Initial Impression / Assessment and Plan / UC Course  I have reviewed the triage vital signs and the nursing notes.  Pertinent labs & imaging results that were available during my care of the patient were reviewed by me and considered in my medical decision making (see chart for details).     1.  Anorectal abscess: Patient is advised to go to the emergency department for further management.  The urgent care is not well resourced to address the patient's needs. Previous records from April 2016 were reviewed. Final Clinical Impressions(s) / UC Diagnoses   Final diagnoses:  Perirectal abscess     Discharge Instructions      Patient is advised to go to the emergency department given the extent of the abscess.  The emergency department is better equipped for drainage of the abscess in the perianal/perirectal area.   ED Prescriptions   None    PDMP not reviewed this encounter.   Chase Picket, MD 07/29/21 1756    Chase Picket, MD 07/29/21 [phone]

## 2021-07-29 NOTE — ED Notes (Signed)
Patient is being discharged from the Urgent Care and sent to the Emergency Department via personal vehicle . Per Dr Leonides Grills, patient is in need of higher level of care due to severe perianal abscess. Patient is aware and verbalizes understanding of plan of care.   Vitals:   07/29/21 1703  BP: 131/81  Pulse: 99  Resp: 18  Temp: 99.1 F (37.3 C)  SpO2: 96%

## 2021-07-29 NOTE — ED Provider Notes (Signed)
[facility] EMERGENCY DEPARTMENT Provider Note   CSN: [id number] Arrival date & time: 07/29/21  1753     History  Chief Complaint  Patient presents with   Abscess    David [name] is a 56 y.o. male.  HPI 56 year old male with multiple comorbidities including prior stroke, cardiomyopathy/CHF, CKD, and hypertension presents with perineal abscess.  This has been ongoing for about a week.  He had some chills last night.  He went to urgent care today and they sent him here for evaluation.  No drainage. No testicular/scrotal involvement.  He has had a buttock abscess before but nothing near this area.  Home Medications Prior to Admission medications   Medication Sig Start Date End Date Taking? Authorizing Provider  amoxicillin-clavulanate (AUGMENTIN) 875-125 MG tablet Take 1 tablet by mouth 2 (two) times daily. One po bid x 7 days 07/29/21  Yes Pricilla Loveless, MD  Acetaminophen (TYLENOL PO) Take by mouth.    [provider]  aspirin EC 81 MG tablet Take 1 tablet (81 mg total) by mouth daily. 11/12/19   Marinus Maw, MD  atorvastatin (LIPITOR) 80 MG tablet Take 1 tablet (80 mg total) by mouth daily at 6 PM. 02/23/21   [name], Madolyn Frieze, MD  carvedilol (COREG) 6.25 MG tablet Take 1 tablet (6.25 mg total) by mouth 2 (two) times daily. 02/23/21   Lewayne Bunting, MD  nicotine (NICODERM CQ) 21 mg/24hr patch Place 1 patch (21 mg total) onto the skin daily. 09/12/19   Janeece Agee, NP  pantoprazole (PROTONIX) 40 MG tablet Take 1 tablet by mouth once daily Patient not taking: Reported on 07/29/2021 11/23/20   Lewayne Bunting, MD  sacubitril-valsartan (ENTRESTO) 24-26 MG Take 1 tablet by mouth 2 (two) times daily. 12/09/20   Ronney Asters, NP      Allergies    Patient has no known allergies.    Review of Systems   Review of Systems  Constitutional:  Positive for chills. Negative for fever.  Gastrointestinal:  Negative for abdominal pain.   Physical  Exam Updated Vital Signs BP (!) 144/85 (BP Location: Right Arm)    Pulse 90    Temp 98.3 F (36.8 C) (Oral)    Resp 18    Ht 5\' 11"  (1.803 m)    Wt 70.3 kg    SpO2 97%    BMI 21.62 kg/m  Physical Exam Vitals and nursing note reviewed.  Constitutional:      Appearance: He is well-developed.  HENT:     Head: Normocephalic and atraumatic.  Pulmonary:     Effort: Pulmonary effort is normal.  Abdominal:     General: There is no distension.     Palpations: Abdomen is soft.     Tenderness: There is no abdominal tenderness.  Genitourinary:    Testes:        Right: Tenderness or swelling not present.        Left: Tenderness or swelling not present.    Skin:    General: Skin is warm and dry.  Neurological:     Mental Status: He is alert.    ED Results / Procedures / Treatments   Labs (all labs ordered are listed, but only abnormal results are displayed) Labs Reviewed  CBC WITH DIFFERENTIAL/PLATELET - Abnormal; Notable for the following components:      Result Value   WBC 10.7 (*)    RBC 4.07 (*)    Hemoglobin 12.9 (*)  HCT 38.2 (*)    Monocytes Absolute 1.1 (*)    All other components within normal limits  BASIC METABOLIC PANEL - Abnormal; Notable for the following components:   Potassium 3.4 (*)    Calcium 8.7 (*)    All other components within normal limits    EKG None  Radiology CT Abdomen Pelvis W Contrast  Result Date: 07/29/2021 CLINICAL DATA:  Vesicointestinal fistula. Perirectal abscess. Patient reports rectal pain for 1 week. Abdominal pain. EXAM: CT ABDOMEN AND PELVIS WITH CONTRAST TECHNIQUE: Multidetector CT imaging of the abdomen and pelvis was performed using the standard protocol following bolus administration of intravenous contrast. RADIATION DOSE REDUCTION: This exam was performed according to the departmental dose-optimization program which includes automated exposure control, adjustment of the mA and/or kV according to patient size and/or use of  iterative reconstruction technique. CONTRAST:  OMNIPAQUE IOHEXOL 300 MG/ML  SOLN COMPARISON:  Noncontrast CT 11/22/2019 FINDINGS: Lower chest: Triangular subpleural density in the left lower lobe, series 4, image 12. Adjacent linear subsegmental atelectasis. Hepatobiliary: No focal liver abnormality is seen. No gallstones, gallbladder wall thickening, or biliary dilatation. Pancreas: No ductal dilatation or inflammation. Spleen: Normal in size without focal abnormality. Adrenals/Urinary Tract: Normal adrenal glands. No hydronephrosis or perinephric edema. Homogeneous renal enhancement with symmetric excretion on delayed phase imaging. No evidence of focal renal abnormality. Urinary bladder is partially distended. There is no bladder wall thickening or intravesicular air to suggest fistula. Stomach/Bowel: detailed bowel assessment is limited in the absence of enteric contrast and paucity of intra-abdominal fat. There is a U shaped perineal fluid collection with both left and right lateral components. This is irregular in shape. Size estimate is difficult due to the irregularly-shaped nature, however this measures approximately 4.1 x 2.8 x 3.5 cm. Please note this is included in the periphery of the field of view, and the inferior most extent is not included. There is mild peripheral enhancement but no internal air. No well-defined connection or fistulous tract to the anorectum. Generalized fat stranding of the perineum. The stomach is decompressed. There is no evidence of small-bowel obstruction. No obvious small bowel inflammation. The distal colon is decompressed further limiting assessment. There is no obvious colonic inflammatory change. The appendix is not well-defined on the current exam. Vascular/Lymphatic: There are prominent bilateral inguinal nodes are likely reactive. This measures up to 16 mm short axis on the right in 10 mm short axis on the left. Mild aortic atherosclerosis. Patent portal, splenic  and proximal mesenteric veins. Reproductive: Prostate is unremarkable. Other: Perineal fluid collection is described. No intrapelvic fluid collection. No ascites or free air. Musculoskeletal: There are no acute or suspicious osseous abnormalities. Mild degenerative change in the spine and both hips. Remote right L1 transverse process fracture. IMPRESSION: 1. U shaped perineal abscess with both left and right lateral components. This measures approximately 4.1 x 2.8 x 3.5 cm. No well-defined connection or fistulous tract to the anorectum. Generalized fat stranding of the perineum. 2. No evidence of entero-vesical fistula. 3. Prominent bilateral inguinal nodes are likely reactive. 4. Triangular subpleural density in the left lower lobe is likely atelectasis. Aortic Atherosclerosis (ICD10-I70.0). Electronically Signed   By: Narda Rutherford M.D.   On: 07/29/2021 20:14    Procedures .Marland KitchenIncision and Drainage  Date/Time: 07/29/2021 9:31 PM Performed by: Pricilla Loveless, MD Authorized by: Pricilla Loveless, MD   Consent:    Consent obtained:  Verbal   Consent given by:  Patient   Risks, benefits, and alternatives were discussed:  yes   Universal protocol:    Patient identity confirmed:  Verbally with patient Location:    Type:  Abscess   Size:  5 cm   Location:  Anogenital   Anogenital location:  Perineum Pre-procedure details:    Skin preparation:  Povidone-iodine Sedation:    Sedation type:  None Anesthesia:    Anesthesia method:  Local infiltration   Local anesthetic:  Lidocaine 2% WITH epi Procedure type:    Complexity:  Complex Procedure details:    Incision types:  Single straight   Incision depth:  Dermal   Drainage:  Purulent   Drainage amount:  Copious   Wound treatment:  Drain placed   Packing materials:  1/4 in iodoform gauze Post-procedure details:    Procedure completion:  Tolerated well, no immediate complications    Medications Ordered in ED Medications  iohexol  (OMNIPAQUE) 300 MG/ML solution 100 mL (100 mLs Intravenous Contrast Given 07/29/21 1941)  fentaNYL (SUBLIMAZE) injection 50 mcg (50 mcg Intravenous Given 07/29/21 2024)  lidocaine-EPINEPHrine (XYLOCAINE W/EPI) 2 %-1:200000 (PF) injection 20 mL (20 mLs Infiltration Given 07/29/21 2109)  HYDROmorphone (DILAUDID) injection 1 mg (1 mg Intravenous Given 07/29/21 2107)  amoxicillin-clavulanate (AUGMENTIN) 875-125 MG per tablet 1 tablet (1 tablet Oral Given 07/29/21 2213)    ED Course/ Medical Decision Making/ A&P                           Medical Decision Making Risk Prescription drug management.   Patient presents with a perineal abscess.  He is not septic or ill-appearing.  I have reviewed the labs that were obtained and he has a mild leukocytosis but no signs of sepsis with a normal lactate and normal renal function.  No systemic symptoms.  CT abdomen/pelvis was obtained and I personally reviewed these images and there is a superficial but not obvious deep infection.  I did briefly discussed this case with general surgery, Dr. Andrey Campanile, who recommends it would be okay for an ED incision/drainage and recommends packing and following up with PCP.  Does not need emergent surgery referral given no obvious fistula and first-time occurrence in this area.  As above, the area was incised and drained in the area of maximum fluctuance.  Copious purulence came out.  It was packed.  He will be started on Augmentin given the location and given a prescription for this.  Follow-up with PCP, urgent care, or ER in 48-72 hours for packing removal and wound check.  Given return precautions.        Final Clinical Impression(s) / ED Diagnoses Final diagnoses:  Perineal abscess    Rx / DC Orders ED Discharge Orders          Ordered    amoxicillin-clavulanate (AUGMENTIN) 875-125 MG tablet  2 times daily        07/29/21 2130              Pricilla Loveless, MD 07/29/21 2239

## 2021-07-29 NOTE — Discharge Instructions (Signed)
Patient is advised to go to the emergency department given the extent of the abscess.  The emergency department is better equipped for drainage of the abscess in the perianal/perirectal area.

## 2021-08-01 ENCOUNTER — Emergency Department (HOSPITAL_COMMUNITY)
Admission: EM | Admit: 2021-08-01 | Discharge: 2021-08-01 | Disposition: A | Discharge status: Home / Self Care | Payer: 59 | Attending: Emergency Medicine | Admitting: Emergency Medicine

## 2021-08-01 ENCOUNTER — Other Ambulatory Visit: Payer: Self-pay

## 2021-08-01 ENCOUNTER — Encounter (HOSPITAL_COMMUNITY): Payer: Self-pay | Admitting: Emergency Medicine

## 2021-08-01 DIAGNOSIS — Z7982 Long term (current) use of aspirin: Secondary | ICD-10-CM | POA: Insufficient documentation

## 2021-08-01 DIAGNOSIS — I509 Heart failure, unspecified: Secondary | ICD-10-CM | POA: Insufficient documentation

## 2021-08-01 DIAGNOSIS — I130 Hypertensive heart and chronic kidney disease with heart failure and stage 1 through stage 4 chronic kidney disease, or unspecified chronic kidney disease: Secondary | ICD-10-CM | POA: Insufficient documentation

## 2021-08-01 DIAGNOSIS — Z5189 Encounter for other specified aftercare: Secondary | ICD-10-CM

## 2021-08-01 DIAGNOSIS — Z79899 Other long term (current) drug therapy: Secondary | ICD-10-CM | POA: Insufficient documentation

## 2021-08-01 DIAGNOSIS — N182 Chronic kidney disease, stage 2 (mild): Secondary | ICD-10-CM | POA: Insufficient documentation

## 2021-08-01 DIAGNOSIS — Z4801 Encounter for change or removal of surgical wound dressing: Secondary | ICD-10-CM | POA: Insufficient documentation

## 2021-08-01 NOTE — ED Provider Notes (Signed)
[facility] EMERGENCY DEPARTMENT Provider Note   CSN: [id number] Arrival date & time: 08/01/21  [id number]     History  Chief Complaint  Patient presents with   Wound Check    David [name] is a 56 y.o. male with medical history sent here for stroke, MI, heart murmur, hypertension, CKD stage II, CHF, abscess.  Patient presents to ED for wound check.  Patient had abscess drained on 2/23, had packing in place.  Patient also placed on antibiotics, Augmentin, at this time.  Patient returns to ED for wound check as directed by initial provider on 2/23.  Patient reports feeling much better than when he initially presented on 2/23.  Patient denies nausea, vomiting, fevers, body aches, chills.  Patient states that he is able to sit down now which she was unable to do before having the abscess drained.   Wound Check Pertinent negatives include no abdominal pain.      Home Medications Prior to Admission medications   Medication Sig Start Date End Date Taking? Authorizing Provider  Acetaminophen (TYLENOL PO) Take by mouth.    [provider]  amoxicillin-clavulanate (AUGMENTIN) 875-125 MG tablet Take 1 tablet by mouth 2 (two) times daily. One po bid x 7 days 07/29/21   Sherwood Gambler, MD  aspirin EC 81 MG tablet Take 1 tablet (81 mg total) by mouth daily. 11/12/19   Evans Lance, MD  atorvastatin (LIPITOR) 80 MG tablet Take 1 tablet (80 mg total) by mouth daily at 6 PM. 02/23/21   Stanford Breed, Denice Bors, MD  carvedilol (COREG) 6.25 MG tablet Take 1 tablet (6.25 mg total) by mouth 2 (two) times daily. 02/23/21   Lelon Perla, MD  nicotine (NICODERM CQ) 21 mg/24hr patch Place 1 patch (21 mg total) onto the skin daily. 09/12/19   Maximiano Coss, NP  pantoprazole (PROTONIX) 40 MG tablet Take 1 tablet by mouth once daily Patient not taking: Reported on 07/29/2021 11/23/20   Lelon Perla, MD  sacubitril-valsartan (ENTRESTO) 24-26 MG Take 1 tablet by mouth 2 (two) times daily.  12/09/20   Deberah Pelton, NP      Allergies    Patient has no known allergies.    Review of Systems   Review of Systems  Constitutional:  Negative for chills and fever.  Gastrointestinal:  Negative for abdominal pain, diarrhea, nausea and vomiting.  Musculoskeletal:  Negative for myalgias.  All other systems reviewed and are negative.  Physical Exam Updated Vital Signs BP 138/88 (BP Location: Right Arm)    Pulse 67    Temp 98.3 F (36.8 C) (Oral)    Resp 14    SpO2 100%  Physical Exam Vitals and nursing note reviewed.  Constitutional:      General: He is not in acute distress.    Appearance: He is not ill-appearing, toxic-appearing or diaphoretic.  HENT:     Head: Normocephalic and atraumatic.     Nose: Nose normal.     Mouth/Throat:     Mouth: Mucous membranes are moist.  Eyes:     Extraocular Movements: Extraocular movements intact.     Conjunctiva/sclera: Conjunctivae normal.     Pupils: Pupils are equal, round, and reactive to light.  Cardiovascular:     Rate and Rhythm: Normal rate and regular rhythm.  Pulmonary:     Effort: Pulmonary effort is normal.     Breath sounds: Normal breath sounds. No wheezing.  Abdominal:     General: Abdomen is flat.  Palpations: Abdomen is soft.     Tenderness: There is no abdominal tenderness.  Genitourinary:      Comments: 5 cm incision with packing in place, no drainage noted, no surrounding erythema. Musculoskeletal:     Cervical back: Normal range of motion and neck supple. No tenderness.  Skin:    General: Skin is warm and dry.     Capillary Refill: Capillary refill takes less than 2 seconds.  Neurological:     Mental Status: He is alert and oriented to person, place, and time.    ED Results / Procedures / Treatments   Labs (all labs ordered are listed, but only abnormal results are displayed) Labs Reviewed - No data to display  EKG None  Radiology No results found.  Procedures Procedures    Medications  Ordered in ED Medications - No data to display  ED Course/ Medical Decision Making/ A&P                           Medical Decision Making  56 year old male presents for wound check.  Patient had periapical abscess drained on 2/23, placed on antibiotics at this time.  Since procedure, patient states that he feels much better.  He denies nausea, vomiting, diarrhea, abdominal pain, chills, body aches, fevers.  Patient reports that his quality life is improved, he is able to sit down without pain.  Patient reports that he has been attempting to keep the area clean and dry following the directions provided to him by initial ED doctor on 2/23.  On examination, the patient has a 5 cm incision with packing in place.  There is no drainage noted, there is no surrounding erythema or signs of infection.  The incision appears to be healing well.  The patient denies any pain to the area.  Packing was removed, incision was visually inspected, palpated for any signs of fluctuance or drainage.  No drainage noted, no fluctuance noted.  At this time, the patient is stable for discharge.  I have discussed with the patient signs and symptoms of infection to be on the look out for and he has voiced understanding.  I have provided him with return precautions.  Patient and all of his questions answered to his satisfaction.  Patient has been advised to continue antibiotic therapy as well as keeping the area clean and dry.  Patient stable for discharge at this time  Final Clinical Impression(s) / ED Diagnoses Final diagnoses:  Wound check, abscess    Rx / DC Orders ED Discharge Orders     None         Azucena Cecil, PA-C 08/01/21 [place], MD 08/02/21 1058

## 2021-08-01 NOTE — ED Notes (Signed)
Discharged by PA prior to RN seeing patient.

## 2021-08-01 NOTE — ED Triage Notes (Signed)
Pt seen here on 2/23 for perirectal abscess.  States he was told to return in 48 hours for packing removal.  Denies pain.

## 2021-08-01 NOTE — Discharge Instructions (Signed)
Please return to ED with any new or worsening signs or symptoms such as fevers, nausea, vomiting, diarrhea, body aches or chills Please continue to keep the incision clean and dry as you have done Please continue to take antibiotics as directed

## 2021-08-04 NOTE — Progress Notes (Unsigned)
[id number] nonischemic cardiomyopathy and chronic systolic congestive heart failure.  Patient admitted March 2021 with CVA. CTA of his head and neck showed high-grade stenosis in the inferior division of the proximal left MCA M2 branch and high-grade stenosis in the mid to distal posterior left MCA M2 branch.  A 7 mm aneurysm arising from his left ICA was also noted.  An MRI showed acute left MCA branch vessel infarctions consistent with embolic stroke.  Echocardiogram at that time showed ejection fraction 20 to [id number], grade 2 diastolic dysfunction and moderate mitral regurgitation.  Patient was treated medically.  TEE showed ejection fraction 20 to 25%, patent foramen ovale.  Cardiac MRI March 2021 showed ejection fraction 20%, mild mitral and tricuspid regurgitation.  Findings felt suspicious for ischemic cardiomyopathy.  It was felt that thrombus may have been the cause of his CVA and he was anticoagulated.  Cardiac CTA March 2021 showed calcium score of 0 and no coronary disease.  Last echocardiogram February 2022 showed ejection fraction 30 to 35%, moderate left ventricular enlargement, grade 2 diastolic dysfunction, mild left atrial enlargement, mild to moderate mitral regurgitation.  Since last seen   Current Outpatient Medications  Medication Sig Dispense Refill   Acetaminophen (TYLENOL PO) Take by mouth.     amoxicillin-clavulanate (AUGMENTIN) 875-125 MG tablet Take 1 tablet by mouth 2 (two) times daily. One po bid x 7 days 13 tablet 0   aspirin EC 81 MG tablet Take 1 tablet (81 mg total) by mouth daily. 90 tablet 3   atorvastatin (LIPITOR) 80 MG tablet Take 1 tablet (80 mg total) by mouth daily at 6 PM. 90 tablet 3   carvedilol (COREG) 6.25 MG tablet Take 1 tablet (6.25 mg total) by mouth 2 (two) times daily. 180 tablet 3   nicotine (NICODERM CQ) 21 mg/24hr patch Place 1 patch (21 mg total) onto the skin daily. 28 patch 1   pantoprazole (PROTONIX) 40 MG tablet Take 1 tablet by mouth  once daily (Patient not taking: Reported on 07/29/2021) 90 tablet 1   sacubitril-valsartan (ENTRESTO) 24-26 MG Take 1 tablet by mouth 2 (two) times daily. 60 tablet 11   No current facility-administered medications for this visit.     Past Medical History:  Diagnosis Date   Acute ischemic left MCA stroke ([place])    a. 08/2019 MRI: Acute L MCA branch vessel infarctions; b. 08/2019 CTA head/neck: Patent bilat carotids/vertebrals. High grade stenosis - inf division of prox L MCA M2 branch. additional high-grade mid-dist superior L MCA M2 branch occlusion. 73mm aneurysm arising from cavernous L ICA.   Cardiomyopathy ([place])    a. 08/2019 Echo: EF 20-25%, global HK. Gr2 DD. Nl RV fxn. RVSP 30.90mmHg.  Mildly dil LA. Mod MR.   Cerebral aneurysm    a. 08/2019 CTA Head: 28mm aneurysm arising from the cavernous L ICA.   CHF (congestive heart failure) (HCC)    CKD stage G2/A2, GFR 60-89 and albumin creatinine ratio 30-299 mg/g    Facial mass    a. CT head: 7.0 x 2.3 cm soft tissue mass along lat aspect of L temporal bone, involving L ext ear - highly suscpicious for malignancy.   Heart attack ([place])    Heart murmur    Hypertension    Moderate mitral regurgitation    Nonischemic cardiomyopathy (HCC)    PFO (patent foramen ovale)    Stroke ([place])    Tobacco abuse     Past Surgical History:  Procedure Laterality Date  BUBBLE STUDY  08/12/2019   Procedure: BUBBLE STUDY;  Surgeon: Elouise Munroe, MD;  Location: [facility];  Service: Cardiology;;   EXTERNAL EAR SURGERY     TEE WITHOUT CARDIOVERSION N/A 08/12/2019   Procedure: TRANSESOPHAGEAL ECHOCARDIOGRAM (TEE);  Surgeon: Elouise Munroe, MD;  Location: [facility];  Service: Cardiology;  Laterality: N/A;    Social History   Socioeconomic History   Marital status: Single    Spouse name: Not on file   Number of children: 6   Years of education: Not on file   Highest education level: Not on file  Occupational History   Not on file  Tobacco  Use   Smoking status: Every Day    Packs/day: 0.25    Years: 35.00    Pack years: 8.75    Types: Cigarettes   Smokeless tobacco: Never  Vaping Use   Vaping Use: Never used  Substance and Sexual Activity   Alcohol use: No   Drug use: Not Currently    Types: Marijuana    Comment: smokes several blunts on the weekend.   Sexual activity: Yes  Other Topics Concern   Not on file  Social History Narrative   ** Merged History Encounter **       Lives in [place] w/ fiancee.  Does not routinely exercise but has very busy work-life, working M-F 12 hrs shifts and another 8 hrs on Saturdays.  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   Social Determinants of Health   Financial Resource Strain: Not on file  Food Insecurity: Not on file  Transportation Needs: Not on file  Physical Activity: Not on file  Stress: Not on file  Social Connections: Not on file  Intimate Partner Violence: Not on file    Family History  Problem Relation Age of Onset   Stroke Sister    Cancer Mother        died @ 77   Other Father        died of CO poisoning as a young man.    ROS: no fevers or chills, productive cough, hemoptysis, dysphasia, odynophagia, melena, hematochezia, dysuria, hematuria, rash, seizure activity, orthopnea, PND, pedal edema, claudication. Remaining systems are negative.  Physical Exam: Well-developed well-nourished in no acute distress.  Skin is warm and dry.  HEENT is normal.  Neck is supple.  Chest is clear to auscultation with normal expansion.  Cardiovascular exam is regular rate and rhythm.  Abdominal exam nontender or distended. No masses palpated. Extremities show no edema. neuro grossly intact  ECG- personally reviewed  A/P  1 nonischemic cardiomyopathy-etiology of cardiomyopathy is unclear.  His previous CTA showed no coronary disease.  We will continue medical therapy with Entresto and carvedilol.  Repeat echocardiogram to reassess LV function.  If  ejection fraction less than 35% will need to consider ICD.  2 hypertension-blood pressure controlled.  Continue present medications.  3 history of syncope-no recurrent episodes.  4 tobacco abuse-patient again counseled on discontinuing.  5 prior CVA  Kirk Ruths, MD

## 2021-08-17 ENCOUNTER — Encounter: Payer: Self-pay | Admitting: Cardiology

## 2021-08-17 ENCOUNTER — Other Ambulatory Visit: Payer: Self-pay

## 2021-08-17 ENCOUNTER — Ambulatory Visit (INDEPENDENT_AMBULATORY_CARE_PROVIDER_SITE_OTHER): Payer: 59 | Admitting: Cardiology

## 2021-08-17 VITALS — BP 134/80 | HR 60 | Ht 71.0 in | Wt 164.0 lb

## 2021-08-17 DIAGNOSIS — E785 Hyperlipidemia, unspecified: Secondary | ICD-10-CM

## 2021-08-17 DIAGNOSIS — I10 Essential (primary) hypertension: Secondary | ICD-10-CM

## 2021-08-17 DIAGNOSIS — R55 Syncope and collapse: Secondary | ICD-10-CM

## 2021-08-17 DIAGNOSIS — I420 Dilated cardiomyopathy: Secondary | ICD-10-CM

## 2021-08-17 DIAGNOSIS — Z720 Tobacco use: Secondary | ICD-10-CM

## 2021-08-17 MED ORDER — DAPAGLIFLOZIN PROPANEDIOL 10 MG PO TABS: 10.0000 mg | ORAL_TABLET | Freq: Every day | ORAL | 12 refills | Status: DC

## 2021-08-17 MED ORDER — SPIRONOLACTONE 25 MG PO TABS: 12.5000 mg | ORAL_TABLET | Freq: Every day | ORAL | 3 refills | Status: DC

## 2021-08-17 NOTE — Patient Instructions (Addendum)
Medication Instructions:  ? ?START SPIRONOLACTONE 12.5 MG ONCE DAILY= 1/2 OF THE 25 MG TABLET ONCE DAILY ? ?START FARXIGA 10 MG ONCE DAILY ? ?*If you need a refill on your cardiac medications before your next appointment, please call your pharmacy* ? ? ?Lab Work: ? ?Your physician recommends that you return for lab work in: [place]- DO NOT NEED TO FAST ? ?If you have labs (blood work) drawn today and your tests are completely normal, you will receive your results only by: ?MyChart Message (if you have MyChart) OR ?A paper copy in the mail ?If you have any lab test that is abnormal or we need to change your treatment, we will call you to review the results. ? ? ?Testing/Procedures: ? ?Your physician has requested that you have an echocardiogram. Echocardiography is a painless test that uses sound waves to create images of your heart. It provides your doctor with information about the size and shape of your heart and how well your heart?s chambers and valves are working. This procedure takes approximately one hour. There are no restrictions for this procedure. [place]Follow-Up: ?At [facility], you and your health needs are our priority.  As part of our continuing mission to provide you with exceptional heart care, we have created designated Provider Care Teams.  These Care Teams include your primary Cardiologist (physician) and Advanced Practice Providers (APPs -  Physician Assistants and Nurse Practitioners) who all work together to provide you with the care you need, when you need it. ? ?We recommend signing up for the patient portal called "MyChart".  Sign up information is provided on this After Visit Summary.  MyChart is used to connect with patients for Virtual Visits (Telemedicine).  Patients are able to view lab/test results, encounter notes, upcoming appointments, etc.  Non-urgent messages can be sent to your provider as well.   ?To learn more about what you can do with MyChart, go  to [email or web address].   ? ?Your next appointment:   ?6 month(s) ? ?The format for your next appointment:   ?In Person ? ?Provider:   ?Kirk Ruths, MD   ? ? ?

## 2021-08-25 ENCOUNTER — Other Ambulatory Visit: Payer: Self-pay

## 2021-08-25 ENCOUNTER — Ambulatory Visit (HOSPITAL_COMMUNITY): Payer: 59 | Attending: Internal Medicine

## 2021-08-25 DIAGNOSIS — I420 Dilated cardiomyopathy: Secondary | ICD-10-CM | POA: Insufficient documentation

## 2021-08-25 LAB — ECHOCARDIOGRAM COMPLETE
Area-P 1/2: 5.54 cm2
S' Lateral: 4.6 cm

## 2021-08-27 ENCOUNTER — Encounter: Payer: Self-pay | Admitting: Cardiology

## 2021-08-27 LAB — LIPID PANEL
Chol/HDL Ratio: 2.6 ratio (ref 0.0–5.0)
Cholesterol, Total: 86 mg/dL — ABNORMAL LOW (ref 100–199)
HDL: 33 mg/dL — ABNORMAL LOW (ref >39)
LDL Chol Calc (NIH): 37 mg/dL (ref 0–99)
Triglycerides: 75 mg/dL (ref 0–149)
VLDL Cholesterol Cal: 16 mg/dL (ref 5–40)

## 2021-08-27 LAB — BASIC METABOLIC PANEL
BUN/Creatinine Ratio: 17 (ref 9–20)
BUN: 18 mg/dL (ref 6–24)
CO2: 23 mmol/L (ref 20–29)
Calcium: 8.9 mg/dL (ref 8.7–10.2)
Chloride: 105 mmol/L (ref 96–106)
Creatinine, Ser: 1.03 mg/dL (ref 0.76–1.27)
Glucose: 85 mg/dL (ref 70–99)
Potassium: 4.2 mmol/L (ref 3.5–5.2)
Sodium: 141 mmol/L (ref 134–144)
eGFR: 85 mL/min/{1.73_m2} (ref >59)

## 2021-08-27 LAB — HEPATIC FUNCTION PANEL
ALT: 19 IU/L (ref 0–44)
AST: 34 IU/L (ref 0–40)
Albumin: 4.2 g/dL (ref 3.8–4.9)
Alkaline Phosphatase: 102 IU/L (ref 44–121)
Bilirubin Total: 1 mg/dL (ref 0.0–1.2)
Bilirubin, Direct: 0.24 mg/dL (ref 0.00–0.40)
Total Protein: 7.3 g/dL (ref 6.0–8.5)

## 2021-08-30 ENCOUNTER — Encounter: Payer: Self-pay | Admitting: *Deleted

## 2021-10-21 ENCOUNTER — Telehealth: Payer: Self-pay | Admitting: Cardiology

## 2021-10-21 NOTE — Telephone Encounter (Signed)
Patient calling the office for samples of medication:   1.  What medication and dosage are you requesting samples for?sacubitril-valsartan (ENTRESTO) 24-26 MG  2.  Are you currently out of this medication? Yes, fiance requests a call back after 2:30 pm because she is at work

## 2021-10-21 NOTE — Telephone Encounter (Signed)
Pt was approved for pt assistance in January. Status? Returned call to "fiance" she states that pt forgot to call manufacturer to have them send this medication and they should receive shipment on Friday. Informed her that our samples are in very short supply. Verbalized understanding. She states that pt has enough to last until Friday. She will call her pharmacy and see if they can give her a few pills just in case. She will call on Friday if they do not receive this shipment then.

## 2021-11-29 ENCOUNTER — Telehealth: Payer: Self-pay | Admitting: Cardiology

## 2021-12-01 NOTE — Telephone Encounter (Signed)
Pt returning nurse's call. Please advise

## 2021-12-01 NOTE — Telephone Encounter (Signed)
Received a call back from patient he stated since he started medications one of them is causing erectile dysfunction.Advised I will send message to our pharmacist for advice.

## 2021-12-01 NOTE — Telephone Encounter (Signed)
Spironolactone has the possibility of causing ED but is rare.  Due to patient's low EF would recommend he continue spironolactone. However he has been on medication for 3 months so unclear if his ED is medication induced.

## 2021-12-01 NOTE — Telephone Encounter (Signed)
Returned call to patient left message on personal voice mail to call back. 

## 2021-12-02 IMAGING — US US RENAL
1 series · 15 of 25 positions shown · non-contrast
Comparison: CT 11/22/2019

CLINICAL DATA: Renal mass

EXAM:
RENAL / URINARY TRACT ULTRASOUND COMPLETE

[Series 1: us renal mc & wl · 15 of 35 slices shown]
[im 1/35]
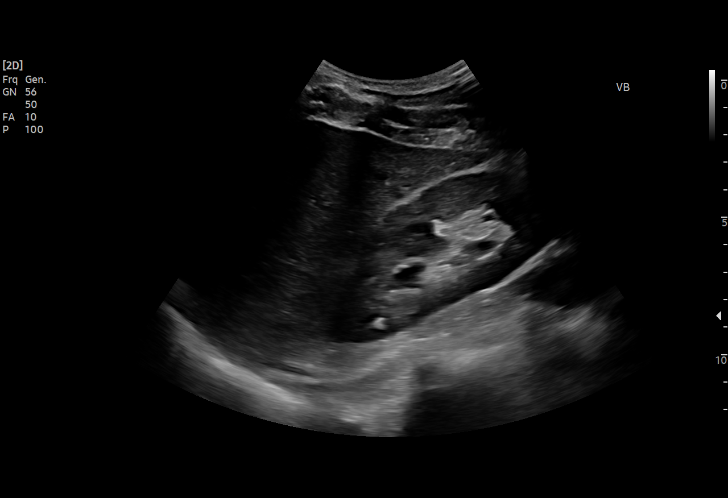
[im 3/35]
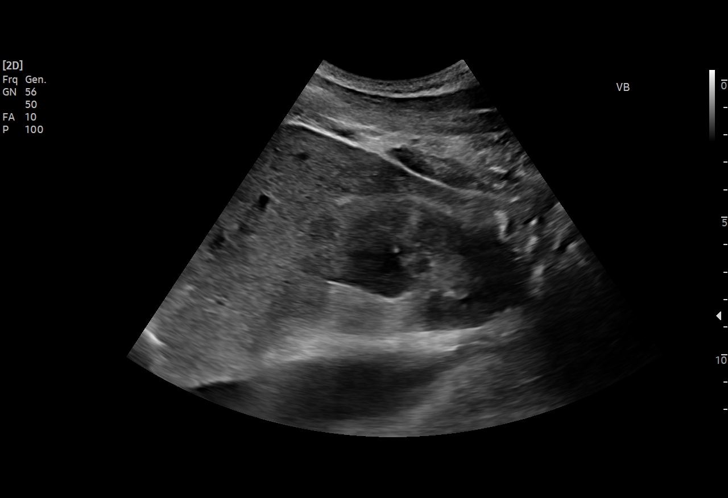
[im 6/35]
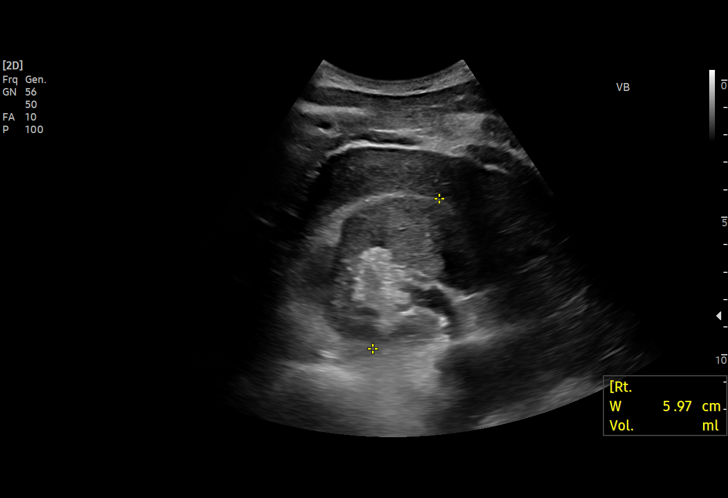
[im 8/35]
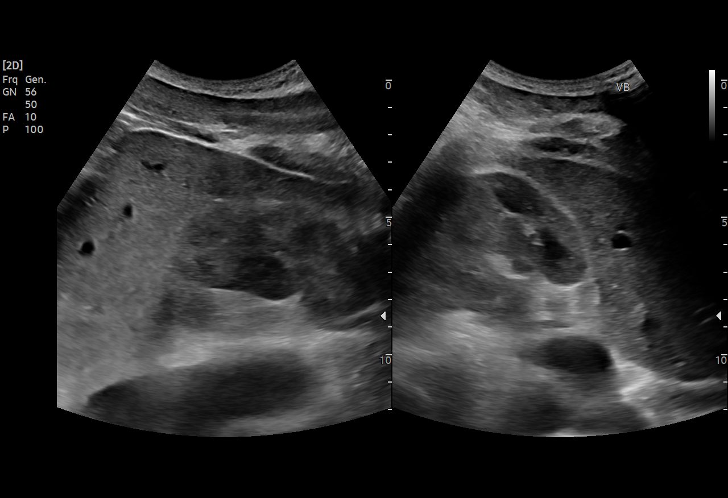
[im 10/35]
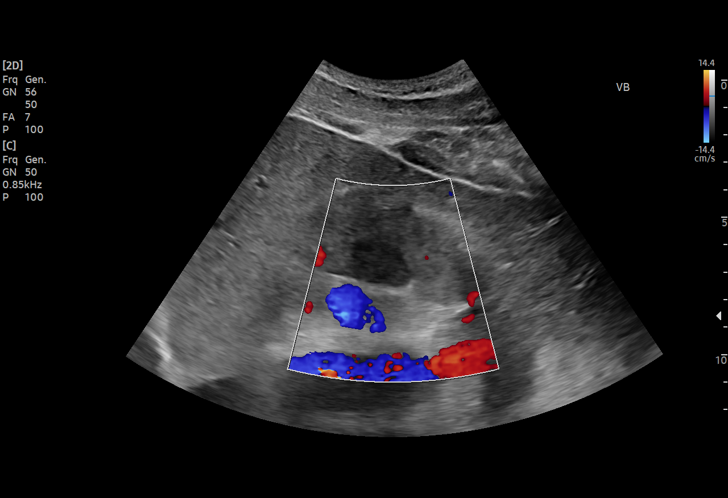
[im 13/35]
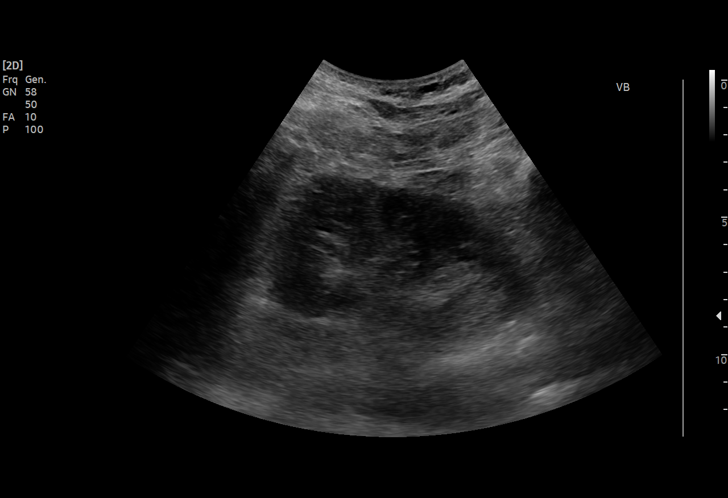
[im 15/35]
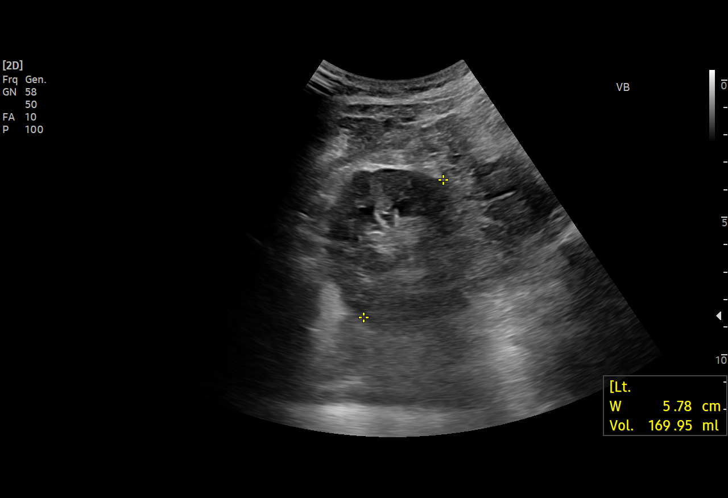
[im 18/35]
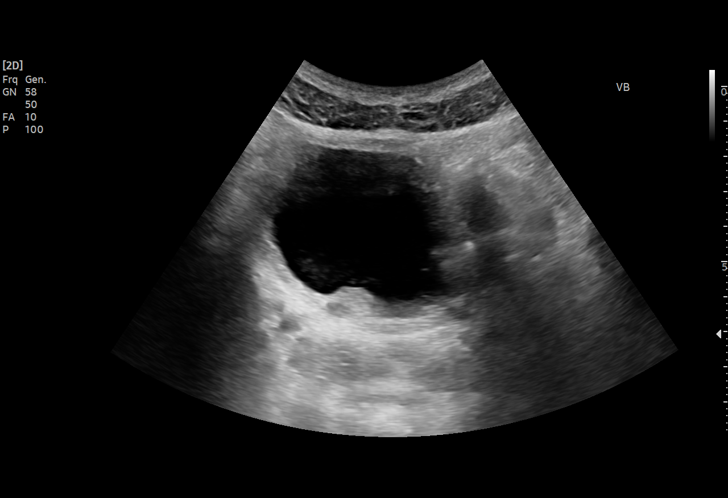
[im 20/35]
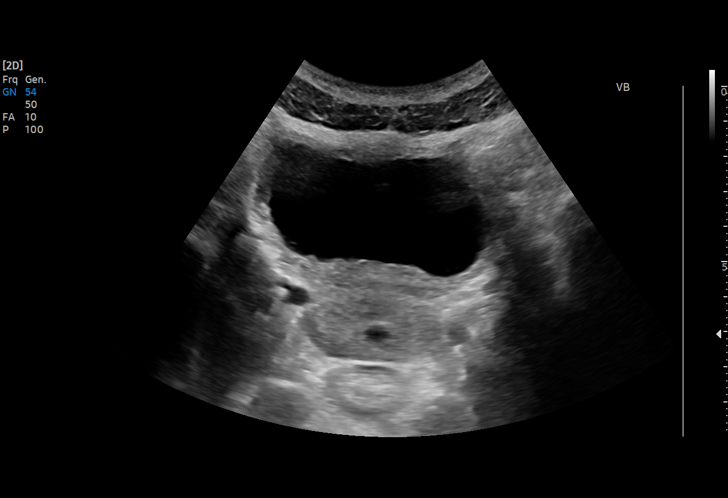
[im 22/35]
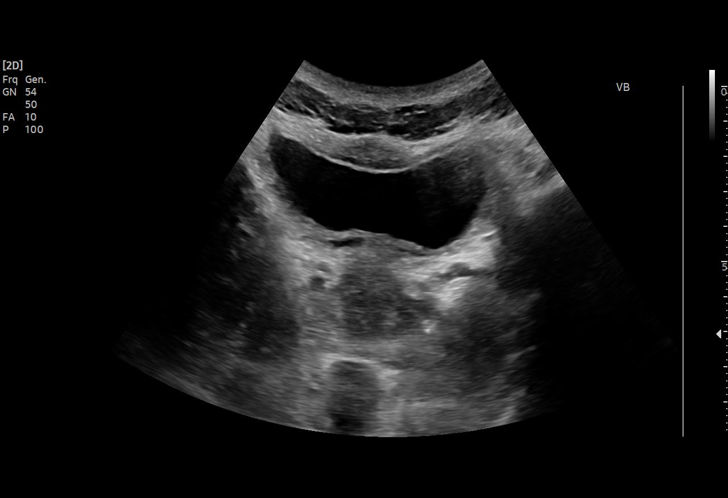
[im 25/35]
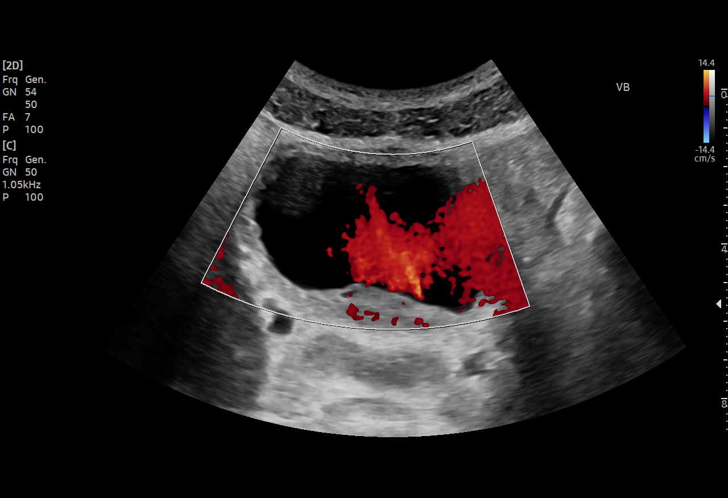
[im 27/35]
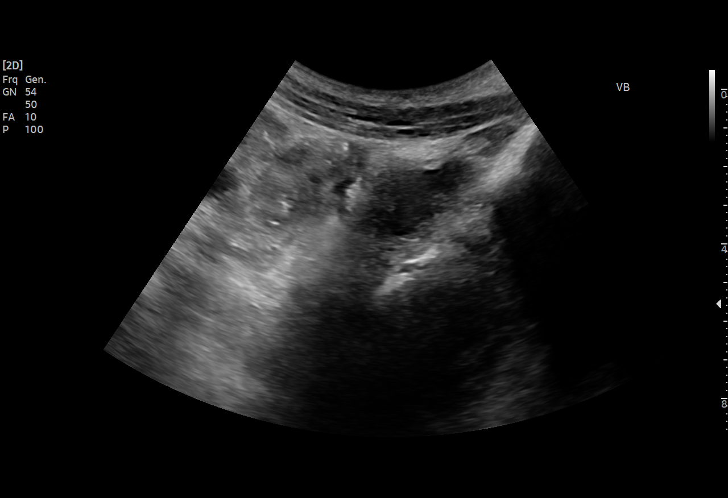
[im 29/35]
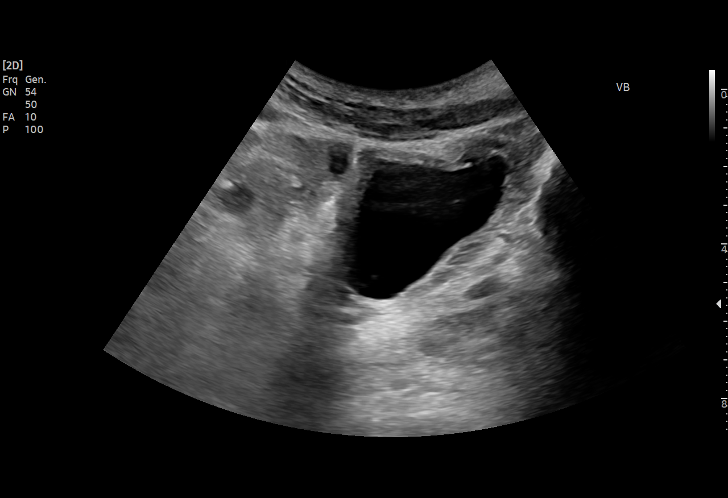
[im 32/35]
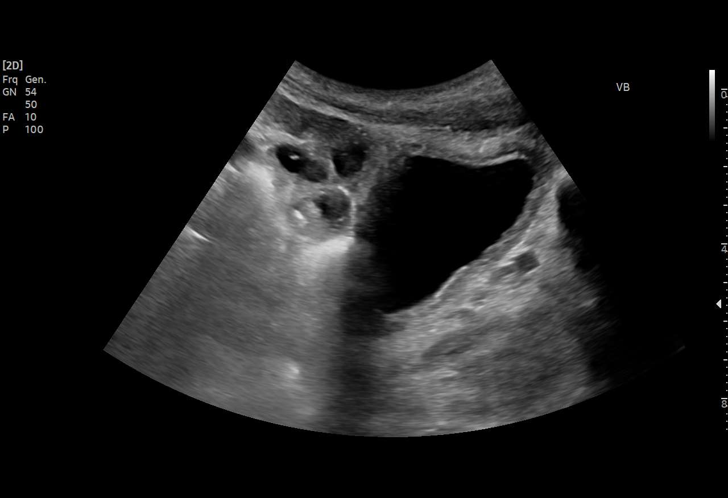
[im 35/35]
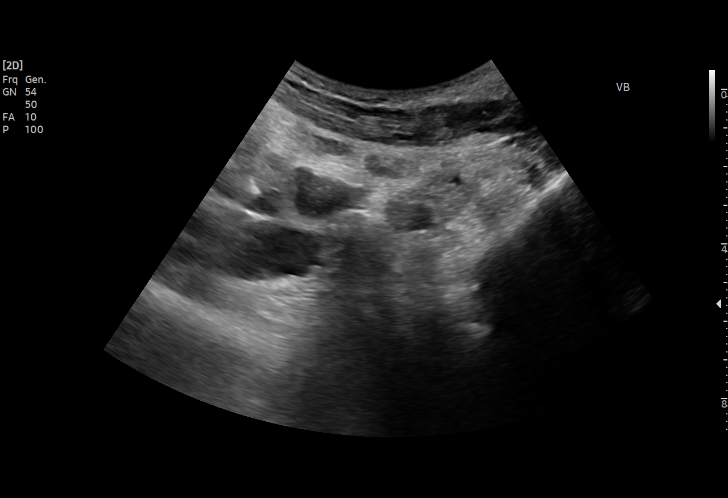

[15 of 25 positions shown; findings below may reference images not displayed]

FINDINGS: Right Kidney:

Renal measurements: 11 x 5.1 x 6 cm = volume: 174 mL. Cortical
echogenicity within normal limits. No hydronephrosis. Parapelvic
cyst measuring 2.1 x 1.6 x 1.9 cm

Left Kidney:

Renal measurements: 10.7 x 5.3 x 5.8 cm = volume: 169.9 mL.
Echogenicity within normal limits. No mass or hydronephrosis
visualized.

Bladder:

Appears normal for degree of bladder distention.

Other:

None.
IMPRESSION: 1. 2.1 cm parapelvic cyst right kidney.
2. No other discrete masses on ultrasound.

## 2021-12-03 NOTE — Telephone Encounter (Signed)
Called patient left message on personal voice mail to call back. 

## 2021-12-03 NOTE — Telephone Encounter (Signed)
Spoke to [employer].She stated patient started having ED when he started taking Spironolactone and Farxiga.Advised I will send message to [facility] for advice.

## 2021-12-06 ENCOUNTER — Emergency Department (HOSPITAL_COMMUNITY)
Admission: EM | Admit: 2021-12-06 | Discharge: 2021-12-06 | Disposition: A | Discharge status: Home / Self Care | Payer: 59 | Attending: Emergency Medicine | Admitting: Emergency Medicine

## 2021-12-06 ENCOUNTER — Encounter (HOSPITAL_COMMUNITY): Payer: Self-pay | Admitting: Pharmacy Technician

## 2021-12-06 ENCOUNTER — Other Ambulatory Visit: Payer: Self-pay

## 2021-12-06 DIAGNOSIS — Z79899 Other long term (current) drug therapy: Secondary | ICD-10-CM | POA: Insufficient documentation

## 2021-12-06 DIAGNOSIS — N529 Male erectile dysfunction, unspecified: Secondary | ICD-10-CM

## 2021-12-06 DIAGNOSIS — I110 Hypertensive heart disease with heart failure: Secondary | ICD-10-CM | POA: Diagnosis not present

## 2021-12-06 DIAGNOSIS — I509 Heart failure, unspecified: Secondary | ICD-10-CM | POA: Diagnosis not present

## 2021-12-06 DIAGNOSIS — Z7982 Long term (current) use of aspirin: Secondary | ICD-10-CM | POA: Diagnosis not present

## 2021-12-06 NOTE — ED Provider Notes (Signed)
[facility] EMERGENCY DEPARTMENT Provider Note   CSN: [id number] Arrival date & time: 12/06/21  1015     History Chief Complaint  Patient presents with   Erectile Dysfunction    David [name] is a 57 y.o. male with significant h/o CHF, NICM, MI, HTN, stroke, tobacco use presents to the ED for evaluation of his erectile dysfunction for the past week and half.  Patient is on multiple medications for his heart including atorvastatin, carvedilol, Farxiga, pantoprazole, Entresto, Aldactone.  Patient reports that he thinks that his erectile dysfunction is medication related although he has been on these medications chronically.  The newest one has been within the last 3 months. The patient has already reached out to his PCP and has a follow up appointment on 12/23/21. He has no other symptoms. The patient reports that he does not have an erection in the morning as well.    Erectile Dysfunction Pertinent negatives include no abdominal pain.       Home Medications Prior to Admission medications   Medication Sig Start Date End Date Taking? Authorizing Provider  Acetaminophen (TYLENOL PO) Take by mouth.    [provider]  aspirin EC 81 MG tablet Take 1 tablet (81 mg total) by mouth daily. 11/12/19   Marinus Maw, MD  atorvastatin (LIPITOR) 80 MG tablet Take 1 tablet (80 mg total) by mouth daily at 6 PM. 02/23/21   [name], Madolyn Frieze, MD  carvedilol (COREG) 6.25 MG tablet Take 1 tablet (6.25 mg total) by mouth 2 (two) times daily. 02/23/21   Lewayne Bunting, MD  dapagliflozin propanediol (FARXIGA) 10 MG TABS tablet Take 1 tablet (10 mg total) by mouth daily before breakfast. 08/17/21   Crenshaw, Madolyn Frieze, MD  nicotine (NICODERM CQ) 21 mg/24hr patch Place 1 patch (21 mg total) onto the skin daily. 09/12/19   Janeece Agee, NP  pantoprazole (PROTONIX) 40 MG tablet Take 1 tablet by mouth once daily 11/23/20   Lewayne Bunting, MD  sacubitril-valsartan (ENTRESTO) 24-26 MG  Take 1 tablet by mouth 2 (two) times daily. 12/09/20   Ronney Asters, NP  spironolactone (ALDACTONE) 25 MG tablet Take 0.5 tablets (12.5 mg total) by mouth daily. 08/17/21 11/15/21  Lewayne Bunting, MD      Allergies    Patient has no known allergies.    Review of Systems   Review of Systems  Constitutional:  Negative for chills and fever.  Gastrointestinal:  Negative for abdominal pain, diarrhea and vomiting.  Genitourinary:  Negative for dysuria, hematuria, penile discharge, penile pain, penile swelling, scrotal swelling and testicular pain.       Reports erectile dysfunction    Physical Exam Updated Vital Signs BP (!) 126/94 (BP Location: Right Arm)   Pulse 65   Temp 97.8 F (36.6 C)   Resp 15   SpO2 100%  Physical Exam Vitals and nursing note reviewed. Exam conducted with a chaperone present Jamey Ripa, RN).  Constitutional:      General: He is not in acute distress.    Appearance: Normal appearance. He is not toxic-appearing.  Eyes:     General: No scleral icterus. Pulmonary:     Effort: Pulmonary effort is normal. No respiratory distress.  Abdominal:     General: Bowel sounds are normal.     Palpations: Abdomen is soft.     Tenderness: There is no abdominal tenderness. There is no guarding or rebound.     Hernia: There is no hernia  in the left inguinal area or right inguinal area.  Genitourinary:    Penis: Circumcised. No tenderness, discharge or swelling.      Testes: Normal.        Right: Tenderness or swelling not present.        Left: Tenderness or swelling not present.     Epididymis:     Right: Normal.     Left: Normal.     Comments: No penile drip noted. No rash. No inguinal hernia noted. No swelling or tenderness. Circumcised.  Lymphadenopathy:     Lower Body: No right inguinal adenopathy. No left inguinal adenopathy.  Skin:    General: Skin is dry.     Findings: No rash.  Neurological:     General: No focal deficit present.     Mental Status:  He is alert. Mental status is at baseline.  Psychiatric:        Mood and Affect: Mood normal.     ED Results / Procedures / Treatments   Labs (all labs ordered are listed, but only abnormal results are displayed) Labs Reviewed - No data to display  EKG None  Radiology No results found.  Procedures Procedures   Medications Ordered in ED Medications - No data to display  ED Course/ Medical Decision Making/ A&P                           Medical Decision Making  56 year old male presents the Emergency Department for evaluation of erectile dysfunction.  Differential diagnosis includes is not limited to physiologic erectile dysfunction, psychological rectal dysfunction, medication interaction.  Vital signs show mild bradycardia at 56 otherwise normal.  Physical exam is unremarkable.  Benign GU exam.  Discussed medications with pharmacy and reports ED side effects are rare.   I discussed with the patient that this could be a medication reaction although it is vitally important that he stay on his heart medications given his significant history.  This sounds more like physiological erectile dysfunction to me as he does not have an erection in the morning as well.  The patient is already established with his primary care doctor who will see him on 12-23-2021.  I advised the patient to follow-up this appointment.  I discussed with him that the emergency department does not refill or establish a rectal dysfunction medications out of the ED.  Did discuss return precautions about symptoms.  Patient verbalized understanding and agrees to plan.  Patient stable being discharged home in good condition.  I discussed this case with my attending physician who cosigned this note including patient's presenting symptoms, physical exam, and planned diagnostics and interventions. Attending physician stated agreement with plan or made changes to plan which were implemented.   Final Clinical Impression(s) /  ED Diagnoses Final diagnoses:  Erectile dysfunction, unspecified erectile dysfunction type    Rx / DC Orders ED Discharge Orders     None         Achille Rich, PA-C 12/06/21 1141    Tanda Rockers A, DO 12/08/21 [phone]

## 2021-12-06 NOTE — Telephone Encounter (Signed)
Spoke with pt, Aware of dr crenshaw's recommendations.  °

## 2021-12-06 NOTE — Discharge Instructions (Addendum)
You were seen here for evaluation of erectile dysfunction.  Please see your primary care doctor for evaluation of this.  Please continue to take your medications as prescribed.  If you have any concern, new or worsening symptoms, please return to the nearest regimen for evaluation.  Contact a health care provider if: You feel nauseous. You are vomiting. You get sudden headaches while taking ED medicines. You have any concerns about your sexual health. Get help right away if: You are taking oral or injectable medicines and you have an erection that lasts longer than 4 hours. If your health care provider is unavailable, go to the nearest emergency room for evaluation. An erection that lasts much longer than 4 hours can result in permanent damage to your penis. You have severe pain in your groin or abdomen. You develop redness or severe swelling of your penis. You have redness spreading at your groin or lower abdomen. You are unable to urinate. You experience chest pain or a rapid heartbeat (palpitations) after taking oral medicines. These symptoms may represent a serious problem that is an emergency. Do not wait to see if the symptoms will go away. Get medical help right away. Call your local emergency services (911 in the [place] Do not drive yourself to the hospital.

## 2021-12-06 NOTE — ED Triage Notes (Signed)
Pt here with reports of being unable to obtain an erection for the last week.

## 2021-12-23 ENCOUNTER — Ambulatory Visit: Payer: 59 | Admitting: Registered Nurse

## 2022-02-08 NOTE — Progress Notes (Signed)
HPI: Follow-up nonischemic cardiomyopathy and chronic systolic congestive heart failure.  Patient admitted March 2021 with CVA. CTA of his head and neck showed high-grade stenosis in the inferior division of the proximal left MCA M2 branch and high-grade stenosis in the mid to distal posterior left MCA M2 branch.  A 7 mm aneurysm arising from his left ICA was also noted.  An MRI showed acute left MCA branch vessel infarctions consistent with embolic stroke.  Echocardiogram at that time showed ejection fraction 20 to [id number], grade 2 diastolic dysfunction and moderate mitral regurgitation.  Patient was treated medically.  TEE showed ejection fraction 20 to 25%, patent foramen ovale.  Cardiac MRI March 2021 showed ejection fraction 20%, mild mitral and tricuspid regurgitation.  Findings felt suspicious for ischemic cardiomyopathy.  It was felt that thrombus may have been the cause of his CVA and he was anticoagulated.  Cardiac CTA March 2021 showed calcium score of 0 and no coronary disease.  Last echocardiogram March 2023 showed ejection fraction 45 to 50%, mild left ventricular enlargement, grade 1 diastolic dysfunction, mild mitral regurgitation.  Since last seen he denies dyspnea, chest pain, palpitations or syncope.  No pedal edema.  He did not tolerate Farxiga or spironolactone due to side effects.  Current Outpatient Medications  Medication Sig Dispense Refill   Acetaminophen (TYLENOL PO) Take by mouth.     aspirin EC 81 MG tablet Take 1 tablet (81 mg total) by mouth daily. 90 tablet 3   atorvastatin (LIPITOR) 80 MG tablet Take 1 tablet (80 mg total) by mouth daily at 6 PM. 90 tablet 3   carvedilol (COREG) 6.25 MG tablet Take 1 tablet (6.25 mg total) by mouth 2 (two) times daily. 180 tablet 3   dapagliflozin propanediol (FARXIGA) 10 MG TABS tablet Take 1 tablet (10 mg total) by mouth daily before breakfast. 30 tablet 12   nicotine (NICODERM CQ) 21 mg/24hr patch Place 1 patch (21 mg total) onto the  skin daily. 28 patch 1   pantoprazole (PROTONIX) 40 MG tablet Take 1 tablet by mouth once daily 90 tablet 1   sacubitril-valsartan (ENTRESTO) 24-26 MG Take 1 tablet by mouth 2 (two) times daily. 60 tablet 11   spironolactone (ALDACTONE) 25 MG tablet Take 0.5 tablets (12.5 mg total) by mouth daily. 45 tablet 3   No current facility-administered medications for this visit.     Past Medical History:  Diagnosis Date   Acute ischemic left MCA stroke ([place])    a. 08/2019 MRI: Acute L MCA branch vessel infarctions; b. 08/2019 CTA head/neck: Patent bilat carotids/vertebrals. High grade stenosis - inf division of prox L MCA M2 branch. additional high-grade mid-dist superior L MCA M2 branch occlusion. 13mm aneurysm arising from cavernous L ICA.   Cardiomyopathy ([place])    a. 08/2019 Echo: EF 20-25%, global HK. Gr2 DD. Nl RV fxn. RVSP 30.74mmHg.  Mildly dil LA. Mod MR.   Cerebral aneurysm    a. 08/2019 CTA Head: 41mm aneurysm arising from the cavernous L ICA.   CHF (congestive heart failure) (HCC)    CKD stage G2/A2, GFR 60-89 and albumin creatinine ratio 30-299 mg/g    Facial mass    a. CT head: 7.0 x 2.3 cm soft tissue mass along lat aspect of L temporal bone, involving L ext ear - highly suscpicious for malignancy.   Heart attack ([place])    Heart murmur    Hypertension    Moderate mitral regurgitation    Nonischemic cardiomyopathy ([place])  PFO (patent foramen ovale)    Stroke [facility])    Tobacco abuse     Past Surgical History:  Procedure Laterality Date   BUBBLE STUDY  08/12/2019   Procedure: BUBBLE STUDY;  Surgeon: Parke Poisson, MD;  Location: [facility] ENDOSCOPY;  Service: Cardiology;;   EXTERNAL EAR SURGERY     TEE WITHOUT CARDIOVERSION N/A 08/12/2019   Procedure: TRANSESOPHAGEAL ECHOCARDIOGRAM (TEE);  Surgeon: Parke Poisson, MD;  Location: [facility] ENDOSCOPY;  Service: Cardiology;  Laterality: N/A;    Social History   Socioeconomic History   Marital status: Single    Spouse name: Not on file    Number of children: 6   Years of education: Not on file   Highest education level: Not on file  Occupational History   Not on file  Tobacco Use   Smoking status: Every Day    Packs/day: 0.25    Years: 35.00    Total pack years: 8.75    Types: Cigarettes   Smokeless tobacco: Never  Vaping Use   Vaping Use: Never used  Substance and Sexual Activity   Alcohol use: No   Drug use: Not Currently    Types: Marijuana    Comment: smokes several blunts on the weekend.   Sexual activity: Yes  Other Topics Concern   Not on file  Social History Narrative   ** Merged History Encounter **       Lives in [place] w/ fiancee.  Does not routinely exercise but has very busy work-life, working M-F 12 hrs shifts and another 8 hrs on Saturdays.  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   Social Determinants of Health   Financial Resource Strain: Not on file  Food Insecurity: Not on file  Transportation Needs: Not on file  Physical Activity: Not on file  Stress: Not on file  Social Connections: Not on file  Intimate Partner Violence: Not on file    Family History  Problem Relation Age of Onset   Stroke Sister    Cancer Mother        died @ 75   Other Father        died of CO poisoning as a young man.    ROS: no fevers or chills, productive cough, hemoptysis, dysphasia, odynophagia, melena, hematochezia, dysuria, hematuria, rash, seizure activity, orthopnea, PND, pedal edema, claudication. Remaining systems are negative.  Physical Exam: Well-developed well-nourished in no acute distress.  Skin is warm and dry.  HEENT is normal.  Neck is supple.  Chest is clear to auscultation with normal expansion.  Cardiovascular exam is regular rate and rhythm.  Abdominal exam nontender or distended. No masses palpated. Extremities show no edema. neuro grossly intact  ECG-sinus rhythm at a rate of 63, inferolateral T wave inversion.  Inferior T wave inversion noted on tracing  August 17, 2021.  Personally reviewed  A/P  1 nonischemic cardiomyopathy-LV function with some improvement on most recent echocardiogram.  Continue Entresto, carvedilol.  He did not tolerate Farxiga or spironolactone.  We will likely repeat his echocardiogram when he returns in 6 months.  2 hypertension-blood pressure controlled.  Continue present medical regimen and follow.  3 history of syncope-previously felt to be orthostatic mediated.  No recurrences.  4 tobacco abuse-patient counseled on discontinuing.  5 prior CVA-continue aspirin and statin.    6 history of aneurysm in the cavernous left internal carotid artery-we will arrange follow-up with Dr. Corliss Skains interventional radiology as recommended at time of event  in March 2021.  7 hyperlipidemia-continue statin.  8 history of left temporal mass-follow-up ENT as needed.  Olga Millers, MD

## 2022-02-17 ENCOUNTER — Encounter: Payer: Self-pay | Admitting: Cardiology

## 2022-02-17 ENCOUNTER — Ambulatory Visit: Payer: 59 | Attending: Cardiology | Admitting: Cardiology

## 2022-02-17 VITALS — BP 108/74 | HR 63 | Ht 71.0 in | Wt 159.0 lb

## 2022-02-17 DIAGNOSIS — I10 Essential (primary) hypertension: Secondary | ICD-10-CM | POA: Diagnosis not present

## 2022-02-17 DIAGNOSIS — Z720 Tobacco use: Secondary | ICD-10-CM

## 2022-02-17 DIAGNOSIS — E785 Hyperlipidemia, unspecified: Secondary | ICD-10-CM | POA: Diagnosis not present

## 2022-02-17 DIAGNOSIS — I420 Dilated cardiomyopathy: Secondary | ICD-10-CM | POA: Diagnosis not present

## 2022-02-17 NOTE — Patient Instructions (Signed)
  Follow-Up: At [facility], you and your health needs are our priority.  As part of our continuing mission to provide you with exceptional heart care, we have created designated Provider Care Teams.  These Care Teams include your primary Cardiologist (physician) and Advanced Practice Providers (APPs -  Physician Assistants and Nurse Practitioners) who all work together to provide you with the care you need, when you need it.  We recommend signing up for the patient portal called "MyChart".  Sign up information is provided on this After Visit Summary.  MyChart is used to connect with patients for Virtual Visits (Telemedicine).  Patients are able to view lab/test results, encounter notes, upcoming appointments, etc.  Non-urgent messages can be sent to your provider as well.   To learn more about what you can do with MyChart, go to [email or web address].    Your next appointment:   6 month(s)  The format for your next appointment:   In Person  Provider:   Olga Millers, MD     Other Instructions  MAKE A FOLLOW UP APPOINTMENT WITH DR Simonne Maffucci DEVESHWAR=[phone]  FOLLOW UP WITH ENT JEFF ROSEN= [phone]

## 2022-03-14 ENCOUNTER — Other Ambulatory Visit: Payer: Self-pay | Admitting: Cardiology

## 2022-03-14 DIAGNOSIS — I420 Dilated cardiomyopathy: Secondary | ICD-10-CM

## 2022-03-15 ENCOUNTER — Other Ambulatory Visit (HOSPITAL_COMMUNITY): Payer: Self-pay | Admitting: Interventional Radiology

## 2022-03-15 DIAGNOSIS — I671 Cerebral aneurysm, nonruptured: Secondary | ICD-10-CM

## 2022-04-07 IMAGING — CT CT ABD-PELV W/ CM
2 of 5 series · 15 of 46 positions shown, 17 images · IV contrast (Omni 300)
Comparison: Noncontrast CT 11/22/2019

CLINICAL DATA: Vesicointestinal fistula. Perirectal abscess.
Patient reports rectal pain for 1 week. Abdominal pain.

EXAM:
CT ABDOMEN AND PELVIS WITH CONTRAST
TECHNIQUE: Multidetector CT imaging of the abdomen and pelvis was performed
using the standard protocol following bolus administration of
intravenous contrast.

[Series 3: a/p w/ 5mm · axial · 0.81mm/px · z∈[+576,+1006]mm · 12 of 96 slices shown, 14 images]
[im 5/96  soft-tissue]
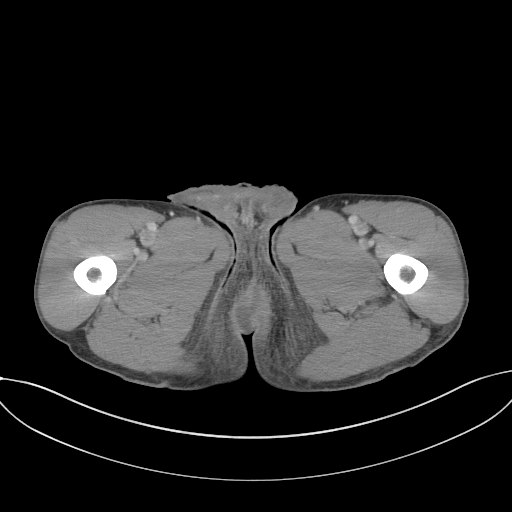
[im 5/96  bone]
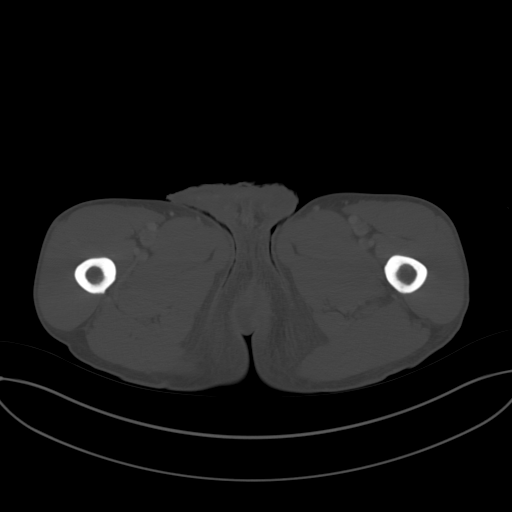
[im 15/96  soft-tissue]
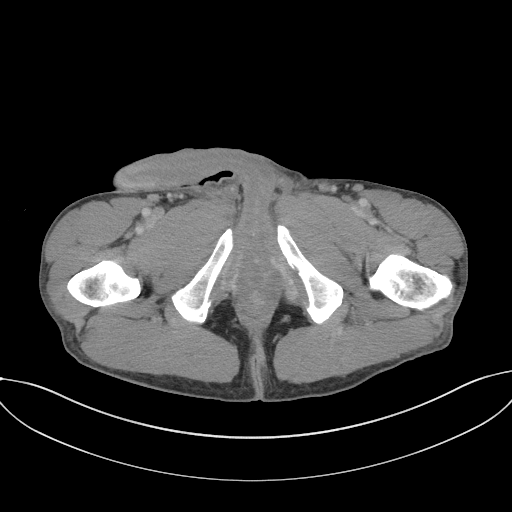
[im 20/96  soft-tissue]
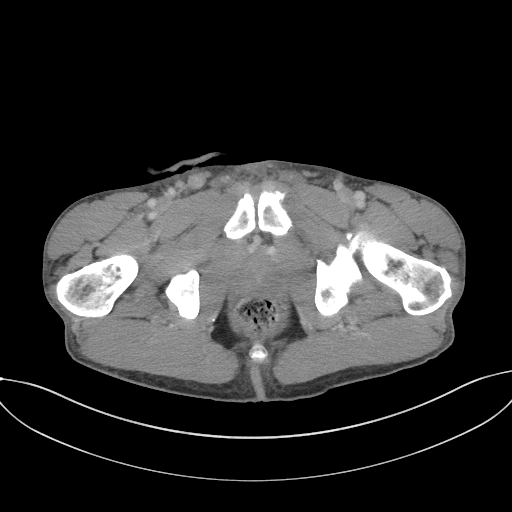
[im 29/96  soft-tissue]
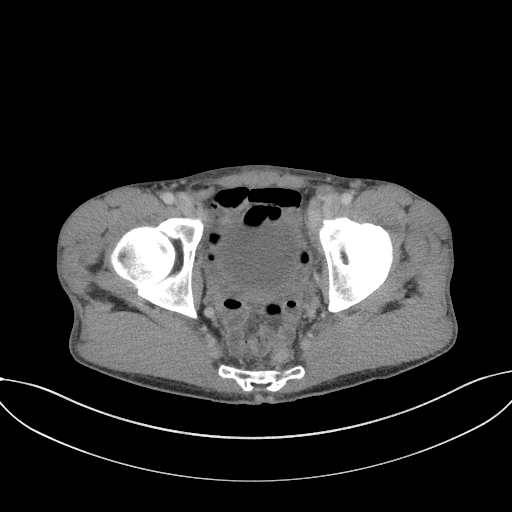
[im 39/96  soft-tissue]
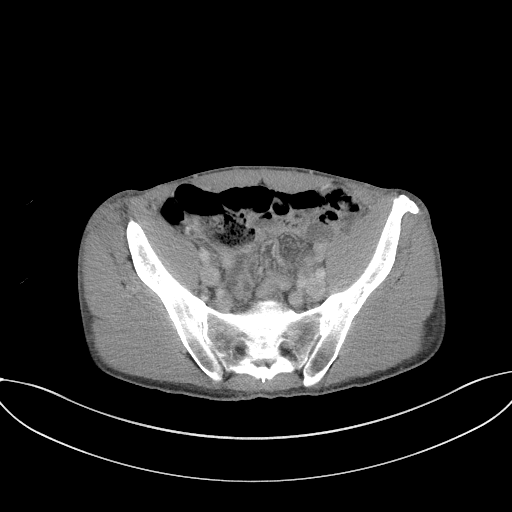
[im 43/96  soft-tissue]
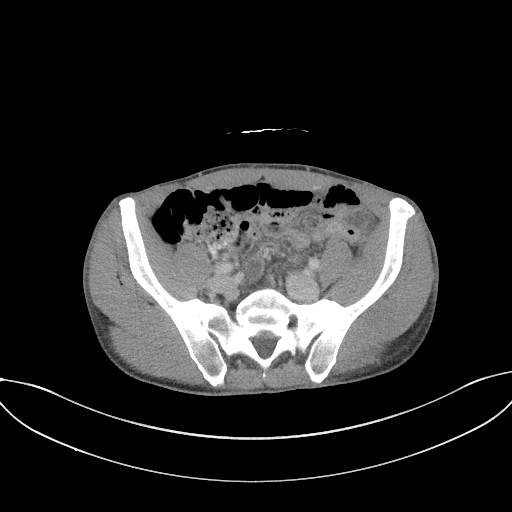
[im 53/96  soft-tissue]
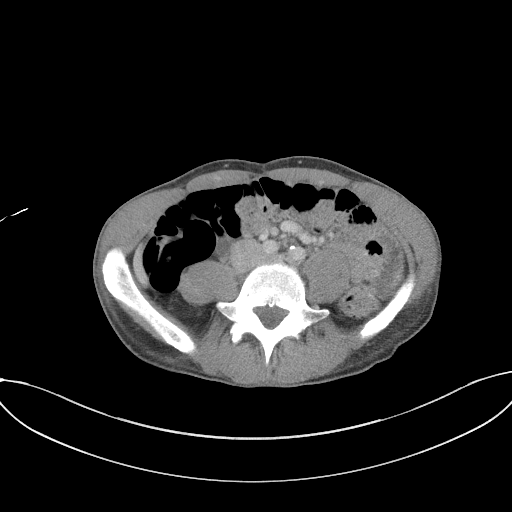
[im 58/96  soft-tissue]
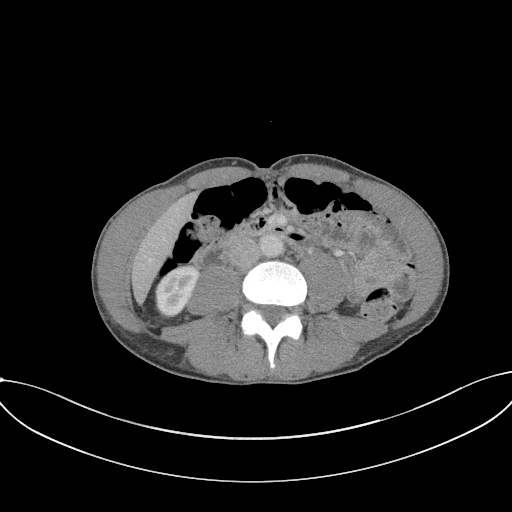
[im 67/96  soft-tissue]
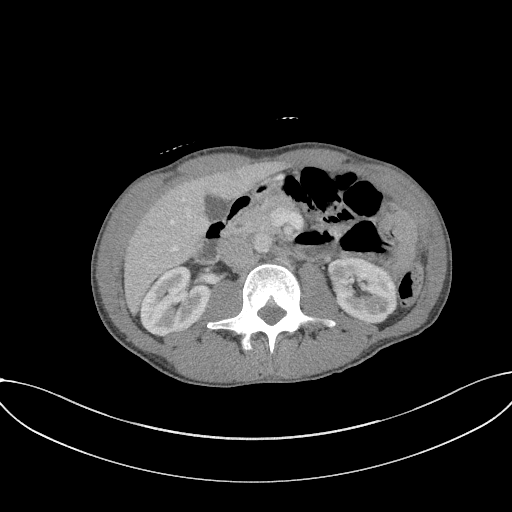
[im 67/96  bone]
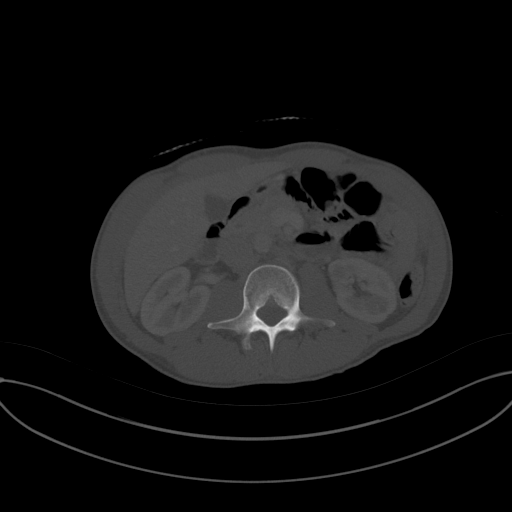
[im 77/96  soft-tissue]
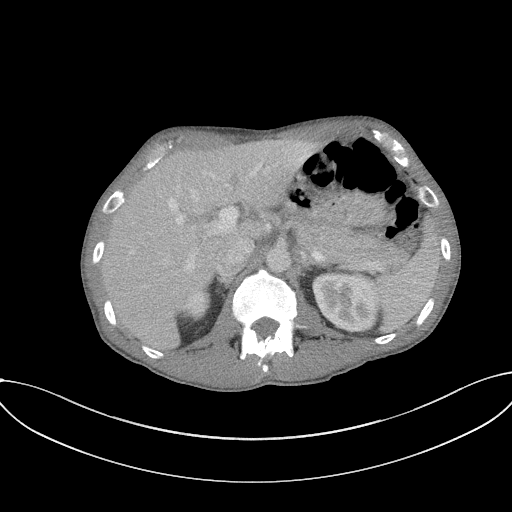
[im 81/96  soft-tissue]
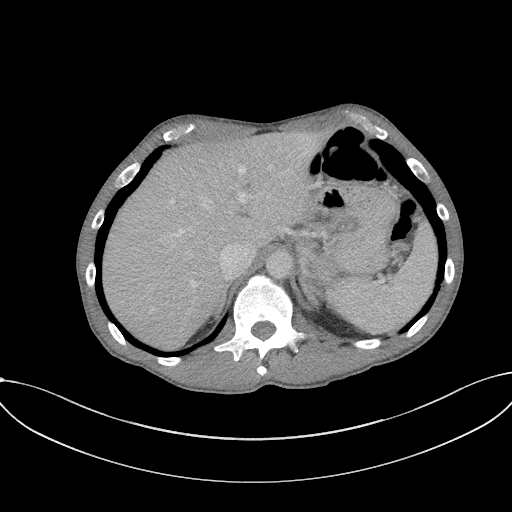
[im 91/96  soft-tissue]
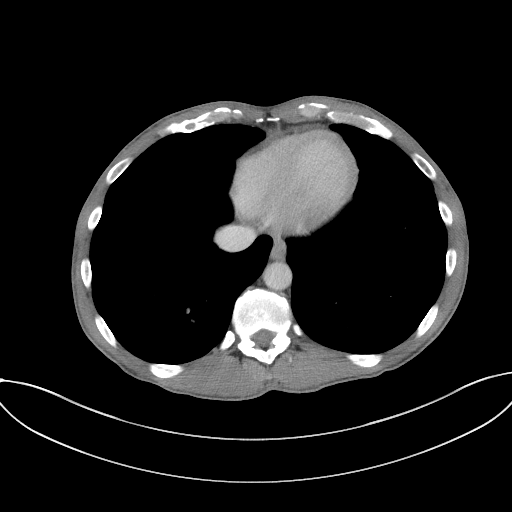

[Series 6: a/p w/ cor · coronal · 0.76mm/px · 3 of 128 slices shown]
[im 43/128  soft-tissue]
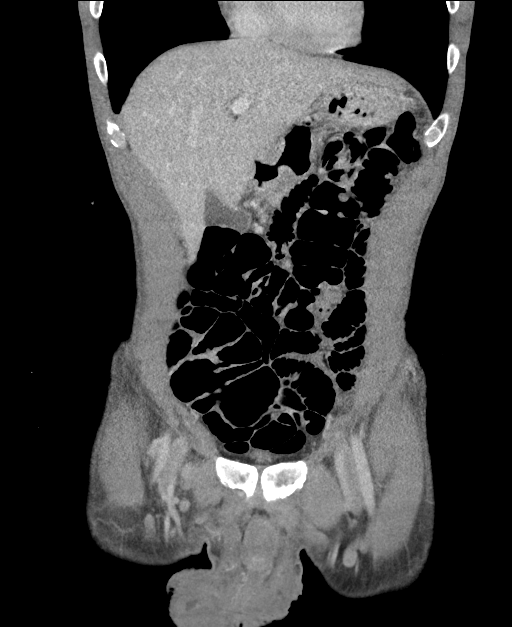
[im 57/128  soft-tissue]
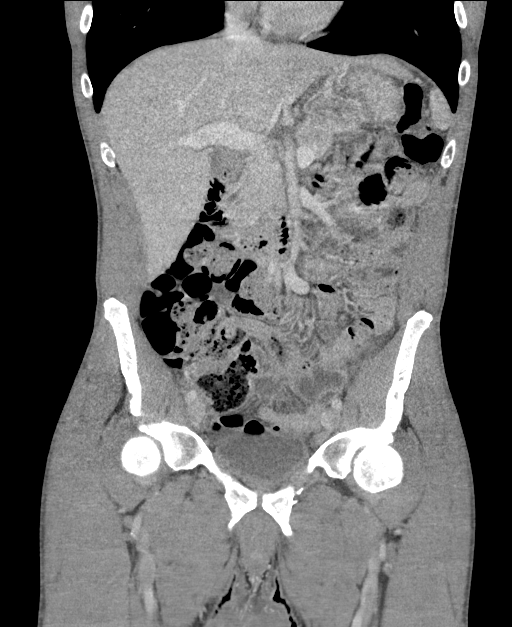
[im 71/128  soft-tissue]
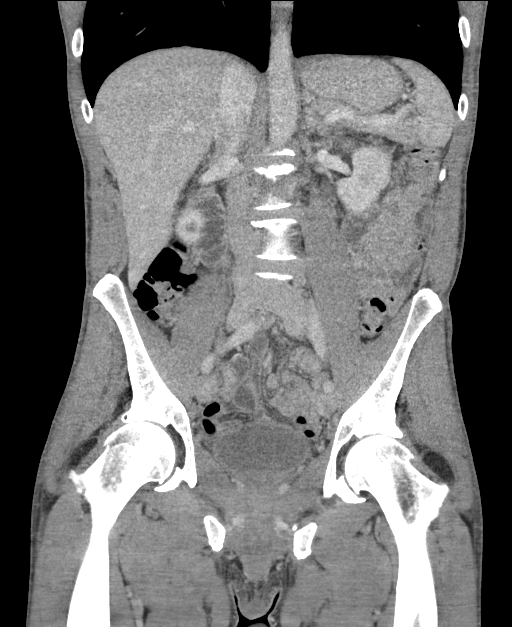

[15 of 46 positions shown; findings below may reference images not displayed]

RADIATION DOSE REDUCTION: This exam was performed according to the
departmental dose-optimization program which includes automated
exposure control, adjustment of the mA and/or kV according to
patient size and/or use of iterative reconstruction technique.

CONTRAST:  100mL OMNIPAQUE IOHEXOL 300 MG/ML  SOLN
FINDINGS: Lower chest: Triangular subpleural density in the left lower lobe,
series 4, image 12. Adjacent linear subsegmental atelectasis.

Hepatobiliary: No focal liver abnormality is seen. No gallstones,
gallbladder wall thickening, or biliary dilatation.

Pancreas: No ductal dilatation or inflammation.

Spleen: Normal in size without focal abnormality.

Adrenals/Urinary Tract: Normal adrenal glands. No hydronephrosis or
perinephric edema. Homogeneous renal enhancement with symmetric
excretion on delayed phase imaging. No evidence of focal renal
abnormality.

Urinary bladder is partially distended. There is no bladder wall
thickening or intravesicular air to suggest fistula.

Stomach/Bowel: detailed bowel assessment is limited in the absence
of enteric contrast and paucity of intra-abdominal fat. There is a U
shaped perineal fluid collection with both left and right lateral
components. This is irregular in shape. Size estimate is difficult
due to the irregularly-shaped nature, however this measures
approximately 4.1 x 2.8 x 3.5 cm. Please note this is included in
the periphery of the field of view, and the inferior most extent is
not included. There is mild peripheral enhancement but no internal
air. No well-defined connection or fistulous tract to the anorectum.
Generalized fat stranding of the perineum.

The stomach is decompressed. There is no evidence of small-bowel
obstruction. No obvious small bowel inflammation. The distal colon
is decompressed further limiting assessment. There is no obvious
colonic inflammatory change. The appendix is not well-defined on the
current exam.

Vascular/Lymphatic: There are prominent bilateral inguinal nodes are
likely reactive. This measures up to 16 mm short axis on the right
in 10 mm short axis on the left. Mild aortic atherosclerosis. Patent
portal, splenic and proximal mesenteric veins.

Reproductive: Prostate is unremarkable.

Other: Perineal fluid collection is described. No intrapelvic fluid
collection. No ascites or free air.

Musculoskeletal: There are no acute or suspicious osseous
abnormalities. Mild degenerative change in the spine and both hips.
Remote right L1 transverse process fracture.
IMPRESSION: 1. U shaped perineal abscess with both left and right lateral
components. This measures approximately 4.1 x 2.8 x 3.5 cm. No
well-defined connection or fistulous tract to the anorectum.
Generalized fat stranding of the perineum.
2. No evidence of entero-vesical fistula.
3. Prominent bilateral inguinal nodes are likely reactive.
4. Triangular subpleural density in the left lower lobe is likely
atelectasis.

Aortic Atherosclerosis ([id number].S).

## 2022-04-18 ENCOUNTER — Telehealth: Payer: Self-pay | Admitting: Cardiology

## 2022-04-18 NOTE — Telephone Encounter (Signed)
Patient's wife called and would like to talk with Dr. [name] or nurse in regards to medication Entresto.

## 2022-04-18 NOTE — Telephone Encounter (Signed)
Returned call to patient's wife (okay per DPR) who states that patient is out of Sherryll Burger and it is time for his patient assistance to be renewed. Advised her that one bottle of Entresto 24/26mg   is at the front desk for pick up and to drop off the application for assistance and we will get it filled out and faxed over for patient. Patients wife verbalized understanding.

## 2022-05-05 ENCOUNTER — Telehealth: Payer: Self-pay | Admitting: Cardiology

## 2022-05-05 NOTE — Telephone Encounter (Signed)
Pt c/o medication issue:  1. Name of Medication:   sacubitril-valsartan (ENTRESTO) 24-26 MG    2. How are you currently taking this medication (dosage and times per day)? Take 1 tablet by mouth 2 (two) times daily. - Oral   3. Are you having a reaction (difficulty breathing--STAT)?   4. What is your medication issue? Pt's partner calling for an update on patient assistance form for [facility]

## 2022-05-05 NOTE — Telephone Encounter (Signed)
Spoke with David [name], aware waiting on proof of income to be able to send application in. Message was left for the patient. She will get him to bring the information by the office.

## 2022-05-09 NOTE — Telephone Encounter (Signed)
Spoke with significant other to inform her entresto 24-26 has been set aside for patient. She state she is on her way to [place] to drop off patient assistance papers. Lot #: [id number]; Exp 4/26.

## 2022-05-09 NOTE — Telephone Encounter (Signed)
  [name] calling back, she said pt is also need samples of entresto since pt is out of medication

## 2022-05-11 NOTE — Telephone Encounter (Signed)
Entresto patinet assistance application faxed to the company.

## 2022-05-23 ENCOUNTER — Encounter: Payer: Self-pay | Admitting: Gastroenterology

## 2022-06-07 ENCOUNTER — Other Ambulatory Visit (INDEPENDENT_AMBULATORY_CARE_PROVIDER_SITE_OTHER): Payer: 59

## 2022-06-07 ENCOUNTER — Encounter: Payer: Self-pay | Admitting: Gastroenterology

## 2022-06-07 ENCOUNTER — Ambulatory Visit (INDEPENDENT_AMBULATORY_CARE_PROVIDER_SITE_OTHER): Payer: 59 | Admitting: Gastroenterology

## 2022-06-07 VITALS — BP 128/80 | HR 62 | Ht 71.0 in | Wt 177.6 lb

## 2022-06-07 DIAGNOSIS — Z9189 Other specified personal risk factors, not elsewhere classified: Secondary | ICD-10-CM

## 2022-06-07 DIAGNOSIS — D649 Anemia, unspecified: Secondary | ICD-10-CM | POA: Diagnosis not present

## 2022-06-07 DIAGNOSIS — K219 Gastro-esophageal reflux disease without esophagitis: Secondary | ICD-10-CM

## 2022-06-07 DIAGNOSIS — Z1211 Encounter for screening for malignant neoplasm of colon: Secondary | ICD-10-CM

## 2022-06-07 DIAGNOSIS — Z8673 Personal history of transient ischemic attack (TIA), and cerebral infarction without residual deficits: Secondary | ICD-10-CM

## 2022-06-07 LAB — CBC WITH DIFFERENTIAL/PLATELET
Basophils Absolute: 0 10*3/uL (ref 0.0–0.1)
Basophils Relative: 0.5 % (ref 0.0–3.0)
Eosinophils Absolute: 0.6 10*3/uL (ref 0.0–0.7)
Eosinophils Relative: 8 % — ABNORMAL HIGH (ref 0.0–5.0)
HCT: 41.1 % (ref 39.0–52.0)
Hemoglobin: 13.7 g/dL (ref 13.0–17.0)
Lymphocytes Relative: 43.4 % (ref 12.0–46.0)
Lymphs Abs: 3 10*3/uL (ref 0.7–4.0)
MCHC: 33.4 g/dL (ref 30.0–36.0)
MCV: 97.1 fl (ref 78.0–100.0)
Monocytes Absolute: 0.6 10*3/uL (ref 0.1–1.0)
Monocytes Relative: 7.9 % (ref 3.0–12.0)
Neutro Abs: 2.8 10*3/uL (ref 1.4–7.7)
Neutrophils Relative %: 40.2 % — ABNORMAL LOW (ref 43.0–77.0)
Platelets: 230 10*3/uL (ref 150.0–400.0)
RBC: 4.23 Mil/uL (ref 4.22–5.81)
RDW: 13.3 % (ref 11.5–15.5)
WBC: 7 10*3/uL (ref 4.0–10.5)

## 2022-06-07 MED ORDER — FAMOTIDINE 40 MG PO TABS
40.0000 mg | ORAL_TABLET | Freq: Two times a day (BID) | ORAL | 3 refills | Status: AC | PRN
Start: 2022-06-07 — End: ?

## 2022-06-07 NOTE — Progress Notes (Unsigned)
HPI : David [name] is a pleasant 57 year old male with a history of nonischemic cardiomyopathy (EF 20 to-25% 2021, more recently EF 45-50% March 2023), CVA in 2021 with high-grade stenosis of the left MCA and aneurysm of left ICA who is referred to [place] by Maximiano Coss, NP for initial average risk screening colonoscopy. The patient has never had any previous colon cancer screening.  He has no known family history of colon cancer.  He has no concerning chronic lower GI symptoms other than occasional diarrhea.  He denies any blood in the stool.  No problems with abdominal pain or constipation. He does have frequent heartburn and acid regurgitation.  He has this several days per week.  He has been bothered by the symptoms for about a year now.  Symptoms only bother him during the day and do not awaken him from sleep or interfere with sleep.  He is he is more likely to have the symptoms based on what he eats (heavy, fried foods, spicy foods).  He denies symptoms of dysphagia.  He symptoms have nausea and rare vomiting when his reflux symptoms are severe.  His appetite is good and his weight is stable.  He has a history of recurrent superficial perianal abscess requiring bedside incision and drainage.  He has not required surgery for this in the past.  He denies any recent symptoms of perianal pain, swelling, warmth or drainage.  He has not had any recurrence of any strokelike symptoms since his stroke in 2021.  He takes an aspirin daily.  He was not found to have any significant coronary artery disease on a CTA in 2021. He has been referred to interventional radiology on 2 occasions for further evaluation/management of his ICA aneurysm.  He states that they have been unable to make an appointment.  He denies any symptoms of chest pain/pressure, shortness of breath, orthopnea or lower extremity swelling.  No episodes of lightheadedness/dizziness or syncope.       Past Medical History:  Diagnosis  Date   Acute ischemic left MCA stroke ([place])    a. 08/2019 MRI: Acute L MCA branch vessel infarctions; b. 08/2019 CTA head/neck: Patent bilat carotids/vertebrals. High grade stenosis - inf division of prox L MCA M2 branch. additional high-grade mid-dist superior L MCA M2 branch occlusion. 92mm aneurysm arising from cavernous L ICA.   Cardiomyopathy ([place])    a. 08/2019 Echo: EF 20-25%, global HK. Gr2 DD. Nl RV fxn. RVSP 30.66mmHg.  Mildly dil LA. Mod MR.   Cerebral aneurysm    a. 08/2019 CTA Head: 2mm aneurysm arising from the cavernous L ICA.   CHF (congestive heart failure) (HCC)    CKD stage G2/A2, GFR 60-89 and albumin creatinine ratio 30-299 mg/g    Facial mass    a. CT head: 7.0 x 2.3 cm soft tissue mass along lat aspect of L temporal bone, involving L ext ear - highly suscpicious for malignancy.   Heart attack ([place])    Heart murmur    Hypertension    Moderate mitral regurgitation    Nonischemic cardiomyopathy (HCC)    PFO (patent foramen ovale)    Stroke ([place])    Tobacco abuse      Past Surgical History:  Procedure Laterality Date   BUBBLE STUDY  08/12/2019   Procedure: BUBBLE STUDY;  Surgeon: Elouise Munroe, MD;  Location: [place];  Service: Cardiology;;   EXTERNAL EAR SURGERY     TEE WITHOUT CARDIOVERSION N/A 08/12/2019   Procedure: TRANSESOPHAGEAL  ECHOCARDIOGRAM (TEE);  Surgeon: Elouise Munroe, MD;  Location: [place];  Service: Cardiology;  Laterality: N/A;   Family History  Problem Relation Age of Onset   Stroke Sister    Cancer Mother        died @ 67   Other Father        died of CO poisoning as a young man.   Social History   Tobacco Use   Smoking status: Every Day    Packs/day: 0.25    Years: 35.00    Total pack years: 8.75    Types: Cigarettes   Smokeless tobacco: Never  Vaping Use   Vaping Use: Never used  Substance Use Topics   Alcohol use: No   Drug use: Yes    Types: Marijuana    Comment: smokes several blunts on the weekend.   Current  Outpatient Medications  Medication Sig Dispense Refill   Acetaminophen (TYLENOL PO) Take by mouth.     aspirin EC 81 MG tablet Take 1 tablet (81 mg total) by mouth daily. 90 tablet 3   atorvastatin (LIPITOR) 80 MG tablet TAKE 1 TABLET BY MOUTH ONCE DAILY AT 6 PM. 30 tablet 11   carvedilol (COREG) 6.25 MG tablet Take 1 tablet by mouth twice daily 60 tablet 11   sacubitril-valsartan (ENTRESTO) 24-26 MG Take 1 tablet by mouth 2 (two) times daily. 60 tablet 11   dapagliflozin propanediol (FARXIGA) 10 MG TABS tablet Take 1 tablet (10 mg total) by mouth daily before breakfast. (Patient not taking: Reported on 06/07/2022) 30 tablet 12   No current facility-administered medications for this visit.   No Known Allergies   Review of Systems: All systems reviewed and negative except where noted in HPI.    No results found.  Physical Exam: BP 128/80   Pulse 62   Ht 5\' 11"  (1.803 m)   Wt 177 lb 9 oz (80.5 kg)   BMI 24.76 kg/m  Constitutional: Pleasant,well-developed, African-American male in no acute distress.  Accompanied by male companion HEENT: Normocephalic and atraumatic. Conjunctivae are normal. No scleral icterus.  Multiple missing teeth.  Bandage covering L ear, not removed.  Patient reports chronic problems with drainage from left ear and has history of surgery in the area.  Is followed by ENT Neck supple.  Cardiovascular: Normal rate, regular rhythm.  Pulmonary/chest: Effort normal and breath sounds normal. No wheezing, rales or rhonchi. Abdominal: Soft, nondistended, nontender. Bowel sounds active throughout. There are no masses palpable. No hepatomegaly. Extremities: no edema Neurological: Alert and oriented to person place and time. Skin: Skin is warm and dry. No rashes noted. Psychiatric: Normal mood and affect. Behavior is normal.  CBC    Component Value Date/Time   WBC 10.7 (H) 07/29/2021 1822   RBC 4.07 (L) 07/29/2021 1822   HGB 12.9 (L) 07/29/2021 1822   HGB 12.7 (L)  12/16/2019 1637   HCT 38.2 (L) 07/29/2021 1822   HCT 38.0 12/16/2019 1637   PLT 195 07/29/2021 1822   PLT 231 12/16/2019 1637   MCV 93.9 07/29/2021 1822   MCV 96 12/16/2019 1637   MCH 31.7 07/29/2021 1822   MCHC 33.8 07/29/2021 1822   RDW 12.9 07/29/2021 1822   RDW 12.3 12/16/2019 1637   LYMPHSABS 2.9 07/29/2021 1822   LYMPHSABS 3.0 12/16/2019 1637   MONOABS 1.1 (H) 07/29/2021 1822   EOSABS 0.1 07/29/2021 1822   EOSABS 0.4 12/16/2019 1637   BASOSABS 0.0 07/29/2021 1822   BASOSABS 0.0 12/16/2019 1637  CMP     Component Value Date/Time   NA 141 08/27/2021 0837   K 4.2 08/27/2021 0837   CL 105 08/27/2021 0837   CO2 23 08/27/2021 0837   GLUCOSE 85 08/27/2021 0837   GLUCOSE 99 07/29/2021 1822   BUN 18 08/27/2021 0837   CREATININE 1.03 08/27/2021 0837   CALCIUM 8.9 08/27/2021 0837   PROT 7.3 08/27/2021 0837   ALBUMIN 4.2 08/27/2021 0837   AST 34 08/27/2021 0837   ALT 19 08/27/2021 0837   ALKPHOS 102 08/27/2021 0837   BILITOT 1.0 08/27/2021 0837   GFRNONAA >60 07/29/2021 1822   GFRAA 75 01/24/2020 1605     ASSESSMENT AND PLAN: 57 year old male with history of stroke in the setting of high-grade stenosis of multiple branches of the MCA and a 7 mm aneurysm of the left ICA, with history of nonischemic cardiomyopathy with significant improvement in his ejection fraction over the past 2 years, overdue for initial after screening colonoscopy.  He has no concerning lower GI symptoms and no family history of colon cancer.  He was noted to have mild anemia when he presented to the emergency department in February with a perianal abscess.  I would like to recheck his hemoglobin to see if this anemia has been persistent. He does have history of frequent typical GERD symptoms for the past year.  He does not take any medications for this.  I recommended he start taking Pepcid as needed for symptoms. Will schedule patient for initial colonoscopy.  Given his high risk of stroke and history  of cardiomyopathy, I think his procedure would be best performed in the hospital setting.  We will put him on a wait list for hospital-based endoscopy procedures and contact him when we have availability. Will repeat CBC.  If hemoglobin is persistently low, will get iron panel and an upper endoscopy may also be warranted.  CRC screening -Colonoscopy in hospital setting  GERD -Pepcid 40 mg PO q12hr PRN  Mild anemia -Repeat CBC -Iron panel if anemia persistent  The details, risks (including bleeding, perforation, infection, missed lesions, medication reactions and possible hospitalization or surgery if complications occur), benefits, and alternatives to colonoscopy with possible biopsy and possible polypectomy were discussed with the patient and he consents to proceed.   Laresa Oshiro E. Tomasa Rand, MD [facility]   CC:  Janeece Agee, NP

## 2022-06-07 NOTE — Patient Instructions (Addendum)
_______________________________________________________  If you are age 59 or older, your body mass index should be between 23-30. Your Body mass index is 24.76 kg/m. If this is out of the aforementioned range listed, please consider follow up with your Primary Care Provider.  If you are age 23 or younger, your body mass index should be between 19-25. Your Body mass index is 24.76 kg/m. If this is out of the aformentioned range listed, please consider follow up with your Primary Care Provider.   Your provider has requested that you go to the basement level for lab work before leaving today. Press "B" on the elevator. The lab is located at the first door on the left as you exit the elevator.  We will call you to schedule Colonoscopy at the hospital .  We have sent the following medications to your pharmacy for you to pick up at your convenience: Pecid 40 mg   The [facility] providers would like to encourage you to use [facility] to communicate with providers for non-urgent requests or questions.  Due to long hold times on the telephone, sending your provider a message by [facility] may be a faster and more efficient way to get a response.  Please allow 48 business hours for a response.  Please remember that this is for non-urgent requests.   It was a pleasure to see you today!  Thank you for trusting me with your gastrointestinal care!

## 2022-08-10 NOTE — Progress Notes (Deleted)
HPI: Follow-up nonischemic cardiomyopathy and chronic systolic congestive heart failure.  Patient admitted March 2021 with CVA. CTA of his head and neck showed high-grade stenosis in the inferior division of the proximal left MCA M2 branch and high-grade stenosis in the mid to distal posterior left MCA M2 branch.  A 7 mm aneurysm arising from his left ICA was also noted.  An MRI showed acute left MCA branch vessel infarctions consistent with embolic stroke.  Echocardiogram at that time showed ejection fraction 20 to [id number], grade 2 diastolic dysfunction and moderate mitral regurgitation.  Patient was treated medically.  TEE showed ejection fraction 20 to 25%, patent foramen ovale.  Cardiac MRI March 2021 showed ejection fraction 20%, mild mitral and tricuspid regurgitation.  Findings felt suspicious for ischemic cardiomyopathy.  It was felt that thrombus may have been the cause of his CVA and he was anticoagulated.  Cardiac CTA March 2021 showed calcium score of 0 and no coronary disease.  Last echocardiogram March 2023 showed ejection fraction 45 to 50%, mild left ventricular enlargement, grade 1 diastolic dysfunction, mild mitral regurgitation.  Since last seen   Current Outpatient Medications  Medication Sig Dispense Refill   Acetaminophen (TYLENOL PO) Take by mouth.     aspirin EC 81 MG tablet Take 1 tablet (81 mg total) by mouth daily. 90 tablet 3   atorvastatin (LIPITOR) 80 MG tablet TAKE 1 TABLET BY MOUTH ONCE DAILY AT 6 PM. 30 tablet 11   carvedilol (COREG) 6.25 MG tablet Take 1 tablet by mouth twice daily 60 tablet 11   dapagliflozin propanediol (FARXIGA) 10 MG TABS tablet Take 1 tablet (10 mg total) by mouth daily before breakfast. (Patient not taking: Reported on 06/07/2022) 30 tablet 12   famotidine (PEPCID) 40 MG tablet Take 1 tablet (40 mg total) by mouth every 12 (twelve) hours as needed for heartburn or indigestion. 30 tablet 3   sacubitril-valsartan (ENTRESTO) 24-26 MG Take 1 tablet by  mouth 2 (two) times daily. 60 tablet 11   No current facility-administered medications for this visit.     Past Medical History:  Diagnosis Date   Acute ischemic left MCA stroke ([place])    a. 08/2019 MRI: Acute L MCA branch vessel infarctions; b. 08/2019 CTA head/neck: Patent bilat carotids/vertebrals. High grade stenosis - inf division of prox L MCA M2 branch. additional high-grade mid-dist superior L MCA M2 branch occlusion. 55m aneurysm arising from cavernous L ICA.   [place]omyopathy ([facility]    a. 08/2019 Echo: EF 20-25%, global HK. Gr2 DD. Nl RV fxn. RVSP 30.2837mg.  Mildly dil LA. Mod MR.   Cerebral aneurysm    a. 08/2019 CTA Head: 37m55mneurysm arising from the cavernous L ICA.   CHF (congestive heart failure) (HCC)    CKD stage G2/A2, GFR 60-89 and albumin creatinine ratio 30-299 mg/g    Facial mass    a. CT head: 7.0 x 2.3 cm soft tissue mass along lat aspect of L temporal bone, involving L ext ear - highly suscpicious for malignancy.   Heart attack ([facility]  Heart murmur    Hypertension    Moderate mitral regurgitation    Nonischemic cardiomyopathy (HCC)    PFO (patent foramen ovale)    Stroke ([facility]  Tobacco abuse     Past Surgical History:  Procedure Laterality Date   BUBBLE STUDY  08/12/2019   Procedure: BUBBLE STUDY;  Surgeon: AchElouise MunroeD;  [facility];;  EXTERNAL EAR SURGERY     TEE WITHOUT CARDIOVERSION N/A 08/12/2019   Procedure: TRANSESOPHAGEAL ECHOCARDIOGRAM (TEE);  Surgeon: Elouise Munroe, MD;  Location: [place];  Service: Cardiology;  Laterality: N/A;    Social History   Socioeconomic History   Marital status: Single    Spouse name: Not on file   Number of children: 6   Years of education: Not on file   Highest education level: Not on file  Occupational History   Not on file  Tobacco Use   Smoking status: Every Day    Packs/day: 0.25    Years: 35.00    Total pack years: 8.75    Types: Cigarettes    Smokeless tobacco: Never  Vaping Use   Vaping Use: Never used  Substance and Sexual Activity   Alcohol use: No   Drug use: Yes    Types: Marijuana    Comment: smokes several blunts on the weekend.   Sexual activity: Yes  Other Topics Concern   Not on file  Social History Narrative   ** Merged History Encounter **       Lives in [place] w/ fiancee.  Does not routinely exercise but has very busy work-life, working M-F 12 hrs shifts and another 8 hrs on Saturdays.  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   Social Determinants of Health   Financial Resource Strain: Not on file  Food Insecurity: Not on file  Transportation Needs: Not on file  Physical Activity: Not on file  Stress: Not on file  Social Connections: Not on file  Intimate Partner Violence: Not on file    Family History  Problem Relation Age of Onset   Stroke Sister    Cancer Mother        died @ 43   Other Father        died of CO poisoning as a young man.    ROS: no fevers or chills, productive cough, hemoptysis, dysphasia, odynophagia, melena, hematochezia, dysuria, hematuria, rash, seizure activity, orthopnea, PND, pedal edema, claudication. Remaining systems are negative.  Physical Exam: Well-developed well-nourished in no acute distress.  Skin is warm and dry.  HEENT is normal.  Neck is supple.  Chest is clear to auscultation with normal expansion.  Cardiovascular exam is regular rate and rhythm.  Abdominal exam nontender or distended. No masses palpated. Extremities show no edema. neuro grossly intact  ECG- personally reviewed  A/P  1 nonischemic cardiomyopathy-plan to continue Entresto and carvedilol.  Patient did not tolerate Farxiga or spironolactone.  Will plan to repeat echocardiogram to reassess LV function.  2 hypertension-patient's blood pressure is controlled.  Continue present medications and follow-up.  3 hyperlipidemia-continue statin.  4 history of syncope-this  was felt previously to be orthostatic mediated.  Patient has had no recurrences since last office visit.  5 tobacco abuse-patient counseled on discontinuing.  6 history of CVA-continue aspirin and statin.  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 Cyndie Chime wire of interventional radiology as recommended.  8 history of temporal mass-I have also asked him to follow-up with ENT concerning this issue.  Kirk Ruths, MD

## 2022-08-18 ENCOUNTER — Ambulatory Visit: Payer: 59 | Admitting: Cardiology

## 2022-10-12 NOTE — Progress Notes (Signed)
HPI: Follow-up nonischemic cardiomyopathy and chronic systolic congestive heart failure.  Patient admitted March 2021 with CVA. CTA of his head and neck showed high-grade stenosis in the inferior division of the proximal left MCA M2 branch and high-grade stenosis in the mid to distal posterior left MCA M2 branch.  A 7 mm aneurysm arising from his left ICA was also noted.  An MRI showed acute left MCA branch vessel infarctions consistent with embolic stroke.  Echocardiogram at that time showed ejection fraction 20 to 25%, grade 2 diastolic dysfunction and moderate mitral regurgitation.  Patient was treated medically.  TEE showed ejection fraction 20 to 25%, patent foramen ovale.  Cardiac MRI March 2021 showed ejection fraction 20%, mild mitral and tricuspid regurgitation.  Findings felt suspicious for ischemic cardiomyopathy.  It was felt that thrombus may have been the cause of his CVA and he was anticoagulated.  Cardiac CTA March 2021 showed calcium score of 0 and no coronary disease.  Last echocardiogram March 2023 showed ejection fraction 45 to 50%, mild left ventricular enlargement, grade 1 diastolic dysfunction, mild mitral regurgitation.  Since last seen patient denies dyspnea, chest pain, palpitations or syncope.  Some dizziness occasionally in the evenings.  Current Outpatient Medications  Medication Sig Dispense Refill   Acetaminophen (TYLENOL PO) Take by mouth.     aspirin EC 81 MG tablet Take 1 tablet (81 mg total) by mouth daily. 90 tablet 3   atorvastatin (LIPITOR) 80 MG tablet TAKE 1 TABLET BY MOUTH ONCE DAILY AT 6 PM. 30 tablet 11   carvedilol (COREG) 6.25 MG tablet Take 1 tablet by mouth twice daily 60 tablet 11   famotidine (PEPCID) 40 MG tablet Take 1 tablet (40 mg total) by mouth every 12 (twelve) hours as needed for heartburn or indigestion. 30 tablet 3   sacubitril-valsartan (ENTRESTO) 24-26 MG Take 1 tablet by mouth 2 (two) times daily. 60 tablet 11   No current  facility-administered medications for this visit.     Past Medical History:  Diagnosis Date   Acute ischemic left MCA stroke ([facility])    a. 08/2019 MRI: Acute L MCA branch vessel infarctions; b. 08/2019 CTA head/neck: Patent bilat carotids/vertebrals. High grade stenosis - inf division of prox L MCA M2 branch. additional high-grade mid-dist superior L MCA M2 branch occlusion. 7mm aneurysm arising from cavernous L ICA.   Cardiomyopathy ([facility]CC)    a. 08/2019 Echo: EF 20-25%, global HK. Gr2 DD. Nl RV fxn. RVSP 30.93mmHg.  Mildly dil LA. Mod MR.   Cerebral aneurysm    a. 08/2019 CTA Head: 7mm aneurysm arising from the cavernous L ICA.   CHF (congestive heart failure) (HCC)    CKD stage G2/A2, GFR 60-89 and albumin creatinine ratio 30-299 mg/g    Facial mass    a. CT head: 7.0 x 2.3 cm soft tissue mass along lat aspect of L temporal bone, involving L ext ear - highly suscpicious for malignancy.   Heart attack (HCC)    Heart murmur    Hypertension    Moderate mitral regurgitation    Nonischemic cardiomyopathy (HCC)    PFO (patent foramen ovale)    Stroke (HCC)    Tobacco abuse     Past Surgical History:  Procedure Laterality Date   BUBBLE STUDY  08/12/2019   Procedure: BUBBLE STUDY;  Surgeon: Parke Poisson, MD;  Location[facility] END[facility]COPY;  Service: Cardiology;;   EXTERNAL EAR SURGERY     TEE WITHOUT CARDIOVERSION N/A 08/12/2019  Procedure: TRANSESOPHAGEAL ECHOCARDIOGRAM (TEE);  Surgeon: Parke Poisson, MD;  Location: [facility] ENDOSCOPY;  Service: Cardiology;  Laterality: N/A;    Social History   Socioeconomic History   Marital status: Single    Spouse name: Not on file   Number of children: 6   Years of education: Not on file   Highest education level: Not on file  Occupational History   Not on file  Tobacco Use   Smoking status: Every Day    Packs/day: 0.25    Years: 35.00    Additional pack years: 0.00    Total pack years: 8.75    Types: Cigarettes   Smokeless tobacco: Never   Vaping Use   Vaping Use: Never used  Substance and Sexual Activity   Alcohol use: No   Drug use: Yes    Types: Marijuana    Comment: smokes several blunts on the weekend.   Sexual activity: Yes  Other Topics Concern   Not on file  Social History Narrative   ** Merged History Encounter **       Lives in [place] w/ fiancee.  Does not routinely exercise but has very busy work-life, working M-F 12 hrs shifts and another 8 hrs on Saturdays.  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   Social Determinants of Health   Financial Resource Strain: Not on file  Food Insecurity: Not on file  Transportation Needs: Not on file  Physical Activity: Not on file  Stress: Not on file  Social Connections: Not on file  Intimate Partner Violence: Not on file    Family History  Problem Relation Age of Onset   Stroke Sister    Cancer Mother        died @ 65   Other Father        died of CO poisoning as a young man.    ROS: no fevers or chills, productive cough, hemoptysis, dysphasia, odynophagia, melena, hematochezia, dysuria, hematuria, rash, seizure activity, orthopnea, PND, pedal edema, claudication. Remaining systems are negative.  Physical Exam: Well-developed well-nourished in no acute distress.  Skin is warm and dry.  HEENT is normal.  Neck is supple.  Chest is clear to auscultation with normal expansion.  Cardiovascular exam is regular rate and rhythm.  Abdominal exam nontender or distended. No masses palpated. Extremities show no edema. neuro grossly intact  A/P  1 nonischemic cardiomyopathy-plan to continue carvedilol and Entresto.  Patient previously did not tolerate either Farxiga or spironolactone.  His LV function showed some improvement on most recent echocardiogram.  Will repeat.  2 hypertension-blood pressure is controlled today.  Continue present medications.  Check potassium and renal function.  3 hyperlipidemia-continue statin.  Check lipids and  liver.  4 history of syncope-this was previously felt secondary to orthostasis.  He has had no recurrent episodes since last office visit.  5 tobacco abuse-patient counseled on discontinuing.  6 history of CVA-continue aspirin and statin.  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 Link Snuffer of interventional radiology.  Will try and arrange.  I have also asked him again to follow-up with ENT concerning his ear mass.  Olga Millers, MD

## 2022-10-20 ENCOUNTER — Ambulatory Visit: Payer: 59 | Attending: Cardiology | Admitting: Cardiology

## 2022-10-20 ENCOUNTER — Telehealth (HOSPITAL_COMMUNITY): Payer: Self-pay

## 2022-10-20 ENCOUNTER — Encounter: Payer: Self-pay | Admitting: Cardiology

## 2022-10-20 VITALS — BP 126/80 | HR 52 | Ht 71.0 in | Wt 168.8 lb

## 2022-10-20 DIAGNOSIS — I420 Dilated cardiomyopathy: Secondary | ICD-10-CM

## 2022-10-20 DIAGNOSIS — I10 Essential (primary) hypertension: Secondary | ICD-10-CM | POA: Diagnosis not present

## 2022-10-20 DIAGNOSIS — E785 Hyperlipidemia, unspecified: Secondary | ICD-10-CM | POA: Diagnosis not present

## 2022-10-20 DIAGNOSIS — Z720 Tobacco use: Secondary | ICD-10-CM | POA: Diagnosis not present

## 2022-10-20 NOTE — Telephone Encounter (Signed)
Called to schedule f/u, no answer, left vm. AB  

## 2022-10-20 NOTE — Patient Instructions (Signed)
    Testing/Procedures:  Your physician has requested that you have an echocardiogram. Echocardiography is a painless test that uses sound waves to create images of your heart. It provides your doctor with information about the size and shape of your heart and how well your heart's chambers and valves are working. This procedure takes approximately one hour. There are no restrictions for this procedure. Please do NOT wear cologne, perfume, aftershave, or lotions (deodorant is allowed). Please arrive 15 minutes prior to your appointment time. [address]   Follow-Up: At [facility], you and your health needs are our priority.  As part of our continuing mission to provide you with exceptional heart care, we have created designated Provider Care Teams.  These Care Teams include your primary Cardiologist (physician) and Advanced Practice Providers (APPs -  Physician Assistants and Nurse Practitioners) who all work together to provide you with the care you need, when you need it.  We recommend signing up for the patient portal called "MyChart".  Sign up information is provided on this After Visit Summary.  MyChart is used to connect with patients for Virtual Visits (Telemedicine).  Patients are able to view lab/test results, encounter notes, upcoming appointments, etc.  Non-urgent messages can be sent to your provider as well.   To learn more about what you can do with MyChart, go to [email or web address].    Your next appointment:   12 month(s)  Provider:   Olga Millers, MD     [phone]

## 2022-10-21 ENCOUNTER — Encounter: Payer: Self-pay | Admitting: *Deleted

## 2022-10-21 LAB — COMPREHENSIVE METABOLIC PANEL
ALT: 30 IU/L (ref 0–44)
AST: 39 IU/L (ref 0–40)
Albumin/Globulin Ratio: 1.7 (ref 1.2–2.2)
Albumin: 4 g/dL (ref 3.8–4.9)
Alkaline Phosphatase: 97 IU/L (ref 44–121)
BUN/Creatinine Ratio: 11 (ref 9–20)
BUN: 12 mg/dL (ref 6–24)
Bilirubin Total: 0.6 mg/dL (ref 0.0–1.2)
CO2: 28 mmol/L (ref 20–29)
Calcium: 8.8 mg/dL (ref 8.7–10.2)
Chloride: 105 mmol/L (ref 96–106)
Creatinine, Ser: 1.07 mg/dL (ref 0.76–1.27)
Globulin, Total: 2.3 g/dL (ref 1.5–4.5)
Glucose: 82 mg/dL (ref 70–99)
Potassium: 4 mmol/L (ref 3.5–5.2)
Sodium: 142 mmol/L (ref 134–144)
Total Protein: 6.3 g/dL (ref 6.0–8.5)
eGFR: 80 mL/min/{1.73_m2} (ref >59)

## 2022-10-21 LAB — LIPID PANEL
Chol/HDL Ratio: 2.4 ratio (ref 0.0–5.0)
Cholesterol, Total: 75 mg/dL — ABNORMAL LOW (ref 100–199)
HDL: 31 mg/dL — ABNORMAL LOW (ref >39)
LDL Chol Calc (NIH): 32 mg/dL (ref 0–99)
Triglycerides: 46 mg/dL (ref 0–149)
VLDL Cholesterol Cal: 12 mg/dL (ref 5–40)

## 2022-11-01 ENCOUNTER — Telehealth (HOSPITAL_COMMUNITY): Payer: Self-pay

## 2022-11-01 NOTE — Telephone Encounter (Signed)
Called to reschedule angiogram, no answer, left vm. AB

## 2022-11-11 ENCOUNTER — Ambulatory Visit (HOSPITAL_COMMUNITY): Payer: 59

## 2022-11-17 ENCOUNTER — Encounter: Payer: Self-pay | Admitting: Cardiology

## 2022-11-17 ENCOUNTER — Ambulatory Visit (HOSPITAL_COMMUNITY): Payer: 59 | Attending: Cardiology

## 2022-11-17 DIAGNOSIS — I517 Cardiomegaly: Secondary | ICD-10-CM | POA: Diagnosis not present

## 2022-11-17 DIAGNOSIS — I420 Dilated cardiomyopathy: Secondary | ICD-10-CM | POA: Insufficient documentation

## 2022-11-17 LAB — ECHOCARDIOGRAM COMPLETE
Area-P 1/2: 3.54 cm2
Est EF: 45
S' Lateral: 4.6 cm

## 2022-11-30 ENCOUNTER — Other Ambulatory Visit: Payer: Self-pay | Admitting: Radiology

## 2022-11-30 DIAGNOSIS — I671 Cerebral aneurysm, nonruptured: Secondary | ICD-10-CM

## 2022-12-01 ENCOUNTER — Other Ambulatory Visit: Payer: Self-pay | Admitting: Internal Medicine

## 2022-12-01 NOTE — H&P (Signed)
Chief Complaint: Patient was seen in consultation today for left ICA aneurysm at the request of Deveshwar,Sanjeev  Referring Physician(s): Deveshwar,Sanjeev  Supervising Physician: Julieanne Cotton  Patient Status: [facility] - Out-pt  History of Present Illness:  David [name] is a 57 y.o. male followed by neurology for CVA that occurred in 2021.  Patient presented to ED as a code stroke with sudden onset slurred speech and facial droop 08/09/2019.  CT head at that time showed acute/subacute infarction in the mid left frontal lobe and left frontal operculum.  CTA demonstrated left ICA 7 mm annular aneurysm.  Patient was referred at that time to IR for evaluation.  There have been numerous attempts by scheduling to be evaluated by NIR over the past few years but patient stated he was not ready to proceed.  Patient has now decided that he wants to proceed with diagnostic cerebral angiogram for evaluation of left ICA aneurysm.  Pt denies fever, loss of appetite, SOB, abd pain, N/V, dizziness, weakness or headaches.  He endorses chills, fatigue, intermittent chest pain, constipation and anxiety about procedure.  He is NPO per order.   Past Medical History:  Diagnosis Date   Acute ischemic left MCA stroke (HCC)    a. 08/2019 MRI: Acute L MCA branch vessel infarctions; b. 08/2019 CTA head/neck: Patent bilat carotids/vertebrals. High grade stenosis - inf division of prox L MCA M2 branch. additional high-grade mid-dist superior L MCA M2 branch occlusion. 7mm aneurysm arising from cavernous L ICA.   Cardiomyopathy (HCC)    a. 08/2019 Echo: EF 20-25%, global HK. Gr2 DD. Nl RV fxn. RVSP 30.20mmHg.  Mildly dil LA. Mod MR.   Cerebral aneurysm    a. 08/2019 CTA Head: 7mm aneurysm arising from the cavernous L ICA.   CHF (congestive heart failure) (HCC)    CKD stage G2/A2, GFR 60-89 and albumin creatinine ratio 30-299 mg/g    Facial mass    a. CT head: 7.0 x 2.3 cm soft tissue mass along lat aspect of L  temporal bone, involving L ext ear - highly suscpicious for malignancy.   Heart attack (HCC)    Heart murmur    Hypertension    Moderate mitral regurgitation    Nonischemic cardiomyopathy (HCC)    PFO (patent foramen ovale)    Stroke (HCC)    Tobacco abuse     Past Surgical History:  Procedure Laterality Date   BUBBLE STUDY  08/12/2019   Procedure: BUBBLE STUDY;  Surgeon: Parke Poisson, MD;  Location: [facility] ENDOSCOPY;  Service: Cardiology;;   EXTERNAL EAR SURGERY     TEE WITHOUT CARDIOVERSION N/A 08/12/2019   Procedure: TRANSESOPHAGEAL ECHOCARDIOGRAM (TEE);  Surgeon: Parke Poisson, MD;  Location: [facility] ENDOSCOPY;  Service: Cardiology;  Laterality: N/A;    Allergies: Patient has no known allergies.  Medications: Prior to Admission medications   Medication Sig Start Date End Date Taking? Authorizing Provider  Acetaminophen (TYLENOL PO) Take by mouth.    [provider]  aspirin EC 81 MG tablet Take 1 tablet (81 mg total) by mouth daily. 11/12/19   Marinus Maw, MD  atorvastatin (LIPITOR) 80 MG tablet TAKE 1 TABLET BY MOUTH ONCE DAILY AT 6 PM. 03/14/22   Lewayne Bunting, MD  carvedilol (COREG) 6.25 MG tablet Take 1 tablet by mouth twice daily 03/14/22   Lewayne Bunting, MD  famotidine (PEPCID) 40 MG tablet Take 1 tablet (40 mg total) by mouth every 12 (twelve) hours as needed for heartburn or  indigestion. 06/07/22   Jenel Lucks, MD  sacubitril-valsartan (ENTRESTO) 24-26 MG Take 1 tablet by mouth 2 (two) times daily. 12/09/20   Ronney Asters, NP     Family History  Problem Relation Age of Onset   Stroke Sister    Cancer Mother        died @ 63   Other Father        died of CO poisoning as a young man.    Social History   Socioeconomic History   Marital status: Single    Spouse name: Not on file   Number of children: 6   Years of education: Not on file   Highest education level: Not on file  Occupational History   Not on file  Tobacco Use   Smoking  status: Every Day    Packs/day: 0.25    Years: 35.00    Additional pack years: 0.00    Total pack years: 8.75    Types: Cigarettes   Smokeless tobacco: Never  Vaping Use   Vaping Use: Never used  Substance and Sexual Activity   Alcohol use: No   Drug use: Yes    Types: Marijuana    Comment: smokes several blunts on the weekend.   Sexual activity: Yes  Other Topics Concern   Not on file  Social History Narrative   ** Merged History Encounter **       Lives in [place] w/ fiancee.  Does not routinely exercise but has very busy work-life, working M-F 12 hrs shifts and another 8 hrs on Saturdays.  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   Social Determinants of Health   Financial Resource Strain: Not on file  Food Insecurity: Not on file  Transportation Needs: Not on file  Physical Activity: Not on file  Stress: Not on file  Social Connections: Not on file   Review of Systems: A 12 point ROS discussed and pertinent positives are indicated in the HPI above.  All other systems are negative.  Review of Systems  Constitutional:  Positive for chills and fatigue. Negative for appetite change and fever.  Respiratory:  Negative for shortness of breath.   Cardiovascular:  Positive for chest pain.  Gastrointestinal:  Positive for constipation. Negative for abdominal pain, nausea and vomiting.  Neurological:  Negative for dizziness, weakness and headaches.  Psychiatric/Behavioral:  The patient is nervous/anxious.     Vital Signs: BP 124/80   Pulse (!) 58   Temp 98.2 F (36.8 C) (Oral)   Resp 18   Ht 5\' 11"  (1.803 m)   Wt 170 lb (77.1 kg)   SpO2 100%   BMI 23.71 kg/m   Pt is a FULL CODE STATUS  Physical Exam Vitals reviewed.  Constitutional:      General: He is not in acute distress.    Appearance: Normal appearance. He is not ill-appearing.  HENT:     Head: Normocephalic and atraumatic.     Ears:     Comments: Bandage to L ear.     Mouth/Throat:      Mouth: Mucous membranes are dry.     Pharynx: Oropharynx is clear.  Eyes:     Extraocular Movements: Extraocular movements intact.     Pupils: Pupils are equal, round, and reactive to light.  Cardiovascular:     Rate and Rhythm: Regular rhythm. Bradycardia present.     Pulses: Normal pulses.     Heart sounds: Normal heart sounds.  Pulmonary:     Effort: Pulmonary effort is normal. No respiratory distress.     Breath sounds: Normal breath sounds.  Abdominal:     General: Bowel sounds are normal. There is no distension.     Tenderness: There is no abdominal tenderness. There is no guarding.  Musculoskeletal:     Right lower leg: No edema.     Left lower leg: No edema.  Skin:    General: Skin is warm and dry.  Neurological:     Mental Status: He is alert and oriented to person, place, and time.  Psychiatric:        Mood and Affect: Mood normal.        Behavior: Behavior normal.        Thought Content: Thought content normal.        Judgment: Judgment normal.     Imaging: ECHOCARDIOGRAM COMPLETE  Result Date: 11/17/2022    ECHOCARDIOGRAM REPORT   Patient Name:   TYMEL KOPACZ [facility] Date of Exam: 11/17/2022 Medical Rec #:  [id number]       Height:       71.0 in Accession #:    [id number]      Weight:       168.8 lb Date of Birth:  Aug 27, 1965       BSA:          1.962 m Patient Age:    56 years        BP:           126/60 mmHg Patient Gender: M               HR:           60 bpm. Exam Location:  [address] Procedure: 2D Echo, 3D Echo, Cardiac Doppler and Color Doppler Indications:    I42.0 Dilated Cardiomyopathy  History:        Patient has prior history of Echocardiogram examinations, most                 recent 08/25/2021. CHF, Stroke and CKD, Signs/Symptoms:Murmur;                 Risk Factors:Hypertension. PFO.  Sonographer:    Clearence Ped RCS Referring Phys: 56 BRIAN S CRENSHAW IMPRESSIONS  1. Left ventricular ejection fraction, by estimation, is 45%. The left ventricle has mildly  decreased function. The left ventricle demonstrates regional wall motion abnormalities (see scoring diagram/findings for description). The left ventricular internal cavity size was mildly dilated. There is mild left ventricular hypertrophy. Left ventricular diastolic parameters are indeterminate.  2. Right ventricular systolic function is normal. The right ventricular size is normal. There is normal pulmonary artery systolic pressure. The estimated right ventricular systolic pressure is 24.3 mmHg.  3. The mitral valve is normal in structure. Mild mitral valve regurgitation. No evidence of mitral stenosis.  4. The aortic valve is tricuspid. Aortic valve regurgitation is not visualized. No aortic stenosis is present.  5. The inferior vena cava is normal in size with greater than 50% respiratory variability, suggesting right atrial pressure of 3 mmHg. FINDINGS  Left Ventricle: Left ventricular ejection fraction, by estimation, is 45%. The left ventricle has mildly decreased function. The left ventricle demonstrates regional wall motion abnormalities. 3D ejection fraction reviewed and evaluated as part of the interpretation. Alternate measurement of EF is felt to be most reflective of LV function. The left ventricular internal cavity size was mildly dilated. There is mild left ventricular hypertrophy. Left ventricular  diastolic parameters are indeterminate.  LV Wall Scoring: The entire septum is hypokinetic. Right Ventricle: The right ventricular size is normal. No increase in right ventricular wall thickness. Right ventricular systolic function is normal. There is normal pulmonary artery systolic pressure. The tricuspid regurgitant velocity is 2.31 m/s, and  with an assumed right atrial pressure of 3 mmHg, the estimated right ventricular systolic pressure is 24.3 mmHg. Left Atrium: Left atrial size was normal in size. Right Atrium: Right atrial size was normal in size. Pericardium: There is no evidence of pericardial  effusion. Mitral Valve: The mitral valve is normal in structure. Mild mitral valve regurgitation. No evidence of mitral valve stenosis. Tricuspid Valve: The tricuspid valve is normal in structure. Tricuspid valve regurgitation is trivial. No evidence of tricuspid stenosis. Aortic Valve: The aortic valve is tricuspid. Aortic valve regurgitation is not visualized. No aortic stenosis is present. Pulmonic Valve: The pulmonic valve was normal in structure. Pulmonic valve regurgitation is trivial. No evidence of pulmonic stenosis. Aorta: The aortic root is normal in size and structure. Venous: The inferior vena cava is normal in size with greater than 50% respiratory variability, suggesting right atrial pressure of 3 mmHg. IAS/Shunts: There is redundancy of the interatrial septum. No atrial level shunt detected by color flow Doppler.  LEFT VENTRICLE PLAX 2D LVIDd:         6.10 cm   Diastology LVIDs:         4.60 cm   LV e' medial:    6.96 cm/s LV PW:         1.10 cm   LV E/e' medial:  8.2 LV IVS:        1.00 cm   LV e' lateral:   5.00 cm/s LVOT diam:     2.40 cm   LV E/e' lateral: 11.4 LV SV:         66 LV SV Index:   34 LVOT Area:     4.52 cm                           3D Volume EF:                          3D EF:        54 %                          LV EDV:       182 ml                          LV ESV:       84 ml                          LV SV:        98 ml RIGHT VENTRICLE RV Basal diam:  3.40 cm RV S prime:     13.60 cm/s TAPSE (M-mode): 2.2 cm RVSP:           24.3 mmHg LEFT ATRIUM             Index        RIGHT ATRIUM           Index LA diam:        4.00 cm 2.04 cm/m   RA Pressure: 3.00 mmHg LA Vol (A2C):  58.7 ml 29.92 ml/m  RA Area:     13.20 cm LA Vol (A4C):   38.2 ml 19.50 ml/m  RA Volume:   31.10 ml  15.85 ml/m LA Biplane Vol: 52.1 ml 26.56 ml/m  AORTIC VALVE LVOT Vmax:   72.70 cm/s LVOT Vmean:  51.300 cm/s LVOT VTI:    0.146 m  AORTA Ao Root diam: 3.30 cm Ao Asc diam:  3.20 cm MITRAL VALVE                TRICUSPID VALVE MV Area (PHT):             TR Peak grad:   21.3 mmHg MV Decel Time:             TR Vmax:        231.00 cm/s MV E velocity: 57.10 cm/s  Estimated RAP:  3.00 mmHg MV A velocity: 57.60 cm/s  RVSP:           24.3 mmHg MV E/A ratio:  0.99                            SHUNTS                            Systemic VTI:  0.15 m                            Systemic Diam: 2.40 cm Weston Brass MD Electronically signed by Weston Brass MD Signature Date/Time: 11/17/2022/8:37:28 AM    Final     Labs:  CBC: Recent Labs    06/07/22 1530  WBC 7.0  HGB 13.7  HCT 41.1  PLT 230.0    COAGS: No results for input(s): "INR", "APTT" in the last 8760 hours.  BMP: Recent Labs    10/20/22 1003  NA 142  K 4.0  CL 105  CO2 28  GLUCOSE 82  BUN 12  CALCIUM 8.8  CREATININE 1.07    LIVER FUNCTION TESTS: Recent Labs    10/20/22 1003  BILITOT 0.6  AST 39  ALT 30  ALKPHOS 97  PROT 6.3  ALBUMIN 4.0    TUMOR MARKERS: No results for input(s): "AFPTM", "CEA", "CA199", "CHROMGRNA" in the last 8760 hours.  Assessment and Plan:  57 year old male with PMHx significant for left MCA stroke, cardiomyopathy, cerebral aneurysm, CHF, CKD stage G2/A2, MI, heart murmur, HTN, moderate mitral regurgitation, PFO, stroke and tobacco abuse presents to [facility] for diagnostic cerebral angiogram to evaluate left ICA aneurysm  Call received from nursing staff stating pt was refusing to have any care until he speaks with APP. Pt was seen at bedside with Dr. Corliss Skains. Pt verbalized extreme anxiety over procedure. Procedure and risks were discussed. Pt was encouraged if he has any concerns, he should consider rescheduling procedure. Patient verbalizes wanting to proceed with diagnostic angiogram today.  Pt provided with pre procedure xanax for anxiety.   Pt sitting upright on edge of bed.  He is A&O, nervous but pleasant.  He is in no distress.  Risks and benefits of diagnostic cerebral angiogram with moderate  sedation were discussed with the patient including, but not limited to bleeding, infection, vascular injury, contrast induced renal failure, stroke or even death.  This interventional procedure involves the use of X-rays and because of the nature of the planned procedure, it is possible that we will  have prolonged use of X-ray fluoroscopy.  Potential radiation risks to you include (but are not limited to) the following: - A slightly elevated risk for cancer  several years later in life. This risk is typically less than 0.5% percent. This risk is low in comparison to the normal incidence of human cancer, which is 33% for women and 50% for men according to the American Cancer Society. - Radiation induced injury can include skin redness, resembling a rash, tissue breakdown / ulcers and hair loss (which can be temporary or permanent).   The likelihood of either of these occurring depends on the difficulty of the procedure and whether you are sensitive to radiation due to previous procedures, disease, or genetic conditions.   IF your procedure requires a prolonged use of radiation, you will be notified and given written instructions for further action.  It is your responsibility to monitor the irradiated area for the 2 weeks following the procedure and to notify your physician if you are concerned that you have suffered a radiation induced injury.    All of the patient's questions were answered, patient is agreeable to proceed.  Consent signed and in chart.  Thank you for this interesting consult.  I greatly enjoyed meeting Traeger J [name] and look forward to participating in their care.  A copy of this report was sent to the requesting provider on this date.  Electronically Signed: Shon Hough, NP 12/02/2022, 11:53 AM   I spent a total of 20 minutes in face to face in clinical consultation, greater than 50% of which was counseling/coordinating care for left cavernous ICA aneurysm.

## 2022-12-02 ENCOUNTER — Ambulatory Visit (HOSPITAL_COMMUNITY)
Admission: RE | Admit: 2022-12-02 | Discharge: 2022-12-02 | Disposition: A | Discharge status: Home / Self Care | Payer: 59 | Source: Ambulatory Visit | Attending: Interventional Radiology | Admitting: Interventional Radiology

## 2022-12-02 ENCOUNTER — Telehealth: Payer: Self-pay | Admitting: Gastroenterology

## 2022-12-02 ENCOUNTER — Other Ambulatory Visit (HOSPITAL_COMMUNITY): Payer: Self-pay | Admitting: Interventional Radiology

## 2022-12-02 DIAGNOSIS — I429 Cardiomyopathy, unspecified: Secondary | ICD-10-CM | POA: Diagnosis not present

## 2022-12-02 DIAGNOSIS — I509 Heart failure, unspecified: Secondary | ICD-10-CM | POA: Diagnosis not present

## 2022-12-02 DIAGNOSIS — Z8673 Personal history of transient ischemic attack (TIA), and cerebral infarction without residual deficits: Secondary | ICD-10-CM | POA: Insufficient documentation

## 2022-12-02 DIAGNOSIS — F17210 Nicotine dependence, cigarettes, uncomplicated: Secondary | ICD-10-CM | POA: Insufficient documentation

## 2022-12-02 DIAGNOSIS — I671 Cerebral aneurysm, nonruptured: Secondary | ICD-10-CM

## 2022-12-02 DIAGNOSIS — I340 Nonrheumatic mitral (valve) insufficiency: Secondary | ICD-10-CM | POA: Insufficient documentation

## 2022-12-02 DIAGNOSIS — I130 Hypertensive heart and chronic kidney disease with heart failure and stage 1 through stage 4 chronic kidney disease, or unspecified chronic kidney disease: Secondary | ICD-10-CM | POA: Insufficient documentation

## 2022-12-02 DIAGNOSIS — I252 Old myocardial infarction: Secondary | ICD-10-CM | POA: Diagnosis not present

## 2022-12-02 DIAGNOSIS — N182 Chronic kidney disease, stage 2 (mild): Secondary | ICD-10-CM | POA: Diagnosis not present

## 2022-12-02 HISTORY — PX: IR US GUIDE VASC ACCESS RIGHT: IMG2390

## 2022-12-02 HISTORY — PX: IR ANGIO VERTEBRAL SEL VERTEBRAL BILAT MOD SED: IMG5369

## 2022-12-02 HISTORY — PX: IR ANGIO INTRA EXTRACRAN SEL COM CAROTID INNOMINATE BILAT MOD SED: IMG5360

## 2022-12-02 LAB — BASIC METABOLIC PANEL
Anion gap: 6 (ref 5–15)
BUN: 12 mg/dL (ref 6–20)
CO2: 27 mmol/L (ref 22–32)
Calcium: 9 mg/dL (ref 8.9–10.3)
Chloride: 103 mmol/L (ref 98–111)
Creatinine, Ser: 1.05 mg/dL (ref 0.61–1.24)
GFR, Estimated: >60 mL/min (ref >60)
Glucose, Bld: 102 mg/dL — ABNORMAL HIGH (ref 70–99)
Potassium: 3.7 mmol/L (ref 3.5–5.1)
Sodium: 136 mmol/L (ref 135–145)

## 2022-12-02 LAB — CBC
HCT: 44 % (ref 39.0–52.0)
Hemoglobin: 14.6 g/dL (ref 13.0–17.0)
MCH: 31.7 pg (ref 26.0–34.0)
MCHC: 33.2 g/dL (ref 30.0–36.0)
MCV: 95.7 fL (ref 80.0–100.0)
Platelets: 204 10*3/uL (ref 150–400)
RBC: 4.6 MIL/uL (ref 4.22–5.81)
RDW: 12.6 % (ref 11.5–15.5)
WBC: 7.2 10*3/uL (ref 4.0–10.5)
nRBC: 0 % (ref 0.0–0.2)

## 2022-12-02 LAB — PROTIME-INR
INR: 1 (ref 0.8–1.2)
Prothrombin Time: 13.7 seconds (ref 11.4–15.2)

## 2022-12-02 MED ORDER — SODIUM CHLORIDE (PF) 0.9 % IJ SOLN
INTRAVENOUS | Status: AC | PRN
  Administered 2022-12-02 (×2): 200 ug via INTRA_ARTERIAL

## 2022-12-02 MED ORDER — FENTANYL CITRATE (PF) 100 MCG/2ML IJ SOLN
INTRAMUSCULAR | Status: AC | PRN
  Administered 2022-12-02: 25 ug via INTRAVENOUS

## 2022-12-02 MED ORDER — SODIUM CHLORIDE 0.9 % IV SOLN: INTRAVENOUS | Status: AC

## 2022-12-02 MED ORDER — IOHEXOL 300 MG/ML  SOLN
150.0000 mL | Freq: Once | INTRAMUSCULAR | Status: AC | PRN
  Administered 2022-12-02: 75 mL via INTRA_ARTERIAL

## 2022-12-02 MED ORDER — LIDOCAINE HCL 1 % IJ SOLN
INTRAMUSCULAR | Status: AC
  Filled 2022-12-02: qty 20

## 2022-12-02 MED ORDER — MIDAZOLAM HCL 2 MG/2ML IJ SOLN
INTRAMUSCULAR | Status: AC
  Filled 2022-12-02: qty 2

## 2022-12-02 MED ORDER — HEPARIN SODIUM (PORCINE) 1000 UNIT/ML IJ SOLN
INTRAMUSCULAR | Status: AC | PRN
  Administered 2022-12-02: 2000 [IU] via INTRAVENOUS

## 2022-12-02 MED ORDER — VERAPAMIL HCL 2.5 MG/ML IV SOLN
INTRAVENOUS | Status: AC
  Filled 2022-12-02: qty 2

## 2022-12-02 MED ORDER — HEPARIN SODIUM (PORCINE) 1000 UNIT/ML IJ SOLN
INTRAMUSCULAR | Status: AC
  Filled 2022-12-02: qty 10

## 2022-12-02 MED ORDER — FENTANYL CITRATE (PF) 100 MCG/2ML IJ SOLN
INTRAMUSCULAR | Status: AC
  Filled 2022-12-02: qty 2

## 2022-12-02 MED ORDER — NITROGLYCERIN 1 MG/10 ML FOR IR/CATH LAB
INTRA_ARTERIAL | Status: AC
  Filled 2022-12-02: qty 10

## 2022-12-02 MED ORDER — ALPRAZOLAM 0.25 MG PO TABS
1.0000 mg | ORAL_TABLET | Freq: Once | ORAL | Status: AC
  Administered 2022-12-02: 1 mg via ORAL
  Filled 2022-12-02: qty 4

## 2022-12-02 MED ORDER — SODIUM CHLORIDE 0.9 % IV SOLN: Freq: Once | INTRAVENOUS | Status: AC

## 2022-12-02 MED ORDER — MIDAZOLAM HCL 2 MG/2ML IJ SOLN
INTRAMUSCULAR | Status: AC | PRN
  Administered 2022-12-02: 1 mg via INTRAVENOUS

## 2022-12-02 MED ORDER — SODIUM CHLORIDE 0.9 % IV BOLUS
250.0000 mL | Freq: Once | INTRAVENOUS | Status: AC
  Administered 2022-12-02: 250 mL via INTRAVENOUS

## 2022-12-02 NOTE — Telephone Encounter (Signed)
Inbound call from patient's fiance stating when patient was seen on 01/02 he was advised that he would need a colonoscopy scheduled at the hospital. States they have not heard anything back. States patient is now experiencing constipation and sever abdominal pain. Requesting a call back to discuss further. Please  advise, thank you.

## 2022-12-02 NOTE — Procedures (Signed)
INR.  Status post four-vessel cerebral arteriogram.  Right radial approach.  Findings.  1.  Occluded left vertebral basilar junction distal to the origin of the left posterior inferior cerebellar artery.  2.  Mild fusiform dilatation  of the cavernous left ICA.  3.  Wide patency of the left middle cerebral artery branches.  Fatima Sanger MD.

## 2022-12-05 NOTE — Telephone Encounter (Signed)
Spoke with pts fiance and she states pt is having some issues with constipation. She gave him a dose of miralax and nothing happened. Discussed with her that he can take up to 3 doses of miralax a day as needed to have BM. They will try this and call back if he continues to have issues. He is on the hospital list for colonoscopy and will be scheduled when slot is available.

## 2022-12-07 ENCOUNTER — Telehealth: Payer: Self-pay

## 2022-12-07 NOTE — Telephone Encounter (Signed)
Left Vm for patient to return call regarding opening on 01/31/23

## 2022-12-28 ENCOUNTER — Other Ambulatory Visit: Payer: Self-pay

## 2022-12-28 DIAGNOSIS — Z1211 Encounter for screening for malignant neoplasm of colon: Secondary | ICD-10-CM

## 2022-12-28 MED ORDER — NA SULFATE-K SULFATE-MG SULF 17.5-3.13-1.6 GM/177ML PO SOLN: 1.0000 | Freq: Once | ORAL | 0 refills | Status: AC

## 2023-01-02 ENCOUNTER — Telehealth: Payer: Self-pay

## 2023-01-02 NOTE — Telephone Encounter (Signed)
Left VM for patient to return call to go over instructions as requested by patient.

## 2023-01-02 NOTE — Telephone Encounter (Signed)
Contacted patient's partner and went over instructions so that she can help patient with prep.

## 2023-01-02 NOTE — Telephone Encounter (Signed)
Patient is returning your call [phone]

## 2023-01-03 ENCOUNTER — Encounter (HOSPITAL_COMMUNITY): Payer: Self-pay | Admitting: Gastroenterology

## 2023-01-03 NOTE — Progress Notes (Signed)
David [name]  Prep instructions- reviewed  PCP-Richard Kateri Plummer NP Cardiologist- C[place]shaw  EKG- 02/17/22 Echo-11/17/22 Cath-n/a Stress- n/a ICD/PM-n/a Blood thinner- n/a GLP-1- n/a  Hx:CHF, murmur, HTN, MI, Stroke, CKD, cerebral aneurysm, cardiomyopathy, PFO. Last saw cardiology 10/20/22 with a yearly follow up. Did also have four-vessel cerebral arteriogram on 6/28 to f/u on an old CVA in 2021.  Anesthesia Review: Yes

## 2023-01-03 NOTE — Progress Notes (Signed)
Attempted to obtain medical history via telephone, unable to reach at this time. HIPAA compliant voicemail message left requesting return call to pre surgical testing department. 

## 2023-01-10 ENCOUNTER — Encounter (HOSPITAL_COMMUNITY)
Admission: RE | Disposition: A | Discharge status: Home / Self Care | Payer: Self-pay | Source: Ambulatory Visit | Attending: Gastroenterology

## 2023-01-10 ENCOUNTER — Ambulatory Visit (HOSPITAL_COMMUNITY): Payer: 59 | Admitting: Physician Assistant

## 2023-01-10 ENCOUNTER — Ambulatory Visit (HOSPITAL_BASED_OUTPATIENT_CLINIC_OR_DEPARTMENT_OTHER): Payer: 59 | Admitting: Physician Assistant

## 2023-01-10 ENCOUNTER — Other Ambulatory Visit: Payer: Self-pay

## 2023-01-10 ENCOUNTER — Ambulatory Visit (HOSPITAL_COMMUNITY)
Admission: RE | Admit: 2023-01-10 | Discharge: 2023-01-10 | Disposition: A | Discharge status: Home / Self Care | Payer: 59 | Source: Ambulatory Visit | Attending: Gastroenterology | Admitting: Gastroenterology

## 2023-01-10 DIAGNOSIS — I5042 Chronic combined systolic (congestive) and diastolic (congestive) heart failure: Secondary | ICD-10-CM | POA: Diagnosis not present

## 2023-01-10 DIAGNOSIS — Z1211 Encounter for screening for malignant neoplasm of colon: Secondary | ICD-10-CM | POA: Diagnosis not present

## 2023-01-10 DIAGNOSIS — D126 Benign neoplasm of colon, unspecified: Secondary | ICD-10-CM | POA: Diagnosis not present

## 2023-01-10 DIAGNOSIS — F17210 Nicotine dependence, cigarettes, uncomplicated: Secondary | ICD-10-CM | POA: Diagnosis not present

## 2023-01-10 DIAGNOSIS — D123 Benign neoplasm of transverse colon: Secondary | ICD-10-CM | POA: Insufficient documentation

## 2023-01-10 DIAGNOSIS — N189 Chronic kidney disease, unspecified: Secondary | ICD-10-CM

## 2023-01-10 DIAGNOSIS — K5730 Diverticulosis of large intestine without perforation or abscess without bleeding: Secondary | ICD-10-CM | POA: Diagnosis not present

## 2023-01-10 DIAGNOSIS — I130 Hypertensive heart and chronic kidney disease with heart failure and stage 1 through stage 4 chronic kidney disease, or unspecified chronic kidney disease: Secondary | ICD-10-CM

## 2023-01-10 HISTORY — PX: COLONOSCOPY WITH PROPOFOL: SHX5780

## 2023-01-10 HISTORY — PX: POLYPECTOMY: SHX5525

## 2023-01-10 SURGERY — COLONOSCOPY WITH PROPOFOL
Anesthesia: Monitor Anesthesia Care

## 2023-01-10 MED ORDER — PROPOFOL 500 MG/50ML IV EMUL
INTRAVENOUS | Status: AC
  Filled 2023-01-10: qty 50

## 2023-01-10 MED ORDER — PROPOFOL 10 MG/ML IV BOLUS
INTRAVENOUS | Status: DC | PRN
Start: 2023-01-10 — End: 2023-01-10
  Administered 2023-01-10 (×2): 30 mg via INTRAVENOUS

## 2023-01-10 MED ORDER — LIDOCAINE 2% (20 MG/ML) 5 ML SYRINGE
INTRAMUSCULAR | Status: DC | PRN
  Administered 2023-01-10: 50 mg via INTRAVENOUS

## 2023-01-10 MED ORDER — PROPOFOL 500 MG/50ML IV EMUL
INTRAVENOUS | Status: DC | PRN
  Administered 2023-01-10: 150 ug/kg/min via INTRAVENOUS

## 2023-01-10 MED ORDER — LACTATED RINGERS IV SOLN: INTRAVENOUS | Status: DC

## 2023-01-10 MED ORDER — SODIUM CHLORIDE 0.9 % IV SOLN: INTRAVENOUS | Status: DC

## 2023-01-10 SURGICAL SUPPLY — 22 items
ELECT REM PT RETURN 9FT ADLT (ELECTROSURGICAL)
ELECTRODE REM PT RTRN 9FT ADLT (ELECTROSURGICAL) IMPLANT
FCP BXJMBJMB 240X2.8X (CUTTING FORCEPS)
FLOOR PAD 36X40 (MISCELLANEOUS) ×2
FORCEPS BIOP RAD 4 LRG CAP 4 (CUTTING FORCEPS) IMPLANT
FORCEPS BIOP RJ4 240 W/NDL (CUTTING FORCEPS)
FORCEPS BXJMBJMB 240X2.8X (CUTTING FORCEPS) IMPLANT
INJECTOR/SNARE I SNARE (MISCELLANEOUS) IMPLANT
LUBRICANT JELLY 4.5OZ STERILE (MISCELLANEOUS) IMPLANT
MANIFOLD NEPTUNE II (INSTRUMENTS) IMPLANT
NDL SCLEROTHERAPY 25GX240 (NEEDLE) IMPLANT
NEEDLE SCLEROTHERAPY 25GX240 (NEEDLE)
PAD FLOOR 36X40 (MISCELLANEOUS) ×3 IMPLANT
PROBE APC STR FIRE (PROBE) IMPLANT
PROBE INJECTION GOLD (MISCELLANEOUS)
PROBE INJECTION GOLD 7FR (MISCELLANEOUS) IMPLANT
SNARE ROTATE MED OVAL 20MM (MISCELLANEOUS) IMPLANT
SYR 50ML LL SCALE MARK (SYRINGE) IMPLANT
TRAP SPECIMEN MUCOUS 40CC (MISCELLANEOUS) IMPLANT
TUBING ENDO SMARTCAP PENTAX (MISCELLANEOUS) IMPLANT
TUBING IRRIGATION ENDOGATOR (MISCELLANEOUS) ×3 IMPLANT
WATER STERILE IRR 1000ML POUR (IV SOLUTION) IMPLANT

## 2023-01-10 NOTE — Transfer of Care (Signed)
Immediate Anesthesia Transfer of Care Note  Patient: David [name]  Procedure(s) Performed: COLONOSCOPY WITH PROPOFOL POLYPECTOMY  Patient Location: Endoscopy Unit  Anesthesia Type:MAC  Level of Consciousness: awake and patient cooperative  Airway & Oxygen Therapy: Patient Spontanous Breathing and Patient connected to face mask  Post-op Assessment: Report given to RN and Post -op Vital signs reviewed and stable  Post vital signs: Reviewed and stable  Last Vitals:  Vitals Value Taken Time  BP 113/59 01/10/23 0933  Temp 36.2 C 01/10/23 0928  Pulse 55 01/10/23 0933  Resp 20 01/10/23 0933  SpO2 100 % 01/10/23 0933  Vitals shown include unfiled device data.  Last Pain:  Vitals:   01/10/23 0933  TempSrc:   PainSc: Asleep         Complications: No notable events documented.

## 2023-01-10 NOTE — Anesthesia Preprocedure Evaluation (Addendum)
Anesthesia Evaluation  Patient identified by MRN, date of birth, ID band Patient awake    Reviewed: Allergy & Precautions, NPO status , Patient's Chart, lab work & pertinent test results, reviewed documented beta blocker date and time   Airway Mallampati: II  TM Distance: >3 FB Neck ROM: Full    Dental  (+) Missing, Chipped, Dental Advisory Given, Poor Dentition   Pulmonary Current Smoker and Patient abstained from smoking.   Pulmonary exam normal breath sounds clear to auscultation       Cardiovascular hypertension, Pt. on home beta blockers and Pt. on medications + Past MI and +CHF  Normal cardiovascular exam+ Valvular Problems/Murmurs MR  Rhythm:Regular Rate:Normal  TTE 2024 1. Left ventricular ejection fraction, by estimation, is 45%. The left  ventricle has mildly decreased function. The left ventricle demonstrates  regional wall motion abnormalities (see scoring diagram/findings for  description). The left ventricular  internal cavity size was mildly dilated. There is mild left ventricular  hypertrophy. Left ventricular diastolic parameters are indeterminate.   2. Right ventricular systolic function is normal. The right ventricular  size is normal. There is normal pulmonary artery systolic pressure. The  estimated right ventricular systolic pressure is 24.3 mmHg.   3. The mitral valve is normal in structure. Mild mitral valve  regurgitation. No evidence of mitral stenosis.   4. The aortic valve is tricuspid. Aortic valve regurgitation is not  visualized. No aortic stenosis is present.   5. The inferior vena cava is normal in size with greater than 50%  respiratory variability, suggesting right atrial pressure of 3 mmHg.     Neuro/Psych CVA, No Residual Symptoms  negative psych ROS   GI/Hepatic negative GI ROS, Neg liver ROS,,,  Endo/Other  negative endocrine ROS    Renal/GU Renal InsufficiencyRenal disease  negative  genitourinary   Musculoskeletal negative musculoskeletal ROS (+)    Abdominal   Peds  Hematology negative hematology ROS (+)   Anesthesia Other Findings   Reproductive/Obstetrics                             Anesthesia Physical Anesthesia Plan  ASA: 3  Anesthesia Plan: MAC   Post-op Pain Management:    Induction: Intravenous  PONV Risk Score and Plan: Propofol infusion and Treatment may vary due to age or medical condition  Airway Management Planned: Natural Airway  Additional Equipment:   Intra-op Plan:   Post-operative Plan:   Informed Consent: I have reviewed the patients History and Physical, chart, labs and discussed the procedure including the risks, benefits and alternatives for the proposed anesthesia with the patient or authorized representative who has indicated his/her understanding and acceptance.     Dental advisory given  Plan Discussed with: CRNA  Anesthesia Plan Comments:        Anesthesia Quick Evaluation

## 2023-01-10 NOTE — Anesthesia Postprocedure Evaluation (Signed)
Anesthesia Post Note  Patient: David [name]  Procedure(s) Performed: COLONOSCOPY WITH PROPOFOL POLYPECTOMY     Patient location during evaluation: Endoscopy Anesthesia Type: MAC Level of consciousness: awake and alert Pain management: pain level controlled Vital Signs Assessment: post-procedure vital signs reviewed and stable Respiratory status: spontaneous breathing, nonlabored ventilation, respiratory function stable and patient connected to nasal cannula oxygen Cardiovascular status: blood pressure returned to baseline and stable Postop Assessment: no apparent nausea or vomiting Anesthetic complications: no  No notable events documented.  Last Vitals:  Vitals:   01/10/23 0939 01/10/23 0950  BP: 130/85 131/69  Pulse: 71 60  Resp: 14 16  Temp:    SpO2: 100% 100%    Last Pain:  Vitals:   01/10/23 0950  TempSrc:   PainSc: 0-No pain                  L 

## 2023-01-10 NOTE — H&P (Signed)
[name] History and Physical   Primary Care Physician:  David Agee, NP   Reason for Procedure:   Colon cancer screening  Plan:    Screening colonoscopy     HPI: David [name] is a 57 y.o. male undergoing initial average risk screening colonoscopy.  He has no family history of colon cancer and no chronic GI symptoms.  He has a history of ischemic cardiomyopathy and ischemic stroke in 2021.   Past Medical History:  Diagnosis Date   Acute ischemic left MCA stroke (HCC)    a. 08/2019 MRI: Acute L MCA branch vessel infarctions; b. 08/2019 CTA head/neck: Patent bilat carotids/vertebrals. High grade stenosis - inf division of prox L MCA M2 branch. additional high-grade mid-dist superior L MCA M2 branch occlusion. 7mm aneurysm arising from cavernous L ICA.   Cardiomyopathy (HCC)    a. 08/2019 Echo: EF 20-25%, global HK. Gr2 DD. Nl RV fxn. RVSP 30.2mmHg.  Mildly dil LA. Mod MR.   Cerebral aneurysm    a. 08/2019 CTA Head: 7mm aneurysm arising from the cavernous L ICA.   CHF (congestive heart failure) (HCC)    CKD stage G2/A2, GFR 60-89 and albumin creatinine ratio 30-299 mg/g    Facial mass    a. CT head: 7.0 x 2.3 cm soft tissue mass along lat aspect of L temporal bone, involving L ext ear - highly suscpicious for malignancy.   Heart attack (HCC)    Heart murmur    Hypertension    Moderate mitral regurgitation    Nonischemic cardiomyopathy (HCC)    PFO (patent foramen ovale)    Stroke (HCC)    Tobacco abuse     Past Surgical History:  Procedure Laterality Date   BUBBLE STUDY  08/12/2019   Procedure: BUBBLE STUDY;  Surgeon: Parke Poisson, MD;  Location: [facility] ENDOSCOPY;  Service: Cardiology;;   EXTERNAL EAR SURGERY     IR ANGIO INTRA EXTRACRAN SEL COM CAROTID INNOMINATE BILAT MOD SED  12/02/2022   IR ANGIO VERTEBRAL SEL VERTEBRAL BILAT MOD SED  12/02/2022   IR US GUIDE VASC ACCESS RIGHT  12/02/2022   TEE WITHOUT CARDIOVERSION N/A 08/12/2019   Procedure:  TRANSESOPHAGEAL ECHOCARDIOGRAM (TEE);  Surgeon: Parke Poisson, MD;  Location: [facility] ENDOSCOPY;  Service: Cardiology;  Laterality: N/A;    Prior to Admission medications   Medication Sig Start Date End Date Taking? Authorizing Provider  aspirin EC 81 MG tablet Take 1 tablet (81 mg total) by mouth daily. 11/12/19  Yes Marinus Maw, MD  atorvastatin (LIPITOR) 80 MG tablet TAKE 1 TABLET BY MOUTH ONCE DAILY AT 6 PM. 03/14/22  Yes Lewayne Bunting, MD  carvedilol (COREG) 6.25 MG tablet Take 1 tablet by mouth twice daily 03/14/22  Yes Crenshaw, Madolyn Frieze, MD  famotidine (PEPCID) 40 MG tablet Take 1 tablet (40 mg total) by mouth every 12 (twelve) hours as needed for heartburn or indigestion. 06/07/22  Yes Jenel Lucks, MD  sacubitril-valsartan (ENTRESTO) 24-26 MG Take 1 tablet by mouth 2 (two) times daily. 12/09/20  Yes Ronney Asters, NP  Acetaminophen (TYLENOL PO) Take by mouth.    [provider]    Current Facility-Administered Medications  Medication Dose Route Frequency Provider Last Rate Last Admin   0.9 %  sodium chloride infusion   Intravenous Continuous Jenel Lucks, MD       lactated ringers infusion   Intravenous Continuous Jenel Lucks, MD 10 mL/hr at 01/10/23 0841 Continued from Pre-op at 01/10/23  7846    Allergies as of 12/28/2022   (No Known Allergies)    Family History  Problem Relation Age of Onset   Stroke Sister    Cancer Mother        died @ 79   Other Father        died of CO poisoning as a young man.    Social History   Socioeconomic History   Marital status: Single    Spouse name: Not on file   Number of children: 6   Years of education: Not on file   Highest education level: Not on file  Occupational History   Not on file  Tobacco Use   Smoking status: Every Day    Current packs/day: 0.25    Average packs/day: 0.3 packs/day for 35.0 years (8.8 ttl pk-yrs)    Types: Cigarettes   Smokeless tobacco: Never  Vaping Use   Vaping  status: Never Used  Substance and Sexual Activity   Alcohol use: No   Drug use: Yes    Types: Marijuana    Comment: smokes several blunts on the weekend.   Sexual activity: Yes  Other Topics Concern   Not on file  Social History Narrative   ** Merged History Encounter **       Lives in [place] w/ fiancee.  Does not routinely exercise but has very busy work-life, working M-F 12 hrs shifts and another 8 hrs on Saturdays.  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   Social Determinants of Health   Financial Resource Strain: Not on file  Food Insecurity: No Food Insecurity (03/28/2022)   Received from [facility]   Hunger Vital Sign    Worried About Running Out of Food in the Last Year: Never true    Ran Out of Food in the Last Year: Never true  Transportation Needs: Not on file  Physical Activity: Not on file  Stress: Not on file  Social Connections: Unknown (03/11/2022)   Received from [facility]   Social Network    Social Network: Not on file  Intimate Partner Violence: Unknown (03/11/2022)   Received from [facility]   HITS    Physically Hurt: Not on file    Insult or Talk Down To: Not on file    Threaten Physical Harm: Not on file    Scream or Curse: Not on file    Review of Systems:  All other review of systems negative except as mentioned in the HPI.  Physical Exam: Vital signs BP (!) 152/80   Pulse 61   Temp 98.4 F (36.9 C) (Temporal)   Resp 15   Ht 5\' 11"  (1.803 m)   Wt 77.1 kg   SpO2 100%   BMI 23.71 kg/m   General:   Alert,  Well-developed, well-nourished, pleasant and cooperative in NAD Airway:  Mallampati 2 Lungs:  Clear throughout to auscultation.   Heart:  Regular rate and rhythm; no murmurs, clicks, rubs,  or gallops. Abdomen:  Soft, nontender and nondistended. Normal bowel sounds.   Neuro/Psych:  Normal mood and affect. A and O x 3    E. Tomasa Rand, MD [facility]

## 2023-01-10 NOTE — Anesthesia Procedure Notes (Signed)
Procedure Name: MAC Date/Time: 01/10/2023 8:46 AM  Performed by: Vanessa Colorado, CRNAPre-anesthesia Checklist: Patient identified, Emergency Drugs available, Suction available and Patient being monitored Patient Re-evaluated:Patient Re-evaluated prior to induction Oxygen Delivery Method: Simple face mask

## 2023-01-10 NOTE — Op Note (Signed)
[facility] Patient Name: David [name] Procedure Date: 01/10/2023 MRN: [id number] Attending MD: Dub Amis. Tomasa Rand , MD, [id number] Date of Birth: [birth year] CSN: [id number] Age: 57 Admit Type: Outpatient Procedure:                Colonoscopy Indications:              Screening for colorectal malignant neoplasm, This                            is the patient's first colonoscopy Providers:                Lorin Picket E. Tomasa Rand, MD, Carlena Hurl,                            Rozetta Nunnery, Technician Referring MD:              Medicines:                Monitored Anesthesia Care Complications:            No immediate complications. Estimated Blood Loss:     Estimated blood loss was minimal. Procedure:                Pre-Anesthesia Assessment:                           - Prior to the procedure, a History and Physical                            was performed, and patient medications and                            allergies were reviewed. The patient's tolerance of                            previous anesthesia was also reviewed. The risks                            and benefits of the procedure and the sedation                            options and risks were discussed with the patient.                            All questions were answered, and informed consent                            was obtained. Prior Anticoagulants: The patient has                            taken no anticoagulant or antiplatelet agents                            except for aspirin. ASA Grade Assessment: III - A  patient with severe systemic disease. After                            reviewing the risks and benefits, the patient was                            deemed in satisfactory condition to undergo the                            procedure.                           After obtaining informed consent, the colonoscope                            was passed under direct  vision. Throughout the                            procedure, the patient's blood pressure, pulse, and                            oxygen saturations were monitored continuously. The                            CF-HQ190L ([id number]) Olympus colonoscope was                            introduced through the anus and advanced to the the                            terminal ileum, with identification of the                            appendiceal orifice and IC valve. The colonoscopy                            was performed without difficulty. The patient                            tolerated the procedure well. The quality of the                            bowel preparation was adequate. The terminal ileum,                            ileocecal valve, appendiceal orifice, and rectum                            were photographed. Scope In: 8:57:04 AM Scope Out: 9:23:02 AM Scope Withdrawal Time: 0 hours 21 minutes 12 seconds  Total Procedure Duration: 0 hours 25 minutes 58 seconds  Findings:      The perianal and digital rectal examinations were normal. Pertinent       negatives include normal sphincter tone and no palpable rectal lesions.  A 10 mm polyp was found in the hepatic flexure. The polyp was sessile.       The polyp was removed with a cold snare. Resection and retrieval were       complete. Estimated blood loss was minimal.      A 6 mm polyp was found in the transverse colon. The polyp was sessile.       The polyp was removed with a cold snare. Resection and retrieval were       complete. Estimated blood loss was minimal.      A few small-mouthed diverticula were found in the sigmoid colon.      The exam was otherwise normal throughout the examined colon.      The terminal ileum appeared normal.      The retroflexed view of the distal rectum and anal verge was normal and       showed no anal or rectal abnormalities. Impression:               - One 10 mm polyp at the hepatic flexure,  removed                            with a cold snare. Resected and retrieved.                           - One 6 mm polyp in the transverse colon, removed                            with a cold snare. Resected and retrieved.                           - Diverticulosis in the sigmoid colon.                           - The examined portion of the ileum was normal.                           - The distal rectum and anal verge are normal on                            retroflexion view. Moderate Sedation:      Not Applicable - Patient had care per Anesthesia. Recommendation:           - Patient has a contact number available for                            emergencies. The signs and symptoms of potential                            delayed complications were discussed with the                            patient. Return to normal activities tomorrow.                            Written discharge instructions were provided to the  patient.                           - Resume previous diet.                           - Continue present medications.                           - Await pathology results.                           - Repeat colonoscopy (date not yet determined) for                            surveillance based on pathology results. Procedure Code(s):        --- Professional ---                           [phone], Colonoscopy, flexible; with removal of                            tumor(s), polyp(s), or other lesion(s) by snare                            technique Diagnosis Code(s):        --- Professional ---                           Z12.11, Encounter for screening for malignant                            neoplasm of colon                           D12.3, Benign neoplasm of transverse colon (hepatic                            flexure or splenic flexure)                           K57.30, Diverticulosis of large intestine without                            perforation or  abscess without bleeding CPT copyright 2022 American Medical Association. All rights reserved. The codes documented in this report are preliminary and upon coder review may  be revised to meet current compliance requirements.  E. Tomasa Rand, MD 01/10/2023 9:28:54 AM This report has been signed electronically. Number of Addenda: 0

## 2023-01-10 NOTE — Discharge Instructions (Signed)

## 2023-01-15 ENCOUNTER — Encounter (HOSPITAL_COMMUNITY): Payer: Self-pay | Admitting: Gastroenterology

## 2023-01-15 ENCOUNTER — Encounter: Payer: Self-pay | Admitting: Gastroenterology

## 2023-02-10 NOTE — Addendum Note (Signed)
Encounter addended by: Sheanna Dail H on: 02/10/2023 3:40 PM  Actions taken: Imaging Exam ended

## 2023-02-28 MED ORDER — SODIUM CHLORIDE (PF) 0.9 % IJ SOLN
INTRAVENOUS | Status: AC | PRN
  Administered 2022-12-02 (×2): 200 ug via INTRA_ARTERIAL

## 2023-02-28 NOTE — Addendum Note (Signed)
Encounter addended by: Gregary Signs, RN on: 02/28/2023 2:03 PM  Actions taken: MAR administration edited, MAR administration accepted, Orders acknowledged in Narrator, Event accepted in Narrator, One-Step Medication filed, Procedure Event Log accessed

## 2023-03-14 ENCOUNTER — Other Ambulatory Visit: Payer: Self-pay | Admitting: Cardiology

## 2023-03-14 DIAGNOSIS — I420 Dilated cardiomyopathy: Secondary | ICD-10-CM

## 2023-03-29 ENCOUNTER — Other Ambulatory Visit: Payer: Self-pay | Admitting: Cardiology

## 2023-04-10 ENCOUNTER — Ambulatory Visit (HOSPITAL_COMMUNITY)
Admission: EM | Admit: 2023-04-10 | Discharge: 2023-04-10 | Disposition: A | Discharge status: Home / Self Care | Payer: 59 | Attending: Family Medicine | Admitting: Family Medicine

## 2023-04-10 ENCOUNTER — Encounter (HOSPITAL_COMMUNITY): Payer: Self-pay

## 2023-04-10 DIAGNOSIS — R059 Cough, unspecified: Secondary | ICD-10-CM

## 2023-04-10 DIAGNOSIS — J0140 Acute pansinusitis, unspecified: Secondary | ICD-10-CM

## 2023-04-10 DIAGNOSIS — R112 Nausea with vomiting, unspecified: Secondary | ICD-10-CM

## 2023-04-10 MED ORDER — ONDANSETRON HCL 4 MG PO TABS: 4.0000 mg | ORAL_TABLET | Freq: Three times a day (TID) | ORAL | 0 refills | Status: AC | PRN

## 2023-04-10 MED ORDER — BENZONATATE 100 MG PO CAPS: 200.0000 mg | ORAL_CAPSULE | Freq: Three times a day (TID) | ORAL | 0 refills | Status: AC | PRN

## 2023-04-10 MED ORDER — AMOXICILLIN 875 MG PO TABS: 875.0000 mg | ORAL_TABLET | Freq: Two times a day (BID) | ORAL | 0 refills | Status: AC

## 2023-04-10 NOTE — ED Triage Notes (Addendum)
Pt presents with cough, sweats, vomiting, nasal congestion and sore throat x 2 weeks. Pt currently denies nausea, stating "when I get to coughing, I start to throw up." Pt has taken Nyquil and Theraflu OTC to help symptoms, last dose was about three days ago, reports "it did not do any good."

## 2023-04-10 NOTE — ED Notes (Signed)
Brown dressing over pt's left ear, reports surgery on ear but "not recently."

## 2023-04-10 NOTE — Discharge Instructions (Signed)
Take medication as prescribed. If symptoms worsen or do not improve, after completing medication, return for evaluation. Force fluids.

## 2023-04-10 NOTE — ED Provider Notes (Signed)
[facility]    CSN: [id number] Arrival date & time: 04/10/23  1322      History   Chief Complaint Chief Complaint  Patient presents with   Cough   Emesis   Sore Throat    HPI David [name] is a 57 y.o. male.  Complex medical history including cardiomyopathy with an EF of 25%, history of stroke, CHF, CKD 2 Patient present today with a 2-week history of cough, congestion, facial pressure, intermittent nausea with vomitus and sore throat.  Patient has been afebrile.  Patient denies any known sick exposures.  He has been taking over-the-counter NyQuil and other cough and cold medications without any improvement of symptoms.   Past Medical History:  Diagnosis Date   Acute ischemic left MCA stroke (HCC)    a. 08/2019 MRI: Acute L MCA branch vessel infarctions; b. 08/2019 CTA head/neck: Patent bilat carotids/vertebrals. High grade stenosis - inf division of prox L MCA M2 branch. additional high-grade mid-dist superior L MCA M2 branch occlusion. 7mm aneurysm arising from cavernous L ICA.   Cardiomyopathy (HCC)    a. 08/2019 Echo: EF 20-25%, global HK. Gr2 DD. Nl RV fxn. RVSP 30.49mmHg.  Mildly dil LA. Mod MR.   Cerebral aneurysm    a. 08/2019 CTA Head: 7mm aneurysm arising from the cavernous L ICA.   CHF (congestive heart failure) (HCC)    CKD stage G2/A2, GFR 60-89 and albumin creatinine ratio 30-299 mg/g    Facial mass    a. CT head: 7.0 x 2.3 cm soft tissue mass along lat aspect of L temporal bone, involving L ext ear - highly suscpicious for malignancy.   Heart attack (HCC)    Heart murmur    Hypertension    Moderate mitral regurgitation    Nonischemic cardiomyopathy (HCC)    PFO (patent foramen ovale)    Stroke (HCC)    Tobacco abuse     Patient Active Problem List   Diagnosis Date Noted   Benign neoplasm of transverse colon 01/10/2023   Colon cancer screening 01/10/2023   Chronic combined systolic and diastolic CHF (congestive heart failure) (HCC) 12/02/2019    Nonischemic cardiomyopathy (HCC) 12/02/2019   CVA (cerebral vascular accident) (HCC) 12/02/2019   PFO (patent foramen ovale) 12/02/2019   CKD stage G2/A2, GFR 60-89 and albumin creatinine ratio 30-299 mg/g 12/02/2019   Tobacco abuse 12/02/2019   Hypotension 12/02/2019   Syncope 12/02/2019   Cryptogenic stroke (HCC) 11/12/2019   Chronic systolic CHF (congestive heart failure) (HCC) 08/11/2019   Mass of left temporal lobe 08/11/2019   Tobacco abuse 08/10/2019   Acute ischemic stroke (HCC) 08/09/2019   Intussusception of jejunum (HCC) 09/12/2017   Tailor's bunion of both feet 01/06/2017   Onychomycosis 01/06/2017   Foot callus 01/05/2017   Foreign body in foot 01/05/2017    Past Surgical History:  Procedure Laterality Date   BUBBLE STUDY  08/12/2019   Procedure: BUBBLE STUDY;  Surgeon: Parke Poisson, MD;  Location: [facility] ENDOSCOPY;  Service: Cardiology;;   COLONOSCOPY WITH PROPOFOL N/A 01/10/2023   Procedure: COLONOSCOPY WITH PROPOFOL;  Surgeon: Jenel Lucks, MD;  Location: [facility] ENDOSCOPY;  Service: Gastroenterology;  Laterality: N/A;   EXTERNAL EAR SURGERY     IR ANGIO INTRA EXTRACRAN SEL COM CAROTID INNOMINATE BILAT MOD SED  12/02/2022   IR ANGIO VERTEBRAL SEL VERTEBRAL BILAT MOD SED  12/02/2022   IR US GUIDE VASC ACCESS RIGHT  12/02/2022   POLYPECTOMY  01/10/2023   Procedure: POLYPECTOMY;  Surgeon:  Jenel Lucks, MD;  Location: [facility] ENDOSCOPY;  Service: Gastroenterology;;   TEE WITHOUT CARDIOVERSION N/A 08/12/2019   Procedure: TRANSESOPHAGEAL ECHOCARDIOGRAM (TEE);  Surgeon: Parke Poisson, MD;  Location: [facility] ENDOSCOPY;  Service: Cardiology;  Laterality: N/A;       Home Medications    Prior to Admission medications   Medication Sig Start Date End Date Taking? Authorizing Provider  amoxicillin (AMOXIL) 875 MG tablet Take 1 tablet (875 mg total) by mouth 2 (two) times daily. 04/10/23  Yes Bing Neighbors, NP  benzonatate (TESSALON) 100 MG capsule Take 2 capsules (200 mg  total) by mouth 3 (three) times daily as needed for cough. 04/10/23  Yes Bing Neighbors, NP  ondansetron (ZOFRAN) 4 MG tablet Take 1 tablet (4 mg total) by mouth every 8 (eight) hours as needed for nausea or vomiting. 04/10/23  Yes Bing Neighbors, NP  Acetaminophen (TYLENOL PO) Take by mouth.    [provider]  aspirin EC 81 MG tablet Take 1 tablet (81 mg total) by mouth daily. 11/12/19   Marinus Maw, MD  atorvastatin (LIPITOR) 80 MG tablet TAKE 1 TABLET BY MOUTH ONCE DAILY A 6PM 03/29/23   Lewayne Bunting, MD  carvedilol (COREG) 6.25 MG tablet Take 1 tablet by mouth twice daily 03/14/23   Lewayne Bunting, MD  famotidine (PEPCID) 40 MG tablet Take 1 tablet (40 mg total) by mouth every 12 (twelve) hours as needed for heartburn or indigestion. 06/07/22   Jenel Lucks, MD  sacubitril-valsartan (ENTRESTO) 24-26 MG Take 1 tablet by mouth 2 (two) times daily. 12/09/20   Ronney Asters, NP    Family History Family History  Problem Relation Age of Onset   Stroke Sister    Cancer Mother        died @ 63   Other Father        died of CO poisoning as a young man.    Social History Social History   Tobacco Use   Smoking status: Every Day    Current packs/day: 0.25    Average packs/day: 0.3 packs/day for 35.0 years (8.8 ttl pk-yrs)    Types: Cigarettes   Smokeless tobacco: Never  Vaping Use   Vaping status: Never Used  Substance Use Topics   Alcohol use: No   Drug use: Yes    Types: Marijuana    Comment: smokes several blunts on the weekend.     Allergies   Patient has no known allergies.   Review of Systems Review of Systems  Respiratory:  Positive for cough.   Gastrointestinal:  Positive for vomiting.     Physical Exam Triage Vital Signs ED Triage Vitals [04/10/23 1440]  Encounter Vitals Group     BP (!) 143/90     Systolic BP Percentile      Diastolic BP Percentile      Pulse Rate 84     Resp 18     Temp 98.7 F (37.1 C)     Temp Source  Oral     SpO2 97 %     Weight      Height      Head Circumference      Peak Flow      Pain Score 0     Pain Loc      Pain Education      Exclude from Growth Chart    No data found.  Updated Vital Signs BP (!) 143/90 (BP Location: Left Arm)  Pulse 84   Temp 98.7 F (37.1 C) (Oral)   Resp 18   SpO2 97%   Visual Acuity Right Eye Distance:   Left Eye Distance:   Bilateral Distance:    Right Eye Near:   Left Eye Near:    Bilateral Near:     Physical Exam Vitals reviewed.  Constitutional:      Appearance: He is ill-appearing.  HENT:     Head: Normocephalic and atraumatic.     Nose: Congestion and rhinorrhea present.     Mouth/Throat:     Mouth: Mucous membranes are moist.     Tonsils: No tonsillar exudate or tonsillar abscesses.  Eyes:     Conjunctiva/sclera: Conjunctivae normal.     Pupils: Pupils are equal, round, and reactive to light.  Cardiovascular:     Rate and Rhythm: Normal rate and regular rhythm.  Pulmonary:     Effort: Pulmonary effort is normal.     Breath sounds: Normal breath sounds.  Skin:    General: Skin is warm and dry.  Neurological:     General: No focal deficit present.     Mental Status: He is alert.      UC Treatments / Results  Labs (all labs ordered are listed, but only abnormal results are displayed) Labs Reviewed - No data to display  EKG   Radiology No results found.  Procedures Procedures (including critical care time)  Medications Ordered in UC Medications - No data to display  Initial Impression / Assessment and Plan / UC Course  I have reviewed the triage vital signs and the nursing notes.  Pertinent labs & imaging results that were available during my care of the patient were reviewed by me and considered in my medical decision making (see chart for details).    Acute sinusitis, uncomplicated.  Empiric treatment with amoxicillin 875 mg twice daily.  For associated symptoms of nausea and vomiting Zofran 4 mg  every 8 hours as needed.  For cough benzonatate perles 200 mg 3 times daily as needed for cough.  Return precautions given if symptoms worsen or do not improve. Final Clinical Impressions(s) / UC Diagnoses   Final diagnoses:  Acute non-recurrent pansinusitis  Nausea and vomiting, unspecified vomiting type  Cough, unspecified type     Discharge Instructions      Take medication as prescribed. If symptoms worsen or do not improve, after completing medication, return for evaluation. Force fluids.      ED Prescriptions     Medication Sig Dispense Auth. Provider   amoxicillin (AMOXIL) 875 MG tablet Take 1 tablet (875 mg total) by mouth 2 (two) times daily. 20 tablet Bing Neighbors, NP   ondansetron (ZOFRAN) 4 MG tablet Take 1 tablet (4 mg total) by mouth every 8 (eight) hours as needed for nausea or vomiting. 20 tablet Bing Neighbors, NP   benzonatate (TESSALON) 100 MG capsule Take 2 capsules (200 mg total) by mouth 3 (three) times daily as needed for cough. 40 capsule Bing Neighbors, NP      PDMP not reviewed this encounter.   Bing Neighbors, NP 04/10/23 [phone]

## 2023-05-26 ENCOUNTER — Other Ambulatory Visit (HOSPITAL_COMMUNITY): Payer: Self-pay

## 2023-05-26 ENCOUNTER — Telehealth: Payer: Self-pay | Admitting: Cardiology

## 2023-05-26 MED ORDER — ENTRESTO 24-26 MG PO TABS
1.0000 | ORAL_TABLET | Freq: Two times a day (BID) | ORAL | 1 refills | Status: DC
Start: 2023-05-26 — End: 2023-06-30
  Filled 2023-05-26: qty 60, 30d supply, fill #0

## 2023-05-26 NOTE — Telephone Encounter (Signed)
*  STAT* If patient is at the pharmacy, call can be transferred to refill team.   1. Which medications need to be refilled? (please list name of each medication and dose if known)   sacubitril-valsartan (ENTRESTO) 24-26 MG   2. Would you like to learn more about the convenience, safety, & potential cost savings by using the [facility]?   3. Are you open to using the [facility] (Type [facility]. ).   4. Which pharmacy/location (including street and city if local pharmacy) is medication to be sent to?  5. Do they need a 30 day or 90 day supply?   90 day  Caller ([name]) stated patient gets his medication from [facility] and patient will be out of this medication by Thursday, 12/26.

## 2023-05-26 NOTE — Telephone Encounter (Signed)
Med sent.

## 2023-05-27 ENCOUNTER — Other Ambulatory Visit (HOSPITAL_COMMUNITY): Payer: Self-pay

## 2023-05-29 ENCOUNTER — Telehealth: Payer: Self-pay | Admitting: Cardiology

## 2023-05-29 NOTE — Telephone Encounter (Signed)
Patient identification verified by 2 forms. Marilynn Rail, RN    Called and spoke to patients fiance [name]  [name] states:   -patient gets medication from [facility]   -[facility], patient can't afford   -does not remember filling out patient assistance application this year   -not sure if received any notification in the mail  Informed [name]:   -no recent documentation showing application was completed in office   -RN will print out copy of application for re-enrollment, can pick up at front desk  [name] agrees with plan, no questions at this time

## 2023-05-29 NOTE — Telephone Encounter (Signed)
Pt c/o medication issue:  1. Name of Medication:  sacubitril-valsartan (ENTRESTO) 24-26 MG   2. How are you currently taking this medication (dosage and times per day)? As prescribed  3. Are you having a reaction (difficulty breathing--STAT)? No   4. What is your medication issue? Patient's significant other is calling to check on the status of this medication being sent to [employer] to be received by him via mail. She reports he will be out of the medication on 12/26, and they cannot afford the cost of it at [facility] pharmacy. She would like a callback with an update. Please advise.

## 2023-05-30 ENCOUNTER — Telehealth: Payer: Self-pay | Admitting: Cardiology

## 2023-05-30 ENCOUNTER — Other Ambulatory Visit: Payer: Self-pay

## 2023-05-30 MED ORDER — SACUBITRIL-VALSARTAN 24-26 MG PO TABS: 1.0000 | ORAL_TABLET | Freq: Two times a day (BID) | ORAL | 0 refills | Status: DC

## 2023-05-30 NOTE — Telephone Encounter (Signed)
Paper Work Dropped Off: [employer]  Date: 05/30/2023  Location of paper:  Provider Mailbox

## 2023-05-30 NOTE — Telephone Encounter (Signed)
Patient calling the office for samples of medication:   1.  What medication and dosage are you requesting samples for?  sacubitril-valsartan (ENTRESTO) 24-26 MG  Take 1 tablet by mouth 2 (two) times daily.   2.  Are you currently out of this medication?   Yes

## 2023-05-30 NOTE — Telephone Encounter (Signed)
Patient identification verified by 2 forms. DPR verified for VMs Shade Flood, RN     Confirmed pt has not received samples this year and has not completed a patient assistance app this year either. Patient assistance form left for pt at front desk by another triage nurse. See 12/23 encounter note.  Put samples of entresto 24-26 at front desk for patient today 12/24. Left detailed message regarding pick up and office holiday hours.

## 2023-06-01 NOTE — Telephone Encounter (Signed)
 Entresto patient assistance application faxed to [employer].

## 2023-06-02 NOTE — Telephone Encounter (Signed)
Spoke with pt, he is aware [employer] needs proof of income for the application. He reports he will bring a check stub by the office next week.

## 2023-06-05 ENCOUNTER — Telehealth: Payer: Self-pay | Admitting: Cardiology

## 2023-06-05 NOTE — Telephone Encounter (Signed)
Paper Work Dropped Off: Check stubs for patient assistants  Date:06/05/23  Location of paper:  Dr. Waunita Schooner Box

## 2023-06-15 ENCOUNTER — Other Ambulatory Visit: Payer: Self-pay | Admitting: Pharmacist Clinician (PhC)/ Clinical Pharmacy Specialist

## 2023-06-15 MED ORDER — SACUBITRIL-VALSARTAN 24-26 MG PO TABS: 1.0000 | ORAL_TABLET | Freq: Two times a day (BID) | ORAL | 0 refills | Status: AC

## 2023-06-16 ENCOUNTER — Other Ambulatory Visit (HOSPITAL_COMMUNITY): Payer: Self-pay

## 2023-06-27 ENCOUNTER — Telehealth: Payer: Self-pay | Admitting: Cardiology

## 2023-06-27 NOTE — Telephone Encounter (Signed)
Pt received text from [employer] that he is longer eligibility for patient assistance for sacubitril-valsartan (ENTRESTO) 24-26 MG  Please advise

## 2023-06-27 NOTE — Telephone Encounter (Signed)
Called and spoke to patient's fiance. Verified name and DOB. Informed her patient was denied for patient assistance because he has Nurse, learning disability and he does not qualify. I informed her that we have a copay card he could use. She began to yell that patient was going to have to change medications and she was not told that in the beginning. I again tried to tell her that since he has Nurse, learning disability he did not qualify. Advised her using the Copay card was probably going to save him more out of pocket. I told her I would leave the Copay card up front if they would like to come pick it up. The line disconnected.

## 2023-06-30 ENCOUNTER — Telehealth: Payer: Self-pay | Admitting: Cardiology

## 2023-06-30 MED ORDER — ENTRESTO 24-26 MG PO TABS: 1.0000 | ORAL_TABLET | Freq: Two times a day (BID) | ORAL | 1 refills | Status: DC

## 2023-06-30 NOTE — Telephone Encounter (Signed)
Pt's medication was sent to pt's pharmacy as requested. Confirmation received.

## 2023-06-30 NOTE — Telephone Encounter (Signed)
*  STAT* If patient is at the pharmacy, call can be transferred to refill team.   1. Which medications need to be refilled? (please list name of each medication and dose if known) new prescription for Entresto   2. Would you like to learn more about the convenience, safety, & potential cost savings by using the [facility]?      3. Are you open to using the [facility] (Type [facility].    4. Which pharmacy/location (including street and city if local pharmacy) is medication to be sent to?[facility] [address], [place]   5. Do they need a 30 day or 90 day supply? 90 days and  refills- please call in today- out of medicine

## 2023-08-21 ENCOUNTER — Other Ambulatory Visit: Payer: Self-pay | Admitting: Cardiology

## 2023-08-21 DIAGNOSIS — I420 Dilated cardiomyopathy: Secondary | ICD-10-CM

## 2023-08-21 NOTE — Progress Notes (Deleted)
 HPI:  Follow-up nonischemic cardiomyopathy and chronic systolic congestive heart failure.  Patient admitted March 2021 with CVA. CTA of his head and neck showed high-grade stenosis in the inferior division of the proximal left MCA M2 branch and high-grade stenosis in the mid to distal posterior left MCA M2 branch.  A 7 mm aneurysm arising from his left ICA was also noted.  An MRI showed acute left MCA branch vessel infarctions consistent with embolic stroke.  Echocardiogram at that time showed ejection fraction 20 to 25%, grade 2 diastolic dysfunction and moderate mitral regurgitation.  Patient was treated medically.  TEE showed ejection fraction 20 to 25%, patent foramen ovale.  Cardiac MRI March 2021 showed ejection fraction 20%, mild mitral and tricuspid regurgitation.  Findings felt suspicious for ischemic cardiomyopathy.  It was felt that thrombus may have been the cause of his CVA and he was anticoagulated. Cardiac CTA March 2021 showed calcium score of 0 and no coronary disease.  Echocardiogram June 2024 showed ejection fraction 45%, mild left ventricular enlargement, mild left ventricular hypertrophy, mild mitral regurgitation.  CTA June 2024 showed patent left MCA circulation, occluded left vertebral artery.  Since last seen    Current Outpatient Medications  Medication Sig Dispense Refill   Acetaminophen (TYLENOL PO) Take by mouth.     amoxicillin (AMOXIL) 875 MG tablet Take 1 tablet (875 mg total) by mouth 2 (two) times daily. 20 tablet 0   aspirin EC 81 MG tablet Take 1 tablet (81 mg total) by mouth daily. 90 tablet 3   atorvastatin (LIPITOR) 80 MG tablet TAKE 1 TABLET BY MOUTH ONCE DAILY A 6PM 30 tablet 6   benzonatate (TESSALON) 100 MG capsule Take 2 capsules (200 mg total) by mouth 3 (three) times daily as needed for cough. 40 capsule 0   carvedilol (COREG) 6.25 MG tablet Take 1 tablet by mouth twice daily 60 tablet 4   famotidine (PEPCID) 40 MG tablet Take 1 tablet (40 mg total) by  mouth every 12 (twelve) hours as needed for heartburn or indigestion. 30 tablet 3   ondansetron (ZOFRAN) 4 MG tablet Take 1 tablet (4 mg total) by mouth every 8 (eight) hours as needed for nausea or vomiting. 20 tablet 0   sacubitril-valsartan (ENTRESTO) 24-26 MG Take 1 tablet by mouth 2 (two) times daily. 28 tablet 0   sacubitril-valsartan (ENTRESTO) 24-26 MG Take 1 tablet by mouth 2 (two) times daily. 180 tablet 1   No current facility-administered medications for this visit.   Facility-Administered Medications Ordered in Other Visits  Medication Dose Route Frequency Provider Last Rate Last Admin   nitroGLYCERIN 200 mcg in sodium chloride (PF) 0.9 % 59.74 mL (25 mcg/mL) syringe   Intra-arterial PRN Julieanne Cotton, MD   200 mcg at 12/02/22 1410     Past Medical History:  Diagnosis Date   Acute ischemic left MCA stroke (HCC)    a. 08/2019 MRI: Acute L MCA branch vessel infarctions; b. 08/2019 CTA head/neck: Patent bilat carotids/vertebrals. High grade stenosis - inf division of prox L MCA M2 branch. additional high-grade mid-dist superior L MCA M2 branch occlusion. 7mm aneurysm arising from cavernous L ICA.   Cardiomyopathy (HCC)    a. 08/2019 Echo: EF 20-25%, global HK. Gr2 DD. Nl RV fxn. RVSP 30.36mmHg.  Mildly dil LA. Mod MR.   Cerebral aneurysm    a. 08/2019 CTA Head: 7mm aneurysm arising from the cavernous L ICA.   CHF (congestive heart failure) (HCC)    CKD  stage G2/A2, GFR 60-89 and albumin creatinine ratio 30-299 mg/g    Facial mass    a. CT head: 7.0 x 2.3 cm soft tissue mass along lat aspect of L temporal bone, involving L ext ear - highly suscpicious for malignancy.   Heart attack (HCC)    Heart murmur    Hypertension    Moderate mitral regurgitation    Nonischemic cardiomyopathy (HCC)    PFO (patent foramen ovale)    Stroke (HCC)    Tobacco abuse     Past Surgical History:  Procedure Laterality Date   BUBBLE STUDY  08/12/2019   Procedure: BUBBLE STUDY;  Surgeon:  Parke Poisson, MD;  Location: [facility] ENDOSCOPY;  Service: Cardiology;;   COLONOSCOPY WITH PROPOFOL N/A 01/10/2023   Procedure: COLONOSCOPY WITH PROPOFOL;  Surgeon: Jenel Lucks, MD;  Location: [facility] ENDOSCOPY;  Service: Gastroenterology;  Laterality: N/A;   EXTERNAL EAR SURGERY     IR ANGIO INTRA EXTRACRAN SEL COM CAROTID INNOMINATE BILAT MOD SED  12/02/2022   IR ANGIO VERTEBRAL SEL VERTEBRAL BILAT MOD SED  12/02/2022   IR US GUIDE VASC ACCESS RIGHT  12/02/2022   POLYPECTOMY  01/10/2023   Procedure: POLYPECTOMY;  Surgeon: Jenel Lucks, MD;  Location: [facility] ENDOSCOPY;  Service: Gastroenterology;;   TEE WITHOUT CARDIOVERSION N/A 08/12/2019   Procedure: TRANSESOPHAGEAL ECHOCARDIOGRAM (TEE);  Surgeon: Parke Poisson, MD;  Location: [facility] ENDOSCOPY;  Service: Cardiology;  Laterality: N/A;    Social History   Socioeconomic History   Marital status: Single    Spouse name: Not on file   Number of children: 6   Years of education: Not on file   Highest education level: Not on file  Occupational History   Not on file  Tobacco Use   Smoking status: Every Day    Current packs/day: 0.25    Average packs/day: 0.3 packs/day for 35.0 years (8.8 ttl pk-yrs)    Types: Cigarettes   Smokeless tobacco: Never  Vaping Use   Vaping status: Never Used  Substance and Sexual Activity   Alcohol use: No   Drug use: Yes    Types: Marijuana    Comment: smokes several blunts on the weekend.   Sexual activity: Yes  Other Topics Concern   Not on file  Social History Narrative   ** Merged History Encounter **       Lives in [place] w/ fiancee.  Does not routinely exercise but has very busy work-life, working M-F 12 hrs shifts and another 8 hrs on Saturdays.  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   Social Drivers of Corporate investment banker Strain: Not on file  Food Insecurity: No Food Insecurity (03/28/2022)   Received from [facility], [facility]   Hunger Vital Sign     Worried About Running Out of Food in the Last Year: Never true    Ran Out of Food in the Last Year: Never true  Transportation Needs: Not on file  Physical Activity: Not on file  Stress: Not on file  Social Connections: Unknown (03/11/2022)   Received from [facility], [facility]   Social Network    Social Network: Not on file  Intimate Partner Violence: Unknown (03/11/2022)   Received from [employer], [facility]   HITS    Physically Hurt: Not on file    Insult or Talk Down To: Not on file    Threaten Physical Harm: Not on file    Scream or  Curse: Not on file    Family History  Problem Relation Age of Onset   Stroke Sister    Cancer Mother        died @ 88   Other Father        died of CO poisoning as a young man.    ROS: no fevers or chills, productive cough, hemoptysis, dysphasia, odynophagia, melena, hematochezia, dysuria, hematuria, rash, seizure activity, orthopnea, PND, pedal edema, claudication. Remaining systems are negative.  Physical Exam: Well-developed well-nourished in no acute distress.  Skin is warm and dry.  HEENT is normal.  Neck is supple.  Chest is clear to auscultation with normal expansion.  Cardiovascular exam is regular rate and rhythm.  Abdominal exam nontender or distended. No masses palpated. Extremities show no edema. neuro grossly intact  ECG- personally reviewed  A/P  1 nonischemic cardiomyopathy-continue present dose of Entresto and carvedilol.  Patient did not tolerate Farxiga or spironolactone.  LV function mildly reduced on most recent echocardiogram.  2 hypertension-blood pressure controlled.  Continue present medical regimen.  3 hyperlipidemia-continue statin.  4 history of syncope-no recurrent episodes and previous episode felt likely orthostatic mediated.  5 history of CVA-continue aspirin and statin.  6 tobacco abuse-patient has been counseled on discontinuing.  7 history of aneurysm of the cavernous left internal  carotid artery-followed by interventional radiology.  Olga Millers, MD

## 2023-08-30 ENCOUNTER — Ambulatory Visit: Payer: 59 | Admitting: Cardiology

## 2023-09-14 ENCOUNTER — Emergency Department (HOSPITAL_COMMUNITY)
Admission: EM | Admit: 2023-09-14 | Discharge: 2023-09-14 | Disposition: A | Discharge status: Home / Self Care | Attending: Emergency Medicine | Admitting: Emergency Medicine

## 2023-09-14 ENCOUNTER — Other Ambulatory Visit: Payer: Self-pay

## 2023-09-14 ENCOUNTER — Emergency Department (HOSPITAL_COMMUNITY)

## 2023-09-14 ENCOUNTER — Encounter (HOSPITAL_COMMUNITY): Payer: Self-pay | Admitting: Emergency Medicine

## 2023-09-14 DIAGNOSIS — Z7982 Long term (current) use of aspirin: Secondary | ICD-10-CM | POA: Insufficient documentation

## 2023-09-14 DIAGNOSIS — N50812 Left testicular pain: Secondary | ICD-10-CM | POA: Diagnosis present

## 2023-09-14 DIAGNOSIS — N451 Epididymitis: Secondary | ICD-10-CM | POA: Insufficient documentation

## 2023-09-14 LAB — URINALYSIS, ROUTINE W REFLEX MICROSCOPIC
Bilirubin Urine: NEGATIVE
Glucose, UA: NEGATIVE mg/dL
Ketones, ur: 20 mg/dL — AB
Nitrite: NEGATIVE
Protein, ur: 30 mg/dL — AB
Specific Gravity, Urine: 1.025 (ref 1.005–1.030)
WBC, UA: >50 WBC/hpf (ref 0–5)
pH: 5 (ref 5.0–8.0)

## 2023-09-14 MED ORDER — DOXYCYCLINE HYCLATE 100 MG PO CAPS: 100.0000 mg | ORAL_CAPSULE | Freq: Two times a day (BID) | ORAL | 0 refills | Status: AC

## 2023-09-14 MED ORDER — OXYCODONE-ACETAMINOPHEN 5-325 MG PO TABS
1.0000 | ORAL_TABLET | Freq: Once | ORAL | Status: AC
  Administered 2023-09-14: 1 via ORAL
  Filled 2023-09-14: qty 1

## 2023-09-14 MED ORDER — DOXYCYCLINE HYCLATE 100 MG PO TABS
100.0000 mg | ORAL_TABLET | Freq: Once | ORAL | Status: AC
  Administered 2023-09-14: 100 mg via ORAL
  Filled 2023-09-14: qty 1

## 2023-09-14 NOTE — ED Provider Notes (Signed)
 [place] EMERGENCY DEPARTMENT AT [facility] Provider Note   CSN: [id number] Arrival date & time: 09/14/23  1603     History {Add pertinent medical, surgical, social history, OB history to HPI:1} Chief Complaint  Patient presents with   Groin Swelling    David [name] is a 58 y.o. male.  The history is provided by the patient and medical records.   58 year old male presenting to the ED with left testicle pain and swelling.  States woke up with this yesterday morning.  He denies any injury or trauma to the groin.  He does report some white penile discharge and some hesitancy with urination.  Does report a little bit of hematuria as well.  Not currently sexually active.  Denies any concern for STD.  Home Medications Prior to Admission medications   Medication Sig Start Date End Date Taking? Authorizing Provider  Acetaminophen (TYLENOL PO) Take by mouth.    [provider]  amoxicillin (AMOXIL) 875 MG tablet Take 1 tablet (875 mg total) by mouth 2 (two) times daily. 04/10/23   Bing Neighbors, NP  aspirin EC 81 MG tablet Take 1 tablet (81 mg total) by mouth daily. 11/12/19   Marinus Maw, MD  atorvastatin (LIPITOR) 80 MG tablet TAKE 1 TABLET BY MOUTH ONCE DAILY A 6PM 03/29/23   Lewayne Bunting, MD  benzonatate (TESSALON) 100 MG capsule Take 2 capsules (200 mg total) by mouth 3 (three) times daily as needed for cough. 04/10/23   Bing Neighbors, NP  carvedilol (COREG) 6.25 MG tablet Take 1 tablet by mouth twice daily 08/22/23   Lewayne Bunting, MD  famotidine (PEPCID) 40 MG tablet Take 1 tablet (40 mg total) by mouth every 12 (twelve) hours as needed for heartburn or indigestion. 06/07/22   Jenel Lucks, MD  ondansetron (ZOFRAN) 4 MG tablet Take 1 tablet (4 mg total) by mouth every 8 (eight) hours as needed for nausea or vomiting. 04/10/23   Bing Neighbors, NP  sacubitril-valsartan (ENTRESTO) 24-26 MG Take 1 tablet by mouth 2 (two) times daily.  06/15/23   Lewayne Bunting, MD  sacubitril-valsartan (ENTRESTO) 24-26 MG Take 1 tablet by mouth 2 (two) times daily. 06/30/23   Lewayne Bunting, MD      Allergies    Patient has no known allergies.    Review of Systems   Review of Systems  Genitourinary:  Positive for hematuria, scrotal swelling and testicular pain.  All other systems reviewed and are negative.   Physical Exam Updated Vital Signs BP 133/72   Pulse 79   Temp 99 F (37.2 C) (Oral)   Resp 16   Ht 5\' 11"  (1.803 m)   Wt 77 kg   SpO2 98%   BMI 23.68 kg/m   Physical Exam Vitals and nursing note reviewed.  Constitutional:      Appearance: He is well-developed.  HENT:     Head: Normocephalic and atraumatic.  Eyes:     Conjunctiva/sclera: Conjunctivae normal.     Pupils: Pupils are equal, round, and reactive to light.  Cardiovascular:     Rate and Rhythm: Normal rate and regular rhythm.     Heart sounds: Normal heart sounds.  Pulmonary:     Effort: Pulmonary effort is normal.     Breath sounds: Normal breath sounds.  Abdominal:     General: Bowel sounds are normal.     Palpations: Abdomen is soft.  Musculoskeletal:  General: Normal range of motion.     Cervical back: Normal range of motion.  Skin:    General: Skin is warm and dry.  Neurological:     Mental Status: He is alert and oriented to person, place, and time.     ED Results / Procedures / Treatments   Labs (all labs ordered are listed, but only abnormal results are displayed) Labs Reviewed  URINALYSIS, ROUTINE W REFLEX MICROSCOPIC - Abnormal; Notable for the following components:      Result Value   Color, Urine AMBER (*)    APPearance HAZY (*)    Hgb urine dipstick MODERATE (*)    Ketones, ur 20 (*)    Protein, ur 30 (*)    Leukocytes,Ua LARGE (*)    Bacteria, UA RARE (*)    All other components within normal limits    EKG None  Radiology US SCROTUM W/DOPPLER Result Date: 09/14/2023 CLINICAL DATA:  Left testicle pain and  swelling. EXAM: SCROTAL ULTRASOUND DOPPLER ULTRASOUND OF THE TESTICLES TECHNIQUE: Complete ultrasound examination of the testicles, epididymis, and other scrotal structures was performed. Color and spectral Doppler ultrasound were also utilized to evaluate blood flow to the testicles. COMPARISON:  07/29/2021. FINDINGS: Right testicle Measurements: 5.0 x 1.8 x 3.4 cm. No mass or microlithiasis visualized. Left testicle Measurements: 4.6 x 2.3 x 3.6 cm. No mass or microlithiasis visualized. Right epididymis: Epididymal head cyst measuring 0.8 x 0.6 x 0.7 cm. Left epididymis: Epididymal head cyst measuring 0.8 x 0.4 x 0.6 cm. The epididymis is hypervascular. Hydrocele:  A small hydrocele is noted on the left. Varicocele:  None visualized. Pulsed Doppler interrogation of both testes demonstrates normal low resistance arterial and venous waveforms bilaterally. IMPRESSION: 1. No evidence of testicular torsion. 2. Findings compatible with epididymitis on the left. 3. Small left hydrocele. 4. Bilateral epididymal head cysts. Electronically Signed   By: Thornell Sartorius M.D.   On: 09/14/2023 20:30    Procedures Procedures  {Document cardiac monitor, telemetry assessment procedure when appropriate:1}  Medications Ordered in ED Medications  doxycycline (VIBRA-TABS) tablet 100 mg (has no administration in time range)  oxyCODONE-acetaminophen (PERCOCET/ROXICET) 5-325 MG per tablet 1 tablet (1 tablet Oral Given 09/14/23 1722)    ED Course/ Medical Decision Making/ A&P   {   Click here for ABCD2, HEART and other calculatorsREFRESH Note before signing :1}                              Medical Decision Making    Final Clinical Impression(s) / ED Diagnoses Final diagnoses:  None    Rx / DC Orders ED Discharge Orders     None

## 2023-09-14 NOTE — ED Notes (Signed)
 Called Lab they said Cytology would add on GC/Chlam

## 2023-09-14 NOTE — ED Provider Triage Note (Signed)
 Emergency Medicine Provider Triage Evaluation Note  David [name] , a 58 y.o. male  was evaluated in triage.  Pt complains of testicle pain and swelling. Patient reports left testicle pain and swelling starting yesterday. No pain with urination or penile discharge. Not sexually active for extended period of time. Denies concerns for STIs.  Review of Systems  Positive: As above Negative: As above  Physical Exam  BP 138/80   Pulse 89   Temp 99.6 F (37.6 C)   Resp 16   Ht 5\' 11"  (1.803 m)   Wt 77 kg   SpO2 99%   BMI 23.68 kg/m  Gen:   Awake, no distress   Resp:  Normal effort  MSK:   Moves extremities without difficulty  Other:  Left testicle notably swollen with TTP along the posterior testicle following the epididymis.  Medical Decision Making  Medically screening exam initiated at 5:20 PM.  Appropriate orders placed.  David [name] was informed that the remainder of the evaluation will be completed by another provider, this initial triage assessment does not replace that evaluation, and the importance of remaining in the ED until their evaluation is complete.     Smitty Knudsen, PA-C 09/14/23 1720

## 2023-09-14 NOTE — ED Triage Notes (Signed)
 Pt reports left testicle swelling since yesterday. Normal urination. Possible discharge.

## 2023-09-14 NOTE — Discharge Instructions (Signed)
 Take the prescribed medication as directed. Follow-up with urology if symptoms persist or are not improving in the next few days. Can also see your primary care doctor in the interim. Return to the ED for new or worsening symptoms.

## 2023-09-17 LAB — GC/CHLAMYDIA PROBE AMP (~~LOC~~) NOT AT ARMC
Chlamydia: NEGATIVE
Comment: NEGATIVE
Comment: NORMAL
Neisseria Gonorrhea: NEGATIVE

## 2023-09-18 ENCOUNTER — Telehealth: Payer: Self-pay | Admitting: Cardiology

## 2023-09-18 NOTE — Telephone Encounter (Signed)
 STAT if patient feels like he/she is going to faint   1. Are you feeling dizzy, lightheaded, or faint right now?   Yes  2. Have you passed out?  No (If yes move to .SYNCOPECHMG)  3. Do you have any other symptoms?   Lightheaded  4. Have you checked your HR and BP (record if available)? Not available   Caller ([name]) is concerned patient has been having dizzy spells off and on.

## 2023-09-18 NOTE — Telephone Encounter (Signed)
 Spoke with tyra, she reports the patient is having dizziness when sitting from lying and standing from sitting. She reports this has been going on the last couple days. He was recently seen in the ER and his blood pressure was 133. They have a blood pressure machine and will try to find the cuff so they can check his blood pressure for us . He is currently on an antibiotic for groin swelling. His carvedilol and entresto doses are correct. Aware dr Audery Blazing is not in the office but will forward for his review.

## 2023-09-21 NOTE — Telephone Encounter (Signed)
 Left detailed message for tyra for patient to stop the carvedilol. They are to call with questions.

## 2023-09-30 ENCOUNTER — Emergency Department (HOSPITAL_COMMUNITY)
Admission: EM | Admit: 2023-09-30 | Discharge: 2023-09-30 | Disposition: A | Discharge status: Home / Self Care | Attending: Emergency Medicine | Admitting: Emergency Medicine

## 2023-09-30 ENCOUNTER — Encounter (HOSPITAL_COMMUNITY): Payer: Self-pay | Admitting: Emergency Medicine

## 2023-09-30 ENCOUNTER — Other Ambulatory Visit: Payer: Self-pay

## 2023-09-30 DIAGNOSIS — I130 Hypertensive heart and chronic kidney disease with heart failure and stage 1 through stage 4 chronic kidney disease, or unspecified chronic kidney disease: Secondary | ICD-10-CM | POA: Insufficient documentation

## 2023-09-30 DIAGNOSIS — I2510 Atherosclerotic heart disease of native coronary artery without angina pectoris: Secondary | ICD-10-CM | POA: Diagnosis not present

## 2023-09-30 DIAGNOSIS — Z79899 Other long term (current) drug therapy: Secondary | ICD-10-CM | POA: Diagnosis not present

## 2023-09-30 DIAGNOSIS — I509 Heart failure, unspecified: Secondary | ICD-10-CM | POA: Insufficient documentation

## 2023-09-30 DIAGNOSIS — Z7982 Long term (current) use of aspirin: Secondary | ICD-10-CM | POA: Diagnosis not present

## 2023-09-30 DIAGNOSIS — N5089 Other specified disorders of the male genital organs: Secondary | ICD-10-CM | POA: Diagnosis present

## 2023-09-30 DIAGNOSIS — N189 Chronic kidney disease, unspecified: Secondary | ICD-10-CM | POA: Insufficient documentation

## 2023-09-30 DIAGNOSIS — Z8673 Personal history of transient ischemic attack (TIA), and cerebral infarction without residual deficits: Secondary | ICD-10-CM | POA: Diagnosis not present

## 2023-09-30 LAB — URINALYSIS, ROUTINE W REFLEX MICROSCOPIC
Bacteria, UA: NONE SEEN
Bilirubin Urine: NEGATIVE
Glucose, UA: NEGATIVE mg/dL
Ketones, ur: NEGATIVE mg/dL
Leukocytes,Ua: NEGATIVE
Nitrite: NEGATIVE
Protein, ur: NEGATIVE mg/dL
Specific Gravity, Urine: 1.023 (ref 1.005–1.030)
pH: 5 (ref 5.0–8.0)

## 2023-09-30 NOTE — ED Triage Notes (Signed)
 Pt here POV c/o left testicular pain about 2 weeks ago. Was given doxy and finished treatment. Pt states left testicle is swollen. Did get better after antibiotics. Pt denies any urinary symptoms.

## 2023-09-30 NOTE — ED Provider Notes (Signed)
 [place] EMERGENCY DEPARTMENT AT [facility] Provider Note   CSN: [id number] Arrival date & time: 09/30/23  6578     History  Chief Complaint  Patient presents with   Testicle Pain    David [name] is a 58 y.o. male.  HPI Patient presents for left testicle swelling.  Medical history includes CVA, CHF, CKD, HTN, CAD.  He was seen in the ED 2 weeks ago for left testicular pain and swelling.  Ultrasound showed findings consistent with epididymitis.  He was treated with doxycycline .  Since his prior visit, he has had resolution of left testicular pain.  He has some residual swelling of his left testicle but states that the swelling has improved.  He describes a tingling/vibration sensation when he urinates.  He denies any other recent symptoms.    Home Medications Prior to Admission medications   Medication Sig Start Date End Date Taking? Authorizing Provider  Acetaminophen  (TYLENOL  PO) Take by mouth.    [provider]  amoxicillin  (AMOXIL ) 875 MG tablet Take 1 tablet (875 mg total) by mouth 2 (two) times daily. 04/10/23   Buena Carmine, NP  aspirin  EC 81 MG tablet Take 1 tablet (81 mg total) by mouth daily. 11/12/19   Tammie Fall, MD  atorvastatin  (LIPITOR ) 80 MG tablet TAKE 1 TABLET BY MOUTH ONCE DAILY A 6PM 03/29/23   Lenise Quince, MD  benzonatate  (TESSALON ) 100 MG capsule Take 2 capsules (200 mg total) by mouth 3 (three) times daily as needed for cough. 04/10/23   Buena Carmine, NP  doxycycline  (VIBRAMYCIN ) 100 MG capsule Take 1 capsule (100 mg total) by mouth 2 (two) times daily. 09/14/23   Coretha Dew, PA-C  famotidine  (PEPCID ) 40 MG tablet Take 1 tablet (40 mg total) by mouth every 12 (twelve) hours as needed for heartburn or indigestion. 06/07/22   Elois Hair, MD  ondansetron  (ZOFRAN ) 4 MG tablet Take 1 tablet (4 mg total) by mouth every 8 (eight) hours as needed for nausea or vomiting. 04/10/23   Buena Carmine, NP   sacubitril -valsartan  (ENTRESTO ) 24-26 MG Take 1 tablet by mouth 2 (two) times daily. 06/15/23   Lenise Quince, MD  sacubitril -valsartan  (ENTRESTO ) 24-26 MG Take 1 tablet by mouth 2 (two) times daily. 06/30/23   Lenise Quince, MD      Allergies    Patient has no known allergies.    Review of Systems   Review of Systems  Genitourinary:  Positive for scrotal swelling.  All other systems reviewed and are negative.   Physical Exam Updated Vital Signs BP (!) 146/94   Pulse 73   Temp 97.8 F (36.6 C) (Oral)   Resp 16   Ht 5\' 11"  (1.803 m)   Wt 77.1 kg   SpO2 99%   BMI 23.71 kg/m  Physical Exam Vitals and nursing note reviewed. Exam conducted with a chaperone present.  Constitutional:      General: He is not in acute distress.    Appearance: Normal appearance. He is well-developed. He is not ill-appearing, toxic-appearing or diaphoretic.  HENT:     Head: Normocephalic and atraumatic.     Right Ear: External ear normal.     Left Ear: External ear normal.     Nose: Nose normal.     Mouth/Throat:     Mouth: Mucous membranes are moist.  Eyes:     Extraocular Movements: Extraocular movements intact.     Conjunctiva/sclera: Conjunctivae normal.  Cardiovascular:     Rate and Rhythm: Normal rate and regular rhythm.  Pulmonary:     Effort: Pulmonary effort is normal. No respiratory distress.  Abdominal:     General: There is no distension.     Palpations: Abdomen is soft.  Genitourinary:    Testes:        Right: Tenderness or swelling not present.        Left: Swelling present. Tenderness not present.  Musculoskeletal:        General: No swelling.     Cervical back: Normal range of motion and neck supple.  Skin:    General: Skin is warm and dry.     Capillary Refill: Capillary refill takes less than 2 seconds.  Neurological:     General: No focal deficit present.     Mental Status: He is alert and oriented to person, place, and time.  Psychiatric:        Mood and  Affect: Mood normal.        Behavior: Behavior normal.     ED Results / Procedures / Treatments   Labs (all labs ordered are listed, but only abnormal results are displayed) Labs Reviewed  URINALYSIS, ROUTINE W REFLEX MICROSCOPIC - Abnormal; Notable for the following components:      Result Value   Hgb urine dipstick SMALL (*)    All other components within normal limits  URINE CULTURE    EKG None  Radiology No results found.  Procedures Procedures    Medications Ordered in ED Medications - No data to display  ED Course/ Medical Decision Making/ A&P                                 Medical Decision Making Amount and/or Complexity of Data Reviewed Labs: ordered.   Patient presenting for residual left testicular swelling following a recent diagnosis of epididymitis.  He was treated with doxycycline , which he states that he completed.  He has had resolution of left testicular pain.  On arrival in the ED, he is well-appearing.  On exam, left testicle is mildly swollen compared to the right.  No tenderness is present.  Per chart review, he had negative GC and Chlamydia testing during his prior visit.  His urinalysis did show pyuria and rare bacteria.  Will recheck a urinalysis today and send for urine culture.  Given his resolution of pain and improved swelling, I do suspect that antibiotics were effective.  Patient would benefit from urology follow-up given his ongoing concerns.  Urinalysis today shows no evidence of infection.  Patient is stable for discharge.        Final Clinical Impression(s) / ED Diagnoses Final diagnoses:  Testicular swelling, left    Rx / DC Orders ED Discharge Orders     None         Iva Mariner, MD 09/30/23 [phone]

## 2023-09-30 NOTE — Discharge Instructions (Signed)
 The fact that your testicle pain has resolved and the swelling has improved are reassuring.  I suspect that the antibiotics you were given have been effective.  Your urine today did not show any evidence of infection.  Call the telephone number below to set up a follow-up appointment with a urologist for further discussion of residual swelling or any other concerns.

## 2023-10-01 LAB — URINE CULTURE: Culture: NO GROWTH

## 2023-10-07 ENCOUNTER — Emergency Department (HOSPITAL_COMMUNITY)
Admission: EM | Admit: 2023-10-07 | Discharge: 2023-10-07 | Disposition: A | Discharge status: Home / Self Care | Attending: Emergency Medicine | Admitting: Emergency Medicine

## 2023-10-07 ENCOUNTER — Emergency Department (HOSPITAL_COMMUNITY)

## 2023-10-07 ENCOUNTER — Other Ambulatory Visit: Payer: Self-pay

## 2023-10-07 ENCOUNTER — Encounter (HOSPITAL_COMMUNITY): Payer: Self-pay | Admitting: Emergency Medicine

## 2023-10-07 DIAGNOSIS — Z7982 Long term (current) use of aspirin: Secondary | ICD-10-CM | POA: Insufficient documentation

## 2023-10-07 DIAGNOSIS — M545 Low back pain, unspecified: Secondary | ICD-10-CM | POA: Diagnosis present

## 2023-10-07 DIAGNOSIS — S39012A Strain of muscle, fascia and tendon of lower back, initial encounter: Secondary | ICD-10-CM | POA: Insufficient documentation

## 2023-10-07 DIAGNOSIS — Z720 Tobacco use: Secondary | ICD-10-CM | POA: Diagnosis not present

## 2023-10-07 DIAGNOSIS — I509 Heart failure, unspecified: Secondary | ICD-10-CM | POA: Insufficient documentation

## 2023-10-07 DIAGNOSIS — X500XXA Overexertion from strenuous movement or load, initial encounter: Secondary | ICD-10-CM | POA: Diagnosis not present

## 2023-10-07 DIAGNOSIS — R001 Bradycardia, unspecified: Secondary | ICD-10-CM | POA: Insufficient documentation

## 2023-10-07 DIAGNOSIS — Z8673 Personal history of transient ischemic attack (TIA), and cerebral infarction without residual deficits: Secondary | ICD-10-CM | POA: Insufficient documentation

## 2023-10-07 DIAGNOSIS — M5442 Lumbago with sciatica, left side: Secondary | ICD-10-CM | POA: Diagnosis not present

## 2023-10-07 DIAGNOSIS — M5432 Sciatica, left side: Secondary | ICD-10-CM

## 2023-10-07 MED ORDER — KETOROLAC TROMETHAMINE 15 MG/ML IJ SOLN
15.0000 mg | Freq: Once | INTRAMUSCULAR | Status: AC
  Administered 2023-10-07: 15 mg via INTRAMUSCULAR
  Filled 2023-10-07: qty 1

## 2023-10-07 MED ORDER — METHOCARBAMOL 500 MG PO TABS: 500.0000 mg | ORAL_TABLET | Freq: Once | ORAL | Status: DC

## 2023-10-07 MED ORDER — LIDOCAINE 5 % EX PTCH: 1.0000 | MEDICATED_PATCH | CUTANEOUS | 0 refills | Status: AC

## 2023-10-07 MED ORDER — METHOCARBAMOL 500 MG PO TABS: 500.0000 mg | ORAL_TABLET | Freq: Two times a day (BID) | ORAL | 0 refills | Status: AC

## 2023-10-07 NOTE — ED Provider Notes (Signed)
  EMERGENCY DEPARTMENT AT [facility] Provider Note   CSN: [id number] Arrival date & time: 10/07/23  0720     History  Chief Complaint  Patient presents with   Back Pain    David [name] is a 58 y.o. male with past medical history significant for CHF, previous stroke, tobacco abuse who presents with left lower back pain radiating to left groin for a few days after lifting heavy boxes.  Worse with movement, denies any fever, recent injury, fall.  No history of IV drug use, chronic steroid use, cancer.  No numbness in groin, no loss of control of bowels, bladder function.   Back Pain      Home Medications Prior to Admission medications   Medication Sig Start Date End Date Taking? Authorizing Provider  lidocaine  (LIDODERM ) 5 % Place 1 patch onto the skin daily. Remove & Discard patch within 12 hours or as directed by MD 10/07/23  Yes Abdulraheem Pineo H, PA-C  methocarbamol (ROBAXIN) 500 MG tablet Take 1 tablet (500 mg total) by mouth 2 (two) times daily. 10/07/23  Yes Gabrelle Roca H, PA-C  Acetaminophen  (TYLENOL  PO) Take by mouth.    [provider]  amoxicillin  (AMOXIL ) 875 MG tablet Take 1 tablet (875 mg total) by mouth 2 (two) times daily. 04/10/23   Buena Carmine, NP  aspirin  EC 81 MG tablet Take 1 tablet (81 mg total) by mouth daily. 11/12/19   Tammie Fall, MD  atorvastatin  (LIPITOR ) 80 MG tablet TAKE 1 TABLET BY MOUTH ONCE DAILY A 6PM 03/29/23   Lenise Quince, MD  benzonatate  (TESSALON ) 100 MG capsule Take 2 capsules (200 mg total) by mouth 3 (three) times daily as needed for cough. 04/10/23   Buena Carmine, NP  doxycycline  (VIBRAMYCIN ) 100 MG capsule Take 1 capsule (100 mg total) by mouth 2 (two) times daily. 09/14/23   Coretha Dew, PA-C  famotidine  (PEPCID ) 40 MG tablet Take 1 tablet (40 mg total) by mouth every 12 (twelve) hours as needed for heartburn or indigestion. 06/07/22   Elois Hair, MD  ondansetron  (ZOFRAN ) 4  MG tablet Take 1 tablet (4 mg total) by mouth every 8 (eight) hours as needed for nausea or vomiting. 04/10/23   Buena Carmine, NP  sacubitril -valsartan  (ENTRESTO ) 24-26 MG Take 1 tablet by mouth 2 (two) times daily. 06/15/23   Lenise Quince, MD  sacubitril -valsartan  (ENTRESTO ) 24-26 MG Take 1 tablet by mouth 2 (two) times daily. 06/30/23   Lenise Quince, MD      Allergies    Patient has no known allergies.    Review of Systems   Review of Systems  Musculoskeletal:  Positive for back pain.  All other systems reviewed and are negative.   Physical Exam Updated Vital Signs BP (!) 152/86 (BP Location: Right Arm)   Pulse (!) 54   Temp 97.9 F (36.6 C)   Resp 15   Ht 5\' 11"  (1.803 m)   Wt 77.1 kg   SpO2 100%   BMI 23.71 kg/m  Physical Exam Vitals and nursing note reviewed.  Constitutional:      General: He is not in acute distress.    Appearance: Normal appearance.  HENT:     Head: Normocephalic and atraumatic.  Eyes:     General:        Right eye: No discharge.        Left eye: No discharge.  Cardiovascular:     Rate  and Rhythm: Regular rhythm. Bradycardia present.     Pulses: Normal pulses.     Heart sounds: No murmur heard.    No friction rub. No gallop.  Pulmonary:     Effort: Pulmonary effort is normal.     Breath sounds: Normal breath sounds.  Abdominal:     General: Bowel sounds are normal.     Palpations: Abdomen is soft.  Musculoskeletal:     Comments: Focal tenderness to palpation of the left lumbar paraspinous muscles, with some radiation to the groin.  But intact strength of bilateral upper and lower extremities, normal sensation throughout.  No significant midline spinal tenderness.  No step-off, deformity.  No overlying skin changes.  Can ambulate without significant difficulty, only some mild pain.  Skin:    General: Skin is warm and dry.     Capillary Refill: Capillary refill takes less than 2 seconds.  Neurological:     Mental Status: He is  alert and oriented to person, place, and time.  Psychiatric:        Mood and Affect: Mood normal.        Behavior: Behavior normal.     ED Results / Procedures / Treatments   Labs (all labs ordered are listed, but only abnormal results are displayed) Labs Reviewed - No data to display  EKG None  Radiology DG Lumbar Spine Complete Result Date: 10/07/2023 CLINICAL DATA:  back pain EXAM: LUMBAR SPINE - COMPLETE 4+ VIEW COMPARISON:  CT 07/29/2021 FINDINGS: There is no evidence of lumbar spine fracture. Alignment is normal. Intervertebral disc spaces are maintained. Small anterior endplate spurs at all lumbar levels. Aortic Atherosclerosis (ICD10-170.0). IMPRESSION: 1. No acute findings. 2. Mild multilevel endplate spurring. Electronically Signed   By: Nicoletta Barrier M.D.   On: 10/07/2023 12:44    Procedures Procedures    Medications Ordered in ED Medications  methocarbamol (ROBAXIN) tablet 500 mg (500 mg Oral Patient Refused/Not Given 10/07/23 1226)  ketorolac (TORADOL) 15 MG/ML injection 15 mg (15 mg Intramuscular Given 10/07/23 1210)    ED Course/ Medical Decision Making/ A&P                                 Medical Decision Making Amount and/or Complexity of Data Reviewed Radiology: ordered.  Risk Prescription drug management.   Patient with back pain.  My emergent differential diagnosis includes slipped disc, compression fracture, spondylolisthesis, less clinical concern for epidural abscess or osteomyelitis based on patient history.  Overall with high clinical suspicion for lumbar sprain, sciatica based on clinical presentation, risk factors.  No neurological deficits. Patient is ambulatory. No warning symptoms of back pain including: fecal incontinence, urinary retention or overflow incontinence, night sweats, waking from sleep with back pain, unexplained fevers or weight loss, h/o cancer, IVDU, recent trauma. Overall low clinical concern for cauda equina, epidural abscess, or other  serious cause of back pain.  Given his significant worsening of symptoms I think that plain film radiograph is warranted to further evaluate, no evidence of acute neurologic emergency to necessitate MR of the back, and pain fairly easily controlled with medications so I do not see an indication for CT of the lumbar spine at this time.  Plain film x-ray with some degenerative endplate changes, otherwise benign without significant abnormality.  Conservative measures such as rest, ice/heat, ibuprofen , Tylenol , and  prescription for Robaxin indicated with orthopedic follow-up if no improvement with conservative management.  Extensive  return precautions given, patient discharged in stable condition at this time. Final Clinical Impression(s) / ED Diagnoses Final diagnoses:  Strain of lumbar region, initial encounter  Sciatica of left side    Rx / DC Orders ED Discharge Orders          Ordered    methocarbamol (ROBAXIN) 500 MG tablet  2 times daily        10/07/23 1339    lidocaine  (LIDODERM ) 5 %  Every 24 hours        10/07/23 1339              Shakeema Lippman, Cosimo Diones, PA-C 10/07/23 1339    Nicklas Barns, MD 10/08/23 [phone]

## 2023-10-07 NOTE — Discharge Instructions (Addendum)

## 2023-10-07 NOTE — ED Notes (Signed)
 Patient transported to X-ray

## 2023-10-07 NOTE — ED Triage Notes (Signed)
 Pt arrives via POV from home with back pain after lifting heavy boxes the past few days. Pain is worse with movement, denies fevers, recent injury/fall.

## 2023-12-01 ENCOUNTER — Encounter (HOSPITAL_COMMUNITY): Payer: Self-pay | Admitting: Interventional Radiology

## 2023-12-05 NOTE — Progress Notes (Signed)
 HPI: Follow-up nonischemic cardiomyopathy and chronic systolic congestive heart failure.  Patient admitted March 2021 with CVA. CTA of his head and neck showed high-grade stenosis in the inferior division of the proximal left MCA M2 branch and high-grade stenosis in the mid to distal posterior left MCA M2 branch.  A 7 mm aneurysm arising from his left ICA was also noted.  An MRI showed acute left MCA branch vessel infarctions consistent with embolic stroke.  Echocardiogram at that time showed ejection fraction 20 to 25%, grade 2 diastolic dysfunction and moderate mitral regurgitation.  Patient was treated medically.  TEE showed ejection fraction 20 to 25%, patent foramen ovale.  Cardiac MRI March 2021 showed ejection fraction 20%, mild mitral and tricuspid regurgitation.  Findings felt suspicious for ischemic cardiomyopathy.  It was felt that thrombus may have been the cause of his CVA and he was anticoagulated.  Cardiac CTA March 2021 showed calcium  score of 0 and no coronary disease.  Last echocardiogram June 2024 showed ejection fraction 45%, mild left ventricular enlargement, mild left ventricular hypertrophy, mild mitral regurgitation.  Since last seen patient denies dyspnea, chest pain, palpitations or syncope.  He was having problems with dizziness but his Coreg  was discontinued with resolution.  Current Outpatient Medications  Medication Sig Dispense Refill   Acetaminophen  (TYLENOL  PO) Take by mouth.     amoxicillin  (AMOXIL ) 875 MG tablet Take 1 tablet (875 mg total) by mouth 2 (two) times daily. 20 tablet 0   aspirin  EC 81 MG tablet Take 1 tablet (81 mg total) by mouth daily. 90 tablet 3   atorvastatin  (LIPITOR ) 80 MG tablet TAKE 1 TABLET BY MOUTH ONCE DAILY A 6PM 30 tablet 6   benzonatate  (TESSALON ) 100 MG capsule Take 2 capsules (200 mg total) by mouth 3 (three) times daily as needed for cough. 40 capsule 0   doxycycline  (VIBRAMYCIN ) 100 MG capsule Take 1 capsule (100 mg total) by mouth 2  (two) times daily. 28 capsule 0   famotidine  (PEPCID ) 40 MG tablet Take 1 tablet (40 mg total) by mouth every 12 (twelve) hours as needed for heartburn or indigestion. 30 tablet 3   lidocaine  (LIDODERM ) 5 % Place 1 patch onto the skin daily. Remove & Discard patch within 12 hours or as directed by MD 30 patch 0   methocarbamol  (ROBAXIN ) 500 MG tablet Take 1 tablet (500 mg total) by mouth 2 (two) times daily. 20 tablet 0   ondansetron  (ZOFRAN ) 4 MG tablet Take 1 tablet (4 mg total) by mouth every 8 (eight) hours as needed for nausea or vomiting. 20 tablet 0   sacubitril -valsartan  (ENTRESTO ) 24-26 MG Take 1 tablet by mouth 2 (two) times daily. 28 tablet 0   sacubitril -valsartan  (ENTRESTO ) 24-26 MG Take 1 tablet by mouth 2 (two) times daily. 180 tablet 1   No current facility-administered medications for this visit.   Facility-Administered Medications Ordered in Other Visits  Medication Dose Route Frequency Provider Last Rate Last Admin   nitroGLYCERIN  200 mcg in sodium chloride  (PF) 0.9 % 59.74 mL (25 mcg/mL) syringe   Intra-arterial PRN Deveshwar, Sanjeev, MD   200 mcg at 12/02/22 1410     Past Medical History:  Diagnosis Date   Acute ischemic left MCA stroke (HCC)    a. 08/2019 MRI: Acute L MCA branch vessel infarctions; b. 08/2019 CTA head/neck: Patent bilat carotids/vertebrals. High grade stenosis - inf division of prox L MCA M2 branch. additional high-grade mid-dist superior L MCA M2 branch occlusion. 7mm  aneurysm arising from cavernous L ICA.   Cardiomyopathy (HCC)    a. 08/2019 Echo: EF 20-25%, global HK. Gr2 DD. Nl RV fxn. RVSP 30.52mmHg.  Mildly dil LA. Mod MR.   Cerebral aneurysm    a. 08/2019 CTA Head: 7mm aneurysm arising from the cavernous L ICA.   CHF (congestive heart failure) (HCC)    CKD stage G2/A2, GFR 60-89 and albumin creatinine ratio 30-299 mg/g    Facial mass    a. CT head: 7.0 x 2.3 cm soft tissue mass along lat aspect of L temporal bone, involving L ext ear - highly  suscpicious for malignancy.   Heart attack (HCC)    Heart murmur    Hypertension    Moderate mitral regurgitation    Nonischemic cardiomyopathy (HCC)    PFO (patent foramen ovale)    Stroke (HCC)    Tobacco abuse     Past Surgical History:  Procedure Laterality Date   BUBBLE STUDY  08/12/2019   Procedure: BUBBLE STUDY;  Surgeon: Loni Soyla LABOR, MD;  Location[facility] ENDOSCOPY;  Service: Cardiology;;   COLONOSCOPY WITH PROPOFOL  N/A 01/10/2023   Procedure: COLONOSCOPY WITH PROPOFOL ;  Surgeon: Stacia Glendia BRAVO, MD;  Location: [facility] ENDOSCOPY;  Service: Gastroenterology;  Laterality: N/A;   EXTERNAL EAR SURGERY     IR ANGIO INTRA EXTRACRAN SEL COM CAROTID INNOMINATE BILAT MOD SED  12/02/2022   IR ANGIO VERTEBRAL SEL VERTEBRAL BILAT MOD SED  12/02/2022   IR US  GUIDE VASC ACCESS RIGHT  12/02/2022   POLYPECTOMY  01/10/2023   Procedure: POLYPECTOMY;  Surgeon: Stacia Glendia BRAVO, MD;  Location: [facility] ENDOSCOPY;  Service: Gastroenterology;;   TEE WITHOUT CARDIOVERSION N/A 08/12/2019   Procedure: TRANSESOPHAGEAL ECHOCARDIOGRAM (TEE);  Surgeon: Loni Soyla LABOR, MD;  Location: [facility] ENDOSCOPY;  Service: Cardiology;  Laterality: N/A;    Social History   Socioeconomic History   Marital status: Single    Spouse name: Not on file   Number of children: 6   Years of education: Not on file   Highest education level: Not on file  Occupational History   Not on file  Tobacco Use   Smoking status: Every Day    Current packs/day: 0.25    Average packs/day: 0.3 packs/day for 35.0 years (8.8 ttl pk-yrs)    Types: Cigarettes   Smokeless tobacco: Never  Vaping Use   Vaping status: Never Used  Substance and Sexual Activity   Alcohol use: No   Drug use: Yes    Types: Marijuana    Comment: smokes several blunts on the weekend.   Sexual activity: Yes  Other Topics Concern   Not on file  Social History Narrative   ** Merged History Encounter **       Lives in [place] w/ fiancee.  Does not routinely exercise but  has very busy work-life, working M-F 12 hrs shifts and another 8 hrs on Saturdays.  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   Social Drivers of Corporate investment banker Strain: Not on file  Food Insecurity: No Food Insecurity (03/28/2022)   Received [facility]   Hunger Vital Sign    Worried About Running Out of Food in the Last Year: Never true    Ran Out of Food in the Last Year: Never true  Transportation Needs: Not on file  Physical Activity: Not on file  Stress: Not on file  Social Connections: Unknown (03/11/2022)   Received from [facility]   Social  Network    Social Network: Not on file  Intimate Partner Violence: Unknown (03/11/2022)   Received from [facility]   HITS    Physically Hurt: Not on file    Insult or Talk Down To: Not on file    Threaten Physical Harm: Not on file    Scream or Curse: Not on file    Family History  Problem Relation Age of Onset   Stroke Sister    Cancer Mother        died @ 66   Other Father        died of CO poisoning as a young man.    ROS: no fevers or chills, productive cough, hemoptysis, dysphasia, odynophagia, melena, hematochezia, dysuria, hematuria, rash, seizure activity, orthopnea, PND, pedal edema, claudication. Remaining systems are negative.  Physical Exam: Well-developed well-nourished in no acute distress.  Skin is warm and dry.  HEENT is normal.  Neck is supple.  Chest is clear to auscultation with normal expansion.  Cardiovascular exam is regular rate and rhythm.  Abdominal exam nontender or distended. No masses palpated. Extremities show no edema. neuro grossly intact  EKG Interpretation Date/Time:  Tuesday December 12 2023 16:26:10 EDT Ventricular Rate:  75 PR Interval:  166 QRS Duration:  92 QT Interval:  388 QTC Calculation: 433 R Axis:   -27  Text Interpretation: Normal sinus rhythm Septal infarct , age undetermined Confirmed by Pietro Rogue ([id number]) on 12/12/2023 4:27:54  PM    A/P  1 nonischemic cardiomyopathy-continue present dose of Entresto .  Coreg  recently discontinued secondary to dizziness and his symptoms have resolved.  Patient previously did not tolerate either Farxiga  or spironolactone .  LV function mildly reduced on most recent echo.  Will repeat study.  2 hypertension-blood pressure controlled.  Continue present medical regimen.  Check potassium and renal function.  3 hyperlipidemia-continue statin.  Check lipids and liver.  4 history of syncope-previously felt secondary to orthostasis.  No recurrences.  5 history of aneurysm in the cavernous left internal carotid artery-patient will follow-up with Dr. Myrtis.   6 tobacco abuse-patient counseled on discontinuing.  7 prior CVA-continue aspirin  and statin.  Rogue Pietro, MD

## 2023-12-12 ENCOUNTER — Ambulatory Visit: Attending: Cardiology | Admitting: Cardiology

## 2023-12-12 ENCOUNTER — Encounter: Payer: Self-pay | Admitting: Cardiology

## 2023-12-12 VITALS — BP 116/80 | HR 75 | Ht 71.0 in | Wt 155.0 lb

## 2023-12-12 DIAGNOSIS — I10 Essential (primary) hypertension: Secondary | ICD-10-CM

## 2023-12-12 DIAGNOSIS — Z720 Tobacco use: Secondary | ICD-10-CM

## 2023-12-12 DIAGNOSIS — I420 Dilated cardiomyopathy: Secondary | ICD-10-CM

## 2023-12-12 DIAGNOSIS — E785 Hyperlipidemia, unspecified: Secondary | ICD-10-CM

## 2023-12-12 NOTE — Patient Instructions (Signed)
 Medication Instructions:   NO CHANGE  *If you need a refill on your cardiac medications before your next appointment, please call your pharmacy*  Lab Work:  Your physician recommends that you return for lab work FASTING  If you have labs (blood work) drawn today and your tests are completely normal, you will receive your results only by: MyChart Message (if you have MyChart) OR A paper copy in the mail If you have any lab test that is abnormal or we need to change your treatment, we will call you to review the results.  Testing/Procedures:  Your physician has requested that you have an echocardiogram. Echocardiography is a painless test that uses sound waves to create images of your heart. It provides your doctor with information about the size and shape of your heart and how well your heart's chambers and valves are working. This procedure takes approximately one hour. There are no restrictions for this procedure. Please do NOT wear cologne, perfume, aftershave, or lotions (deodorant is allowed). Please arrive 15 minutes prior to your appointment time.  Please note: We ask at that you not bring children with you during ultrasound (echo/ vascular) testing. Due to room size and safety concerns, children are not allowed in the ultrasound rooms during exams. Our front office staff cannot provide observation of children in our lobby area while testing is being conducted. An adult accompanying a patient to their appointment will only be allowed in the ultrasound room at the discretion of the ultrasound technician under special circumstances. We apologize for any inconvenience. [address]   Follow-Up: At [facility], you and your health needs are our priority.  As part of our continuing mission to provide you with exceptional heart care, our providers are all part of one team.  This team includes your primary Cardiologist (physician) and Advanced Practice Providers or APPs  (Physician Assistants and Nurse Practitioners) who all work together to provide you with the care you need, when you need it.  Your next appointment:   6 month(s)  Provider:   Redell Shallow, MD

## 2023-12-16 LAB — COMPREHENSIVE METABOLIC PANEL WITH GFR
ALT: 5 IU/L (ref 0–44)
AST: 22 IU/L (ref 0–40)
Albumin: 4.2 g/dL (ref 3.8–4.9)
Alkaline Phosphatase: 96 IU/L (ref 44–121)
BUN/Creatinine Ratio: 10 (ref 9–20)
BUN: 11 mg/dL (ref 6–24)
Bilirubin Total: 0.4 mg/dL (ref 0.0–1.2)
CO2: 19 mmol/L — ABNORMAL LOW (ref 20–29)
Calcium: 9.2 mg/dL (ref 8.7–10.2)
Chloride: 105 mmol/L (ref 96–106)
Creatinine, Ser: 1.12 mg/dL (ref 0.76–1.27)
Globulin, Total: 2.9 g/dL (ref 1.5–4.5)
Glucose: 94 mg/dL (ref 70–99)
Potassium: 4.5 mmol/L (ref 3.5–5.2)
Sodium: 143 mmol/L (ref 134–144)
Total Protein: 7.1 g/dL (ref 6.0–8.5)
eGFR: 76 mL/min/1.73 (ref >59)

## 2023-12-16 LAB — LIPID PANEL
Chol/HDL Ratio: 4 ratio (ref 0.0–5.0)
Cholesterol, Total: 167 mg/dL (ref 100–199)
HDL: 42 mg/dL (ref >39)
LDL Chol Calc (NIH): 111 mg/dL — ABNORMAL HIGH (ref 0–99)
Triglycerides: 73 mg/dL (ref 0–149)
VLDL Cholesterol Cal: 14 mg/dL (ref 5–40)

## 2023-12-17 ENCOUNTER — Ambulatory Visit: Payer: Self-pay | Admitting: Cardiology

## 2023-12-17 DIAGNOSIS — E785 Hyperlipidemia, unspecified: Secondary | ICD-10-CM

## 2023-12-25 MED ORDER — EZETIMIBE 10 MG PO TABS: 10.0000 mg | ORAL_TABLET | Freq: Every day | ORAL | 3 refills | Status: AC

## 2024-01-22 ENCOUNTER — Ambulatory Visit (HOSPITAL_COMMUNITY)
Admission: RE | Admit: 2024-01-22 | Discharge: 2024-01-22 | Disposition: A | Discharge status: Home / Self Care | Source: Ambulatory Visit | Attending: Cardiology | Admitting: Cardiology

## 2024-01-22 DIAGNOSIS — I420 Dilated cardiomyopathy: Secondary | ICD-10-CM | POA: Diagnosis present

## 2024-01-22 LAB — ECHOCARDIOGRAM COMPLETE
Area-P 1/2: 3.85 cm2
MV M vel: 6.01 m/s
MV Peak grad: 144.5 mmHg
Radius: 0.45 cm
S' Lateral: 5 cm

## 2024-01-25 NOTE — Telephone Encounter (Signed)
 David [name] is returning call

## 2024-01-26 ENCOUNTER — Telehealth: Payer: Self-pay | Admitting: Cardiology

## 2024-01-26 NOTE — Telephone Encounter (Signed)
Pt's spouse is requesting a callback regarding results. Please advise

## 2024-01-26 NOTE — Telephone Encounter (Signed)
 Called and spoke with patient. The patient has been notified of the result from echocardiogram and verbalized understanding.  All questions (if any) were answered. Patient inquired about when his next office visit was while on phone. Scheduled patient for 6 month follow up on 06/07/2024 based on after visit summary from office visit with Dr. Pietro on 12/12/2023.  Con FORBES Guys, RN 01/26/2024 4:37 PM

## 2024-03-14 ENCOUNTER — Other Ambulatory Visit: Payer: Self-pay | Admitting: Cardiology

## 2024-03-15 ENCOUNTER — Telehealth: Payer: Self-pay | Admitting: Cardiology

## 2024-03-15 MED ORDER — SACUBITRIL-VALSARTAN 24-26 MG PO TABS: 1.0000 | ORAL_TABLET | Freq: Two times a day (BID) | ORAL | 2 refills | Status: AC

## 2024-03-15 NOTE — Telephone Encounter (Signed)
*  STAT* If patient is at the pharmacy, call can be transferred to refill team.   1. Which medications need to be refilled? (please list name of each medication and dose if known) sacubitril -valsartan  (ENTRESTO ) 24-26 MG   2. Which pharmacy/location (including street and city if local pharmacy) is medication to be sent to? [facility] - [place], [place] - [address]    3. Do they need a 30 day or 90 day supply?  30 day supply  Per [name], patient is completely out of medication.

## 2024-03-15 NOTE — Telephone Encounter (Signed)
 Pt's medication was sent to pt's pharmacy as requested. Confirmation received.

## 2024-03-19 ENCOUNTER — Encounter: Payer: Self-pay | Admitting: *Deleted

## 2024-04-12 LAB — HEPATIC FUNCTION PANEL
ALT: 50 IU/L — ABNORMAL HIGH (ref 0–44)
AST: 47 IU/L — ABNORMAL HIGH (ref 0–40)
Albumin: 4.1 g/dL (ref 3.8–4.9)
Alkaline Phosphatase: 91 IU/L (ref 47–123)
Bilirubin Total: 0.8 mg/dL (ref 0.0–1.2)
Bilirubin, Direct: 0.28 mg/dL (ref 0.00–0.40)
Total Protein: 6.8 g/dL (ref 6.0–8.5)

## 2024-04-12 LAB — LIPID PANEL
Chol/HDL Ratio: 2.5 ratio (ref 0.0–5.0)
Cholesterol, Total: 102 mg/dL (ref 100–199)
HDL: 41 mg/dL (ref >39)
LDL Chol Calc (NIH): 49 mg/dL (ref 0–99)
Triglycerides: 49 mg/dL (ref 0–149)
VLDL Cholesterol Cal: 12 mg/dL (ref 5–40)

## 2024-04-18 ENCOUNTER — Telehealth: Payer: Self-pay | Admitting: Cardiology

## 2024-04-18 DIAGNOSIS — R7989 Other specified abnormal findings of blood chemistry: Secondary | ICD-10-CM

## 2024-04-18 NOTE — Telephone Encounter (Signed)
 Pt wife calling to follow up on test results. Please advise.

## 2024-04-18 NOTE — Telephone Encounter (Signed)
 Returned call, went over lab results. Orders for LFTs placed.

## 2024-05-25 ENCOUNTER — Other Ambulatory Visit: Payer: Self-pay | Admitting: Cardiology

## 2024-05-29 NOTE — Progress Notes (Signed)
 "    HPI: Follow-up nonischemic cardiomyopathy and chronic systolic congestive heart failure. Patient admitted March 2021 with CVA. CTA of his head and neck showed high-grade stenosis in the inferior division of the proximal left MCA M2 branch and high-grade stenosis in the mid to distal posterior left MCA M2 branch.  A 7 mm aneurysm arising from his left ICA was also noted.  An MRI showed acute left MCA branch vessel infarctions consistent with embolic stroke. Echocardiogram at that time showed ejection fraction 20 to 25%, grade 2 diastolic dysfunction and moderate mitral regurgitation.  Patient was treated medically.  TEE showed ejection fraction 20 to 25%, patent foramen ovale.  Cardiac MRI March 2021 showed ejection fraction 20%, mild mitral and tricuspid regurgitation.  Findings felt suspicious for ischemic cardiomyopathy.  It was felt that thrombus may have been the cause of his CVA and he was anticoagulated.  Cardiac CTA March 2021 showed calcium  score of 0 and no coronary disease.  Last echocardiogram August 2025 showed ejection fraction 45 to 50%, moderate to severe left ventricular enlargement, grade 2 diastolic dysfunction, mild left atrial enlargement, mild mitral regurgitation.  Seen recently in the emergency room with abdominal pain.  Abdominal ultrasound negative.  CT felt possibly consistent with peptic ulcer disease.  Since last seen he has some dizziness with standing but has not had syncope.  He denies dyspnea or chest pain.  His abdominal pain is not related to food by his report.  Current Outpatient Medications  Medication Sig Dispense Refill   Acetaminophen  (TYLENOL  PO) Take by mouth. (Patient not taking: Reported on 12/12/2023)     amoxicillin  (AMOXIL ) 875 MG tablet Take 1 tablet (875 mg total) by mouth 2 (two) times daily. (Patient not taking: Reported on 12/12/2023) 20 tablet 0   aspirin  EC 81 MG tablet Take 1 tablet (81 mg total) by mouth daily. 90 tablet 3   atorvastatin  (LIPITOR ) 80  MG tablet TAKE 1 TABLET BY MOUTH ONCE DAILY AT  6  PM 90 tablet 2   benzonatate  (TESSALON ) 100 MG capsule Take 2 capsules (200 mg total) by mouth 3 (three) times daily as needed for cough. (Patient not taking: Reported on 12/12/2023) 40 capsule 0   doxycycline  (VIBRAMYCIN ) 100 MG capsule Take 1 capsule (100 mg total) by mouth 2 (two) times daily. (Patient not taking: Reported on 12/12/2023) 28 capsule 0   ezetimibe  (ZETIA ) 10 MG tablet Take 1 tablet (10 mg total) by mouth daily. 90 tablet 3   famotidine  (PEPCID ) 40 MG tablet Take 1 tablet (40 mg total) by mouth every 12 (twelve) hours as needed for heartburn or indigestion. (Patient not taking: Reported on 12/12/2023) 30 tablet 3   lidocaine  (LIDODERM ) 5 % Place 1 patch onto the skin daily. Remove & Discard patch within 12 hours or as directed by MD (Patient not taking: Reported on 12/12/2023) 30 patch 0   methocarbamol  (ROBAXIN ) 500 MG tablet Take 1 tablet (500 mg total) by mouth 2 (two) times daily. (Patient not taking: Reported on 12/12/2023) 20 tablet 0   ondansetron  (ZOFRAN ) 4 MG tablet Take 1 tablet (4 mg total) by mouth every 8 (eight) hours as needed for nausea or vomiting. (Patient not taking: Reported on 12/12/2023) 20 tablet 0   sacubitril -valsartan  (ENTRESTO ) 24-26 MG Take 1 tablet by mouth 2 (two) times daily. (Patient not taking: Reported on 12/12/2023) 28 tablet 0   sacubitril -valsartan  (ENTRESTO ) 24-26 MG Take 1 tablet by mouth 2 (two) times daily. 180 tablet 2  No current facility-administered medications for this visit.   Facility-Administered Medications Ordered in Other Visits  Medication Dose Route Frequency Provider Last Rate Last Admin   nitroGLYCERIN  200 mcg in sodium chloride  (PF) 0.9 % 59.74 mL (25 mcg/mL) syringe   Intra-arterial PRN Deveshwar, Sanjeev, MD   200 mcg at 12/02/22 1410     Past Medical History:  Diagnosis Date   Acute ischemic left MCA stroke (HCC)    a. 08/2019 MRI: Acute L MCA branch vessel infarctions; b. 08/2019  CTA head/neck: Patent bilat carotids/vertebrals. High grade stenosis - inf division of prox L MCA M2 branch. additional high-grade mid-dist superior L MCA M2 branch occlusion. 7mm aneurysm arising from cavernous L ICA.   Cardiomyopathy (HCC)    a. 08/2019 Echo: EF 20-25%, global HK. Gr2 DD. Nl RV fxn. RVSP 30.41mmHg.  Mildly dil LA. Mod MR.   Cerebral aneurysm    a. 08/2019 CTA Head: 7mm aneurysm arising from the cavernous L ICA.   CHF (congestive heart failure) (HCC)    CKD stage G2/A2, GFR 60-89 and albumin creatinine ratio 30-299 mg/g    Facial mass    a. CT head: 7.0 x 2.3 cm soft tissue mass along lat aspect of L temporal bone, involving L ext ear - highly suscpicious for malignancy.   Heart attack (HCC)    Heart murmur    Hypertension    Moderate mitral regurgitation    Nonischemic cardiomyopathy (HCC)    PFO (patent foramen ovale)    Stroke (HCC)    Tobacco abuse     Past Surgical History:  Procedure Laterality Date   BUBBLE STUDY  08/12/2019   Procedure: BUBBLE STUDY;  Surgeon: Loni Soyla LABOR, MD;  Location: [facility] ENDOSCOPY;  Service: Cardiology;;   COLONOSCOPY WITH PROPOFOL  N/A 01/10/2023   Procedure: COLONOSCOPY WITH PROPOFOL ;  Surgeon: Stacia Glendia BRAVO, MD;  Location: [facility] ENDOSCOPY;  Service: Gastroenterology;  Laterality: N/A;   EXTERNAL EAR SURGERY     IR ANGIO INTRA EXTRACRAN SEL COM CAROTID INNOMINATE BILAT MOD SED  12/02/2022   IR ANGIO VERTEBRAL SEL VERTEBRAL BILAT MOD SED  12/02/2022   IR US  GUIDE VASC ACCESS RIGHT  12/02/2022   POLYPECTOMY  01/10/2023   Procedure: POLYPECTOMY;  Surgeon: Stacia Glendia BRAVO, MD;  Location: [facility] ENDOSCOPY;  Service: Gastroenterology;;   TEE WITHOUT CARDIOVERSION N/A 08/12/2019   Procedure: TRANSESOPHAGEAL ECHOCARDIOGRAM (TEE);  Surgeon: Loni Soyla LABOR, MD;  Location: [facility] ENDOSCOPY;  Service: Cardiology;  Laterality: N/A;    Social History   Socioeconomic History   Marital status: Single    Spouse name: Not on file   Number of children:  6   Years of education: Not on file   Highest education level: Not on file  Occupational History   Not on file  Tobacco Use   Smoking status: Every Day    Current packs/day: 0.25    Average packs/day: 0.3 packs/day for 35.0 years (8.8 ttl pk-yrs)    Types: Cigarettes   Smokeless tobacco: Never  Vaping Use   Vaping status: Never Used  Substance and Sexual Activity   Alcohol use: No   Drug use: Yes    Types: Marijuana    Comment: smokes several blunts on the weekend.   Sexual activity: Yes  Other Topics Concern   Not on file  Social History Narrative   ** Merged History Encounter **       Lives in [place] w/ fiancee.  Does not routinely exercise but has very busy work-life,  working M-F 12 hrs shifts and another 8 hrs on Saturdays.  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   Social Drivers of Health   Tobacco Use: High Risk (12/12/2023)   Patient History    Smoking Tobacco Use: Every Day    Smokeless Tobacco Use: Never    Passive Exposure: Not on file  Financial Resource Strain: Not on file  Food Insecurity: No Food Insecurity (03/28/2022)   Received [facility]   Epic    Within the past 12 months, you worried that your food would run out before you got the money to buy more.: Never true    Within the past 12 months, the food you bought just didn't last and you didn't have money to get more.: Never true  Transportation Needs: Not on file  Physical Activity: Not on file  Stress: Not on file  Social Connections: Not on file  Intimate Partner Violence: Not on file  Depression (EYV7-0): Not on file  Alcohol Screen: Not on file  Housing: Not on file  Utilities: Not on file  Health Literacy: Not on file    Family History  Problem Relation Age of Onset   Stroke Sister    Cancer Mother        died @ 43   Other Father        died of CO poisoning as a young man.    ROS: no fevers or chills, productive cough, hemoptysis, dysphasia, odynophagia,  melena, hematochezia, dysuria, hematuria, rash, seizure activity, orthopnea, PND, pedal edema, claudication. Remaining systems are negative.  Physical Exam: Well-developed well-nourished in no acute distress.  Skin is warm and dry.  HEENT is normal.  Neck is supple.  Chest is clear to auscultation with normal expansion.  Cardiovascular exam is regular rate and rhythm.  Abdominal exam nontender or distended. No masses palpated. Extremities show no edema. neuro grossly intact  ECG-May 30, 2024-normal sinus rhythm with inferolateral T wave inversion.  Personally reviewed  A/P  1 nonischemic cardiomyopathy-continue Entresto  at present dose.  Patient had dizziness with carvedilol  which was discontinued.  Did not tolerate Farxiga  or spironolactone .  LV function mildly reduced on most recent echocardiogram.  2 hypertension-patient's blood pressure is controlled.  Continue present medical regimen.  3 hyperlipidemia-continue statin.  4 tobacco abuse-patient again counseled on discontinuing.  5 history of CVA-continue aspirin  and statin.  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 Myrtis.  7 abdominal pain-I have asked him to follow-up with gastroenterology for this issue.  Redell Shallow, MD    "

## 2024-05-30 ENCOUNTER — Other Ambulatory Visit: Payer: Self-pay

## 2024-05-30 ENCOUNTER — Emergency Department (HOSPITAL_COMMUNITY)

## 2024-05-30 ENCOUNTER — Emergency Department (HOSPITAL_COMMUNITY): Admission: EM | Admit: 2024-05-30 | Discharge: 2024-05-30 | Disposition: A | Discharge status: Home / Self Care

## 2024-05-30 ENCOUNTER — Encounter (HOSPITAL_COMMUNITY): Payer: Self-pay

## 2024-05-30 DIAGNOSIS — R109 Unspecified abdominal pain: Secondary | ICD-10-CM

## 2024-05-30 DIAGNOSIS — Z7982 Long term (current) use of aspirin: Secondary | ICD-10-CM | POA: Diagnosis not present

## 2024-05-30 DIAGNOSIS — N189 Chronic kidney disease, unspecified: Secondary | ICD-10-CM | POA: Diagnosis not present

## 2024-05-30 DIAGNOSIS — R1011 Right upper quadrant pain: Secondary | ICD-10-CM | POA: Insufficient documentation

## 2024-05-30 LAB — COMPREHENSIVE METABOLIC PANEL WITH GFR
ALT: 9 U/L (ref 0–44)
AST: 28 U/L (ref 15–41)
Albumin: 3.8 g/dL (ref 3.5–5.0)
Alkaline Phosphatase: 83 U/L (ref 38–126)
Anion gap: 9 (ref 5–15)
BUN: 17 mg/dL (ref 6–20)
CO2: 27 mmol/L (ref 22–32)
Calcium: 9.1 mg/dL (ref 8.9–10.3)
Chloride: 103 mmol/L (ref 98–111)
Creatinine, Ser: 1.16 mg/dL (ref 0.61–1.24)
GFR, Estimated: >60 mL/min
Glucose, Bld: 107 mg/dL — ABNORMAL HIGH (ref 70–99)
Potassium: 4 mmol/L (ref 3.5–5.1)
Sodium: 139 mmol/L (ref 135–145)
Total Bilirubin: 0.4 mg/dL (ref 0.0–1.2)
Total Protein: 7 g/dL (ref 6.5–8.1)

## 2024-05-30 LAB — CBC WITH DIFFERENTIAL/PLATELET
Abs Immature Granulocytes: 0.02 K/uL (ref 0.00–0.07)
Basophils Absolute: 0 K/uL (ref 0.0–0.1)
Basophils Relative: 1 %
Eosinophils Absolute: 0.5 K/uL (ref 0.0–0.5)
Eosinophils Relative: 8 %
HCT: 41.4 % (ref 39.0–52.0)
Hemoglobin: 14 g/dL (ref 13.0–17.0)
Immature Granulocytes: 0 %
Lymphocytes Relative: 35 %
Lymphs Abs: 2.1 K/uL (ref 0.7–4.0)
MCH: 32.7 pg (ref 26.0–34.0)
MCHC: 33.8 g/dL (ref 30.0–36.0)
MCV: 96.7 fL (ref 80.0–100.0)
Monocytes Absolute: 0.6 K/uL (ref 0.1–1.0)
Monocytes Relative: 10 %
Neutro Abs: 2.8 K/uL (ref 1.7–7.7)
Neutrophils Relative %: 46 %
Platelets: 211 K/uL (ref 150–400)
RBC: 4.28 MIL/uL (ref 4.22–5.81)
RDW: 11.9 % (ref 11.5–15.5)
WBC: 6 K/uL (ref 4.0–10.5)
nRBC: 0 % (ref 0.0–0.2)

## 2024-05-30 LAB — TROPONIN T, HIGH SENSITIVITY: Troponin T High Sensitivity: <15 ng/L (ref 0–19)

## 2024-05-30 LAB — LIPASE, BLOOD: Lipase: 21 U/L (ref 11–51)

## 2024-05-30 MED ORDER — ONDANSETRON 4 MG PO TBDP: 4.0000 mg | ORAL_TABLET | Freq: Three times a day (TID) | ORAL | 0 refills | Status: AC | PRN

## 2024-05-30 MED ORDER — ONDANSETRON HCL 4 MG/2ML IJ SOLN
4.0000 mg | Freq: Once | INTRAMUSCULAR | Status: AC
  Administered 2024-05-30: 4 mg via INTRAVENOUS
  Filled 2024-05-30: qty 2

## 2024-05-30 NOTE — ED Triage Notes (Signed)
 Pt to er, pt states that he is here for abd pain for the past two days, states that he tried to go to urgent care and the wait was too long, pt states that he has some vomiting after he eats.  States that depending on what he eats it sometimes makes his pain worse.

## 2024-05-30 NOTE — ED Notes (Signed)
 EDP at [facility]

## 2024-05-30 NOTE — ED Provider Notes (Signed)
 " [place] EMERGENCY DEPARTMENT AT [facility] Provider Note   CSN: [id number] Arrival date & time: 05/30/24  9279     Patient presents with: Abdominal Pain   David [name] is a 58 y.o. male.   Is a 58 year old male presenting emergency department with epigastric and right upper quadrant abdominal pain x 3 days.  Did have some runny nose, cough as well.  Describes the pain as sharp.  Worsened with food.  Associated with nausea and vomiting.  Would feel better after he vomited.  Reports he is currently asymptomatic and is not having symptoms but wanted to be sure he is okay.  Not having chest pain or shortness of breath.  Has no prior abdominal surgeries.  No blood in vomit, having normal bowel movements.   Abdominal Pain      Prior to Admission medications  Medication Sig Start Date End Date Taking? Authorizing Provider  Acetaminophen  (TYLENOL  PO) Take by mouth. Patient not taking: Reported on 12/12/2023    [provider]  amoxicillin  (AMOXIL ) 875 MG tablet Take 1 tablet (875 mg total) by mouth 2 (two) times daily. Patient not taking: Reported on 12/12/2023 04/10/23   Arloa Suzen RAMAN, NP  aspirin  EC 81 MG tablet Take 1 tablet (81 mg total) by mouth daily. 11/12/19   Waddell Danelle ORN, MD  atorvastatin  (LIPITOR ) 80 MG tablet TAKE 1 TABLET BY MOUTH ONCE DAILY AT  6  PM 05/27/24   Pietro Redell RAMAN, MD  benzonatate  (TESSALON ) 100 MG capsule Take 2 capsules (200 mg total) by mouth 3 (three) times daily as needed for cough. Patient not taking: Reported on 12/12/2023 04/10/23   Arloa Suzen RAMAN, NP  doxycycline  (VIBRAMYCIN ) 100 MG capsule Take 1 capsule (100 mg total) by mouth 2 (two) times daily. Patient not taking: Reported on 12/12/2023 09/14/23   Jarold Olam HERO, PA-C  ezetimibe  (ZETIA ) 10 MG tablet Take 1 tablet (10 mg total) by mouth daily. 12/25/23   Pietro Redell RAMAN, MD  famotidine  (PEPCID ) 40 MG tablet Take 1 tablet (40 mg total) by mouth every 12 (twelve) hours as  needed for heartburn or indigestion. Patient not taking: Reported on 12/12/2023 06/07/22   Stacia Glendia BRAVO, MD  lidocaine  (LIDODERM ) 5 % Place 1 patch onto the skin daily. Remove & Discard patch within 12 hours or as directed by MD Patient not taking: Reported on 12/12/2023 10/07/23   Prosperi, Christian H, PA-C  methocarbamol  (ROBAXIN ) 500 MG tablet Take 1 tablet (500 mg total) by mouth 2 (two) times daily. Patient not taking: Reported on 12/12/2023 10/07/23   Prosperi, Christian H, PA-C  ondansetron  (ZOFRAN ) 4 MG tablet Take 1 tablet (4 mg total) by mouth every 8 (eight) hours as needed for nausea or vomiting. Patient not taking: Reported on 12/12/2023 04/10/23   Arloa Suzen RAMAN, NP  sacubitril -valsartan  (ENTRESTO ) 24-26 MG Take 1 tablet by mouth 2 (two) times daily. Patient not taking: Reported on 12/12/2023 06/15/23   Pietro Redell RAMAN, MD  sacubitril -valsartan  (ENTRESTO ) 24-26 MG Take 1 tablet by mouth 2 (two) times daily. 03/15/24   Pietro Redell RAMAN, MD    Allergies: Patient has no known allergies.    Review of Systems  Gastrointestinal:  Positive for abdominal pain.    Updated Vital Signs BP 127/77   Pulse 66   Temp 98.4 F (36.9 C)   Resp 15   Ht 5' 11 (1.803 m)   Wt 74.8 kg   SpO2 98%   BMI 23.01  kg/m   Physical Exam Vitals and nursing note reviewed.  Constitutional:      General: He is not in acute distress.    Appearance: He is not toxic-appearing.  HENT:     Head: Normocephalic.  Cardiovascular:     Rate and Rhythm: Normal rate and regular rhythm.  Pulmonary:     Effort: Pulmonary effort is normal.     Breath sounds: Normal breath sounds.  Abdominal:     General: Abdomen is flat.     Palpations: Abdomen is soft.     Tenderness: There is no abdominal tenderness. There is no guarding or rebound.  Skin:    General: Skin is warm and dry.     Capillary Refill: Capillary refill takes less than 2 seconds.  Neurological:     Mental Status: He is alert and oriented to  person, place, and time.  Psychiatric:        Mood and Affect: Mood normal.        Behavior: Behavior normal.     (all labs ordered are listed, but only abnormal results are displayed) Labs Reviewed  COMPREHENSIVE METABOLIC PANEL WITH GFR - Abnormal; Notable for the following components:      Result Value   Glucose, Bld 107 (*)    All other components within normal limits  CBC WITH DIFFERENTIAL/PLATELET  LIPASE, BLOOD  TROPONIN T, HIGH SENSITIVITY    EKG: None  Radiology: US  Abdomen Limited RUQ (LIVER/GB) Result Date: 05/30/2024 CLINICAL DATA:  Right upper quadrant pain. EXAM: ULTRASOUND ABDOMEN LIMITED RIGHT UPPER QUADRANT COMPARISON:  CT abdomen pelvis 07/29/2021. FINDINGS: Gallbladder: No gallstones or wall thickening visualized. No sonographic Murphy sign noted by sonographer. Common bile duct: Diameter: 3 mm, within normal limits. No intrahepatic biliary ductal dilatation. Liver: Slightly increased in echogenicity. No focal lesion. Portal vein is patent on color Doppler imaging with normal direction of blood flow towards the liver. Other: None. IMPRESSION: 1. No acute findings. 2. Mild hepatic steatosis. Electronically Signed   By: Newell Eke M.D.   On: 05/30/2024 09:08     Procedures   Medications Ordered in the ED  ondansetron  (ZOFRAN ) injection 4 mg (4 mg Intravenous Given 05/30/24 0822)    Clinical Course as of 05/30/24 1033  Thu May 30, 2024  0816 US  Abdomen Limited RUQ (LIVER/GB) On my independent review of images. do not appreciate gallbladder wall thickening or common bile duct dilation on ultrasound. [TY]  0853 CBC with Differential No leukocytosis to suggest systemic infection. [TY]  [id number] US  Abdomen Limited RUQ (LIVER/GB) IMPRESSION: 1. No acute findings. 2. Mild hepatic steatosis.  Electronically Signed   [TY]  1032 Patient's workup reassuring.  EKG largely similar to prior.  Troponin negative.  Doubt ACS.  Also history most consistent with a GI  etiology.  Ultrasound without acute findings.  Lipase not elevated, pancreatitis unlikely.  ALT AST and bilirubin not elevated.  No leukocytosis to suggest systemic infection.  No anemia.  He is asymptomatic currently, not having abdominal tenderness on exam.  No acute emergent condition identified.  Will discharge in stable condition to follow-up with PCP. [TY]    Clinical Course User Index [TY] Neysa Caron PARAS, DO                                 Medical Decision Making 58 year old male complicated past medical history to include cardiomyopathy, stroke CKD presented to the emergency department with abdominal  pain.  He is afebrile nontachycardic, normotensive.  Maintaining oxygen saturation on room air.  He reports that he is essentially asymptomatic currently.  Benign abdominal exam.  Query biliary colic.  Will get screening labs and ultrasound to evaluate.  Given Zofran  for nausea.  See ED course for further MDM and final disposition.  Amount and/or Complexity of Data Reviewed External Data Reviewed:     Details: Had a CT abdomen pelvis in 2023.  Normal hepatobiliary at that time. Labs: ordered. Decision-making details documented in ED Course. Radiology: ordered and independent interpretation performed. Decision-making details documented in ED Course. ECG/medicine tests: ordered and independent interpretation performed. Decision-making details documented in ED Course.  Risk Prescription drug management. Decision regarding hospitalization. Diagnosis or treatment significantly limited by social determinants of health. Risk Details: Poor health literacy.       Final diagnoses:  Abdominal pain, unspecified abdominal location    ED Discharge Orders     None          Neysa Caron PARAS, DO 05/30/24 1033  "

## 2024-05-30 NOTE — Discharge Instructions (Signed)
 Please follow-up with your primary doctor.  Return immediately for fevers, chills, chest pain, shortness of breath, difficulty breathing, severe abdominal pain, uncontrolled nausea and vomiting, stop having bowel movements, become lightheaded, passout or he develop any new or worsening symptoms that are concerning to you.

## 2024-05-30 NOTE — ED Notes (Signed)
 Patient transported to Ultrasound

## 2024-06-02 ENCOUNTER — Other Ambulatory Visit: Payer: Self-pay

## 2024-06-02 ENCOUNTER — Encounter (HOSPITAL_COMMUNITY): Payer: Self-pay

## 2024-06-02 ENCOUNTER — Emergency Department (HOSPITAL_COMMUNITY)
Admission: EM | Admit: 2024-06-02 | Discharge: 2024-06-03 | Disposition: A | Attending: Emergency Medicine | Admitting: Emergency Medicine

## 2024-06-02 DIAGNOSIS — R1010 Upper abdominal pain, unspecified: Secondary | ICD-10-CM

## 2024-06-02 DIAGNOSIS — R1013 Epigastric pain: Secondary | ICD-10-CM | POA: Insufficient documentation

## 2024-06-02 DIAGNOSIS — K279 Peptic ulcer, site unspecified, unspecified as acute or chronic, without hemorrhage or perforation: Secondary | ICD-10-CM | POA: Diagnosis not present

## 2024-06-02 DIAGNOSIS — Z7982 Long term (current) use of aspirin: Secondary | ICD-10-CM | POA: Diagnosis not present

## 2024-06-02 DIAGNOSIS — F17200 Nicotine dependence, unspecified, uncomplicated: Secondary | ICD-10-CM | POA: Insufficient documentation

## 2024-06-02 LAB — CBC WITH DIFFERENTIAL/PLATELET
Abs Immature Granulocytes: 0.01 K/uL (ref 0.00–0.07)
Basophils Absolute: 0 K/uL (ref 0.0–0.1)
Basophils Relative: 1 %
Eosinophils Absolute: 0.3 K/uL (ref 0.0–0.5)
Eosinophils Relative: 4 %
HCT: 45.9 % (ref 39.0–52.0)
Hemoglobin: 15.7 g/dL (ref 13.0–17.0)
Immature Granulocytes: 0 %
Lymphocytes Relative: 40 %
Lymphs Abs: 2.9 K/uL (ref 0.7–4.0)
MCH: 33.3 pg (ref 26.0–34.0)
MCHC: 34.2 g/dL (ref 30.0–36.0)
MCV: 97.2 fL (ref 80.0–100.0)
Monocytes Absolute: 0.6 K/uL (ref 0.1–1.0)
Monocytes Relative: 8 %
Neutro Abs: 3.4 K/uL (ref 1.7–7.7)
Neutrophils Relative %: 47 %
Platelets: 247 K/uL (ref 150–400)
RBC: 4.72 MIL/uL (ref 4.22–5.81)
RDW: 11.6 % (ref 11.5–15.5)
WBC: 7.2 K/uL (ref 4.0–10.5)
nRBC: 0 % (ref 0.0–0.2)

## 2024-06-02 LAB — URINALYSIS, ROUTINE W REFLEX MICROSCOPIC
Bacteria, UA: NONE SEEN
Bilirubin Urine: NEGATIVE
Glucose, UA: NEGATIVE mg/dL
Hgb urine dipstick: NEGATIVE
Ketones, ur: 5 mg/dL — AB
Leukocytes,Ua: NEGATIVE
Nitrite: NEGATIVE
Protein, ur: 30 mg/dL — AB
Specific Gravity, Urine: 1.032 — ABNORMAL HIGH (ref 1.005–1.030)
pH: 6 (ref 5.0–8.0)

## 2024-06-02 LAB — COMPREHENSIVE METABOLIC PANEL WITH GFR
ALT: 7 U/L (ref 0–44)
AST: 21 U/L (ref 15–41)
Albumin: 4.3 g/dL (ref 3.5–5.0)
Alkaline Phosphatase: 97 U/L (ref 38–126)
Anion gap: 11 (ref 5–15)
BUN: 17 mg/dL (ref 6–20)
CO2: 29 mmol/L (ref 22–32)
Calcium: 9.5 mg/dL (ref 8.9–10.3)
Chloride: 99 mmol/L (ref 98–111)
Creatinine, Ser: 1.19 mg/dL (ref 0.61–1.24)
GFR, Estimated: >60 mL/min
Glucose, Bld: 100 mg/dL — ABNORMAL HIGH (ref 70–99)
Potassium: 3.8 mmol/L (ref 3.5–5.1)
Sodium: 139 mmol/L (ref 135–145)
Total Bilirubin: 0.8 mg/dL (ref 0.0–1.2)
Total Protein: 7.8 g/dL (ref 6.5–8.1)

## 2024-06-02 LAB — LIPASE, BLOOD: Lipase: 19 U/L (ref 11–51)

## 2024-06-02 NOTE — ED Provider Triage Note (Signed)
 Emergency Medicine Provider Triage Evaluation Note  David [name] , a 58 y.o. male  was evaluated in triage.  Pt complains of abdominal pain.  Describes central abdominal pain that is sharp and intermittent in nature, seems to be exacerbated by eating.  Notes constipation for 2 days, no fevers at home.  Reports daily episodes of nausea/vomiting.  No dysuria, no chest pain/shortness of breath  Review of Systems  Positive: As above Negative: As above  Physical Exam  BP 129/84 (BP Location: Right Arm)   Pulse (!) 106   Temp 98.5 F (36.9 C) (Oral)   Resp 16   Ht 5' 11 (1.803 m)   Wt 74.8 kg   SpO2 98%   BMI 23.01 kg/m  Gen:   Awake, no distress   Resp:  Normal effort  MSK:   Moves extremities without difficulty  Other:  Abdomen is soft and nontender on exam  Medical Decision Making  Medically screening exam initiated at 4:49 PM.  Appropriate orders placed.  David [name] was informed that the remainder of the evaluation will be completed by another provider, this initial triage assessment does not replace that evaluation, and the importance of remaining in the ED until their evaluation is complete.  Was seen 12/25 for same complaint   Glendia Rocky SAILOR, [place] 06/02/24 1650

## 2024-06-02 NOTE — ED Triage Notes (Signed)
 C/O abd pain on christmas and n/v. Denies diarrhea. C/O fevers. Denies CP and SHOB/. Axox4.

## 2024-06-02 NOTE — ED Triage Notes (Signed)
 Pt to ED c/o generalized abdominal pain x 4 days, recently evaluated for the same. Last BM 2 days ago, reports N/V.

## 2024-06-03 ENCOUNTER — Emergency Department (HOSPITAL_COMMUNITY)

## 2024-06-03 MED ORDER — IOHEXOL 350 MG/ML SOLN
75.0000 mL | Freq: Once | INTRAVENOUS | Status: AC | PRN
  Administered 2024-06-03: 75 mL via INTRAVENOUS

## 2024-06-03 MED ORDER — SUCRALFATE 1 G PO TABS: 1.0000 g | ORAL_TABLET | Freq: Three times a day (TID) | ORAL | 3 refills | Status: AC

## 2024-06-03 MED ORDER — PANTOPRAZOLE SODIUM 40 MG PO TBEC: 40.0000 mg | DELAYED_RELEASE_TABLET | Freq: Every day | ORAL | 3 refills | Status: AC

## 2024-06-03 NOTE — ED Provider Notes (Signed)
 " [place] [facility] DEPARTMENT AT [facility] Provider Note   CSN: [id number] Arrival date & time: 06/02/24  1627     Patient presents with: Abdominal Pain   David [name] is a 58 y.o. male.  Complaint of upper abdominal sharp pain associated with nausea and vomiting that is going on about a week.  He was here for 5 days ago for same, had normal labs, negative right upper quadrant ultrasound.  He was discharged with Zofran  which he says does not really help much.  Continues to be symptomatic and is having trouble eating.  No fevers or chills, no diarrhea or constipation.  No trauma.  He does endorse tobacco, denies alcohol or drug use.  Has a PCP but has not seen GI before.   The history is provided by the patient.  Abdominal Pain Pain location:  Epigastric Pain quality: stabbing   Pain radiates to:  Does not radiate Pain severity:  Moderate Onset quality:  Gradual Duration:  1 week Timing:  Constant Progression:  Unchanged Chronicity:  New Context: not trauma   Relieved by:  Nothing Associated symptoms: nausea and vomiting   Associated symptoms: no chest pain, no constipation, no cough, no diarrhea, no dysuria, no fever, no hematemesis and no shortness of breath        Prior to Admission medications  Medication Sig Start Date End Date Taking? Authorizing Provider  Acetaminophen  (TYLENOL  PO) Take by mouth. Patient not taking: Reported on 12/12/2023    [provider]  amoxicillin  (AMOXIL ) 875 MG tablet Take 1 tablet (875 mg total) by mouth 2 (two) times daily. Patient not taking: Reported on 12/12/2023 04/10/23   Arloa Suzen RAMAN, NP  aspirin  EC 81 MG tablet Take 1 tablet (81 mg total) by mouth daily. 11/12/19   Waddell Danelle ORN, MD  atorvastatin  (LIPITOR ) 80 MG tablet TAKE 1 TABLET BY MOUTH ONCE DAILY AT  6  PM 05/27/24   Pietro Redell RAMAN, MD  benzonatate  (TESSALON ) 100 MG capsule Take 2 capsules (200 mg total) by mouth 3 (three) times daily as needed for  cough. Patient not taking: Reported on 12/12/2023 04/10/23   Arloa Suzen RAMAN, NP  doxycycline  (VIBRAMYCIN ) 100 MG capsule Take 1 capsule (100 mg total) by mouth 2 (two) times daily. Patient not taking: Reported on 12/12/2023 09/14/23   Jarold Olam HERO, PA-C  ezetimibe  (ZETIA ) 10 MG tablet Take 1 tablet (10 mg total) by mouth daily. 12/25/23   Pietro Redell RAMAN, MD  famotidine  (PEPCID ) 40 MG tablet Take 1 tablet (40 mg total) by mouth every 12 (twelve) hours as needed for heartburn or indigestion. Patient not taking: Reported on 12/12/2023 06/07/22   Stacia Glendia BRAVO, MD  lidocaine  (LIDODERM ) 5 % Place 1 patch onto the skin daily. Remove & Discard patch within 12 hours or as directed by MD Patient not taking: Reported on 12/12/2023 10/07/23   Prosperi, Christian H, PA-C  methocarbamol  (ROBAXIN ) 500 MG tablet Take 1 tablet (500 mg total) by mouth 2 (two) times daily. Patient not taking: Reported on 12/12/2023 10/07/23   Prosperi, Christian H, PA-C  ondansetron  (ZOFRAN ) 4 MG tablet Take 1 tablet (4 mg total) by mouth every 8 (eight) hours as needed for nausea or vomiting. Patient not taking: Reported on 12/12/2023 04/10/23   Arloa Suzen RAMAN, NP  ondansetron  (ZOFRAN -ODT) 4 MG disintegrating tablet Take 1 tablet (4 mg total) by mouth every 8 (eight) hours as needed for nausea or vomiting. 05/30/24   Neysa Clap  J, DO  sacubitril -valsartan  (ENTRESTO ) 24-26 MG Take 1 tablet by mouth 2 (two) times daily. Patient not taking: Reported on 12/12/2023 06/15/23   Pietro Redell RAMAN, MD  sacubitril -valsartan  (ENTRESTO ) 24-26 MG Take 1 tablet by mouth 2 (two) times daily. 03/15/24   Pietro Redell RAMAN, MD    Allergies: Patient has no known allergies.    Review of Systems  Constitutional:  Negative for fever.  Respiratory:  Negative for cough and shortness of breath.   Cardiovascular:  Negative for chest pain.  Gastrointestinal:  Positive for abdominal pain, nausea and vomiting. Negative for constipation, diarrhea and  hematemesis.  Genitourinary:  Negative for dysuria.    Updated Vital Signs BP 119/75 (BP Location: Right Arm)   Pulse 81   Temp 98.6 F (37 C) (Oral)   Resp (!) 24   Ht 5' 11 (1.803 m)   Wt 74.8 kg   SpO2 100%   BMI 23.01 kg/m   Physical Exam Vitals and nursing note reviewed.  Constitutional:      Appearance: Normal appearance. He is well-developed.  HENT:     Head: Normocephalic and atraumatic.  Eyes:     Conjunctiva/sclera: Conjunctivae normal.  Cardiovascular:     Rate and Rhythm: Normal rate and regular rhythm.     Heart sounds: No murmur heard. Pulmonary:     Effort: Pulmonary effort is normal. No respiratory distress.     Breath sounds: Normal breath sounds.  Abdominal:     Palpations: Abdomen is soft.     Tenderness: There is no abdominal tenderness. There is no guarding or rebound.  Musculoskeletal:     Cervical back: Neck supple.  Skin:    General: Skin is warm and dry.  Neurological:     General: No focal deficit present.     Mental Status: He is alert.     GCS: GCS eye subscore is 4. GCS verbal subscore is 5. GCS motor subscore is 6.     (all labs ordered are listed, but only abnormal results are displayed) Labs Reviewed  COMPREHENSIVE METABOLIC PANEL WITH GFR - Abnormal; Notable for the following components:      Result Value   Glucose, Bld 100 (*)    All other components within normal limits  URINALYSIS, ROUTINE W REFLEX MICROSCOPIC - Abnormal; Notable for the following components:   Color, Urine AMBER (*)    APPearance HAZY (*)    Specific Gravity, Urine 1.032 (*)    Ketones, ur 5 (*)    Protein, ur 30 (*)    All other components within normal limits  LIPASE, BLOOD  CBC WITH DIFFERENTIAL/PLATELET    EKG: None  Radiology: CT ABDOMEN PELVIS W CONTRAST Result Date: 06/03/2024 EXAM: CT ABDOMEN AND PELVIS WITH CONTRAST 06/03/2024 12:07:56 PM TECHNIQUE: CT of the abdomen and pelvis was performed with the administration of 75 mL of iohexol   (OMNIPAQUE ) 350 MG/ML injection. Multiplanar reformatted images are provided for review. Automated exposure control, iterative reconstruction, and/or weight-based adjustment of the mA/kV was utilized to reduce the radiation dose to as low as reasonably achievable. COMPARISON: CT abdomen and pelvis 07/29/2021. CLINICAL HISTORY: Abdominal pain, acute, nonlocalized. FINDINGS: LOWER CHEST: Chronic nodular scarring in the posterior left lung base is unchanged. No acute abnormality at the lung bases. LIVER: The liver is unremarkable. GALLBLADDER AND BILE DUCTS: Gallbladder is unremarkable. No biliary ductal dilatation. SPLEEN: No acute abnormality. PANCREAS: No acute abnormality. ADRENAL GLANDS: No acute abnormality. KIDNEYS, URETERS AND BLADDER: Scattered subcentimeter hypodense bilateral renal cortical  lesions are too small to characterize. No stones in the kidneys or ureters. No hydronephrosis. No perinephric or periureteral stranding. Urinary bladder is unremarkable. GI AND BOWEL: There is wall thickening along the inferior gastric pylorus and duodenal bulb with surrounding fat stranding. Stomach is nondistended. Normal caliber small bowel with no small bowel wall thickening. Normal appendix. No large bowel wall thickening or significant colonic diverticulosis. PERITONEUM AND RETROPERITONEUM: No ascites. No free air. VASCULATURE: Atherosclerotic nonaneurysmal abdominal aorta. LYMPH NODES: No lymphadenopathy. REPRODUCTIVE ORGANS: No acute abnormality. BONES AND SOFT TISSUES: Mild thoracolumbar spondylosis. No acute osseous abnormality. No focal soft tissue abnormality. IMPRESSION: 1. Wall thickening along the inferior gastric pylorus and duodenal bulb with surrounding fat stranding. Findings may indicate peptic ulcer disease. No free air or abscess. 2. Aortic Atherosclerosis (ICD10-I70.0). Electronically signed by: Selinda Blue MD 06/03/2024 01:52 PM EST RP Workstation: [id number]     Procedures   Medications  Ordered in the ED  iohexol  (OMNIPAQUE ) 350 MG/ML injection 75 mL (75 mLs Intravenous Contrast Given 06/03/24 1158)                                    Medical Decision Making Amount and/or Complexity of Data Reviewed Radiology: ordered.  Risk Prescription drug management.   This patient complains of upper abdominal pain nausea vomiting; this involves an extensive number of treatment Options and is a complaint that carries with it a high risk of complications and morbidity. The differential includes gastritis, peptic ulcer disease, biliary colic, diverticulitis, colitis  I ordered, reviewed and interpreted labs, which included CBC normal chemistries normal LFTs normal urinalysis unremarkable I ordered imaging studies which included CT abdomen and pelvis and I independently    visualized and interpreted imaging which showed gastric thickening Previous records obtained and reviewed in epic, was evaluated few days ago and had a right upper quadrant ultrasound and cardiac enzymes Cardiac monitoring reviewed, sinus rhythm Social determinants considered, tobacco use Critical Interventions: None  After the interventions stated above, I reevaluated the patient and found patient to have benign exam and stable vitals Admission and further testing considered, no indications for admission.  Will start on PPI and Carafate .  Given contact information for outpatient GI.  Recommended close follow-up with PCP.  Return instructions discussed      Final diagnoses:  Upper abdominal pain  Peptic ulcer disease    ED Discharge Orders          Ordered    pantoprazole  (PROTONIX ) 40 MG tablet  Daily        06/03/24 1359    sucralfate  (CARAFATE ) 1 g tablet  3 times daily with meals & bedtime        06/03/24 1359               Towana Ozell BROCKS, MD 06/03/24 1712  "

## 2024-06-03 NOTE — Discharge Instructions (Signed)
 You were seen in the emergency department for upper abdominal pain nausea vomiting.  Your lab work was stable but your CAT scan showed thickening of your stomach and concerns for possible ulcers.  We are starting you on some acid medication and medication to help coat your stomach.  It will be important for you to follow-up with your primary care doctor.  I am also giving you the contact information for our stomach specialist.  Return to the emergency department if any worsening or concerning symptoms

## 2024-06-07 ENCOUNTER — Encounter: Payer: Self-pay | Admitting: Cardiology

## 2024-06-07 ENCOUNTER — Ambulatory Visit: Attending: Cardiology | Admitting: Cardiology

## 2024-06-07 VITALS — BP 124/60 | HR 82 | Ht 71.0 in | Wt 156.0 lb

## 2024-06-07 DIAGNOSIS — Z720 Tobacco use: Secondary | ICD-10-CM

## 2024-06-07 DIAGNOSIS — I10 Essential (primary) hypertension: Secondary | ICD-10-CM | POA: Diagnosis not present

## 2024-06-07 DIAGNOSIS — E785 Hyperlipidemia, unspecified: Secondary | ICD-10-CM | POA: Diagnosis not present

## 2024-06-07 DIAGNOSIS — I420 Dilated cardiomyopathy: Secondary | ICD-10-CM | POA: Diagnosis not present

## 2024-06-07 NOTE — Patient Instructions (Signed)

## 2024-07-08 ENCOUNTER — Ambulatory Visit: Admitting: Gastroenterology
